# Patient Record
Sex: Female | Born: 1970 | Race: White | Hispanic: No | Marital: Married | Smoking: Never smoker
Health system: Southern US, Community
[De-identification: ages and names within clinical notes are randomized; demographics above are authoritative.]

## PROBLEM LIST (undated history)

## (undated) DIAGNOSIS — I10 Essential (primary) hypertension: Secondary | ICD-10-CM

## (undated) DIAGNOSIS — I471 Supraventricular tachycardia, unspecified: Secondary | ICD-10-CM

## (undated) DIAGNOSIS — G901 Familial dysautonomia [Riley-Day]: Secondary | ICD-10-CM

## (undated) DIAGNOSIS — F32A Depression, unspecified: Secondary | ICD-10-CM

## (undated) DIAGNOSIS — I442 Atrioventricular block, complete: Secondary | ICD-10-CM

## (undated) DIAGNOSIS — F329 Major depressive disorder, single episode, unspecified: Secondary | ICD-10-CM

## (undated) DIAGNOSIS — G4700 Insomnia, unspecified: Secondary | ICD-10-CM

## (undated) DIAGNOSIS — F429 Obsessive-compulsive disorder, unspecified: Secondary | ICD-10-CM

## (undated) DIAGNOSIS — Z950 Presence of cardiac pacemaker: Secondary | ICD-10-CM

## (undated) DIAGNOSIS — R0600 Dyspnea, unspecified: Secondary | ICD-10-CM

## (undated) HISTORY — DX: Dyspnea, unspecified: R06.00

## (undated) HISTORY — PX: INSERT / REPLACE / REMOVE PACEMAKER: SUR710

## (undated) HISTORY — DX: Supraventricular tachycardia, unspecified: I47.10

## (undated) HISTORY — DX: Obsessive-compulsive disorder, unspecified: F42.9

## (undated) HISTORY — DX: Essential (primary) hypertension: I10

## (undated) HISTORY — DX: Insomnia, unspecified: G47.00

## (undated) HISTORY — PX: BREAST LUMPECTOMY: SHX2

## (undated) HISTORY — DX: Familial dysautonomia (riley-day): G90.1

## (undated) HISTORY — DX: Supraventricular tachycardia: I47.1

## (undated) HISTORY — DX: Major depressive disorder, single episode, unspecified: F32.9

## (undated) HISTORY — DX: Depression, unspecified: F32.A

---
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
## 1996-06-03 HISTORY — PX: BREAST BIOPSY: SHX20

## 1998-03-01 ENCOUNTER — Inpatient Hospital Stay (HOSPITAL_COMMUNITY): Admission: AD | Admit: 1998-03-01 | Discharge: 1998-03-04 | Payer: Self-pay | Admitting: Gynecology

## 1998-04-19 ENCOUNTER — Other Ambulatory Visit: Admission: RE | Admit: 1998-04-19 | Discharge: 1998-04-19 | Payer: Self-pay | Admitting: Obstetrics and Gynecology

## 1999-05-01 ENCOUNTER — Other Ambulatory Visit: Admission: RE | Admit: 1999-05-01 | Discharge: 1999-05-01 | Payer: Self-pay | Admitting: Obstetrics and Gynecology

## 2000-04-14 ENCOUNTER — Other Ambulatory Visit: Admission: RE | Admit: 2000-04-14 | Discharge: 2000-04-14 | Payer: Self-pay | Admitting: *Deleted

## 2001-03-06 ENCOUNTER — Inpatient Hospital Stay (HOSPITAL_COMMUNITY): Admission: AD | Admit: 2001-03-06 | Discharge: 2001-03-06 | Payer: Self-pay | Admitting: Gynecology

## 2001-03-07 ENCOUNTER — Inpatient Hospital Stay (HOSPITAL_COMMUNITY): Admission: AD | Admit: 2001-03-07 | Discharge: 2001-03-07 | Payer: Self-pay | Admitting: *Deleted

## 2001-03-09 ENCOUNTER — Inpatient Hospital Stay (HOSPITAL_COMMUNITY): Admission: AD | Admit: 2001-03-09 | Discharge: 2001-03-11 | Payer: Self-pay | Admitting: Gynecology

## 2001-04-29 ENCOUNTER — Other Ambulatory Visit: Admission: RE | Admit: 2001-04-29 | Discharge: 2001-04-29 | Payer: Self-pay | Admitting: Gynecology

## 2001-06-03 HISTORY — PX: TUBAL LIGATION: SHX77

## 2001-06-18 ENCOUNTER — Ambulatory Visit (HOSPITAL_COMMUNITY): Admission: RE | Admit: 2001-06-18 | Discharge: 2001-06-18 | Payer: Self-pay | Admitting: Gynecology

## 2001-06-29 ENCOUNTER — Inpatient Hospital Stay (HOSPITAL_COMMUNITY): Admission: AD | Admit: 2001-06-29 | Discharge: 2001-06-29 | Payer: Self-pay | Admitting: *Deleted

## 2002-06-03 HISTORY — PX: BREAST BIOPSY: SHX20

## 2002-06-24 ENCOUNTER — Other Ambulatory Visit: Admission: RE | Admit: 2002-06-24 | Discharge: 2002-06-24 | Payer: Self-pay | Admitting: Gynecology

## 2003-06-04 HISTORY — PX: ENDOMETRIAL ABLATION: SHX621

## 2003-06-04 HISTORY — PX: OTHER SURGICAL HISTORY: SHX169

## 2003-07-11 ENCOUNTER — Other Ambulatory Visit: Admission: RE | Admit: 2003-07-11 | Discharge: 2003-07-11 | Payer: Self-pay | Admitting: Gynecology

## 2003-10-04 ENCOUNTER — Other Ambulatory Visit: Payer: Self-pay

## 2004-03-12 ENCOUNTER — Observation Stay (HOSPITAL_COMMUNITY): Admission: RE | Admit: 2004-03-12 | Discharge: 2004-03-13 | Payer: Self-pay | Admitting: Gynecology

## 2004-07-16 ENCOUNTER — Other Ambulatory Visit: Admission: RE | Admit: 2004-07-16 | Discharge: 2004-07-16 | Payer: Self-pay | Admitting: Gynecology

## 2005-06-03 HISTORY — PX: INSERT / REPLACE / REMOVE PACEMAKER: SUR710

## 2005-07-17 ENCOUNTER — Emergency Department: Payer: Self-pay | Admitting: Emergency Medicine

## 2005-07-17 ENCOUNTER — Other Ambulatory Visit: Admission: RE | Admit: 2005-07-17 | Discharge: 2005-07-17 | Payer: Self-pay | Admitting: Gynecology

## 2005-08-13 ENCOUNTER — Ambulatory Visit: Payer: Self-pay | Admitting: Internal Medicine

## 2005-08-14 ENCOUNTER — Inpatient Hospital Stay (HOSPITAL_COMMUNITY): Admission: AD | Admit: 2005-08-14 | Discharge: 2005-08-20 | Payer: Self-pay | Admitting: Internal Medicine

## 2005-08-14 ENCOUNTER — Ambulatory Visit: Payer: Self-pay | Admitting: Internal Medicine

## 2005-08-15 IMAGING — CR DG CHEST 1V PORT
1 series · 1 of 1 positions shown · non-contrast
Comparison: none

CLINICAL DATA: Irregular heartbeat. 
 PORTABLE CHEST ? 1 VIEW ? [DATE] AT [F2] HOURS:

[view not recorded]
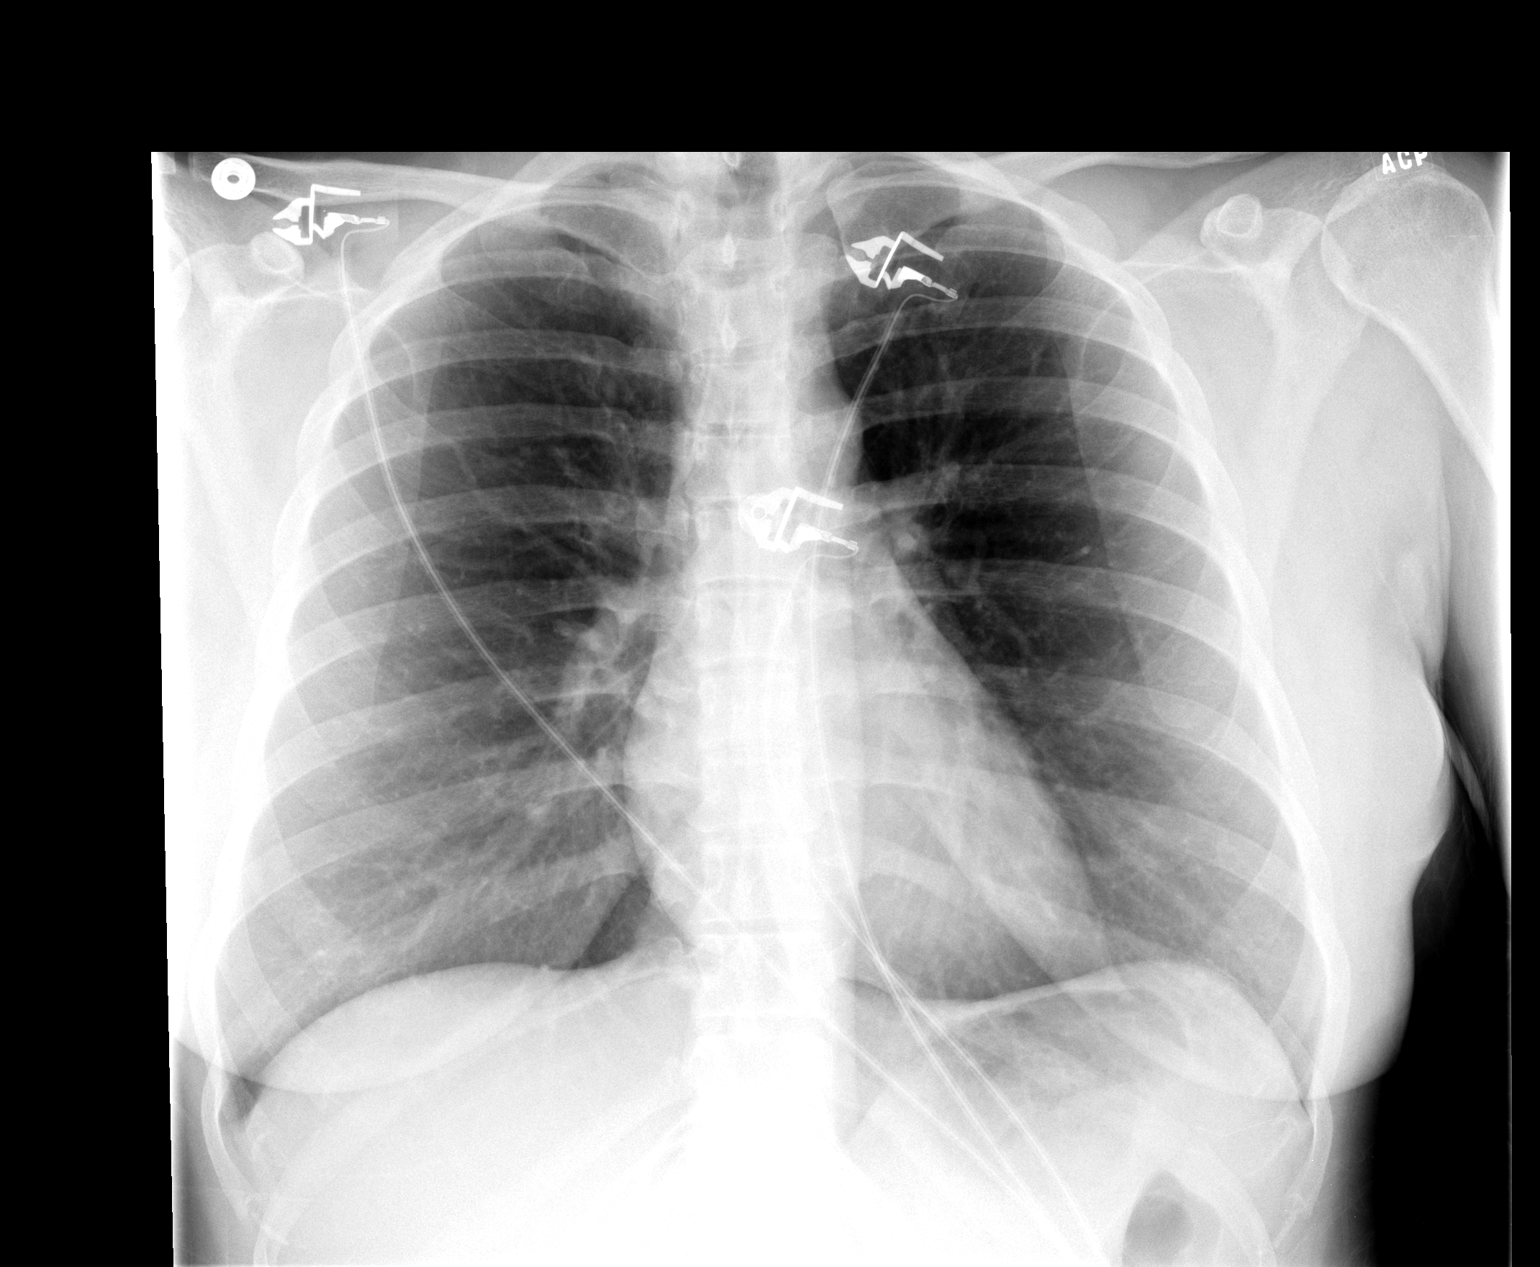

[1 of 1 positions shown; findings below may reference images not displayed]

FINDINGS: The lungs are clear.  The heart is within normal limits in size.  No bony abnormality is seen.
IMPRESSION: No active lung disease.

## 2005-08-19 IMAGING — CR DG CHEST 2V
2 series · 2 of 2 positions shown · non-contrast
Comparison: [DATE]
 There is COPD with hyperinflation.

CLINICAL DATA: Pacemaker insertion.
 [OO] VIEWS:

[view not recorded (1 of 2)]
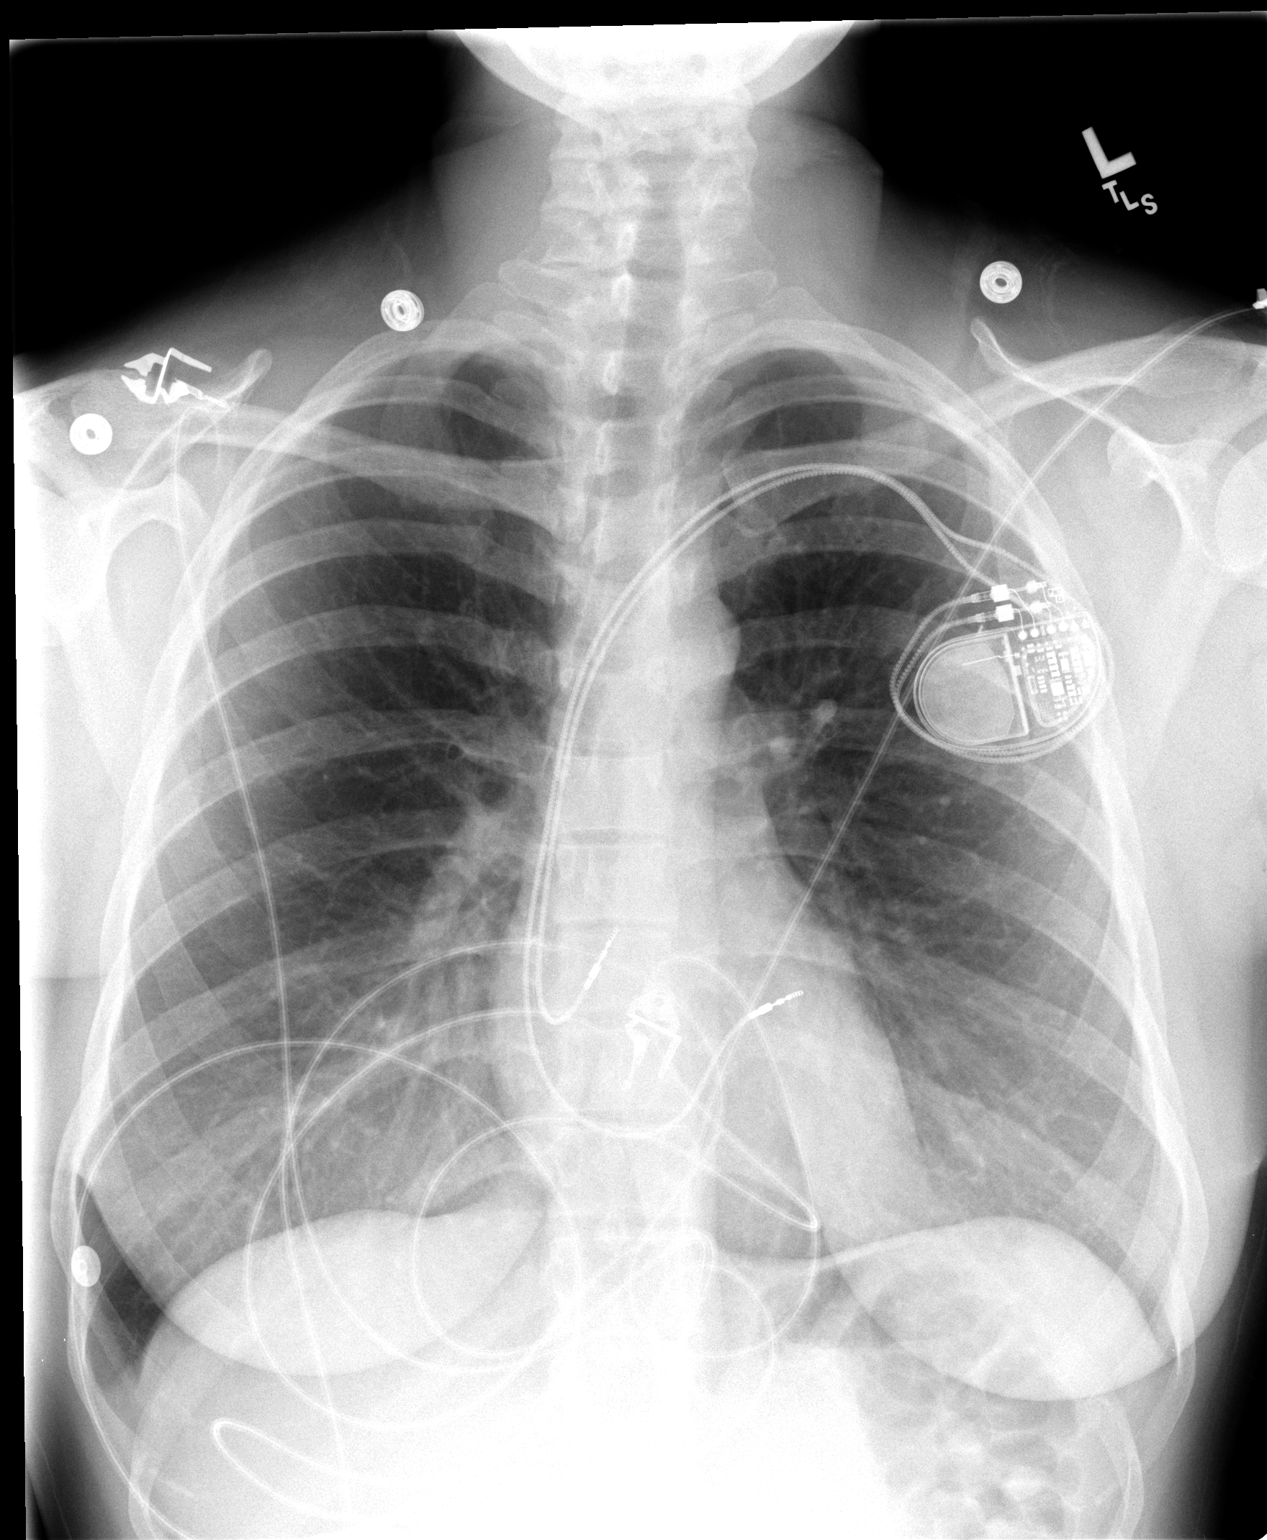

[view not recorded (2 of 2)]
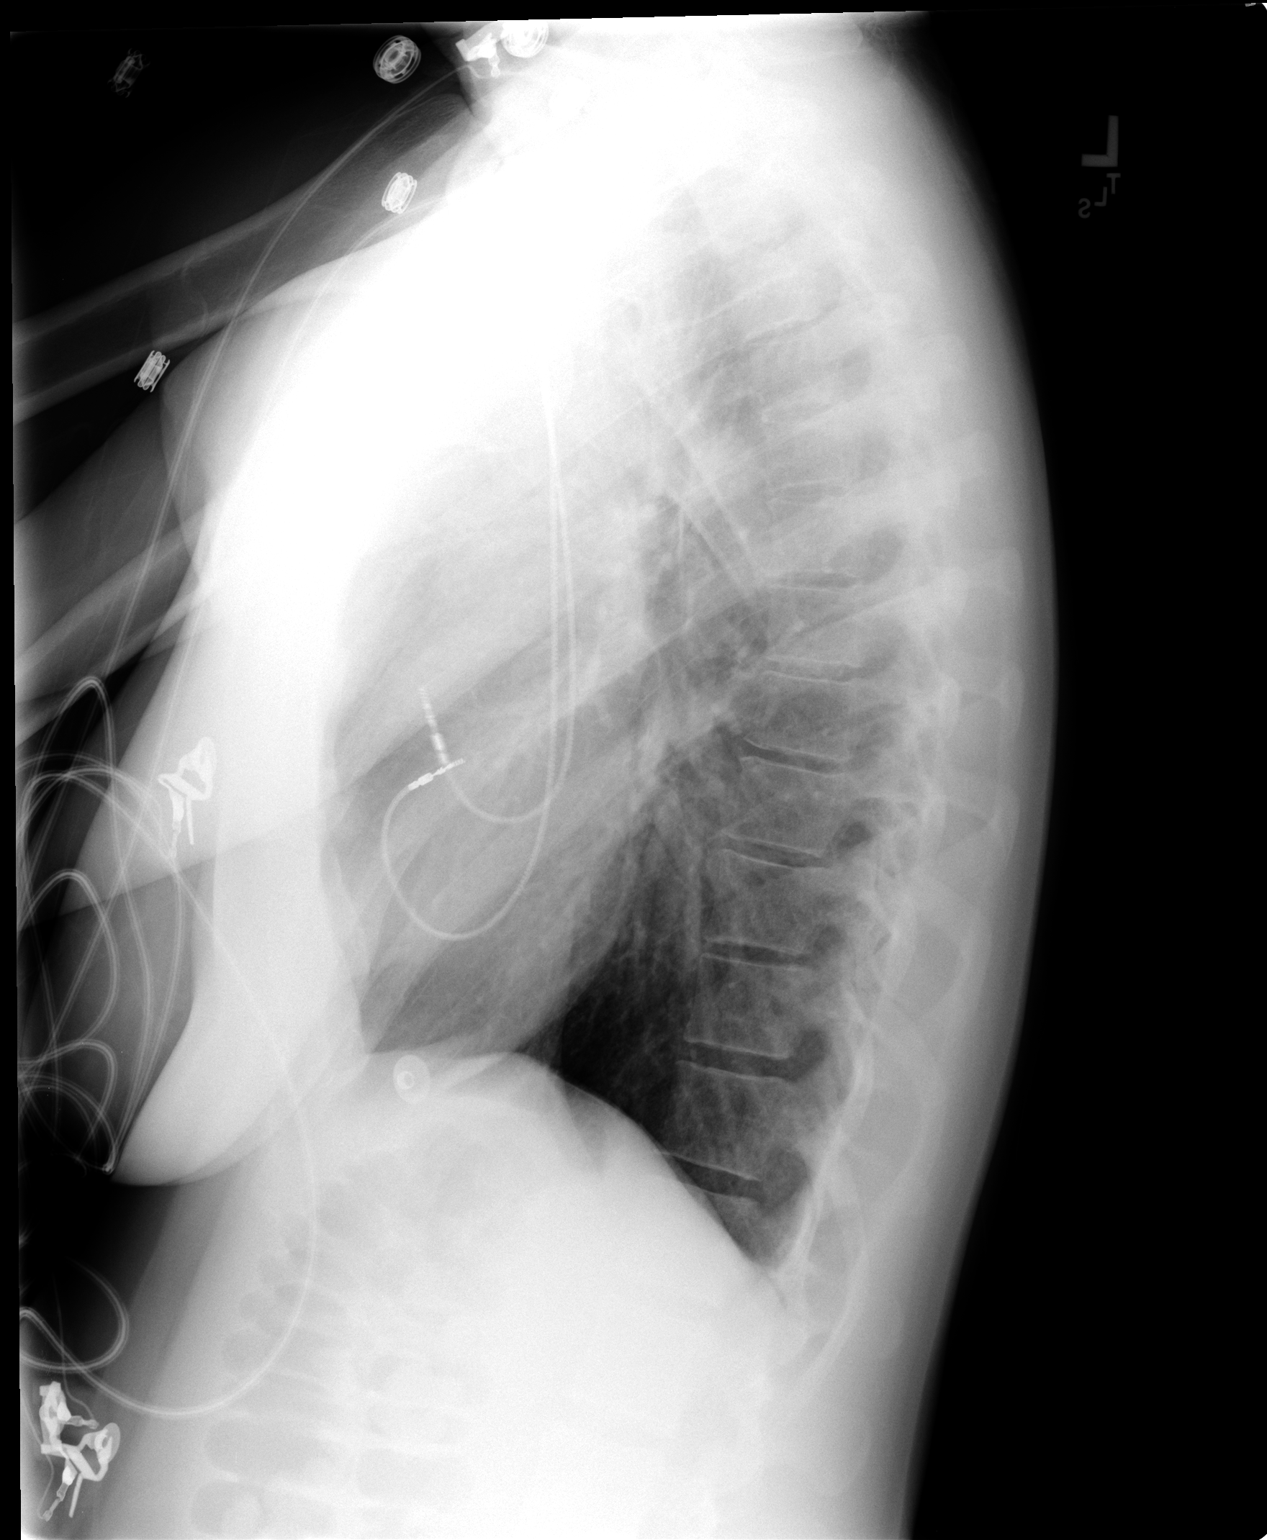

[2 of 2 positions shown; findings below may reference images not displayed]

There is no heart failure, infiltrate or effusion.  Dual lead pacemaker is in place.
IMPRESSION: No acute abnormality.

## 2005-08-29 ENCOUNTER — Ambulatory Visit: Payer: Self-pay | Admitting: Internal Medicine

## 2005-09-20 ENCOUNTER — Ambulatory Visit: Payer: Self-pay

## 2005-10-24 ENCOUNTER — Ambulatory Visit: Payer: Self-pay | Admitting: Internal Medicine

## 2005-12-10 ENCOUNTER — Ambulatory Visit: Payer: Self-pay | Admitting: Internal Medicine

## 2005-12-12 ENCOUNTER — Ambulatory Visit: Payer: Self-pay | Admitting: Internal Medicine

## 2005-12-23 ENCOUNTER — Ambulatory Visit: Payer: Self-pay | Admitting: Internal Medicine

## 2005-12-23 ENCOUNTER — Ambulatory Visit: Payer: Self-pay | Admitting: Cardiology

## 2005-12-23 IMAGING — CT CT ANGIO CHEST
2 of 5 series · 18 of 36 positions shown · IV contrast (APPLIED)
Comparison: Chest x-ray [DATE].

CLINICAL DATA: Shortness of breath.  Substernal chest pain.  
 CT ANGIOGRAPHY OF CHEST:
TECHNIQUE: Multidetector CT imaging of the chest was performed during bolus injection of intravenous contrast.  Multiplanar CT angiographic image reconstructions were generated to evaluate the vascular anatomy.
 Contrast:  80 cc Omnipaque 300

[Series 5: pe 1.0 b20f st · axial · 0.69mm/px · z∈[-273,-38]mm · 15 of 269 slices shown]
[im 17/269  lung]
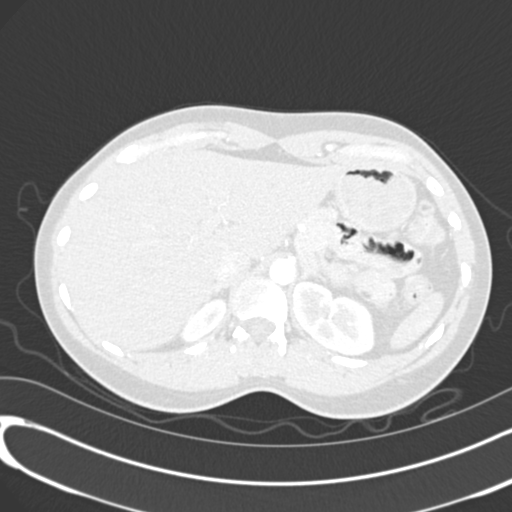
[im 34/269  mediastinal]
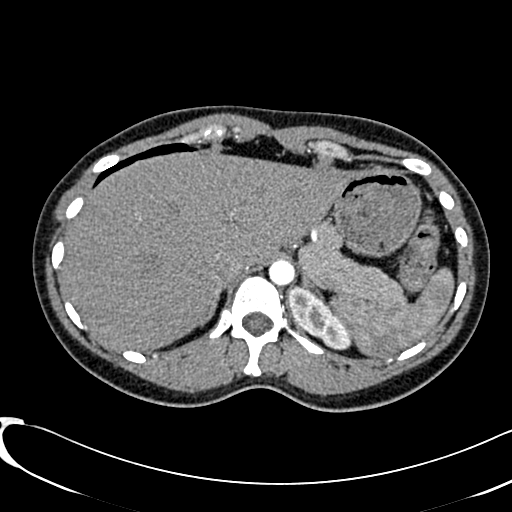
[im 51/269  lung]
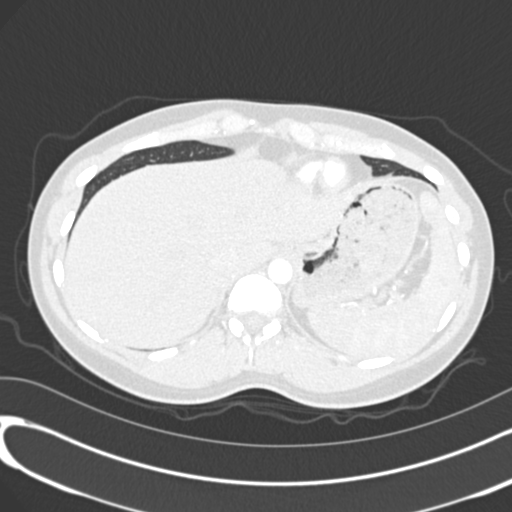
[im 68/269  mediastinal]
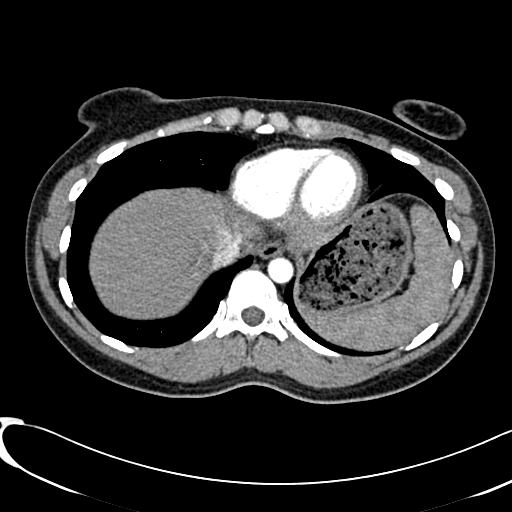
[im 84/269  lung]
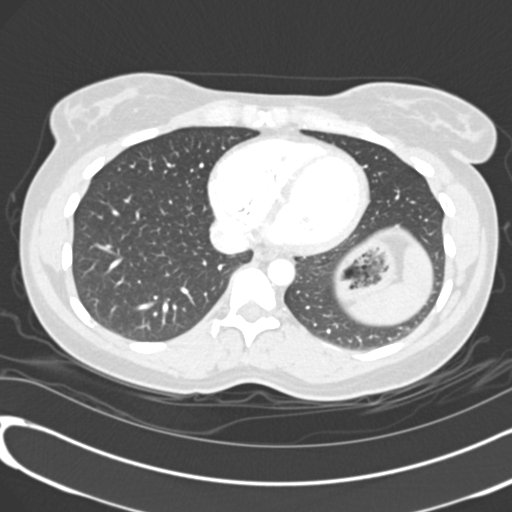
[im 101/269  mediastinal]
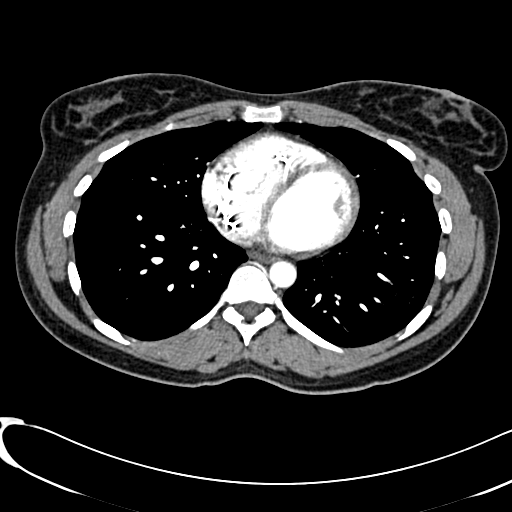
[im 118/269  lung]
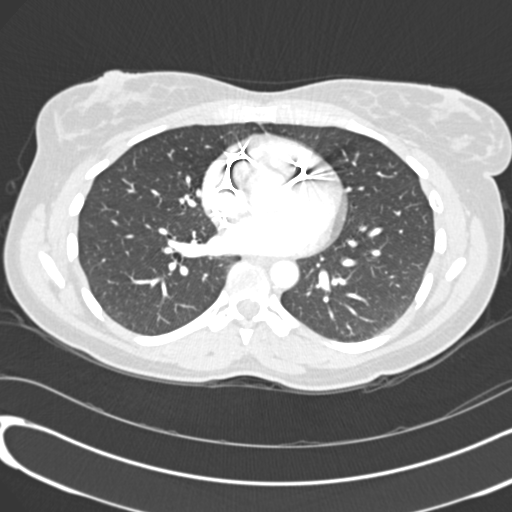
[im 135/269  mediastinal]
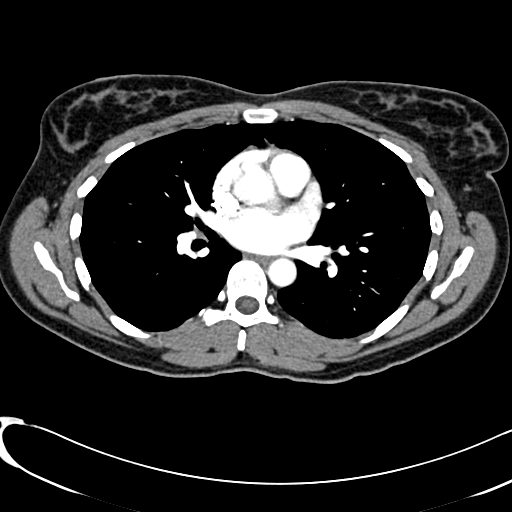
[im 151/269  lung]
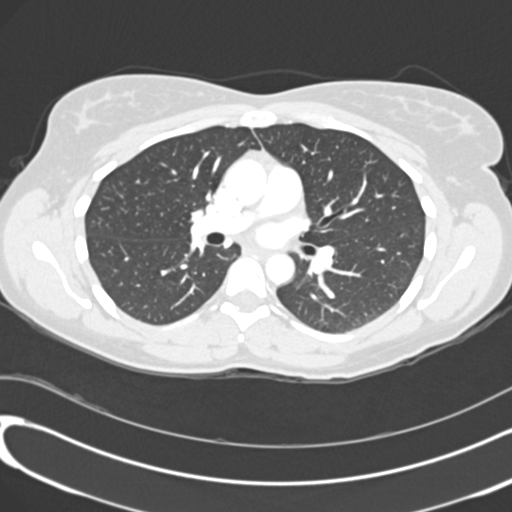
[im 168/269  mediastinal]
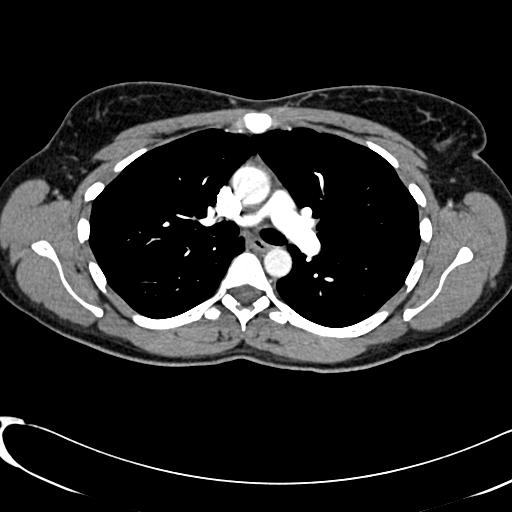
[im 185/269  lung]
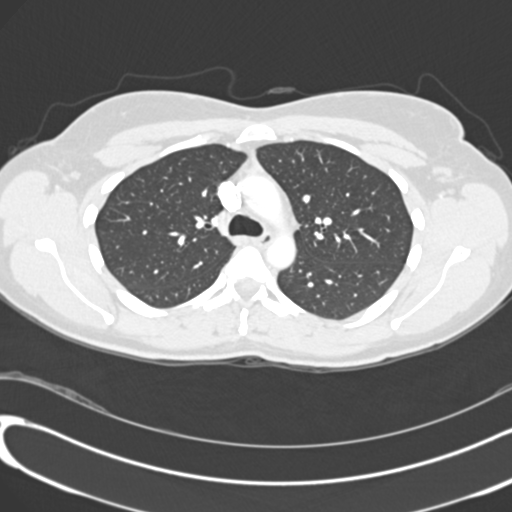
[im 202/269  mediastinal]
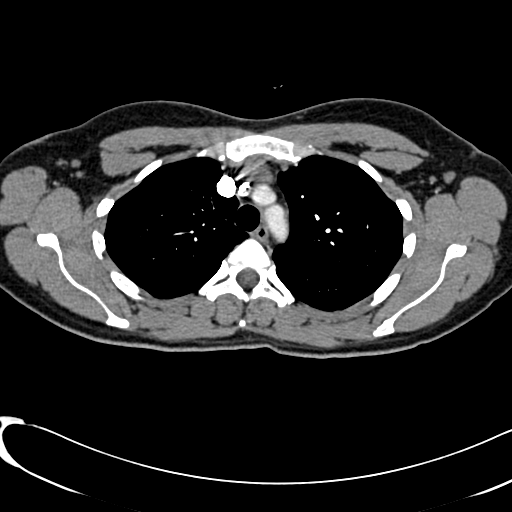
[im 218/269  lung]
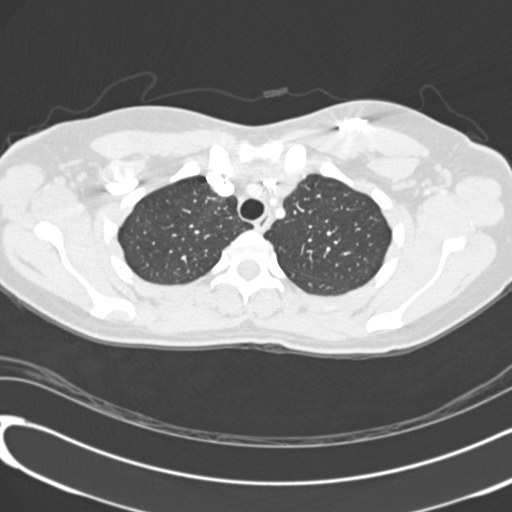
[im 235/269  mediastinal]
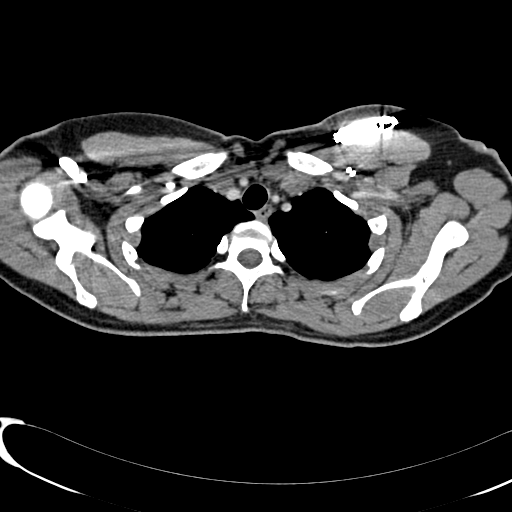
[im 252/269  lung]
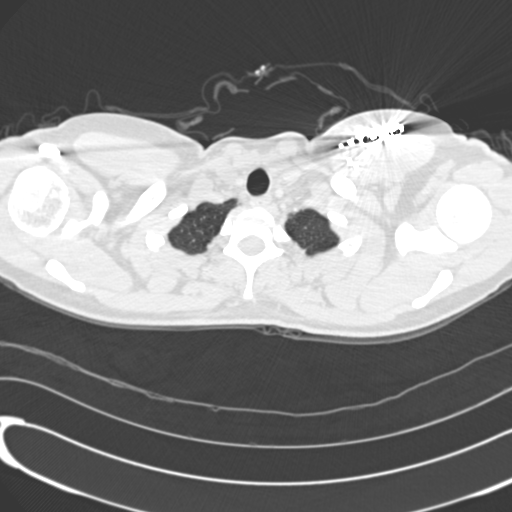

[Series 602: <mpr thick range> · coronal · 0.69mm/px · 3 of 39 slices shown]
[im 8/39  mediastinal]
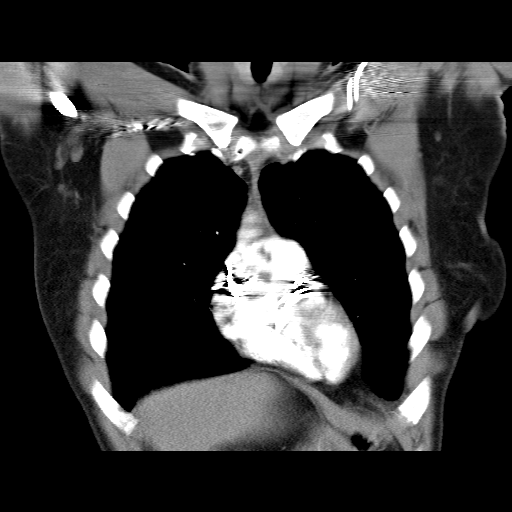
[im 16/39  mediastinal]
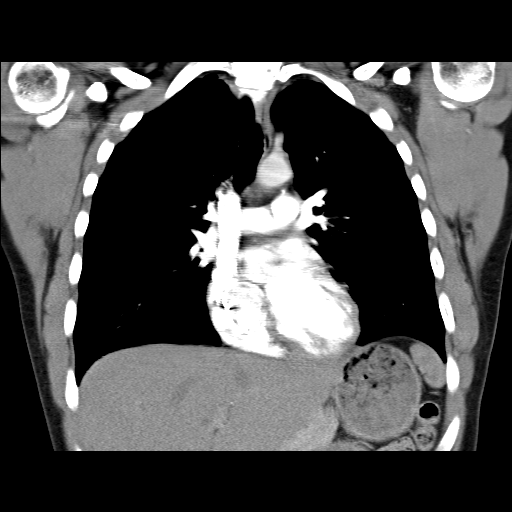
[im 23/39  mediastinal]
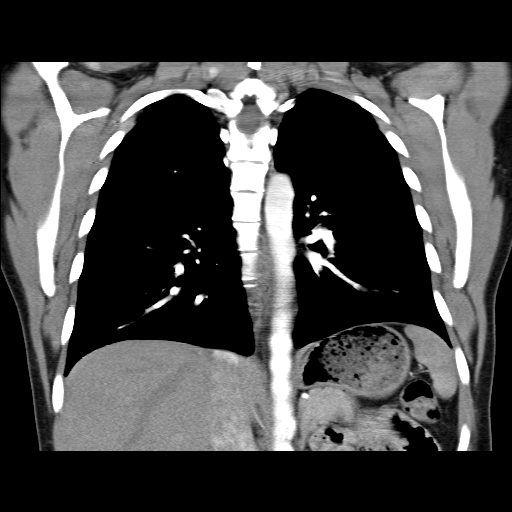

[18 of 36 positions shown; findings below may reference images not displayed]

FINDINGS: No CT evidence for pulmonary embolus.  No pathologically enlarged mediastinal, hilar, or axillary lymph nodes.  Heart size within normal limits.  No pericardial fluid.  
 No pleural fluid.  Minimal biapical scarring.  There are a few scattered tiny pulmonary nodules which are nonspecific.  Airway is unremarkable. 
 Incidental imaging of the upper abdomen shows subcentimeter low density lesions in the liver which are too small to characterize.
IMPRESSION: 1.  No pulmonary embolism.   No findings to account for patient?s given symptoms. 
 2.  Subcentimeter low attenuation lesions in the liver are too small to characterize.

## 2006-01-09 ENCOUNTER — Encounter: Admission: RE | Admit: 2006-01-09 | Discharge: 2006-01-09 | Payer: Self-pay | Admitting: General Surgery

## 2006-01-09 IMAGING — MG MM DIAGNOSTIC UNILATERAL R
2 series · 2 of 2 positions shown · non-contrast
Comparison: none

DG DIAGNOSTIC UNILATERAL R
CC and MLO view(s) were taken of the right breast.

RIGHT BREAST ULTRASOUND
DIGITAL UNILATERAL RIGHT DIAGNOSTIC MAMMOGRAM WITH CAD  AND RIGHT BREAST ULTRASOUND:
CLINICAL DATA: Right breast pain.

[R CC]
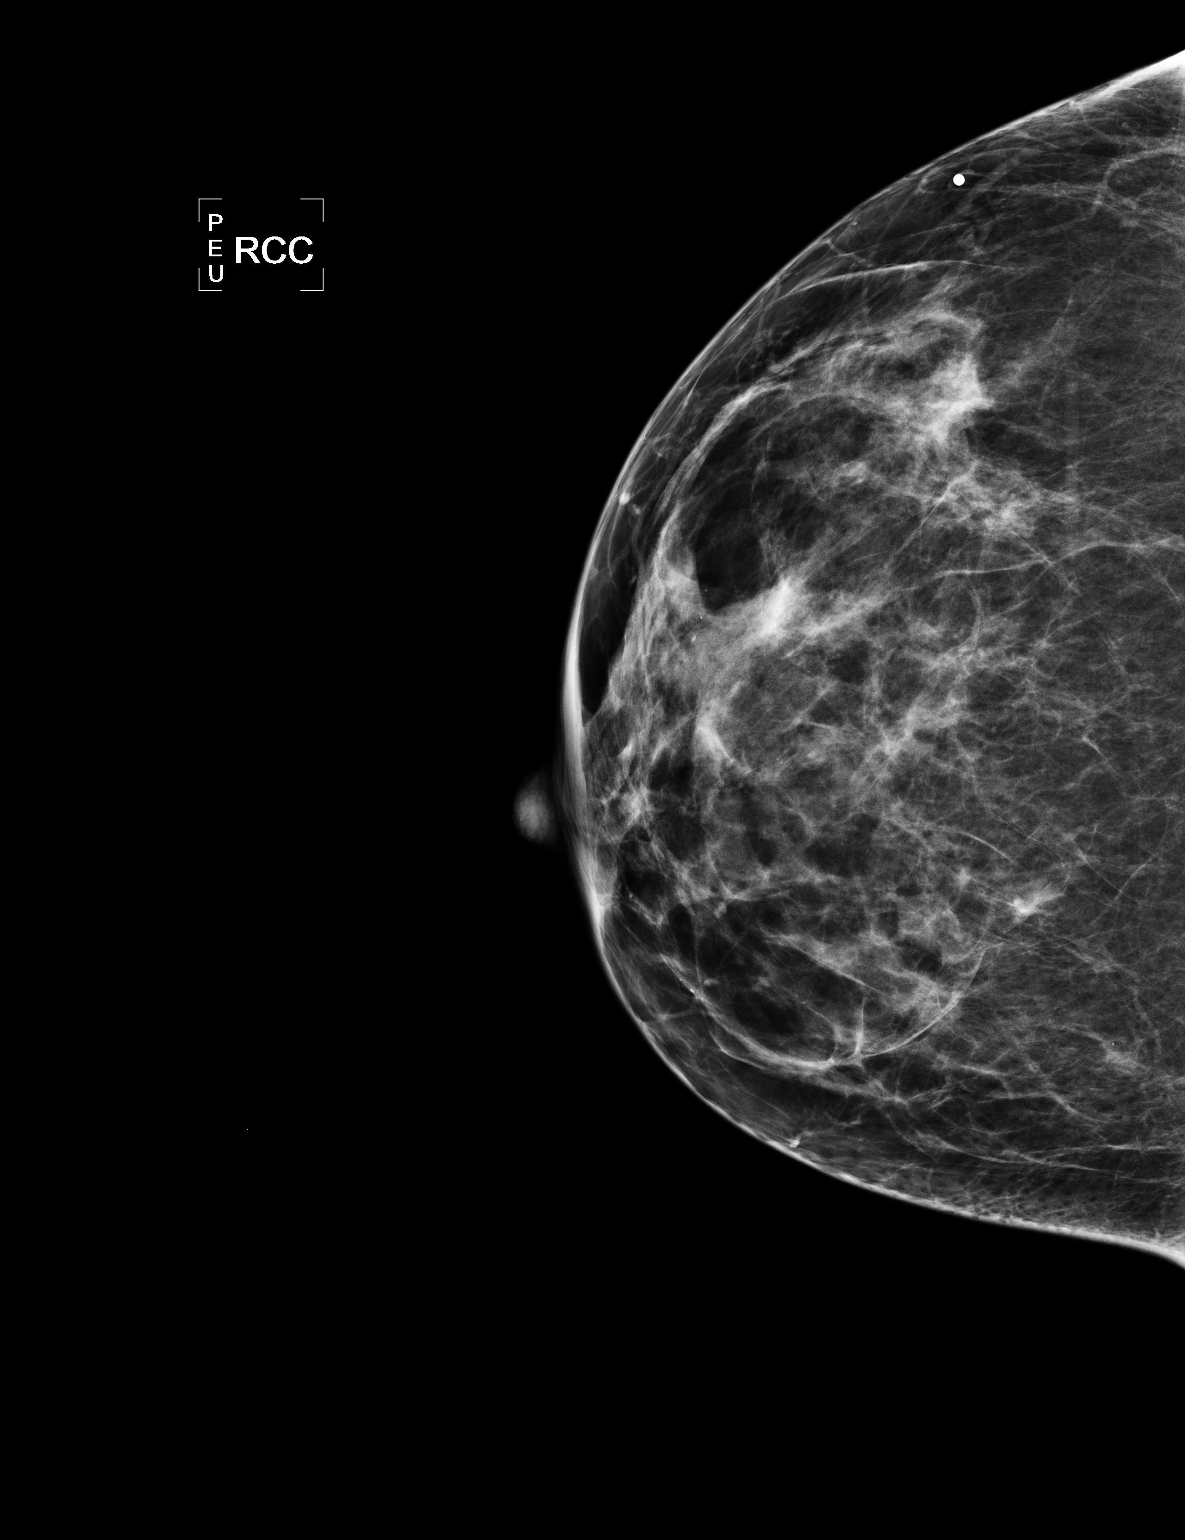

[R MLO]
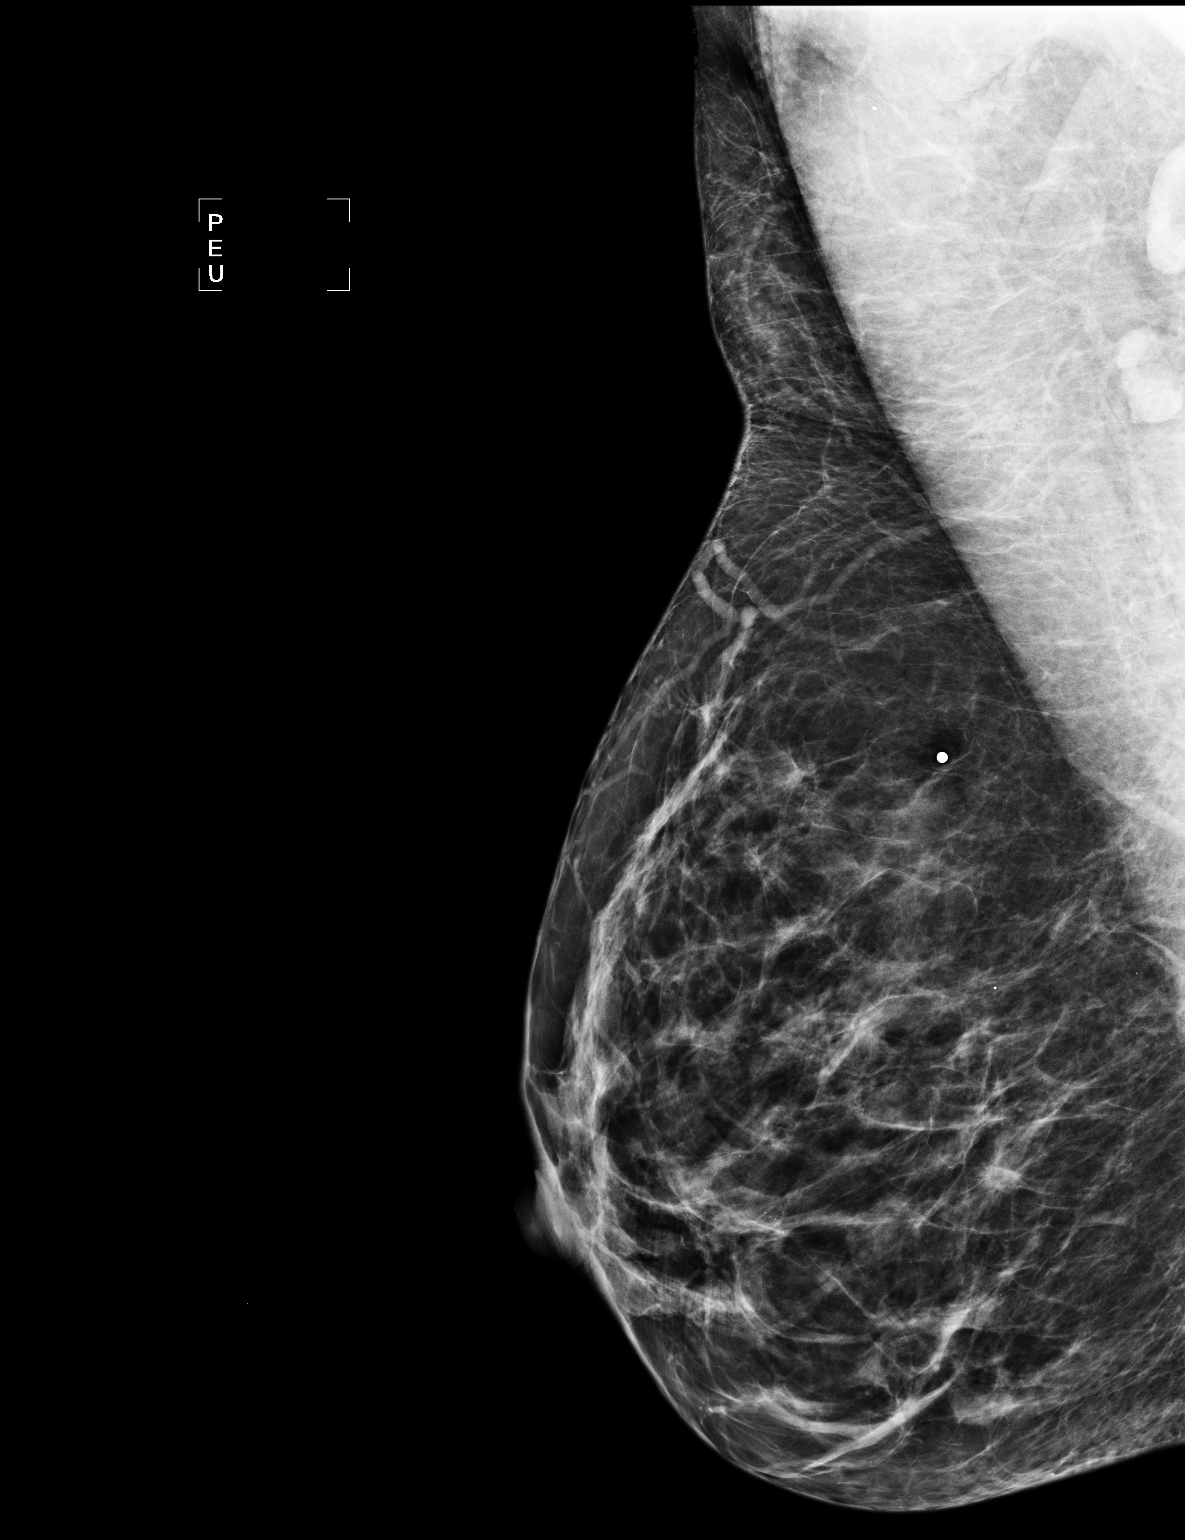

[2 of 2 positions shown; findings below may reference images not displayed]

Comparison study from [HOSPITAL] dated [DATE].  The fibroglandular parenchymal pattern 
is stable throughtout the right breast.  No dominant masses, suspicious calcifications, or areas of
architectural distortion are identified.

Physical exam of the right breast is normal today.  Ultrasound of the upper outer quadsrant in the 
patient's region of focal tenderness reveals normal, but dense fibroglandular tissue only.
IMPRESSION: There is no specific radiographic evidence of malignancy on the right.  Screening mammogram in 
[DATE] recommended.

ASSESSMENT: Negative - BI-RADS 1

Screening mammogram of both breasts in 6 months.
ANALYZED BY COMPUTER AIDED DETECTION. ,

## 2006-04-17 ENCOUNTER — Ambulatory Visit: Payer: Self-pay | Admitting: Internal Medicine

## 2006-06-17 ENCOUNTER — Ambulatory Visit: Payer: Self-pay | Admitting: Internal Medicine

## 2006-07-21 ENCOUNTER — Encounter: Admission: RE | Admit: 2006-07-21 | Discharge: 2006-07-21 | Payer: Self-pay | Admitting: Gynecology

## 2006-09-01 ENCOUNTER — Ambulatory Visit: Payer: Self-pay | Admitting: Internal Medicine

## 2006-09-12 ENCOUNTER — Other Ambulatory Visit: Admission: RE | Admit: 2006-09-12 | Discharge: 2006-09-12 | Payer: Self-pay | Admitting: Gynecology

## 2006-11-20 IMAGING — CR DG CHEST 2V
2 series · 2 of 2 positions shown · non-contrast
Comparison: [DATE].

CLINICAL DATA: Right breast mass.  Preoperative respiratory exam. 
 CHEST - 2 VIEW:

[view not recorded (1 of 2)]
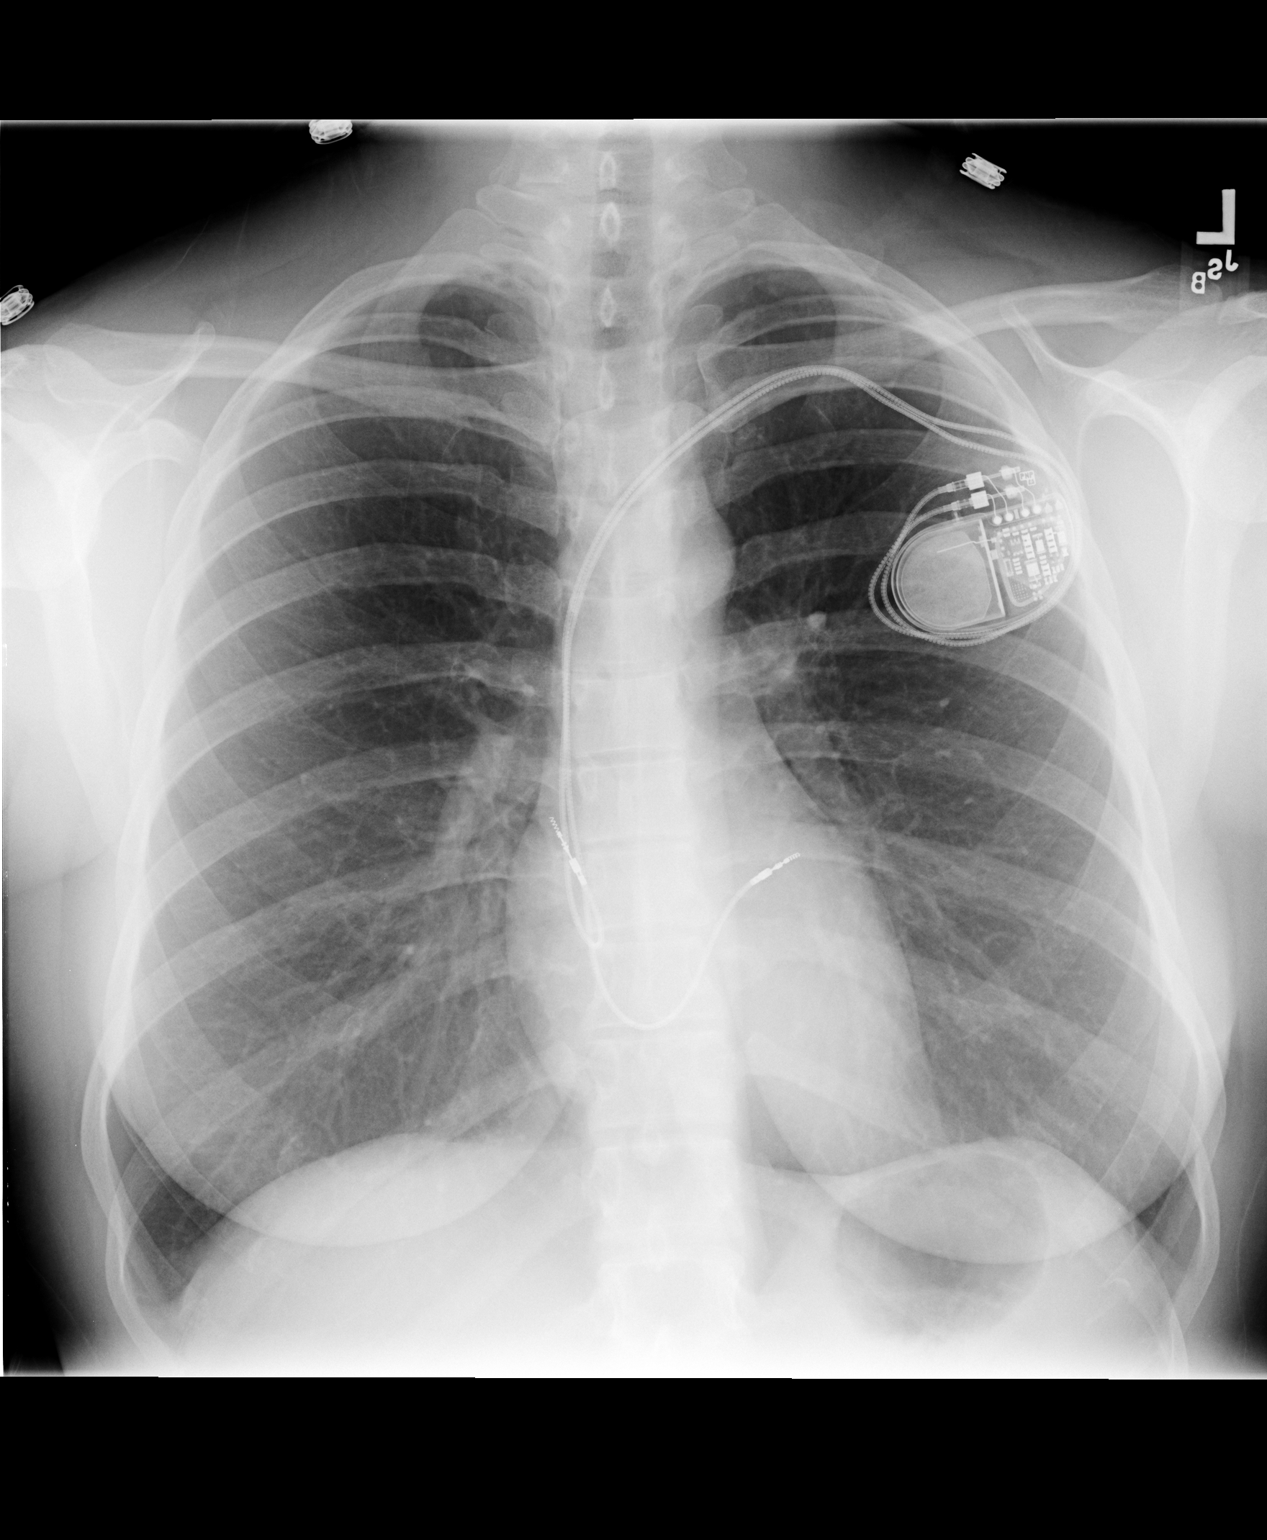

[view not recorded (2 of 2)]
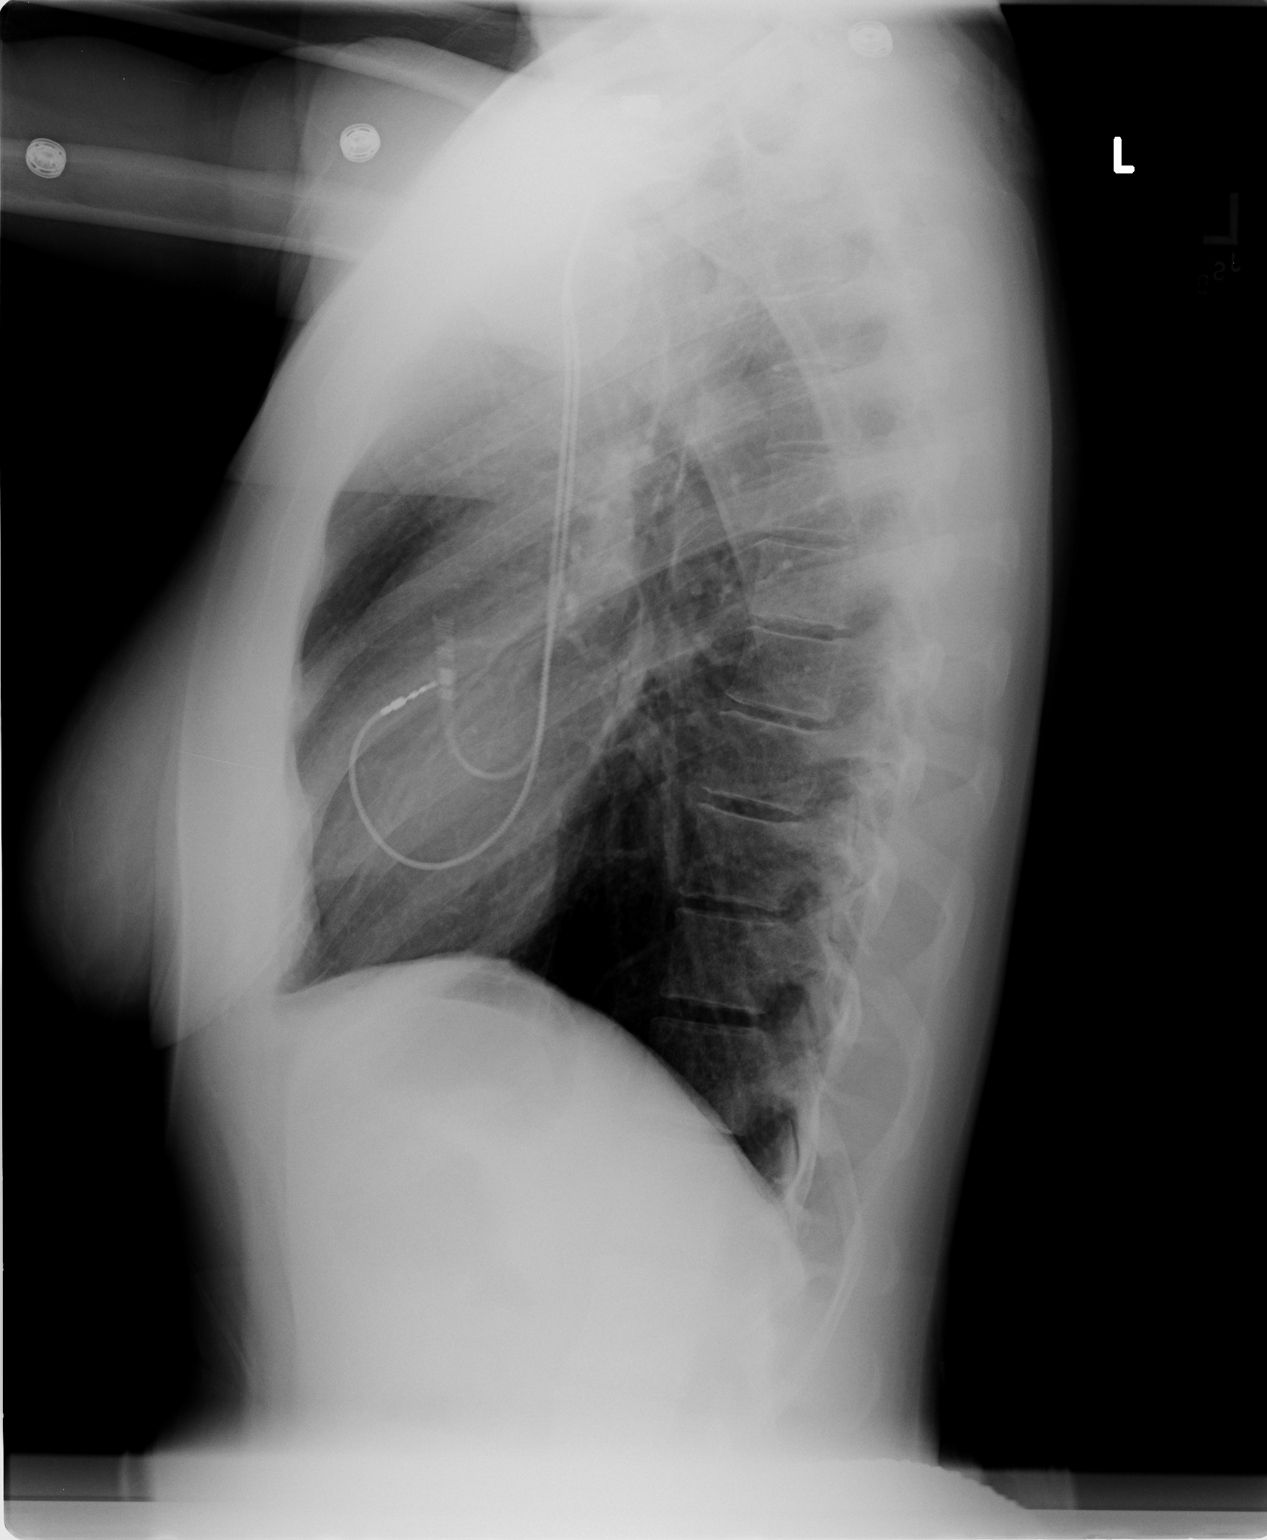

[2 of 2 positions shown; findings below may reference images not displayed]

FINDINGS: The heart size is normal.   Both lungs are clear.   There is no evidence of pleural effusion.  There is no evidence of a mass or adenopathy.  A dual-lead transvenous pacemaker is seen with leads in the right atrium and right ventricular outflow tract which are stable in position.
IMPRESSION: Stable chest.  No active disease.

## 2006-11-24 ENCOUNTER — Encounter (INDEPENDENT_AMBULATORY_CARE_PROVIDER_SITE_OTHER): Payer: Self-pay | Admitting: General Surgery

## 2006-11-24 ENCOUNTER — Ambulatory Visit (HOSPITAL_COMMUNITY): Admission: RE | Admit: 2006-11-24 | Discharge: 2006-11-24 | Payer: Self-pay | Admitting: General Surgery

## 2006-11-26 ENCOUNTER — Ambulatory Visit: Payer: Self-pay | Admitting: Internal Medicine

## 2007-02-13 ENCOUNTER — Ambulatory Visit: Payer: Self-pay | Admitting: Internal Medicine

## 2007-05-13 ENCOUNTER — Ambulatory Visit: Payer: Self-pay | Admitting: Internal Medicine

## 2007-05-14 ENCOUNTER — Ambulatory Visit: Payer: Self-pay | Admitting: Internal Medicine

## 2007-08-12 ENCOUNTER — Ambulatory Visit: Payer: Self-pay | Admitting: Internal Medicine

## 2007-11-11 ENCOUNTER — Ambulatory Visit: Payer: Self-pay | Admitting: Internal Medicine

## 2008-01-21 ENCOUNTER — Other Ambulatory Visit: Admission: RE | Admit: 2008-01-21 | Discharge: 2008-01-21 | Payer: Self-pay | Admitting: Gynecology

## 2008-02-15 ENCOUNTER — Ambulatory Visit: Payer: Self-pay | Admitting: Internal Medicine

## 2008-05-18 ENCOUNTER — Ambulatory Visit: Payer: Self-pay | Admitting: Internal Medicine

## 2008-06-21 ENCOUNTER — Ambulatory Visit: Payer: Self-pay | Admitting: Gastroenterology

## 2008-07-05 ENCOUNTER — Other Ambulatory Visit: Admission: RE | Admit: 2008-07-05 | Discharge: 2008-07-05 | Payer: Self-pay | Admitting: Gynecology

## 2008-07-05 ENCOUNTER — Ambulatory Visit: Payer: Self-pay | Admitting: Gynecology

## 2008-07-05 ENCOUNTER — Encounter: Payer: Self-pay | Admitting: Gynecology

## 2008-07-06 ENCOUNTER — Ambulatory Visit: Payer: Self-pay | Admitting: Gynecology

## 2008-07-15 ENCOUNTER — Ambulatory Visit: Payer: Self-pay | Admitting: Internal Medicine

## 2008-07-15 LAB — CONVERTED CEMR LAB
CO2: 25 meq/L (ref 19–32)
Hemoglobin: 14.2 g/dL (ref 12.0–15.0)
MCHC: 33.5 g/dL (ref 30.0–36.0)
MCV: 90 fL (ref 78.0–100.0)
Platelets: 288 10*3/uL (ref 150–400)
RBC: 4.71 M/uL (ref 3.87–5.11)
RDW: 11.8 % (ref 11.5–15.5)
WBC: 6.6 10*3/uL (ref 4.0–10.5)

## 2008-07-20 ENCOUNTER — Ambulatory Visit: Payer: Self-pay

## 2008-07-25 ENCOUNTER — Ambulatory Visit: Payer: Self-pay | Admitting: Cardiology

## 2008-07-25 ENCOUNTER — Encounter: Payer: Self-pay | Admitting: Internal Medicine

## 2008-07-25 LAB — CONVERTED CEMR LAB
CO2: 26 meq/L (ref 19–32)
Calcium: 9.3 mg/dL (ref 8.4–10.5)
Chloride: 103 meq/L (ref 96–112)
HCT: 42.3 % (ref 36.0–46.0)
MCV: 90.6 fL (ref 78.0–100.0)
Potassium: 4 meq/L (ref 3.5–5.3)
Prothrombin Time: 13.7 s (ref 11.6–15.2)
RBC: 4.67 M/uL (ref 3.87–5.11)
Sodium: 140 meq/L (ref 135–145)
WBC: 7.8 10*3/uL (ref 4.0–10.5)

## 2008-07-29 ENCOUNTER — Ambulatory Visit: Payer: Self-pay | Admitting: Internal Medicine

## 2008-07-29 ENCOUNTER — Inpatient Hospital Stay (HOSPITAL_BASED_OUTPATIENT_CLINIC_OR_DEPARTMENT_OTHER): Admission: RE | Admit: 2008-07-29 | Discharge: 2008-07-29 | Payer: Self-pay | Admitting: Cardiology

## 2008-08-10 ENCOUNTER — Ambulatory Visit: Payer: Self-pay | Admitting: Internal Medicine

## 2008-08-10 ENCOUNTER — Encounter: Payer: Self-pay | Admitting: Internal Medicine

## 2008-08-10 DIAGNOSIS — M79609 Pain in unspecified limb: Secondary | ICD-10-CM

## 2008-08-18 ENCOUNTER — Encounter: Payer: Self-pay | Admitting: Internal Medicine

## 2008-08-25 ENCOUNTER — Ambulatory Visit: Payer: Self-pay | Admitting: Internal Medicine

## 2008-08-25 ENCOUNTER — Encounter: Payer: Self-pay | Admitting: Internal Medicine

## 2008-08-25 DIAGNOSIS — I442 Atrioventricular block, complete: Secondary | ICD-10-CM | POA: Insufficient documentation

## 2008-08-25 DIAGNOSIS — Z950 Presence of cardiac pacemaker: Secondary | ICD-10-CM | POA: Insufficient documentation

## 2008-11-23 ENCOUNTER — Ambulatory Visit: Payer: Self-pay | Admitting: Internal Medicine

## 2008-12-13 ENCOUNTER — Encounter: Payer: Self-pay | Admitting: Internal Medicine

## 2009-03-01 ENCOUNTER — Ambulatory Visit: Payer: Self-pay | Admitting: Internal Medicine

## 2009-03-16 ENCOUNTER — Telehealth: Payer: Self-pay | Admitting: Internal Medicine

## 2009-03-23 ENCOUNTER — Ambulatory Visit: Payer: Self-pay | Admitting: Internal Medicine

## 2009-03-23 ENCOUNTER — Encounter: Payer: Self-pay | Admitting: Internal Medicine

## 2009-03-23 DIAGNOSIS — R000 Tachycardia, unspecified: Secondary | ICD-10-CM

## 2009-03-23 DIAGNOSIS — Q078 Other specified congenital malformations of nervous system: Secondary | ICD-10-CM | POA: Insufficient documentation

## 2009-03-23 DIAGNOSIS — R030 Elevated blood-pressure reading, without diagnosis of hypertension: Secondary | ICD-10-CM | POA: Insufficient documentation

## 2009-03-29 ENCOUNTER — Telehealth (INDEPENDENT_AMBULATORY_CARE_PROVIDER_SITE_OTHER): Payer: Self-pay | Admitting: *Deleted

## 2009-03-29 LAB — CONVERTED CEMR LAB: Free T4: 0.8 ng/dL (ref 0.6–1.6)

## 2009-05-25 ENCOUNTER — Telehealth: Payer: Self-pay | Admitting: Internal Medicine

## 2009-05-31 ENCOUNTER — Encounter: Payer: Self-pay | Admitting: Internal Medicine

## 2009-05-31 ENCOUNTER — Ambulatory Visit: Payer: Self-pay | Admitting: Internal Medicine

## 2009-06-22 ENCOUNTER — Encounter: Payer: Self-pay | Admitting: Internal Medicine

## 2009-08-11 ENCOUNTER — Ambulatory Visit: Payer: Self-pay | Admitting: Gynecology

## 2009-08-11 ENCOUNTER — Other Ambulatory Visit: Admission: RE | Admit: 2009-08-11 | Discharge: 2009-08-11 | Payer: Self-pay | Admitting: Gynecology

## 2009-08-30 ENCOUNTER — Ambulatory Visit: Payer: Self-pay | Admitting: Internal Medicine

## 2009-09-04 ENCOUNTER — Encounter: Payer: Self-pay | Admitting: Internal Medicine

## 2009-10-11 ENCOUNTER — Telehealth: Payer: Self-pay | Admitting: Internal Medicine

## 2009-10-24 ENCOUNTER — Ambulatory Visit: Payer: Self-pay | Admitting: Cardiovascular Disease

## 2009-10-24 ENCOUNTER — Encounter: Payer: Self-pay | Admitting: Internal Medicine

## 2009-11-14 ENCOUNTER — Telehealth: Payer: Self-pay | Admitting: Internal Medicine

## 2009-11-27 ENCOUNTER — Ambulatory Visit: Payer: Self-pay

## 2009-11-27 ENCOUNTER — Ambulatory Visit: Payer: Self-pay | Admitting: Internal Medicine

## 2010-03-07 ENCOUNTER — Encounter: Payer: Self-pay | Admitting: Internal Medicine

## 2010-03-08 ENCOUNTER — Encounter: Payer: Self-pay | Admitting: Internal Medicine

## 2010-04-09 ENCOUNTER — Ambulatory Visit: Payer: Self-pay | Admitting: Internal Medicine

## 2010-04-09 ENCOUNTER — Ambulatory Visit: Payer: Self-pay | Admitting: Family Medicine

## 2010-04-09 IMAGING — US ABDOMEN ULTRASOUND LIMITED
1 series · 17 of 25 positions shown · non-contrast
Comparison: none

REASON FOR EXAM: abd pain
COMMENTS:

[Series 1: abdomen ultrasound limited · 17 of 79 slices shown]
[im 1/79]
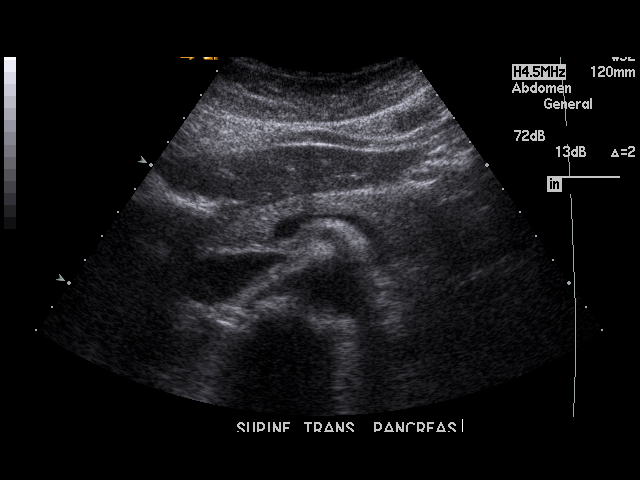
[im 7/79]
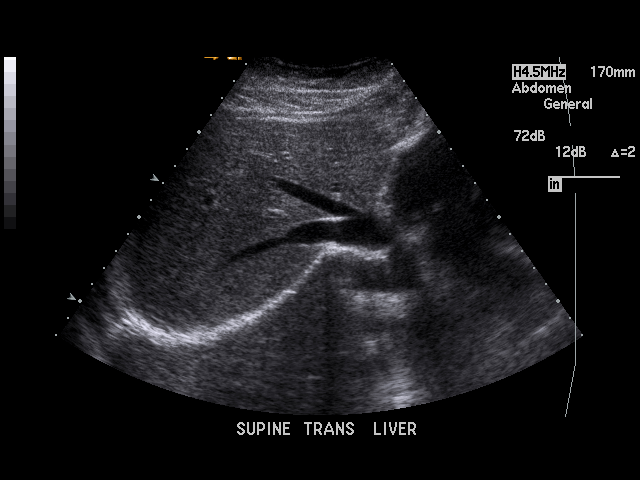
[im 10/79]
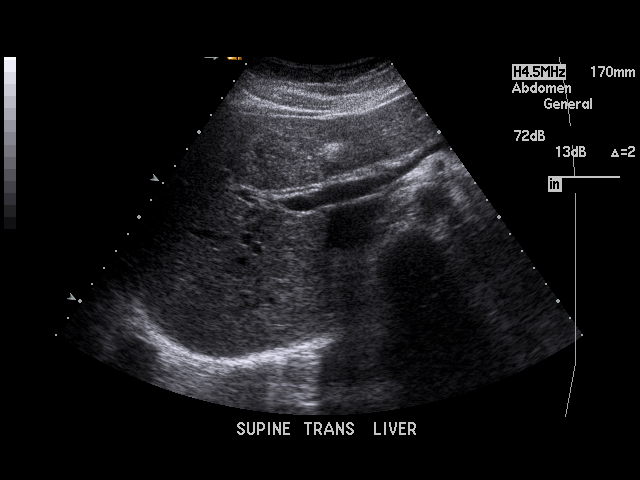
[im 17/79]
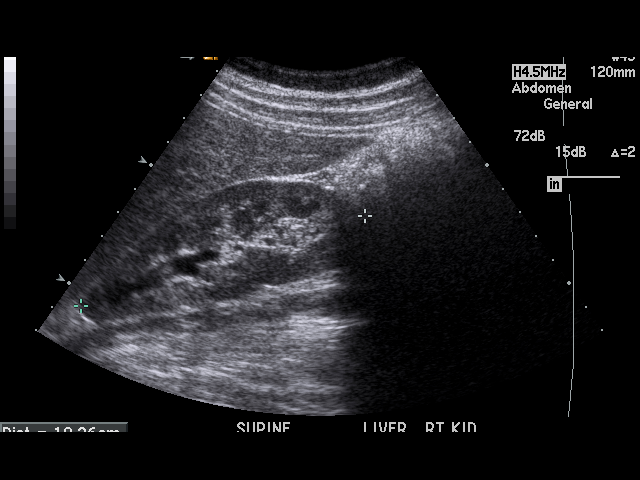
[im 20/79]
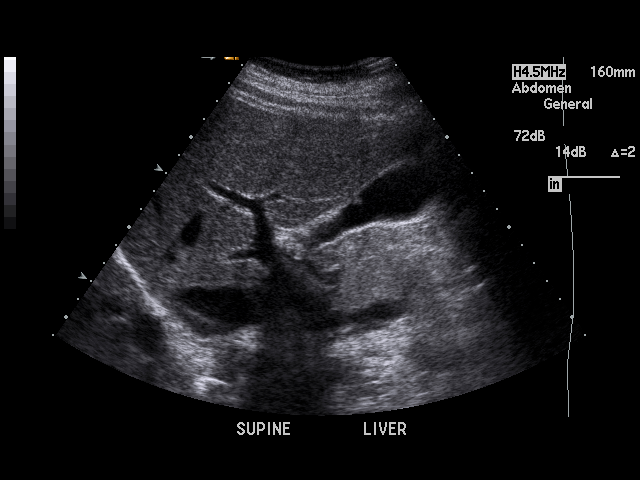
[im 27/79]
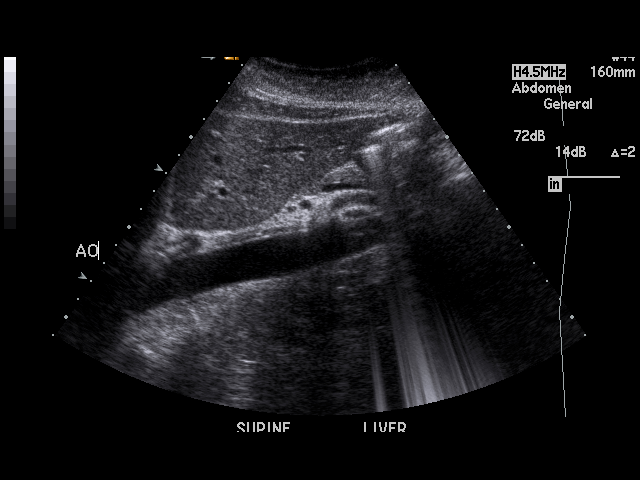
[im 30/79]
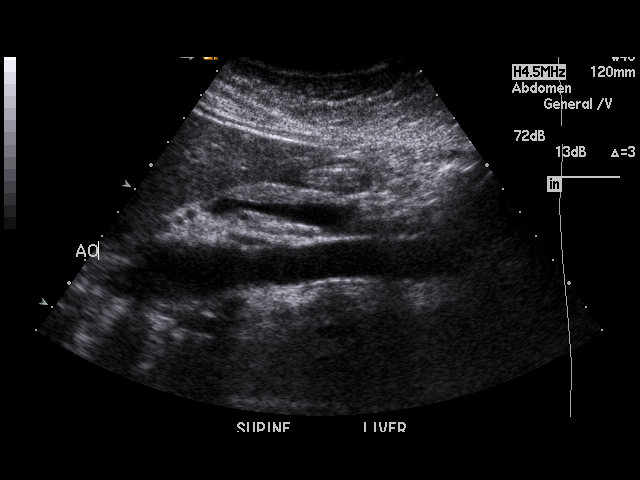
[im 36/79]
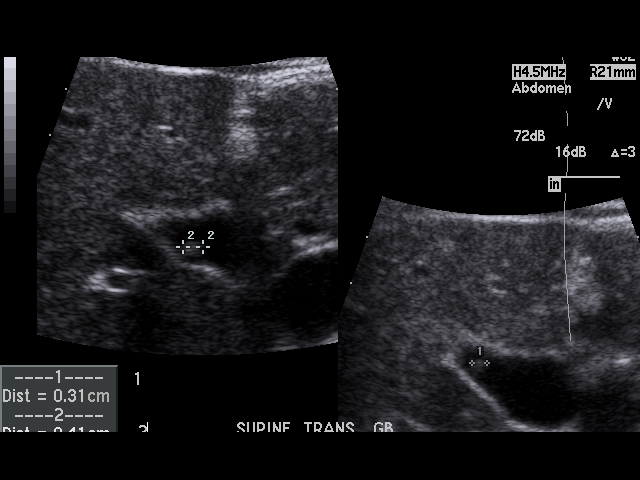
[im 40/79]
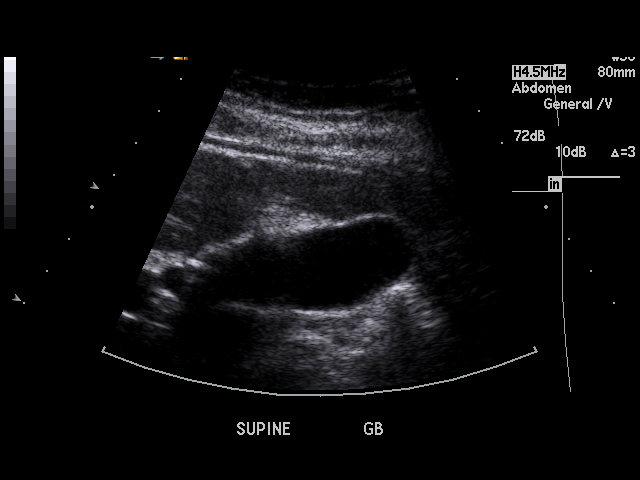
[im 43/79]
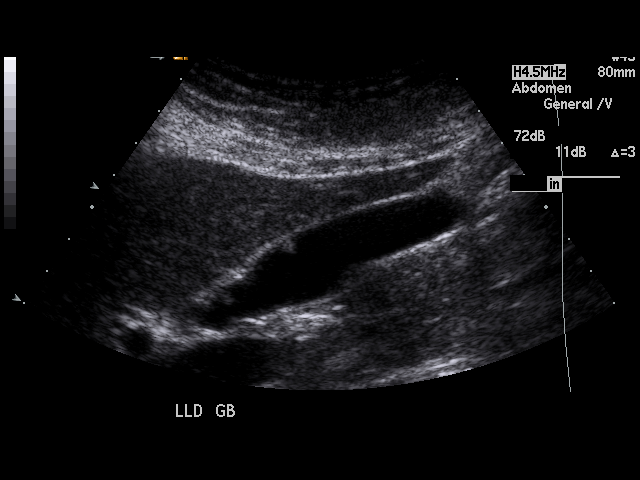
[im 49/79]
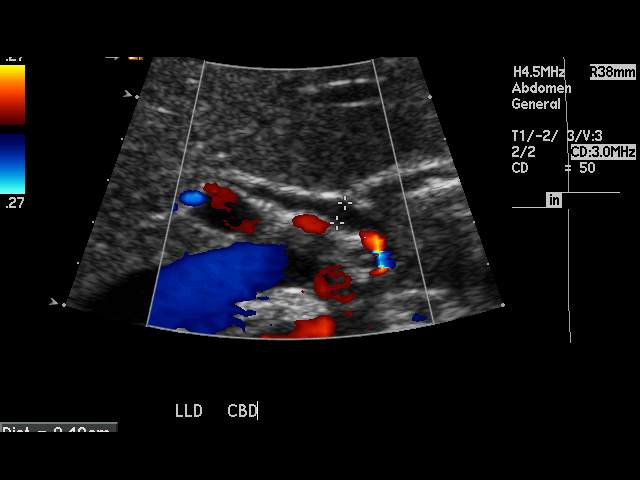
[im 53/79]
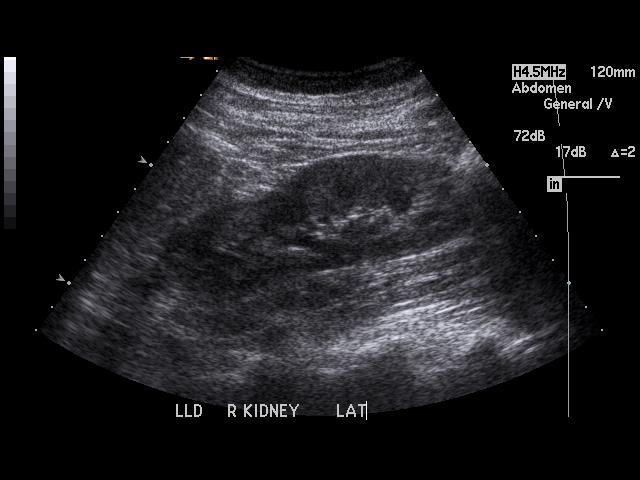
[im 59/79]
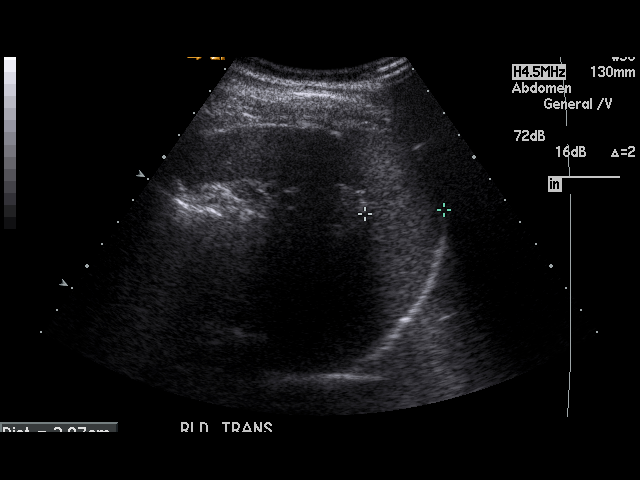
[im 62/79]
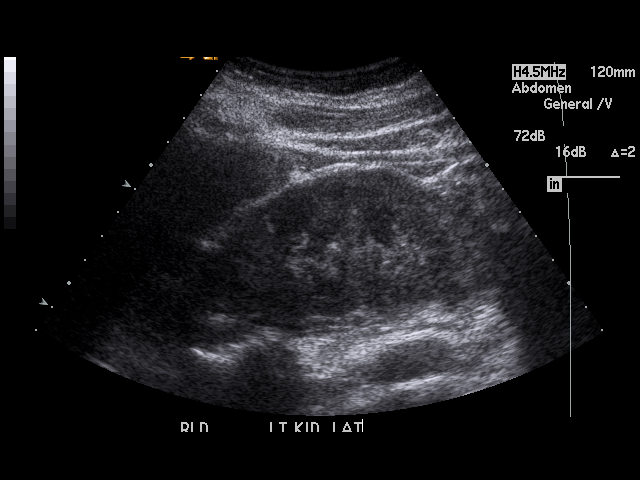
[im 69/79]
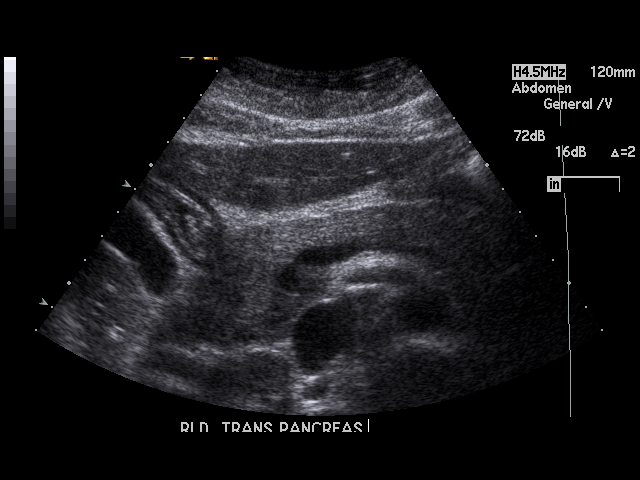
[im 72/79]
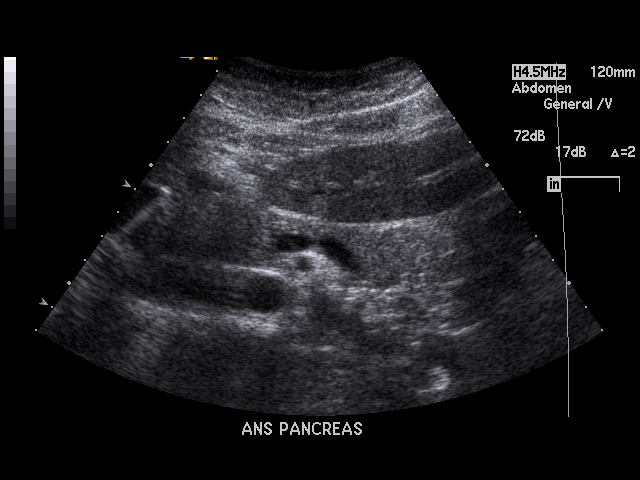
[im 79/79]
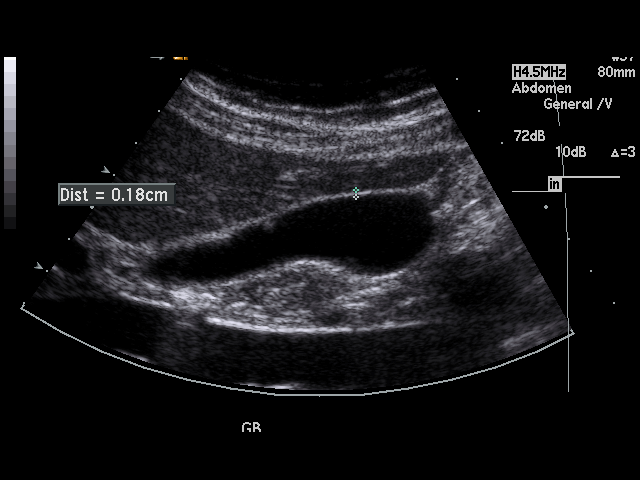

[17 of 25 positions shown; findings below may reference images not displayed]

PROCEDURE:     KLPIGBB - KLPIGBB ABDOMEN UPPER GENERAL  - [DATE]  [DATE]

RESULT:     The liver, spleen, pancreas, abdominal aorta and inferior vena
cava show no significant abnormalities. No gallstones are seen. There are
two, nonmobile echodensities in the gallbladder compatible with gallbladder
polyps. The larger measures 4.9 mm in diameter. There is no thickening of
the gallbladder wall. The common bile duct measures 4.9 mm in diameter which
is within normal limits. The kidneys show no hydronephrosis. There is no
ascites.
IMPRESSION: 1.  No gallstones are seen.
2.  There are two, nonshadowing, nonmobile echodensities in the gallbladder
consistent with gallbladder polyps. The larger measures 4.9 mm in diameter.

## 2010-04-12 ENCOUNTER — Telehealth: Payer: Self-pay | Admitting: Internal Medicine

## 2010-04-18 ENCOUNTER — Telehealth: Payer: Self-pay | Admitting: Internal Medicine

## 2010-04-25 ENCOUNTER — Telehealth: Payer: Self-pay | Admitting: Internal Medicine

## 2010-07-03 NOTE — Letter (Signed)
Summary: Device-Delinquent Phone Journalist, newspaper, Main Office  [address]   [place], [place] [address]   Phone: [phone]  Fax: [phone]     March 08, 2010 MRN: [id number]   [facility] [address] [place], [place]  [address]   Dear Ms. [facility],  According to our records, you were scheduled for a device phone transmission on 03-01-10.     We did not receive any results from this check.  If you transmitted on your scheduled day, please call us to help troubleshoot your system.  If you forgot to send your transmission, please send one upon receipt of this letter.  Thank you,   Architectural technologist [facility]

## 2010-07-03 NOTE — Letter (Signed)
Summary: Remote Device Check  [employer], Main Office  [address]   [place], [place] [address]   Phone: [phone]  Fax: [phone]     June 22, 2009 MRN: [id number]   [facility] [address] [place], [place]  [address]   Dear Ms. [place],   Your remote transmission was recieved and reviewed by your physician.  All diagnostics were within normal limits for you.  __X___Your next transmission is scheduled for:    August 30, 2009.  Please transmit at any time this day.  If you have a wireless device your transmission will be sent automatically.     Sincerely,  Proofreader

## 2010-07-03 NOTE — Progress Notes (Signed)
Summary: EXERCISING   Phone Note Call from Patient Call back at Work Phone [phone]   Caller: SELF Call For: Angela [name] Summary of Call: PT HAS STARTED EXERCISING-HAS BEEN EXERCISING FOR 2 WEEKS-FEELING LIGHTHEAED AND SOB WHEN EXERCISIING-TAKING A LIGHT KICKBOXING CLASS Initial call taken by: Harlon Flor,  Oct 11, 2009 12:01 PM  Follow-up for Phone Call        Called spoke with pt.  Pt states BP 107/60 running low, feeling lightheaded and dizzy.  Pt states she has lost some weight recently.  Pt states she does well in the class until she gets to the aerobic part of the class and her HR gets up and she can feel the pacer kick in and try to slow the HR.  Pt wants to know if Dr Angela [name] would like pt to do a download just to make sure when she is exercising what her heart is doing, and to make sure when she is feeling symptomatic nothing serious is going on.  Please advise.  Thanks Follow-up by: Cloyde Reams RN,  Oct 12, 2009 9:05 AM  Additional Follow-up for Phone Call Additional follow up Details #1::        Spoke with patient.  She will send a Carelink transmission tonight.  If we are unable to tell anything from that, then we will place 24 hour holter monitor to be put on the day before she plans on exercising.  Pt aware and agrees with plan. Gypsy Balsam RN BSN  Oct 12, 2009 10:08 AM   pt calling request to speak to [name], unable to transmit, [name]  Oct 12, 2009 4:46 PM  Carelink transmission reviewed, normal for patient.  Will place 24 hour Holter to be put on day before pt is planning on exercising. Gypsy Balsam RN BSN  Oct 17, 2009 7:48 AM  patient aware of above. Gypsy Balsam RN BSN  Oct 17, 2009 11:04 AM

## 2010-07-03 NOTE — Cardiovascular Report (Signed)
Summary: Office Visit Remote   Office Visit Remote   Imported By: Roderic Ovens 09/08/2009 15:07:00  _____________________________________________________________________  External Attachment:    Type:   Image     Comment:   External Document

## 2010-07-03 NOTE — Progress Notes (Signed)
Summary: returned call   b Phone Note Call from Patient   Caller: Patient Reason for Call: Talk to Nurse Summary of Call: pt returning rhonda's call-pls call [phone] Initial call taken by: Glynda Jaeger,  April 25, 2010 11:41 AM  Follow-up for Phone Call        spoke w/pt and she has pursued FMLA with PCP. She will send medical release info and we will forward med records to assist in process any way we can.  Pt did state that she has been offered 2 different jobs when she goes back to work with less responsiblity. she is also considering going to [place] to a POTS clinic that also has a psychologist for emotional support.  Follow-up by: Claris Gladden RN,  April 25, 2010 1:11 PM

## 2010-07-03 NOTE — Letter (Signed)
Summary: Remote Device Check  [employer], Main Office  [address]   [place], [place] [address]   Phone: [phone]  Fax: [phone]     September 04, 2009 MRN: [id number]   [facility] [address] [place], [place]  [address]   Dear Ms. [facility],   Your remote transmission was recieved and reviewed by your physician.  All diagnostics were within normal limits for you.  __X___Your next transmission is scheduled for:   November 29, 2009.  Please transmit at any time this day.  If you have a wireless device your transmission will be sent automatically.     Sincerely,  Proofreader

## 2010-07-03 NOTE — Progress Notes (Signed)
Summary: need note for work to take a leave from work   Phone Note Call from Patient Call back at Work Phone [phone]   Caller: Patient Summary of Call: pt needs a note stating her health  so that she can get leave from work Initial call taken by: Judie Grieve,  April 12, 2010 4:34 PM  Follow-up for Phone Call        pt needs a note for medical leave explaining situation and why it is recommended that she take time off. Will discuss w/MD. Follow-up by: Claris Gladden RN,  April 12, 2010 5:17 PM  Additional Follow-up for Phone Call Additional follow up Details #1::        Dr. Graciela Husbands to call pt.  Additional Follow-up by: Claris Gladden RN,  April 12, 2010 6:41 PM

## 2010-07-03 NOTE — Assessment & Plan Note (Signed)
Summary: 11:00 appt. pacer check      Allergies Added:   Visit Type:  Follow-up Primary Provider:  Dr. Dossie Arbour  CC:  c/o feeling tired all the time with occas. 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 When we saw her last time she had symptoms related to 2:1 behavior at her upper rate limit which we reprogrammed around and also had evidence of POTS   Life has been incredibly stressful with the resumption of school and lack of sleep and chronic constipation  Current Medications (verified): 1)  Wellbutrin Xl 300 Mg Xr24h-Tab (Bupropion Hcl) .... Take 1 Tablet By Mouth Once A Day 2)  Klonopin 0.5 Mg Tabs (Clonazepam) .... Take One Tablet in The Morning and One Half in The Evening 3)  Ibuprofen 600 Mg Tabs (Ibuprofen) .... Take 1 Tablet By Mouth Once A Day 4)  Metoprolol Succinate 25 Mg Xr24h-Tab (Metoprolol Succinate) .... Take One Tablet Once Daily  Allergies (verified): 1)  ! * Toprol  Past History:  Past Medical History: Last updated: 08/10/2008  1. SVT s/p ablation     a. c/b AV Node ablation requiring pacemaker  2. Chest pain     a. cath 2/10. EF 50-55%. normal coronaries  3. Depression  4. Dyspnea    a. CT negative for PE, 2007  5. Obsessive-compulsive disorder  Vital Signs:  Patient profile:   40 year old female Height:      64 inches Weight:      146 pounds BMI:     25.15 Pulse rate:   84 / minute BP sitting:   102 / 80  (left arm) Cuff size:   regular  Vitals Entered By: Bishop Dublin, CMA (April 09, 2010 11:07 AM)  Physical Exam  General:  The patient was alert and oriented in no acute distress. HEENT Normal.  Neck veins were flat, carotids were brisk.  Lungs were clear.  Heart sounds were regular without murmurs or gallops.  Abdomen was soft with active bowel sounds. There is no clubbing cyanosis or edema. Skin Warm and  dry    PPM Specifications Following MD:  Sherryl Manges, MD     PPM Vendor:  Medtronic     PPM Model Number:  [id number]     PPM Serial Number:  [id number] PPM DOI:  08/19/2005     PPM Implanting MD:  Sherryl Manges, MD  Lead 1    Location: RA     DOI: 08/19/2005     Model #: [id number]     Serial #: [id number]     Status: active Lead 2    Location: RV     DOI: 08/19/2005     Model #: [id number]     Serial #: [id number]     Status: active  Magnet Response Rate:  BOL 85 ERI  65  Indications:  CHB  Explantation Comments:  Carelink  PPM Follow Up Remote Check?  No Battery Voltage:  2.99 V     Pacer Dependent:  No       PPM Device Measurements Atrium  Amplitude: 3.4 mV, Impedance: 384 ohms, Threshold: 1.0 V at 0.4 msec  Episodes MS Episodes:  0     Percent Mode Switch:  0     Coumadin:  No Atrial Pacing:  13.2%     Ventricular Pacing:  99.9%  Parameters Mode:  DDD  Lower Rate Limit:  60     Upper Rate Limit:  150 Paced AV Delay:  180     Sensed AV Delay:  150 Next Remote Date:  07/12/2010     Next Cardiology Appt Due:  04/04/2011 Tech Comments:  No parameter changes.  Device function normal.  Carelink transmissions every 3months.  ROV 1 year with Dr. Graciela Husbands in [place]. Altha Harm, LPN  April 09, 2010 11:22 AM   Impression & Recommendations:  Problem # 1:  AV BLOCK, COMPLETE (ICD-426.0) stabl;e The following medications were removed from the medication list:    Metoprolol Tartrate 25 Mg Tabs (Metoprolol tartrate) ..... Once a week    Bisoprolol Fumarate 5 Mg Tabs (Bisoprolol fumarate) .Marland Kitchen... Take one half tablet once daily    Inderal La 80 Mg Xr24h-cap (Propranolol hcl) .Marland Kitchen... Take one tablet once daily Her updated medication list for this problem includes:    Metoprolol Succinate 25 Mg Xr24h-tab (Metoprolol succinate) .Marland Kitchen... Take one tablet once daily  Problem # 2:  CARDIAC PACEMAKER IN SITU (ICD-V45.01) Device parameters and data were reviewed and no changes were made  Problem # 3:   DYSAUTONOMIA (ICD-742.8) We spenIt about 45 min discussing strategies of dealing with the POTS stress and depression  I will contact her once I hear back aobut the as [place]

## 2010-07-03 NOTE — Procedures (Signed)
Summary: Summary Report  Summary Report   Imported By: Erle Crocker 12/08/2009 12:40:08  _____________________________________________________________________  External Attachment:    Type:   Image     Comment:   External Document

## 2010-07-03 NOTE — Cardiovascular Report (Signed)
Summary: Office Visit Remote   Office Visit Remote   Imported By: Roderic Ovens 04/09/2010 16:17:18  _____________________________________________________________________  External Attachment:    Type:   Image     Comment:   External Document

## 2010-07-03 NOTE — Progress Notes (Signed)
Summary: RESULTS   Phone Note Call from Patient Call back at Work Phone [phone]   Caller: SELF Call For: Angela [name] Summary of Call: PT IS CALLING TO GET HOLTER MONITOR RESULTS Initial call taken by: Harlon Flor,  November 14, 2009 8:09 AM  Follow-up for Phone Call        Per Gypsy Balsam pt is scheduled for excersie stress test. pt called unaware of that test being scheduled. Pt had not received results from Holter monitor, but is reassured by holter results and does not wish to proceed with stress test. She states she wore the holter, did an ultra hard workout experienced same symptoms and is reassured monitor showed nothing.  Pt does not think a stress test is necessary unless Dr Angela [name] feels strongly about.  Pt was just unsure after pacer and resuming exercise what to expect. Follow-up by: Cloyde Reams RN,  November 15, 2009 3:51 PM  Additional Follow-up for Phone Call Additional follow up Details #1::        Called patient and left message on machine [employer] RN BSN  November 15, 2009 5:54 PM  Spoke with patient.  Started new rigourous excersie program, still will do GXT to evaluate to see if we can make programming changes to help with upper rate behavior during exercise. Gypsy Balsam RN BSN  November 16, 2009 3:08 PM

## 2010-07-03 NOTE — Cardiovascular Report (Signed)
Summary: Office Visit Remote   Office Visit Remote   Imported By: Roderic Ovens 06/28/2009 12:31:46  _____________________________________________________________________  External Attachment:    Type:   Image     Comment:   External Document

## 2010-07-03 NOTE — Progress Notes (Signed)
Summary: status of letter   Phone Note Call from Patient   Caller: Patient Reason for Call: Talk to Nurse Summary of Call: pt following up on a letter dr Graciela Husbands was to write for her, she knows his out this week, just wanted to know the status [phone] Initial call taken by: Glynda Jaeger,  April 18, 2010 4:25 PM  Follow-up for Phone Call        spoke w/pt and she adv that Dr. Graciela Husbands did not call her last week. She stated that from last appointment with Dr. Graciela Husbands he felt that she needed to make some changes or she wouldn't be around to raise her children.  This is why she was looking for the letter for her work to take off till next year. will discuss again w/Dr. Graciela Husbands on Monday. Follow-up by: Claris Gladden RN,  April 18, 2010 5:08 PM     Appended Document: status of letter [email or web address] Claris Gladden, RN, BSN

## 2010-07-10 ENCOUNTER — Encounter: Payer: Self-pay | Admitting: Internal Medicine

## 2010-07-12 ENCOUNTER — Encounter (INDEPENDENT_AMBULATORY_CARE_PROVIDER_SITE_OTHER): Payer: BC Managed Care – PPO

## 2010-07-12 DIAGNOSIS — I495 Sick sinus syndrome: Secondary | ICD-10-CM

## 2010-07-23 ENCOUNTER — Encounter (INDEPENDENT_AMBULATORY_CARE_PROVIDER_SITE_OTHER): Payer: Self-pay | Admitting: *Deleted

## 2010-07-25 ENCOUNTER — Ambulatory Visit (INDEPENDENT_AMBULATORY_CARE_PROVIDER_SITE_OTHER): Payer: BC Managed Care – PPO | Admitting: Gynecology

## 2010-07-25 DIAGNOSIS — N926 Irregular menstruation, unspecified: Secondary | ICD-10-CM

## 2010-07-25 DIAGNOSIS — R82998 Other abnormal findings in urine: Secondary | ICD-10-CM

## 2010-07-25 DIAGNOSIS — R1031 Right lower quadrant pain: Secondary | ICD-10-CM

## 2010-07-27 ENCOUNTER — Other Ambulatory Visit: Payer: BC Managed Care – PPO

## 2010-07-27 ENCOUNTER — Ambulatory Visit (INDEPENDENT_AMBULATORY_CARE_PROVIDER_SITE_OTHER): Payer: BC Managed Care – PPO | Admitting: Gynecology

## 2010-07-27 DIAGNOSIS — N949 Unspecified condition associated with female genital organs and menstrual cycle: Secondary | ICD-10-CM

## 2010-07-27 DIAGNOSIS — R1031 Right lower quadrant pain: Secondary | ICD-10-CM

## 2010-07-31 NOTE — Cardiovascular Report (Signed)
Summary: Office Visit   Office Visit   Imported By: Roderic Ovens 07/27/2010 09:08:37  _____________________________________________________________________  External Attachment:    Type:   Image     Comment:   External Document

## 2010-07-31 NOTE — Letter (Signed)
Summary: Remote Device Check  [employer], Main Office  [address]   [place], [place] [address]   Phone: [phone]  Fax: [phone]     July 23, 2010 MRN: [id number]   [facility] [address] [place], [place]  [address]   Dear Ms. [facility],   Your remote transmission was recieved and reviewed by your physician.  All diagnostics were within normal limits for you.  __X___Your next transmission is scheduled for:   10-11-2010.  Please transmit at any time this day.  If you have a wireless device your transmission will be sent automatically.   Sincerely,  Vella Kohler

## 2010-08-13 ENCOUNTER — Encounter (INDEPENDENT_AMBULATORY_CARE_PROVIDER_SITE_OTHER): Payer: BC Managed Care – PPO | Admitting: Gynecology

## 2010-08-13 ENCOUNTER — Other Ambulatory Visit (HOSPITAL_COMMUNITY)
Admission: RE | Admit: 2010-08-13 | Discharge: 2010-08-13 | Disposition: A | Discharge status: Home / Self Care | Payer: BC Managed Care – PPO | Source: Ambulatory Visit | Attending: Gynecology | Admitting: Gynecology

## 2010-08-13 ENCOUNTER — Other Ambulatory Visit: Payer: Self-pay | Admitting: Gynecology

## 2010-08-13 DIAGNOSIS — R82998 Other abnormal findings in urine: Secondary | ICD-10-CM

## 2010-08-13 DIAGNOSIS — Z124 Encounter for screening for malignant neoplasm of cervix: Secondary | ICD-10-CM | POA: Insufficient documentation

## 2010-08-13 DIAGNOSIS — Z01419 Encounter for gynecological examination (general) (routine) without abnormal findings: Secondary | ICD-10-CM

## 2010-08-13 DIAGNOSIS — Z13220 Encounter for screening for lipoid disorders: Secondary | ICD-10-CM

## 2010-08-13 DIAGNOSIS — Z833 Family history of diabetes mellitus: Secondary | ICD-10-CM

## 2010-09-24 ENCOUNTER — Telehealth: Payer: Self-pay | Admitting: Internal Medicine

## 2010-09-24 NOTE — Telephone Encounter (Signed)
Spoke to pt, she stated that she was at the gym this AM and noticed she became more winded than normal. And after workout she felt tightness in chest. Pt states this is the first occurrence of these symptoms, she has been doing well for so long, working out with no problems. Pt denies over-exerting herself at gym. When spoke to pt she states she is asymptomatic at this time. Pt did not check BP/HR at time of symptoms, however, recently has been averaging 105-110/60-65 and HR 70s-75. Pt had cath >2 years prior that was normal. Pt does have h/o POTS. Pt is scheduled for telephone Pacemaker interrogation 10/11/10. Will notify Gunnar Fusi, LPN to see if pt can do sooner to see if this shows any reason for pt's episode. Otherwise, pt will monitor symptoms and call with any changes.

## 2010-09-24 NOTE — Telephone Encounter (Signed)
Pt c/o tightness in chest over the past few days and not feeling normal.

## 2010-09-24 NOTE — Telephone Encounter (Signed)
Spoke with patient. Episode today of indigestion type chest pain and fatigue relieved with rest.  Patient was instructed to go to ER if pain returns.  I will discuss this with Dr. Graciela Husbands and call her back with update tomorrow.

## 2010-09-25 NOTE — Telephone Encounter (Signed)
Left message for patient after discussion with Dr. Graciela Husbands.  She is to monitor any further indigestion type pain and call us if there are more episodes.

## 2010-10-08 ENCOUNTER — Other Ambulatory Visit: Payer: Self-pay | Admitting: *Deleted

## 2010-10-08 MED ORDER — METOPROLOL SUCCINATE ER 25 MG PO TB24: 25.0000 mg | ORAL_TABLET | Freq: Every day | ORAL | Status: DC

## 2010-10-11 ENCOUNTER — Ambulatory Visit (INDEPENDENT_AMBULATORY_CARE_PROVIDER_SITE_OTHER): Payer: BC Managed Care – PPO | Admitting: *Deleted

## 2010-10-11 DIAGNOSIS — Z950 Presence of cardiac pacemaker: Secondary | ICD-10-CM

## 2010-10-11 DIAGNOSIS — I442 Atrioventricular block, complete: Secondary | ICD-10-CM

## 2010-10-15 ENCOUNTER — Other Ambulatory Visit: Payer: Self-pay

## 2010-10-15 NOTE — Progress Notes (Signed)
Pacer remote check  

## 2010-10-16 NOTE — Assessment & Plan Note (Signed)
[facility]                         ELECTROPHYSIOLOGY OFFICE NOTE   [name], [name]                    MRN:          [id number]  DATE:02/13/2007                            DOB:          [birth year]    [name] comes in following AV nodal modification, with resultant  complete heart and status post pacemaker implantation.   She continues to struggle.  She now describes a sleep disturbance that  dates back to the night of her procedure.  She continues to be under  significant stress related to financial matters.  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   MEDICATIONS:  Currently include only the Wellbutrin.   EXAMINATION:  VITAL SIGNS:  On examination, her blood pressure today was  some better at 126/83.  There is mild orthostatic change with a pulse  going from 80 to 108 and then returning back towards normal.  LUNGS:  Clear.  HEART:  Sounds were regular.  EXTREMITIES:  Without edema.   Interrogation of her Medtronic and rhythm pulse generator demonstrates a  P-wave of 3.8 with impedance of 355, a threshold of 0.5 and 0.4.  The R-  wave is 9.2 with impedance of 456, a threshold of 1 volt at 0.4, battery  voltage 3.00.  Heart rate excursion was adequate.   IMPRESSION:  1. 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  2. Complete heart block secondary to #1.  3. Status post Medtronic pacemaker for #2.  4. Anxiety and sleep disturbance temporarily related to #3.   I suggest that she consider taking some Benadryl at night.  She is  taking Ambien now, and I suggest that she could talk to her physician,  in the event that the Benadryl does not work, that maybe Xanax which is  available generically may be sufficient for her needs, as opposed to the  proprietary Ambien, which has a huge cost consideration for her.   We will see her again in one year's time.     Duke Salvia, MD,  [facility]  Electronically Signed    SCK/MedQ  DD: 02/13/2007  DT: 02/14/2007  Job #: [id number]

## 2010-10-16 NOTE — Letter (Signed)
November 04, 2006    Angelia Mould. Derrell Lolling, M.D.  [address]  [place], [place] [address]   RE:  [name], [name]  MRN:  [id number]  /  DOB:  21-May-1971   Dear Mikey Bussing:   I hope this letter finds you and your family well.  Angela [name] is a  delightful young lady who has complete heart block secondary to an AV  nodal modification procedure for which she has a pacemaker.  The  pacemaker is susceptible to inhibition from electrocautery.  Apart from  that issue, she should be acceptable risk for surgery.   Please let me know if there is any further way I could be of assistance,  and if she can be done as an inpatient, please let me know.  I would be  glad to come by and follow along with you.    Sincerely,      Duke Salvia, MD, [facility]  Electronically Signed    SCK/MedQ  DD: 11/04/2006  DT: 11/04/2006  Job #: [id number]

## 2010-10-16 NOTE — Assessment & Plan Note (Signed)
[facility] OFFICE NOTE   Angela [name], Angela [name]                    MRN:          [id number]  DATE:07/15/2008                            DOB:          December 16, 1970    INTERVAL HISTORY:  Angela [name] is a delightful 40 year old woman with a  history of SVT status post ablation.  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   She presents today for an unscheduled visit due to chest pain and  fatigue.  She says over the past few days, she feels like she is being  getting a cold.  She has had some sinus congestion and she has just been  extremely fatigued.  She says she usually just sleeps about 6 hours at  night, but now has been going to bed early and having a hard time  getting up.  Over the past couple of days, she has had often on pain in  the left side of her chest like someone is pushing on a very small spot  with a pin.  This comes and goes.  It is not associated with  diaphoresis, dyspnea, or any nausea or vomiting.  She says occasionally  she does feel lightheaded at times.  She has not noticed palpitations at  these times.  It is not worse with exertion.  She says she does feel  better when she lays down.  She has been coughing quite a bit, but no  fevers and chills.   About 4 or 5 years ago, she had a nuclear study which was negative.   CURRENT MEDICATIONS:  1. Wellbutrin XR 300 a day.  2. Amitiza 8 mcg b.i.d.  3. She is not taking her metoprolol currently.   PHYSICAL EXAMINATION:  GENERAL:  She is fatigued appearing in no acute  distress.  Ambulates around the clinic without respiratory difficulty.  She has sinus congestion.  VITAL SIGNS:  Blood pressure is 114/76, heart rate is 90, weight is 150.  HEENT:  Normal.  NECK:  Supple.  There is no JVD.  Carotids are 2+ bilaterally without  any bruits.  There is no lymphadenopathy or thyromegaly.  CARDIAC:  PMI  is  nondisplaced.  She has a regular rate and rhythm.  No murmurs, rubs,  or gallops.  There is no diaphragmatic pacing appreciated.  LUNGS:  Clear.  There is no pain on palpation of her chest wall.  ABDOMEN:  Soft, nontender, nondistended.  No hepatosplenomegaly.  No  bruits.  No masses.  Good bowel sounds.  EXTREMITIES:  Warm with no  cyanosis, clubbing, or edema.  No rash.  NEURO:  Alert and oriented x3.  Cranial nerves II through XII are  intact.  Moves all 4 extremities without difficulty.  Affect is  pleasant.   EKG shows sinus rhythm with V-pacing at a rate of 90.   ASSESSMENT AND PLAN:  1. Chest pain.  This is fairly atypical.  I suspect it may be related      to her cough and her underlying upper respiratory tract infection.  However, given her family history of premature coronary artery      disease, I do think it is reasonable to repeat a treadmill Myoview.  2. Fatigue.  Once again I suspect this is related to probable viral      illness, but we will go ahead and get some blood work including a      CBC, BMET, and a thyroid panel to further evaluate.  If this      persists, she will need further followup with her primary care      physician.   Of note, I did remind her that if chest pain gets very severe that she  should call 911 and go to the nearest emergency room.     Bevelyn Buckles. Bensimhon, MD  Electronically Signed    DRB/MedQ  DD: 07/15/2008  DT: 07/16/2008  Job #: [id number]

## 2010-10-16 NOTE — Op Note (Signed)
Angela [name], Angela [name]             ACCOUNT NO.:  [id number]   MEDICAL RECORD NO.:  [id number]          PATIENT TYPE:  AMB   LOCATION:  SDS                          FACILITY:  [facility]   PHYSICIAN:  Angelia Mould. Derrell Lolling, M.D.DATE OF BIRTH:  1970-12-11   DATE OF PROCEDURE:  11/24/2006  DATE OF DISCHARGE:                               OPERATIVE REPORT   PREOPERATIVE DIAGNOSIS:  Right breast mass.   POSTOPERATIVE DIAGNOSIS:  Right breast mass.   OPERATION PERFORMED:  Excisional biopsy right breast mass.   SURGEON:  Dr. Claud Kelp   OPERATIVE INDICATIONS:  This is a 40 year old white female who has a 3-  month history of a lump in her right breast at the 6 o'clock position.  She had mammograms and a right breast ultrasound [facility] and  no focal abnormalities were noted.  She was examined by me in April of  this year at which time I felt area of rubbery, thickening at the 6  o'clock position.  We thought that this was more than likely a low risk  finding representing fibrocystic disease.  She was comfortable with that  and then returned to see me about 6 weeks later stating she felt the  mass was becoming a little more painful, was a little bit larger.  She  had been thinking about her paternal aunts, two of whom have been  diagnosed with breast cancer and a maternal grandmother who died of  breast cancer and regardless of the negative radiographic findings  wanted to have this area biopsied.  I felt that was reasonable.  She is  brought to operating room electively.   OPERATIVE TECHNIQUE:  The patient was brought to operating room, placed  supine on the operating table.  General LMA anesthetic was induced by  Burna Forts, M.D.  I felt that because of her AV sequential  pacemaker he would place a magnet over the pacemaker and he did so.  The  right breast was then prepped and draped in sterile fashion.  1%  Xylocaine with epinephrine was used as local infiltration  anesthetic.   I had previously used a marking pen to map out the inframammary crease  and the incision.  I made a vertically oriented, radially oriented  elliptical incision and dissected down deep into the breast tissue.  This simply felt like breast glandular tissue and fibrocystic change.  The ellipse of skin and underlying breast tissue was sent for routine  histology.  Hemostasis was excellent and achieved with electrocautery.  It was irrigated with saline.  The deeper breast tissues were closed  with interrupted sutures of 3-0 Vicryl.  Skin was closed with running  subcuticular suture  of 4-0 Monocryl and Steri-Strips.  Clean bandages were placed and the  patient taken recovery room in stable condition.  Estimated blood loss  was about 500 mL.  Complications none, sponge, needle, and instrument  counts were correct.      Angelia Mould. Derrell Lolling, M.D.  Electronically Signed     HMI/MEDQ  D:  11/24/2006  T:  11/24/2006  Job:  [id number]   cc:  Timothy P. Fontaine, M.D.

## 2010-10-16 NOTE — Progress Notes (Signed)
[facility] OFFICE [facility]   Angela [name], Angela [name]                    MRN:          [id number]  DATE:05/14/2007                            DOB:          06-May-1971    Angela [name] returns today for followup.  She is a very pleasant young  woman with a history of SVT at rates of over 200 beats per minute, who  underwent slow pathway modification complicated by the development of  complete heart block.  She is status post pacemaker insertion.  She  returns today for followup.  She had multiple complaints today.  She  continued to have palpitations, and she has not taken her propranolol.  She has begun working as a Financial controller, and she enjoys this  quite a bit, but does note that when she stands up really quickly she  will get a little light-headed and dizzy, though she has never had frank  syncope or other problems with this.  She also notes a problem with  anxiety and difficulty sleeping at nighttime.  The patient otherwise had  no specific complaint today.   MEDICATIONS:  1. Wellbutrin 300 a day.  2. Propranolol LA 40 mg a day p.r.n., which she is not taking.   PHYSICAL EXAMINATION:  GENERAL:  She is a pleasant well-appearing young  woman in no distress.  VITAL SIGNS:  The blood pressure was 140/90, the pulse 100 and regular,  respirations were 18, and the weight was 144 pounds.  NECK:  Revealed no jugular venous distention.  LUNGS:  Clear bilaterally to auscultation.  No wheezes, rales, or  rhonchi were present.  CARDIOVASCULAR:  Revealed a regular rate and rhythm, with normal S1 and  S2.  EXTREMITIES:  Demonstrated no edema.   Interrogation of her pacemaker demonstrates a Medtronic EnRhythm with P  and R-waves of 2 and 8, respectively, the impedance 344 in the atrium  and 456 in the ventricle.  Battery voltage was 3 volts.  She had no mode-  switching episodes noted.  She was underlying  A-paced 1% of the time,  and V-paced 100% of the time.  Her underlying rhythm was sinus rhythm  and sinus tachycardia, with complete heart block and a ventricular  escape rate of 60 beats per minute.   IMPRESSION:  1. Complete heart block following supraventricular tachycardia      ablation.  2. Palpitations.  3. Intermittent hypertension.  4. Episodes of dizziness and lightheadedness, consistent with      orthostasis.  5. History of anxiousness and palpitations.   DISCUSSION:  Today, I have asked Angela [name] to try a very low dose of  atenolol 12.5 mg tried at nighttime.  If she is able to tolerate this,  then we will gradually over the next several weeks  uptitrate her medications.  She has not been tolerant of Inderal.  I  have asked that she start an exercise program and walking regularly.  We  will see her back in the office in several months.     Doylene Canning. Ladona Ridgel, MD  Electronically Signed    GWT/MedQ  DD: 05/14/2007  DT: 05/15/2007  Job #: [id number]

## 2010-10-16 NOTE — Letter (Signed)
March 23, 2009     RE:  SHELDA, TRUBY  MRN:  [id number]  /  DOB:  08-29-1970   To Whom It May Concern:   Ms. Ord has been under my care since March 2007.  She had a prior  history of supraventricular tachycardia for which she underwent slow  pathway modification for treatment of recurrent tachycardia with  documented rates of over 200 beats per minute.   Unfortunately that procedure was complicated by a complete heart block  resulting in the need of permanent pacing.  This was a life altering  complication in this woman who was then 35.  In addition, there appeared  to be autonomic disruption resulting in some degree of sinus  tachycardia.  Multiple reprogrammings of her pacemaker were required to  optimize her functional status.   In addition, becoming device dependent at such a young age as a  complication of procedure resulted in associated stress translated into  sleep disturbances which have again taken some time to find solutions  for.   In summary, Ms. Breaker suffered at the age of 7 a complication resulting  in lifelong like altering consequences requiring permanent pacing, as  well as further complications related to likely autonomic dysfunction  potentially related to the ablation procedure.   If any further information is of value, please do not hesitate to  contact me.    Sincerely,     Duke Salvia, MD, [facility]   SCK/MedQ  DD: 03/23/2009  DT: 03/23/2009  Job #: [id number]

## 2010-10-16 NOTE — Cardiovascular Report (Signed)
NAMELUNDYNN, COHOON             ACCOUNT NO.:  [id number]   MEDICAL RECORD NO.:  [id number]          PATIENT TYPE:  OIB   LOCATION:  1963                         FACILITY:  [facility]   PHYSICIAN:  Bevelyn Buckles. Bensimhon, MDDATE OF BIRTH:  08/13/1970   DATE OF PROCEDURE:  07/29/2008  DATE OF DISCHARGE:                            CARDIAC CATHETERIZATION   INDICATIONS:  Angela [name] is a 40 year old woman with a history of SVT  status post ablation.  This was complicated by AV node dysfunction and  she underwent pacemaker placement.  She presented to the office for  scheduled visit for her chest pain, has had typical and atypical  features.  We did a Myoview to further evaluate and both Dr. Shirlee Latch and  I looked at it and we were concerned for anterior ischemia.  She is now  referred for diagnostic catheterization in the outpatient lab.   PROCEDURES PERFORMED:  1. Selective coronary angiography.  2. Left heart cath.  3. Left ventriculogram.   DESCRIPTION OF PROCEDURE:  The risks and indication were explained.  Consent was signed and placed on chart.  A 4-French arterial sheath was  placed in the right femoral artery using a modified Seldinger technique.  Standard catheters including a JL-4, 3DRC, and angled pigtail were used  for the procedure.  All catheter exchanges were made over wire.  There  were no apparent complications.   Central aortic pressure of 114/76 with a mean of 92.  LV pressure was  118/9 with an EDP of 12.  There was no aortic stenosis.   Left main was normal.   LAD gave off one large branching diagonal.  It was angiographically  normal.   Left circumflex was made up primarily of an OM1.  There was also a  moderate-sized ramus branch that was angiographically normal.   RCA was a large dominant vessel giving off a PDA and posterolateral was  angiographically normal.   Left ventriculogram done in the RAO position showed an EF of 50-55% with  no regional wall motion  abnormalities.  No mitral regurgitation.   ASSESSMENT:  1. Normal coronary arteries.  2. Normal left ventricular function.   Plan will be for medical therapy.      Bevelyn Buckles. Bensimhon, MD  Electronically Signed     DRB/MEDQ  D:  07/29/2008  T:  07/29/2008  Job:  [id number]

## 2010-10-16 NOTE — Progress Notes (Signed)
[facility] OFFICE [facility]   [name], [name]                    MRN:          [id number]  DATE:02/15/2008                            DOB:          05-Mar-1971    Angela [name] was seen in followup for complete heart block following  AV nodal modification.   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  She is very-very tired.  She is having  problems with dizziness, this is both orthostatic in nature as well as  occur when she is sitting.  This is much worse in school which started  during the summer.  There are very few symptoms with his.   She has had a problem with borderline blood pressures in the past with  diastolics in the high 80s and so salt and water repletion has not been  a good option.   She had been tried on beta-blockers in the past, did not tolerate  ATENOLOL and TOPROL, and has had to be on progressively lower doses of  propranolol and is currently taking LA 40.   PHYSICAL EXAMINATION:  VITAL SIGNS:  Her blood pressure is 130/90 and  her pulse was 87.  LUNGS:  Clear.  HEART:  Sounds were regular.  EXTREMITIES:  Without edema.   Interrogation of her pacemaker demonstrates that she is 100%  ventricularly paced.  She does have an escape rhythm.  The P-wave was  2.7 with impedance of 360, a threshold of 0.5 at 0.4; and the R-wave was  7.5 with impedance of 472, a threshold of 1 volt at 0.4.  Battery  voltage was 3.00.   IMPRESSION:  1. Complete heart block, status post atrioventricular nodal      modification.  2. Status post __________for #1.  3. Fatigue.  4. Dizziness related to fatigue.  5. Borderline blood pressure.  6. Intolerance to previous BETA-BLOCKERS.  I have tried to encourage      [name] to get sleep and get some rest.   We  will give her some different beta-blockers to try and metoprolol  tartrate 25, nadolol 20, and acebutolol 200.  She is to call us and let  us know if she tolerates these any better.    Duke Salvia, MD, [facility]  Electronically Signed   SCK/MedQ  DD: 02/15/2008  DT: 02/16/2008  Job #: [id number]

## 2010-10-19 NOTE — Letter (Signed)
April 17, 2006    Dr. Adriana Reams   RE:  Angela [name], Angela [name]  MRN:  [id number]  /  DOB:  16-Feb-1971   Dr. Loraine Leriche,   Angela [name] comes in today.  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  Her blood pressure is also  much improved with recordings of 90 to 115 at home.  This is on propranolol  80 a day.  On examination, her blood pressure today was 108/75 with just a  little bit of orthostasis.  Lungs were clear, heart sounds were regular.   Interrogation of her pacemaker demonstrates a recent increase in activity.  She remains in complete heart block.   We will plan to give her Septra one p.o. b.i.d. for 14 days.  I have  suggested that she take some Benadryl for her allergies as well.  We will  see her again in 6 months' time.   We will also discontinue her propranolol, initially wean her to every other  day, and make sure her blood pressures are okay.   Loraine Leriche, if she needs something for her infections, please feel free to give  her anything that does not have a decongestant in it.   If I can be of any further assistance, do not hesitate to call me.  My  office back door number is [phone] and my cell is [phone].  Thanks.    Sincerely,      Duke Salvia, MD, [facility]  Electronically Signed    SCK/MedQ  DD: 04/17/2006  DT: 04/17/2006  Job #: [id number]

## 2010-10-19 NOTE — Letter (Signed)
Oct 28, 2006    Dr. Angelia Mould. Ingram  [address]  [place], [place]  [address]   RE:  SKARLETT, SEDLACEK  MRN:  [id number]  /  DOB:  27-Oct-1970   Dear Mikey Bussing,   Ms. Sylvia has a pacemaker for iatrogenic complete heart block related to  an ablation procedure.   She is pacemaker dependent. Electrocautery may result in inhibition of  her device in the absence of a magnet being applied pre-procedurally.  Apart from that, she is at acceptable risk to undergo surgery. If she is  going to be done at [facility] please let me know when she is going to be done  so that I can be available.   If I can be of any further assistance please do not hesitate to contact  me.    Sincerely,      Duke Salvia, MD, [facility]  Electronically Signed    SCK/MedQ  DD: 10/28/2006  DT: 10/28/2006  Job #: [id number]

## 2010-10-19 NOTE — H&P (Signed)
NAMECHERRYL, BABIN             ACCOUNT NO.:  [id number]   MEDICAL RECORD NO.:  [id number]          PATIENT TYPE:  AMB   LOCATION:  DAY                          FACILITY:  [facility]   PHYSICIAN:  Timothy P. Fontaine, M.D.DATE OF BIRTH:  Jun 16, 1970   DATE OF ADMISSION:  03/12/2004  DATE OF DISCHARGE:                                HISTORY & PHYSICAL   CHIEF COMPLAINT:  Stress incontinence, menorrhagia.   HISTORY OF PRESENT ILLNESS:  A 40 year old G2, P2, status post laparoscopic  tubal sterilization with worsening stress incontinence and menorrhagia for  combined HTA endometrial ablation and urologic bladder suspension by Dr.  Isabel Caprice. The patient notes that her menses have gotten progressively gotten  worse, in fact it is now incapacitating to her. Her outpatient evaluation  included a normal thyroid function, a negative sonohistogram, and a negative  endometrial biopsy. The patient was offered various alternatives for control  of her menorrhagia and she elects for HTA endometrial ablation.   PAST MEDICAL HISTORY:  Supraventricular tachycardia.   PAST SURGICAL HISTORY:  1.  Laparoscopic tubal sterilization.  2.  Benign lumpectomy of her breast.   CURRENT MEDICATIONS:  1.  Verapamil p.r.n. for her tachycardia.  2.  Cymbalta anxiolytic.   ALLERGIES:  None.   REVIEW OF SYSTEMS:  Noncontributory.   SOCIAL HISTORY:  Noncontributory.   ADMISSION HISTORY EXAMINATION:  VITAL SIGNS: Afebrile. Vital signs are  stable.  HEENT: Normal.  LUNGS: Clear.  CARDIAC: Regular rate without murmurs, rubs, or gallops.  ABDOMEN: Benign.  PELVIC EXAM: External BUS and vagina normal. Cervix normal. Uterus normal  size, shape, midline, mobile, and nontender. Adnexa without masses or  tenderness.   ASSESSMENT:  A 40 year old G2, P2 female, status post tubal sterilization,  increasing menorrhagia, stress incontinence for combined endometrial  ablation and bladder suspension. I reviewed with her  the issues of  endometrial ablation. She understands that she should never get pregnant  following the procedure and even though she has had a tubal ligation she  should not consider reversal or other assisted technology. She also  understands the issues of endometrial carcinoma and that ablations may  interfere with the diagnosis in the future. She understands and accepts  this. She understands the procedure is machine dependent and if there is a  malfunction HTA machine that we may have to reschedule her procedure. The  acute intraoperative and postoperative courses were reviewed. I reviewed  what is involved with the procedure to include the use of hot water, risks  of infection, hemorrhage, uterine perforation, damage to internal organs  including bowel, bladder, ureters, vessels, and nerves necessitating major  exploratory reparative surgeries and future reparative surgeries including  ostomy formation. She understands that if perforation occurs during the  procedure that we would have to terminate the procedure at that time and  reschedule at a future date. She also understands due to the hot fluid she  is at risk of burning injuries both internal and external and accepts these.  She also understands there are no guarantees as far as menorrhagia relief  and that her bleeding may continue heavy  or recur. She understands and  accepts this. Lastly, she understands Dr. Isabel Caprice will be in charge of her  bladder surgery and her postoperative bladder care.      TPF/MEDQ  D:  03/10/2004  T:  03/10/2004  Job:  [id number]

## 2010-10-19 NOTE — Assessment & Plan Note (Signed)
[facility]                                   ON-CALL NOTE   DANN, VENTRESS                    MRN:          [id number]  DATE:12/22/2005                            DOB:          January 26, 1971    Primary Cardiologist:  Dr. Donnie Aho.  ET Physician:  Dr. Graciela Husbands.   SUMMARY OF HISTORY:  Ms. Gallogly sent her AB nodal oblation for re-entry  tachycardia, has been following up with Dr. Graciela Husbands in regards to her pacer.   She calls today stating that she has not felt right in the last 24 hours and  last night, she was unable to sleep.  She has also experienced an anterior  chest heaviness, which she has described as someone punching her in the  chest.  She denies any palpitations.  She has taken her medication of  Propranolol this morning, and states since that time, she has felt better.  She is calling this afternoon, because she was concerned about the chest  discomfort.  She states that approximately one hour ago, her blood pressure  was 130/60, and her heart rate was in the 80's.  She states that when it was  at its worst last night, she did not take her blood pressure or pulse.  She  also states that she has had a stress Myoview performed at [facility], within the last two years.  It was negative for any abnormalities.  She denies any other cardiac risk factors.  She also states she has an  appointment with Dr. Graciela Husbands tomorrow, at 12 o'clock, as that he has been  recently working with her for some pacer problems.  She states that  recently, she has had a 24 hour Holter monitor.  She states that it was  slightly abnormal, but she does know the particulars of that.  What she  describes is that her pacemaker brings down her heart rate from the  ventricles.  However, the top part of her heart continues to beat fast.   I offered Ms. Warr to present to [facility] Emergency Room for further  evaluation; however, she stated that she did not wish to pursue  that avenue.  I instructed her that since her blood pressure and pulse were stable, that  it would be okay for her take an additional Propranolol and to continue to  rest, but also, this afternoon and this evening, to continue to follow her  blood pressure.  If her blood pressure did drop, or she continued to have  symptoms, I recommended that she do call us back, and we would be happy to  see her in the emergency room.  Otherwise, she needs to keep her appointment  with Dr. Graciela Husbands tomorrow at 12 o'clock.  She is agreeable with his plan and  stated that she will call us back if she has any further difficulties.  I  will leave a message on the office voice mail, with the fact that she had  called today.  Joellyn Rued, PA-C                                Farris Has. Dorethea Clan, MD   EW/MedQ  DD:  12/22/2005  DT:  12/22/2005  Job #:  [id number]

## 2010-10-19 NOTE — Letter (Signed)
December 10, 2005     Viann Fish, MD  [phone] 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   When she saw Lewayne Bunting a couple months ago she was noted to be  hypertensive.  This was new since her procedure.  She was started on  clonidine.  She has continued to have problems with palpitations.  These are  not infrequently nocturnal.  There is also significant exercise intolerance  and she has not tolerated the clonidine from a symptom point of view.   Life has been very stressful since the procedure and her fatigue and her  palpitations have made it more so.  There has been some secondary issues  related to her family because of her inability to undertake these tasks.   Should note previously she was on Toprol with a psychotic reaction.   On examination today her blood pressure was again elevated at 144/90 and she  has been checking it at [facility] and at home and she has had a variety of blood  pressures in the 150/100 range.  The lungs were clear.  Heart sounds were  regular.  Extremities were without edema.  Interestingly, on orthostatic  stress her heart rate initially went from 90 to 103 to 119 going from lying  to sitting to standing.  On another occasion when we repeated with blood  pressure measurements initially blood pressure went down, then peaked at 150  only to fall to 110.   IMPRESSION:  1.  Status post __________ modification complicated by complete heart block.  2.  Status post pacer for the above.  3.  Evidence of autonomic dysfunction with now hypertension and postural      tachycardia, question mechanism, question related to the ablation      procedure.   I think for right now, Karleen Hampshire, we need to try and sort  out what is  happening.  I have taken the liberty of sending a TSH as a potential primary  cause for adrenergic overload.  We will have to think about other causes of  this as well.  I am going to get a 24-hour Holter to look for the __________  excursion of her heart rate.   Further, I have given her prescriptions for low-dose beta blockers to see if  she can tolerate any of them and see if they do not help abrogate some of  her symptoms.   I will keep you abreast of how things are going.   Thanks very much for asking [place] to participate in her care.   Sincerely,      Duke Salvia, MD, [facility]   SCK/MedQ  DD:  12/10/2005  DT:  12/10/2005  Job #:  [id number]

## 2010-10-19 NOTE — Op Note (Signed)
[facility]  Patient:    Angela [name], Angela [name] Visit Number: [id number] MRN: [id number]          Service Type: DSU Location: [facility] Attending Physician:  Tonye Royalty Dictated by:   Gaetano Hawthorne. Lily Peer, M.D. Proc. Date: 06/18/01 Admit Date:  06/18/2001                             Operative Report  PREOPERATIVE DIAGNOSIS:       Request for elective permanent sterilization.  POSTOPERATIVE DIAGNOSIS:      Request for elective permanent sterilization.  OPERATION:                    Laparoscopic tubal sterilization procedure bilaterally, Hulka clip technique.  SURGEON:                      Juan H. Lily Peer, M.D.  ASSISTANT:  ANESTHESIA:                   General endotracheal anesthesia.  ESTIMATED BLOOD LOSS:  INDICATIONS:                  A 40 year old gravida 2, para 2, with request for elective permanent sterilization.  The patient currently breastfeeding and using barrier contraception.  Serum pregnancy test preoperatively two days ago was negative.  FINDINGS:                     Normal maternal pelvic anatomy.  DESCRIPTION OF PROCEDURE:     After the patient was adequately counseled, she was taken to the operating room where she underwent a successful general endotracheal anesthesia.  She was placed in the low lithotomy position and the abdomen, vagina, and perineum were prepped and draped in the usual sterile fashion.  Examination under anesthesia confirmed anteverted uterus, normal size, shape, and consistency with no adnexal masses.  A Hulka tenaculum was placed for manipulation of the uterus during laparoscopic procedure.  After the drapes were in place, a small stab incision was made in the infraumbilical region followed by insertion of the Veress needle.  Opening intra-abdominal pressure was 8 mmHg and approximately 3 liters of carbon dioxide were insufflated into the peritoneal cavity.  The Veress needle was removed.  A  10 mm operative laparoscope was inserted.  A second puncture site under laparoscopic guidance was made approximately 2 cm above the symphysis pubis in the midline whereby a 5 mm trocar was inserted.  Inspection of the entire pelvic cavity did not demonstrate any evidence of pelvic adhesions or endometriosis, normal tubes and ovaries, and normal serosal surface of the uterus, normal liver surface, and appendix was not visualized.  Attention was placed then to the right adnexal region whereby the distal fallopian tube was grasped with a self-retaining laparoscopic grasper under tension.  A Hulka clip was placed on the proximal 1/3 portion of the fallopian tube completely occluding that segment of the tube.  A similar procedure was carried out on the contralateral side.  Pictures before and after the procedure were obtained.  The instruments were removed after the carbon dioxide was removed from the peritoneal cavity.  The subumbilical fascial incision was closed with a figure-of-eight suture of 3-0 Vicryl suture and the skin incision was closed with Dermabond glue as well as the 5 mm suprapubic skin incision site.  For postoperative analgesia, 0.25% Marcaine was infiltrated in both  incision sites for a total of 10 cc.  She was given 30 mg of Toradol and brought to the recovery room.  She was extubated and transferred to the recovery room with stable vital signs.  Estimated blood loss was minimal.  Fluid resuscitation consisted of 2300 cc of Ringers lactate.  A Hulka tenaculum was removed before transporting the patient to the recovery room.  Sponge, needle, and instrument counts were correct. Dictated by:   Gaetano Hawthorne Lily Peer, M.D. Attending Physician:  Tonye Royalty DD:  06/18/01 TD:  06/19/01 Job: [id number]

## 2010-10-19 NOTE — Op Note (Signed)
NAMESUZANNA, Angela [name]             ACCOUNT NO.:  [id number]   MEDICAL RECORD NO.:  [id number]          PATIENT TYPE:  OIB   LOCATION:  2807                         FACILITY:  [facility]   PHYSICIAN:  Doylene Canning. Ladona Ridgel, M.D.  DATE OF BIRTH:  1970/07/27   DATE OF PROCEDURE:  08/14/2005  DATE OF DISCHARGE:                                 OPERATIVE REPORT   PROCEDURE PERFORMED:  Electrophysiologic study and catheter ablation of AV  node re-entrant tachycardia.   INTRODUCTION:  The patient is a very pleasant 40 year old woman with a 2-  year history of recurrent tachy palpitations and documented supraventricular  tachycardia at rates of over 210 beats per minute.  She has been on medical  therapy.  About a month ago she had recurrence of her SVT despite  medications and is now referred for electrophysiologic study and catheter  ablation.   PROCEDURE:  After informed consent was obtained, the patient was taken to  the diagnostic electrophysiology laboratory in a fasting state.  After the  usual preparation and draping, intravenous fentanyl and Midazolam were given  for sedation.  A 6 French hexapolar catheter was inserted percutaneously in  the right jugular vein and advanced to the right coronary sinus.  A 5 French  quadripolar catheter was inserted percutaneously in the right femoral vein  and advanced to the RV apex.  A 5 French quadripolar catheter was inserted  percutaneously in the right femoral vein and advanced to the His bundle  region.  After measurement of the basic intervals, rapid ventricular pacing  was carried out from the RV apex at pace cycle length of 600 msec and  stepwise decreased down to 300 msec where VA Wenckebach was observed.  During rapid ventricular pacing at cycle length of 300 msec, there was  inducible SVT.  During rapid ventricular pacing the atrial activation was  midline and decremental.  Next programmed ventricular stimulation was  carried out from the RV apex  at basic drive cycle length of 409 msec.  The  S1-S2 interval was stepwise decreased down to 230 msec where ventricular  refractoriness was observed.  During programmed ventricular stimulation the  atrial activation was midline and decremental.  Next, programmed atrial  stimulation was carried out from the coronary sinus at a basic drive cycle  length oif 811 msec.  The S1-S2 interval was stepwise decreased to 440 msec  down to 260 msec where the AV node ERP was observed.  During programmed  atrial stimulation there were multiple AH jumps and echo beats as well as  inducible SVT.  Next, rapid atrial pacing was carried out from the coronary  sinus and high right atrium pace cycle lengths of 590 msec and stepwise  decreased down to 290 msec where AV Wenckebach was observed.  During rapid  atrial pacing, the PR interval was greater than the RR interval and there  was easily inducible SVT.  It should be noted that during SVT, the cycle  length varied between 290 and 350 msec.  PVCs were placed at time of His  bundle refractoriness and they did not pre-excite the  atrium and ventricular  pacing demonstrated a VAV conduction sequence.  The atrial activation during  tachycardia was midline and the VA interval was essentially 0.  With all of  the above, the diagnosis of AV nodal re-entrant tachycardia was made and the  ablation catheter was maneuvered into the usual Koch's triangle.  This was  of normal size and orientation.  The first radio frequency energy  application was delivered for approximately 20 seconds and demonstrated  accelerated junction rhythm.  There was a V within an A and RF was  immediately terminated.  A second RF energy application was delivered.  Seven seconds into the RF energy application, a V without an A during  junctional rhythm was again observed and RF was discontinued.  This was  followed by PR prolongation followed by complete heart block.  The patient  maintained  complete heart block with a junctional escape rhythm of 60 to 70  beats per minute for nearly 30 minutes.  Just prior to the patient being  prepped for permanent pacemaker insertion, her AV conduction returned and  was stable.  The patient was observed for another 30 minutes and had stable  AV conduction.  The catheters were then removed.  Hemostasis was assured and  the patient was returned to her room in satisfactory condition.   COMPLICATIONS:  The procedure was complicated by transient complete heart  block which resolved spontaneously.   a.  Baseline ECG.  The baseline ECG demonstrated sinus rhythm with normal  axis and intervals. There was no pre-excitation.  b.  Baseline intervals.  Sinus node cycle length 699 msec.  AH interval was  77 msec. HV interval 36 msec.  QRS duration was 90 msec.  c.  Rapid ventricular pacing.  Rapid ventricular pacing was carried out from  the RV apex at 600 msec, stepwise decreased down to 290 msec where VA  Wenckebach was observed.  During rapid ventricular pacing, the atrial  activation was midline and decremental.  At a pacing interval of 300 msec  there was inducible SVT.  d.  Programmed ventricular stimulation.  Programmed ventricular stimulation  was carried out from the right ventricular apex at basic drive cycle length  of 403 msec.  The S1-S2 interval was stepwise decreased from 400 msec down  to 230 msec where ventricular refractoriness was observed.  During  programmed ventricular stimulation, the atrial activation was midline and  decremental.  e.  Rapid atrial pacing.  Rapid atrial pacing was carried out from the  coronary sinus and the high right atrium at basic drive cycle lengths of 474  msec.  The pace cycle length was stepwise decreased down to 290 msec where  AV Wenckebach was observed.  During rapid atrial pacing the PR interval was  greater than the RR interval and there was inducible SVT. f.  Programmed atrial stimulation.   Programmed atrial stimulation was  carried out from the coronary sinus and the high right atrium at basic drive  cycle lengths of 259 msec.  The S1-S2 interval was stepwise decreased from  440 msec down to 260 msec where the AV node ERP was observed.  During  programmed atrial stimulation there were multiple AH jumps and echo beats  and inducible SVT.  g.  Arrhythmias observed.  (1) AV node re-entrant tachycardia.  Initial  rapid ventricular pacing, programmed atrial stimulation, and rapid atrial  pacing.  Duration was sustained.  Cycle length varied between 290 and 350  msec.  Method of  termination was ventricular and CS pacing.  h.  Mapping.  Mapping of Koch's triangle demonstrated the usual size and  orientation for radio frequency energy application.  Two RF energy  applications were delivered resulting in accelerated junctional rhythm  followed by transient complete heart block which resolved spontaneously.   CONCLUSION:  This study demonstrates successful electrophysiologic study and  radio frequency catheter ablation of AV node re-entrant tachycardia with two  RF energy applications delivered to the slow pathway region in Koch's  triangle.  Despite being a long ways from the AV node, during RF energy  application, the procedure was complicated by transient complete heart block  with junctional escape at 70 beats per minute.  This resolved spontaneously.           ______________________________  Doylene Canning. Ladona Ridgel, M.D.     GWT/MEDQ  D:  08/14/2005  T:  08/15/2005  Job:  [id number]   cc:   Georga Hacking, M.D.  Fax: [phone]  Email: [email or web address]   Timothy P. Fontaine, M.D.  Fax: [phone]

## 2010-10-19 NOTE — Discharge Summary (Signed)
[facility]  Patient:    Angela [name], Angela [name] Visit Number: [id number] MRN: [id number]          Service Type: OBS Location: 910A 9103 01 Attending Physician:  Douglass Rivers Dictated by:   Antony Contras, [facility] Proc. Date: 03/09/01 Admit Date:  03/09/2001 Discharge Date: 03/11/2001                             Discharge Summary  DISCHARGE DIAGNOSES:          1. Intrauterine pregnancy at 37-5/7 weeks.                               2. History of depression.                               3. Spontaneous onset of labor.  PROCEDURES:                   Normal spontaneous vaginal delivery of                               viable infants over intact perineum with                               repair of second degree laceration.  HISTORY OF PRESENT ILLNESS:   The patient is a 40 year old gravida 2, para 1-0-0-1, with an LMP of June 20, 2000, [facility] March 27, 2001. Prenatal risk factors include a history of depression for which the patient was on Paxil 30 mg q.d.  PRENATAL LABORATORY DATA:     Blood type A positive, antibody screen negative, RPR, HBsAg, HIV nonreactive. MSAFP normal.  HOSPITAL COURSE/TREATMENT:    The patient was admitted on March 09, 2001 with spontaneous onset of labor. She progressed to complete dilatation and delivered an Apgar 9/9 female infant weighing 7 pounds 12 ounces over an intact perineum with repair of second degree laceration.  POSTPARTUM COURSE:            She remained afebrile, had no difficulty voiding, and was able to be discharged on her second postpartum day in satisfactory condition.  CBC:  Hematocrit 31.7, hemoglobin 11.2, WBCs 12.3, platelets 207,000.  DISPOSITION:                  The patient is to follow up in six weeks.  DISCHARGE MEDICATIONS:        The patient is to continue prenatal vitamins and iron with Motrin and Tylox for pain. The patient is also to continue with Paxil 30 mg daily. Dictated by:    Antony Contras, [facility] Attending Physician:  Douglass Rivers DD:  03/27/01 TD:  03/30/01 Job: [id number]

## 2010-10-19 NOTE — Assessment & Plan Note (Signed)
[facility]                           ELECTROPHYSIOLOGY OFFICE NOTE   ANNMARGARET, DECAPRIO                    MRN:          [id number]  DATE:12/23/2005                            DOB:          [birth year]    Ms. Sciortino comes in today because of a Holter monitor the last week that  demonstrated unexplained termination of P synchronous pacing.  On further  evaluation, it appears that she violated her TARP, and then had then  pseudofusion for a number of beats prior to resumption of pacing.  This was  reprogrammed by increasing her upper rate limit to 150 and decreasing her  PVARP to 250.   She has also complained of chest pain that developed on Saturday.  It is  pleuritic.  It is aggravated by lying, improved by sitting.  It is better  today than yesterday.  On auscultation, she had no rub.  Her blood pressure  was in fact better with a 126/62.  She is tolerating the [place] LA 80 quite  well.  We will plan to get a chest CT scan today to exclude significant  pulmonary embolism and/or pericardial effusion.                                   Duke Salvia, MD, [facility]   SCK/MedQ  DD:  12/23/2005  DT:  12/23/2005  Job #:  [id number]

## 2010-10-19 NOTE — Assessment & Plan Note (Signed)
[name]                         ELECTROPHYSIOLOGY OFFICE NOTE   Angela [name]                    MRN:          [id number]  DATE:06/17/2006                            DOB:          23-Mar-1971    Angela [name] returns today for follow up. She is a very pleasant young lady  with a history of symptomatic SVT status post SVT ablation complicated  by the development of high-grade heart block for which she underwent  permanent pacemaker insertion. The patient was in her usual state of  health until earlier today when she was in an alternation with a child.  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 The patient has soreness here and is here for additional  evaluation.   Her medications include Wellbutrin and propranolol.   Otherwise, on exam, she is a pleasant, well-appearing, young lady in no  distress. Blood pressure was 150/90, and the pulse 81 and regular. The  respirations were 18. The weight was 146 pounds.  NECK:  Revealed no jugular venous distention.  LUNGS:  Were clear bilaterally to auscultation.  CARDIOVASCULAR:  Revealed a regular rate and rhythm with normal S1 and  S2.  EXTREMITIES:  Demonstrated no edema.   Evaluation of her pacemaker insertion site demonstrated it was nicely  healed with no hematoma and no laceration.   Interrogation of her pacemaker demonstrates a Medtronic EnRhythm with P  and R waves of 2 and 9. Impedence is 376 in the atrium, 488 in the  ventricle with a threshold of 0.5 at 0.3 in the atrium and 1 at 0.4 in  the ventricle. Battery voltage was greater than 3.   IMPRESSION:  1. Supraventricular tachycardia status post ablation.  2. High-grade heart block secondary to #1.  3. Recent contusion to the area of the pacemaker pocket without any      obvious finding on physical examination.   DISCUSSION:  Overall, Angela [name] is stable. I do not think she has done  any significant damage to her pacemaker. There appears to be no hematoma  in the area over the left pectoral  region of the chest and there are no lacerations of the skin. We will  plan to see the patient back in the office as previously scheduled. She  is instructed to call us if she sees additional problems.     Doylene Canning. Ladona Ridgel, MD  Electronically Signed    GWT/MedQ  DD: 06/17/2006  DT: 06/18/2006  Job #: [id number]   cc:   Georga Hacking, M.D.

## 2010-10-19 NOTE — Discharge Summary (Signed)
NAMEJESSALYNN, Angela [name]             ACCOUNT NO.:  [id number]   MEDICAL RECORD NO.:  [id number]          PATIENT TYPE:  INP   LOCATION:  3728                         FACILITY:  [facility]   PHYSICIAN:  Maple Mirza, P.A. DATE OF BIRTH:  [birth year]   DATE OF ADMISSION:  08/14/2005  DATE OF DISCHARGE:  08/20/2005                                 DISCHARGE SUMMARY   ALLERGIES:  TAMBOCOR.   PRINCIPAL DIAGNOSES:  1.  Discharging day #1 status post implantation of Medtronic EnRhythm dual      chamber pacemaker for complete heart block post radiofrequency catheter      ablation.  2.  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  3.  History of recurrent tachy palpitations with symptoms including      dizziness, fatigue and dyspnea.  4.  Attempted steroid therapy to reduce swelling impinging on      atrioventricular node did not resolve complete heart block.  5.  Even though patient was in complete heart block, she had junctional      escape rhythm.   SECONDARY DIAGNOSES:  1.  Depression.  2.  Status post uterine ablation for fibroids.  3.  Status post right breast lumpectomy.  4.  Status post tubal ligation.   PROCEDURES:  1.  August 14, 2005, electrophysiology study radiofrequency catheter ablation      of AV nodal reentry tachycardia with successful slow P-wave      modification, Gregg W. Ladona Ridgel, M.D.  Patient develops complete heart      block post procedure, which lasted 30 minutes and resolved.  2.  August 19, 2005, implantation of Medtronic dual chamber pacemaker, Duke Salvia, M.D.   BRIEF HISTORY:  Ms. Louderback is a 40 year old female.  She has a history of  recurrent tachy palpitations over the last two years.  In 2005, she had a  documented supraventricular tachycardia with rates of 212 beats per minute.  At that time, she was evaluated, she had normal left ventricular function  by  echocardiogram, no significant valvular abnormalities.  The patient was  initially treated with Rythmol.  She eventually saw W. Viann Fish, M.D.,  and was maintained successfully on Verapamil.   Four weeks ago, she had a recurrent episode of her SVT to rates of 170 beats  per minute on Verapamil.  She was referred to electrophysiology for  additional evaluation.  Patient notes that when her heart begins to race, it  will stop suddenly.  It has been terminated with adenosine twice.  During  the events, she has dizziness, fatigue and shortness of breath. She feels  fatigue, weakness and constipation with ongoing Verapamil.  The treatment  options were discussed by Dr. Ladona Ridgel.  The risks, benefits, goals and  expectations have been discussed with the patient and she would like to  proceed.   HOSPITAL COURSE:  Ms.  Ladouceur presented August 14, 2005, electively for  electrophysiology study.  She had radiofrequency catheter ablation of an AV  nodal reentry tachycardia.  She  developed complete heart block after the  procedure. This lasted 30 minutes and seemed to be resolving. Overnight,  however, the patient lapsed back into complete heart block. She had  junctional escape with heart rates in the 50s and 60s.  She was started on  IV Solu-Medrol to reduce swelling in the hopes that AV nodal conduction  would be restored.  Steroid therapy, however, was unable to restore AV nodal  integrity and the patient presented for pacemaker insertion August 19, 2005.  A dual chamber pacemaker was placed.  She is A sensing and V pacing at the  time of discharge.  Patient is severely depressed and tearful.  At the time  of discharge, she says she feels some fluttery feelings which Dr. Ladona Ridgel has  assured her ascribed to premature complexes which are not subject to therapy  and have been existent prior to AV nodal ablation therapy.  Patient has  maintained sinus rhythm in the post pacer implant period.   The device has  been interrogated and is functioning appropriately.  Chest x-ray has been  examined and shows that the leads are in appropriate position and there is  no pneumothorax.  Patient is having some soreness at the pacemaker site.  She has some aggravation of gastric upset with narcotic medications in the  16 hours post pacemaker insertion.  This is now resolving at the time of  discharge.  Patient is asked not to drive for the next two days, not to lift  anything heavier than 10 pounds for the next two weeks. She is asked not to  shower for the next week, keep her incision dry until Monday, August 26, 2005.   She goes home on the following medications:  1.  Wellbutrin XR 300 mg daily.  2.  Xanax 0.25 mg one tablet q.8h. p.r.n.  3.  She may stop her Verapamil.  4.  She is also to start enteric coated aspirin 81 mg daily for the next six      weeks.  5.  Note, if she plans dental work, even just teeth cleaning, through August      2007, she is to call [phone] for antibiotic coverage.  6.  She also may take Motrin for musculoskeletal pain in the pacemaker site.   She will follow up at [facility], [address] pacer  clinic on Monday, September 02, 2005, at 9:30.  She will see Dr. Graciela Husbands on  Tuesday, December 10, 2005, at 9:40.   Preoperative laboratory studies taken on August 13, 2005, show complete blood  count with white cells of 5, hemoglobin 13.9, hematocrit 39.8, platelets  279.  The PPT was 30.6, protime 12.4, INR 1.  Serum electrolytes with sodium  142, potassium 3.9, chloride 108, carbonate 28, glucose 87, BUN 5,  creatinine 1.      Maple Mirza, P.A.     GM/MEDQ  D:  08/20/2005  T:  08/21/2005  Job:  [id number]   cc:   Duke Salvia, M.D.  [address]  [address]  [place]  [place] [address]   W. Viann Fish, M.D.  Fax: [phone]  Email: [email or web address]   Timothy P. Fontaine, M.D. Fax: [phone]

## 2010-10-19 NOTE — Op Note (Signed)
Angela [name], Angela [name]             ACCOUNT NO.:  [id number]   MEDICAL RECORD NO.:  [id number]          PATIENT TYPE:  INP   LOCATION:  3728                         FACILITY:  [facility]   PHYSICIAN:  Duke Salvia, M.D.  DATE OF BIRTH:  26-May-1971   DATE OF PROCEDURE:  08/19/2005  DATE OF DISCHARGE:                                 OPERATIVE REPORT   PREOPERATIVE DIAGNOSIS:  Complete heart block following AV nodal slow  pathway modification.   POSTOPERATIVE DIAGNOSIS:  Complete heart block following AV nodal slow  pathway modification.   PROCEDURE:  Dual chamber pacemaker implantation.   DESCRIPTION OF PROCEDURE:  Following the obtaining of informed consent, the  patient was brought to the electrophysiology laboratory and placed on the  fluoroscopic table in supine position.  After routine prep and drape of the  left upper chest, lidocaine was infiltrated in the prepectoral groove.  An  incision was made and carried down to the prepectoral fascia using  electrocautery and sharp dissection.  A pocket was formed similarly.  Hemostasis was obtained.   Thereafter, attention was turned to gaining access to the extrathoracic left  subclavian vein which was accomplished without difficulty and without the  aspiration of air or puncture of the artery.  Two guidewires were placed and  retained.  Sequentially, 7 French sheaths were placed through which were  passed Medtronic 5076 52 cm active fixation ventricular lead, serial number  [id number] and a Medtronic 5076 45 cm lead, serial number [id number].  The  ventricular lead was marked with a tie.  Under fluoroscopic guidance the  right ventricular lead was manipulated with great difficulty to the right  ventricular where in LAO view could be seen in perpendicular fashion.  In  this location, bipolar R wave was 7.2 mV with pacing impedance of 782n ohms,  a threshold of 0.7 V at 0.5 msec.  Current at threshold was 1.2 mA.  The  atrial lead was  manipulated to the right atrial appendage where the bipolar  P wave was 3.2 mV with a pacing impedance of 1247 ohms, threshold 0.6 V at  0.5 msec.  Current at threshold was 0.7 mA.  With these successful  parameters recorded, the leads were secured to the prepectoral fascia and  then attached to the Medtronic Enrhythm [id number] pulse generator, serial  number [id number].  Through the device, P synchronous pacing was identified.   The pocket was copiously irrigated with antibiotic containing saline  solution.  Hemostasis was assured, the leads in the pulse generator were  placed in the pocket and secured to the prepectoral fascia.  The wound was  then closed in  three layers in normal fashion.  The wound was the washed and dried and a  benzoin and Steri-Strip dressing was applied.  Sponge, needle and instrument  counts were correct at the end of the procedure according to the staff.  The  patient tolerated the procedure without apparent complication.           ______________________________  Duke Salvia, M.D.     SCK/MEDQ  D:  08/19/2005  T:  08/20/2005  Job:  [id number]   cc:   electrophys lab   [place] pacemaker cl   W. Viann Fish, M.D.  Fax: [phone]  Email: [email or web address]

## 2010-10-19 NOTE — H&P (Signed)
[facility]  Patient:    Angela [name], Angela [name] Visit Number: [id number] MRN: [id number]          Service Type: Attending:  Gaetano Hawthorne. Lily Peer, M.D. Dictated by:   Gaetano Hawthorne Lily Peer, M.D.                           History and Physical  CHIEF COMPLAINT:              Request for elective form of sterilization.  HISTORY OF PRESENT ILLNESS:   Patient is a 40 year old, gravida 2, para 2, who was seen in the office on June 09, 2001, for a preoperative consultation due to the fact that she had requested for an elective permanent sterilization. She had previously been provided with literature information and a laparoscopic tubal sterilization procedure and is scheduled to undergo such procedure on Thursday, June 18, 2001, at 7:30 a.m. at [facility].  PAST MEDICAL HISTORY:         Patient has had previous laparoscopy in 1990 for ovarian cyst.  She denies any allergies.  She does suffer from mild depression.  She is currently tapering off on her Paxil which she was taking 40 mg q. daily and is down to 30 mg in the past couple of days, and a refill had previously been given.  She is also on prenatal vitamins and she is breast-feeding.  Patient is status post normal spontaneous vaginal delivery on March 09, 2001.  Other previous pregnancy was in 1999 which was also delivered vaginally, as well.  PHYSICAL EXAMINATION:  GENERAL:                      A well-developed, well-nourished female.  HEENT:                        Unremarkable.  NECK:                         Supple.  Trachea midline.  No carotid bruits. No thyromegaly.  LUNGS:                        Clear to auscultation without rhonchi or wheezes.  HEART:                        Regular rate and rhythm.  No murmurs or gallops.  BREASTS:                      Exam was not done.  ABDOMEN:                      Soft, nontender, without rebound or guarding.  PELVIC:                        Bartholins, urethral, Skenes within normal limits.  Vagina and cervix - no gross lesions or discharge.  Uterus anteverted, normal size, shape, and consistency.  Admexa without mass or tenderness.  RECTAL:                       Exam deferred.  ASSESSMENT:                   A 40 year old, gravida 2, para 2, with request for elective  permanent sterilization.  Risks, benefits, pros, and cons of the operation were discussed with the patient to include a failure rate of 1 to 600 and 1 to 800.  Patient understands this is a permanent sterilization and she will not be able to have any more children.  In the event of any technical difficulty in gaining access into the abdominal peritoneal cavity, an open laparotomy may need to be performed in order to gain access to the fallopian tube.  Also, in the event of any trauma to the internal organs such as the bladder, intestines, blood vessels, or other intra-abdominal structures which may require emergency open laparotomy were discussed with the patient.  All questions were answered and we will follow accordingly.  PLAN:                         Patient is scheduled for a laparoscopic tubal sterilization procedure on Thursday, January 16, at 7:30 a.m. at [facility]. Dictated by:   Gaetano Hawthorne. Lily Peer, M.D. Attending:  Gaetano Hawthorne. Lily Peer, M.D. DD:  06/16/01 TD:  06/16/01 Job: [id number]

## 2010-10-19 NOTE — Op Note (Signed)
Angela, [name]             ACCOUNT NO.:  [id number]   MEDICAL RECORD NO.:  [id number]          PATIENT TYPE:  OBV   LOCATION:  0448                         FACILITY:  [facility]   PHYSICIAN:  Valetta Fuller, M.D.  DATE OF BIRTH:  23-Jun-1970   DATE OF PROCEDURE:  03/12/2004  DATE OF DISCHARGE:                                 OPERATIVE REPORT   PREOPERATIVE DIAGNOSIS:  Stress urinary incontinence.   POSTOPERATIVE DIAGNOSIS:  Stress urinary incontinence.   PROCEDURE PERFORMED:  Transobturator suburethral sling.   SURGEON:  Dr. Isabel Caprice   ANESTHESIA:  General.   INDICATIONS:  Angela [name] is a 40 year old female who has had some stress  urinary incontinence status post her second pregnancy.  This does interfere  with her quality of life.  She was documented to have some urethral  hypomobility with some objective stress incontinence.  The patient has had  menorrhagia and was scheduled by Dr. Audie Box to have an ablative procedure  of her uterus.  It was felt that she may benefit from concurrent anti-  incontinent surgery.  The patient was evaluated in our office and again  deemed to be a reasonable candidate for suburethral sling.  She appeared to  understand the advantages and disadvantages to this approach as well as the  success rates, potential complications.  The patient elected to proceed with  an anti-incontinence procedure under the same anesthetic.   TECHNIQUE AND FINDINGS:  The patient was brought to the operating room where  she had successful induction of general anesthesia.  Dr. Audie Box initiated  her procedure.  He performed his procedure which will be dictated  separately.  At the time we entered the operating room, the patient was  already in a moderate lithotomy position and prepped and draped in the usual  manner.  A Foley catheter was indwelling.  We made sure her bladder was  completely emptied.  A weighted vaginal speculum was utilized.  The anterior  vaginal  mucosa was infiltrated with some Marcaine, and a small incision was  made overlying the mid urethra.  With a combination of sharp and blunt  dissective technique, we dissected the vaginal flaps to access a space large  enough for passage of the needle passers.  We marked two puncture sites at  approximately the level of the clitoris about 4.5-5 cm lateral on both  sides.  Small stab incisions were made.  With direct digital finger control,  the needle passers were then placed.  Great care was taken to make sure that  we did not puncture through the vaginal mucosa.  We felt that positioning  was excellent.  The sling was then attached to the needle passers on both  sides and brought out at the level of the mid urethra.  A right-angle clamp  was placed underneath the sling to confirm good tension.  Redundant sling  was then transected.  We elected to put a very small piece of Surgicel  within the dissected cavity to assist with hemostasis as well as to provide  some additional support.  A very small amount of redundant vaginal  mucosa  was trimmed, and the vaginal incision was closed with a running 2-0 Vicryl  suture.  Some Bacitracin-soaked vaginal packing was then placed in  the vagina.  The Foley catheter was left to gravity drainage.  The small  stab incisions for the sling were closed with Dermabond.  The patient  appeared to tolerate the procedure well.  There were no obvious  complications or problems.  She was brought to the recovery room in stable  condition.      DSG/MEDQ  D:  03/12/2004  T:  03/12/2004  Job:  [id number]   cc:   Nadyne Coombes. Audie Box, M.D.  [address], [address]  [place]  [place] [address]  Fax: [phone]

## 2010-10-19 NOTE — Op Note (Signed)
NAMEMALEIGH, [name]             ACCOUNT NO.:  [id number]   MEDICAL RECORD NO.:  [id number]          PATIENT TYPE:  OBV   LOCATION:  0448                         FACILITY:  [facility]   PHYSICIAN:  Timothy P. Fontaine, M.D.DATE OF BIRTH:  [birth year]   DATE OF PROCEDURE:  03/12/2004  DATE OF DISCHARGE:                                 OPERATIVE REPORT   PREOPERATIVE DIAGNOSIS:  Menorrhagia.   POSTOPERATIVE DIAGNOSIS:  Menorrhagia.   PROCEDURE:  HTA endometrial ablation.   SURGEON:  Timothy P. Fontaine, M.D.   ANESTHETIC:  General endotracheal.   COMPLICATIONS:  None.   ESTIMATED BLOOD LOSS:  25 mL.   SPECIMEN:  None.   FINDINGS:  Hysteroscopic adequate normal, noting fundus, anterior and  posterior uterine surfaces, lower uterine segment, endocervical canal, right  and left tubal ostia all visualized.   DESCRIPTION OF PROCEDURE:  The patient was taken to the operating room,  underwent general endotracheal anesthesia, was placed in low dorsal  lithotomy position, received a perineal, lower abdominal, vaginal  preparation with Betadine solution, bladder emptied with in-and-out Foley  catheterization, and the patient draped in the usual fashion.  The cervix  was visualized with a speculum, anterior lip grasped with a single-tooth  tenaculum, and the patient was gently dilated to a #21 dilator.  Initial  attempts at placement of the hysteroscope were unsuccessful, and there was a  tearing of the cervical mucosa from the tenaculum site.  Subsequently this  was switched to the posterior lip and with gentle traction, the HTA  hysteroscope was placed without difficulty.  Hysteroscopy was performed with  normal findings.  Subsequently, the HTA procedure was performed according to  the manufacturer's sequence without difficulty.  After the cooling phase,  the hysteroscope was removed.  There was no evidence of perforation or  significant fluid loss during the procedure.  Due to the  cervical mucosa  tear at 12 o'clock, a 2-0 chromic suture was placed in a running fashion to  close this small defect and achieve ultimate hemostasis.  The instruments  were all then removed, and the patient was now prepared for Dr. Barron Alvine  and his urologic procedure which immediately followed my procedure.     TPF/MEDQ  D:  03/12/2004  T:  03/12/2004  Job:  [id number]

## 2010-10-19 NOTE — Assessment & Plan Note (Signed)
[facility]                         ELECTROPHYSIOLOGY OFFICE NOTE   KAPRI, NERO                    MRN:          [id number]  DATE:09/01/2006                            DOB:          June 11, 1970    Ms. Mccune comes in today following AV ablation inadvertently at the time  of slow pathway modification.   She complains of:  1. Interference of her device with her activities.  She has a left-      sided device, and she is left-handed.  2. Ongoing anger related to the outcome of her procedure.  3. Ongoing depression that dates back to her second child who is now      5, currently worse.  4. Difficulty sleeping, question related to #3.   MEDICATIONS:  Currently include:  1. Inderal 40 p.r.n.  2. Wellbutrin at 300 mg per day.   EXAMINATION:  VITAL SIGNS:  Her blood pressure was 131/94.  Her pulse  was 87.  LUNGS:  Clear.  HEART:  Sounds were regular.  Her pacemaker pocket was well healed.   Interrogation of her device demonstrated that she has 2:1 conduction, a  P-wave of 3 with impedance of 376, a threshold of 0.5 and 0.2.  The R-  wave was 9.9 with impedance of 5, total threshold of 1 volt at 0.4.   IMPRESSION:  1. AV ablation inadvertently during AV nodal slow pathway      modification.  2. Anxiety/depression.  3. Difficulty with sleep.   Ms. Kump has continued to struggle to deal with the adverse outcomes  from her procedure.  I have encouraged her to consider further  counseling and/or consideration for alternative entry, anti-depressant  therapy, as well as sleep management.   She will let us know who is available to her network and we will be glad  to facilitate this in whatever way we can.   Will plan to see her again in one year's time.     Duke Salvia, MD, [facility]  Electronically Signed   SCK/MedQ  DD: 09/01/2006  DT: 09/01/2006  Job #: [id number]

## 2010-11-08 ENCOUNTER — Encounter: Payer: Self-pay | Admitting: *Deleted

## 2010-11-19 ENCOUNTER — Encounter: Payer: Self-pay | Admitting: Internal Medicine

## 2011-01-17 ENCOUNTER — Ambulatory Visit (INDEPENDENT_AMBULATORY_CARE_PROVIDER_SITE_OTHER): Payer: BC Managed Care – PPO | Admitting: *Deleted

## 2011-01-17 ENCOUNTER — Other Ambulatory Visit: Payer: Self-pay | Admitting: Internal Medicine

## 2011-01-17 DIAGNOSIS — Z950 Presence of cardiac pacemaker: Secondary | ICD-10-CM

## 2011-01-17 DIAGNOSIS — I442 Atrioventricular block, complete: Secondary | ICD-10-CM

## 2011-01-18 LAB — REMOTE PACEMAKER DEVICE
BAMS-0001: 180 {beats}/min
BATTERY VOLTAGE: 2.97 V
BRDY-0003RA: 150 {beats}/min
RV LEAD IMPEDENCE PM: 488 Ohm
VENTRICULAR PACING PM: 99.97

## 2011-01-25 NOTE — Progress Notes (Signed)
Pacer remote check  

## 2011-01-30 ENCOUNTER — Encounter: Payer: Self-pay | Admitting: *Deleted

## 2011-02-01 ENCOUNTER — Other Ambulatory Visit: Payer: Self-pay | Admitting: Gynecology

## 2011-02-01 DIAGNOSIS — F329 Major depressive disorder, single episode, unspecified: Secondary | ICD-10-CM

## 2011-02-12 ENCOUNTER — Other Ambulatory Visit: Payer: Self-pay | Admitting: Gynecology

## 2011-02-12 DIAGNOSIS — Z1231 Encounter for screening mammogram for malignant neoplasm of breast: Secondary | ICD-10-CM

## 2011-02-22 ENCOUNTER — Ambulatory Visit: Payer: BC Managed Care – PPO

## 2011-02-25 ENCOUNTER — Ambulatory Visit
Admission: RE | Admit: 2011-02-25 | Discharge: 2011-02-25 | Disposition: A | Discharge status: Home / Self Care | Payer: BC Managed Care – PPO | Source: Ambulatory Visit | Attending: Gynecology | Admitting: Gynecology

## 2011-02-25 DIAGNOSIS — Z1231 Encounter for screening mammogram for malignant neoplasm of breast: Secondary | ICD-10-CM

## 2011-02-25 IMAGING — MG MM DIGITAL SCREENING {BCG}
4 series · 4 of 4 positions shown · non-contrast
Comparison: none

DG SCREEN MAMMOGRAM BILATERAL
Bilateral CC and MLO view(s) were taken.

DIGITAL SCREENING MAMMOGRAM WITH CAD:
There are scattered fibroglandular densities.  No masses or malignant type calcifications are 
identified.  Compared with prior studies.
Images were processed with CAD.

[R CC]
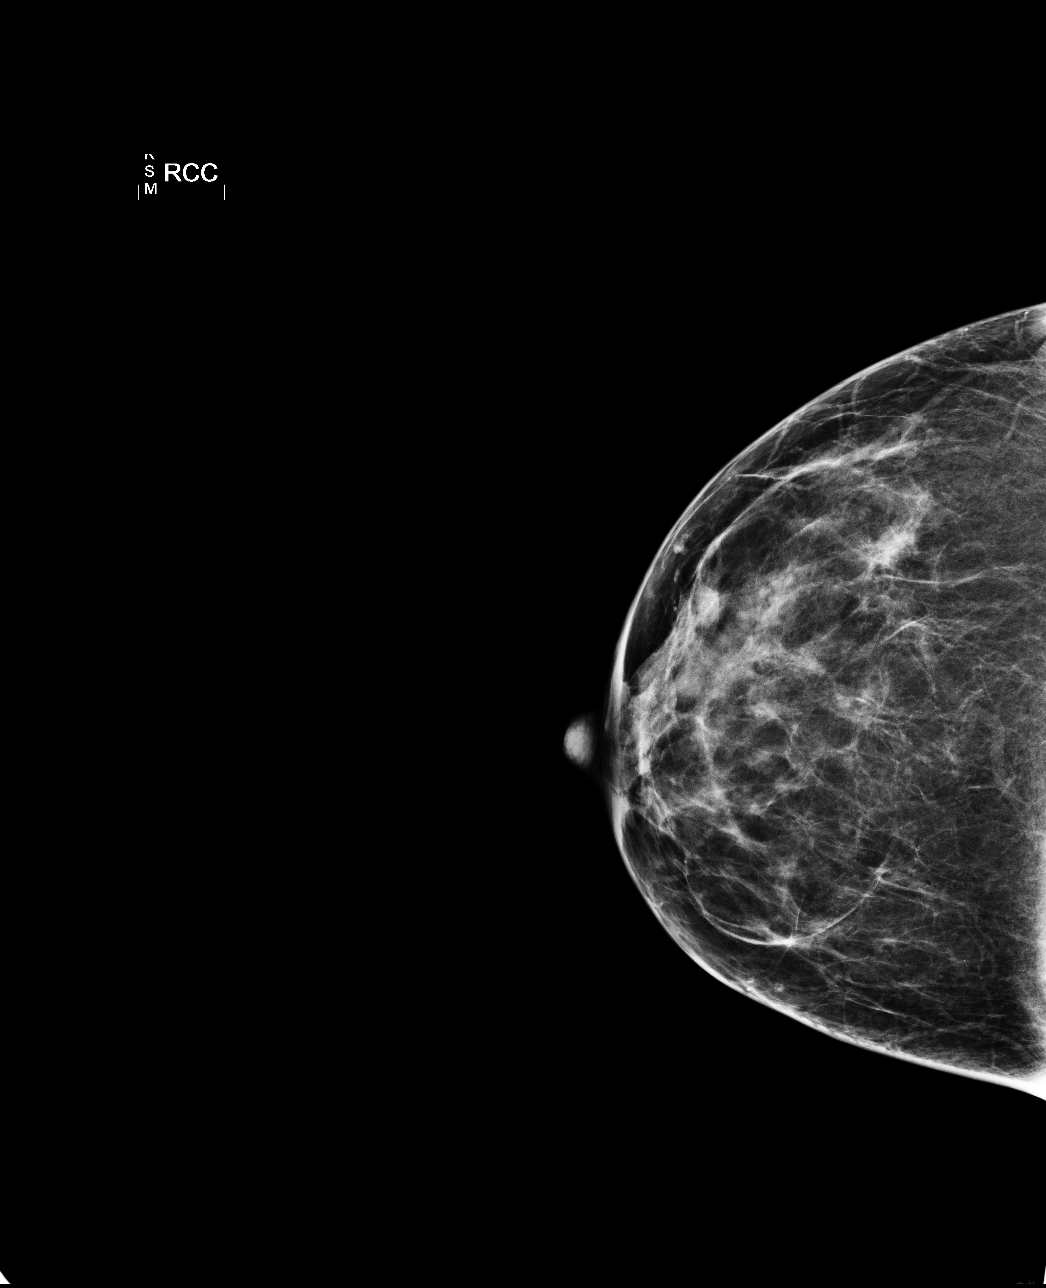

[L CC]
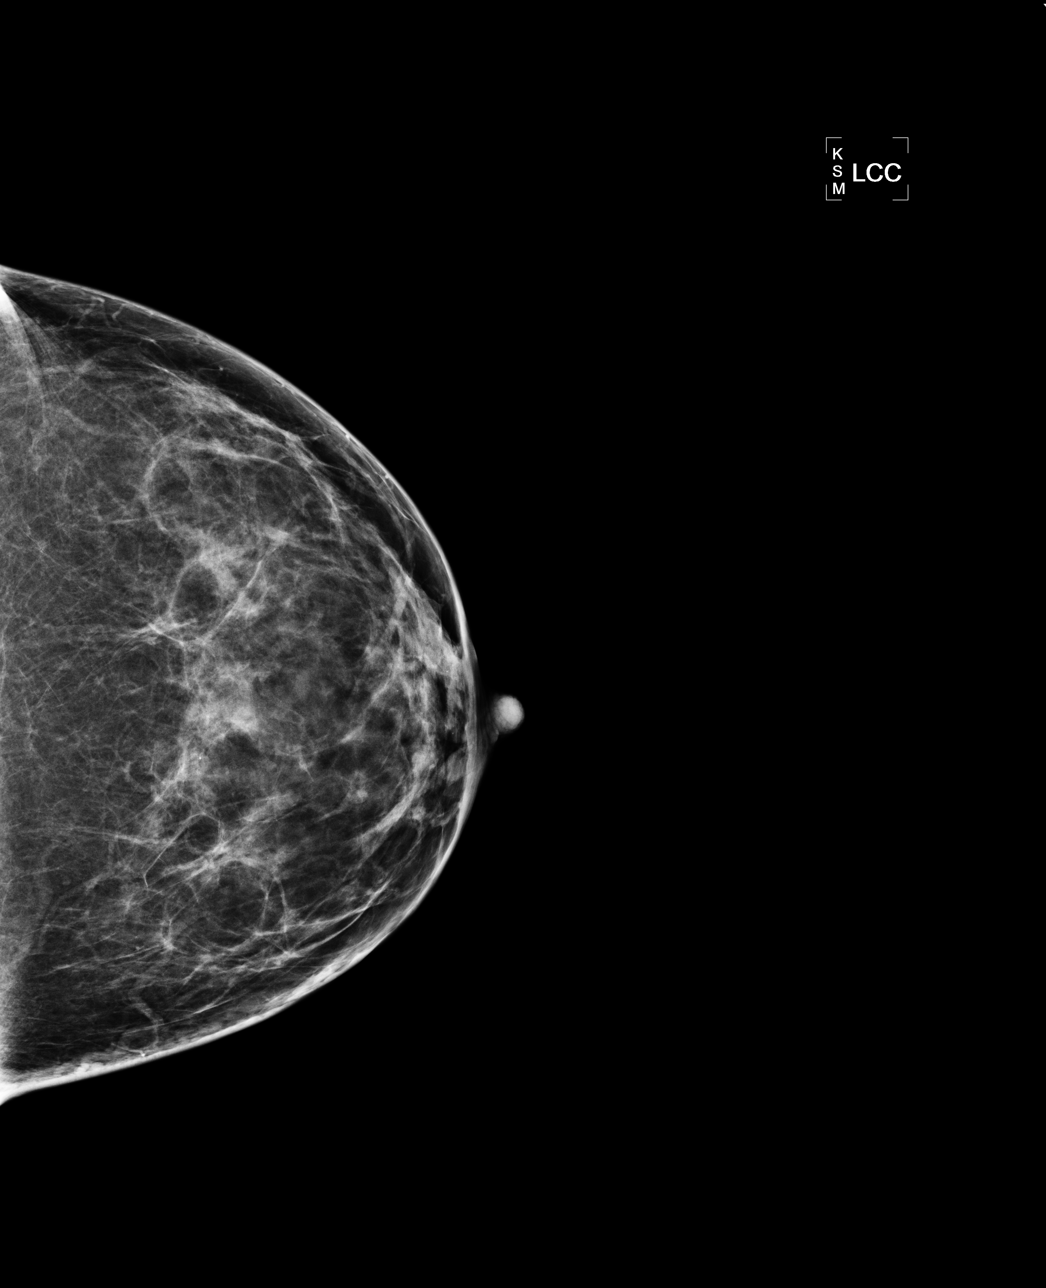

[L MLO]
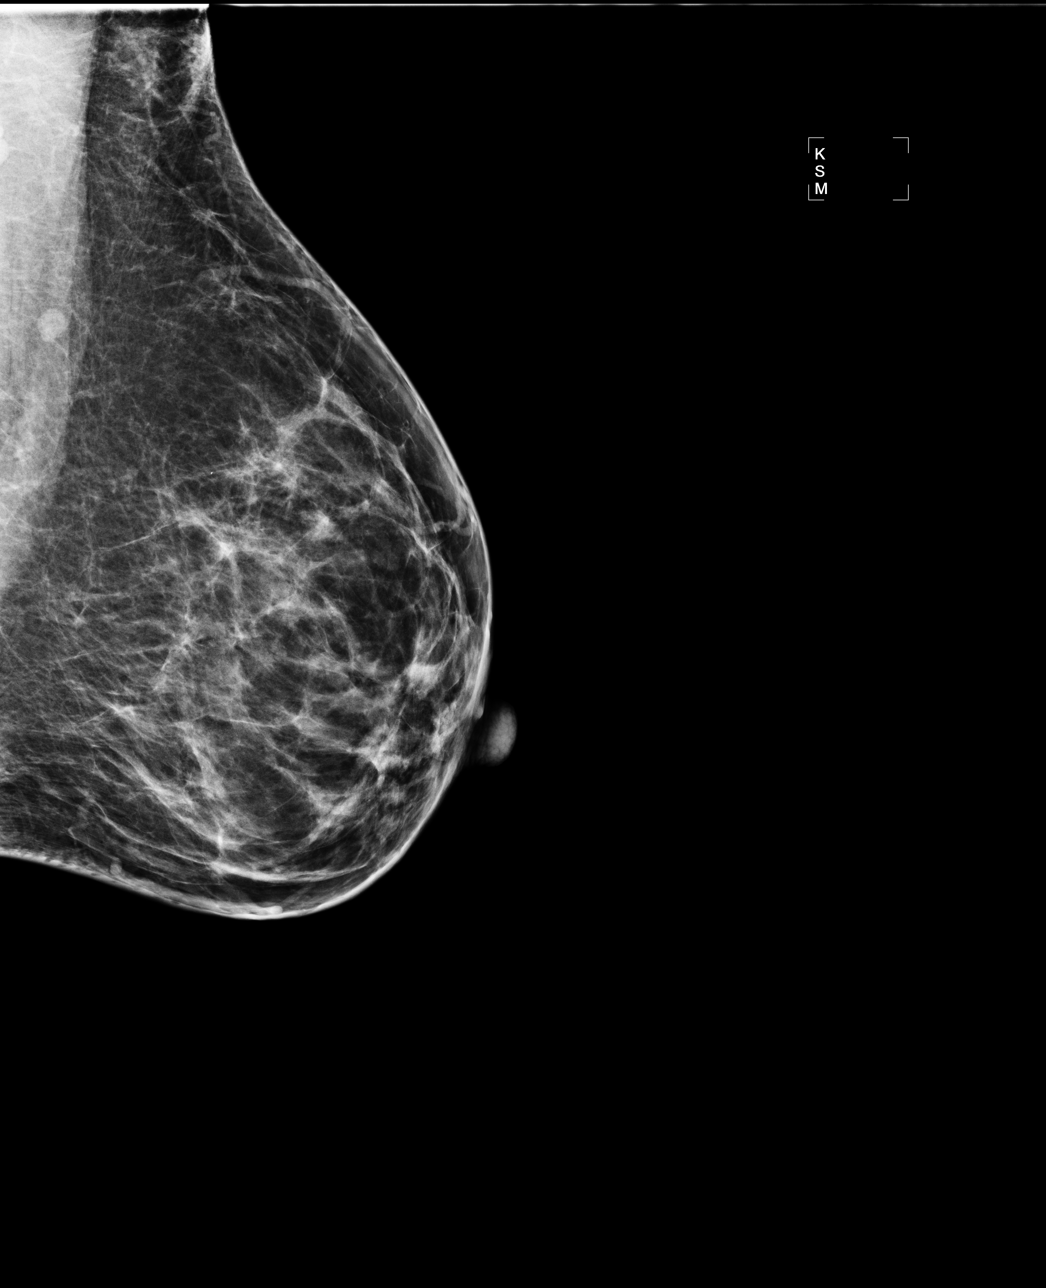

[R MLO]
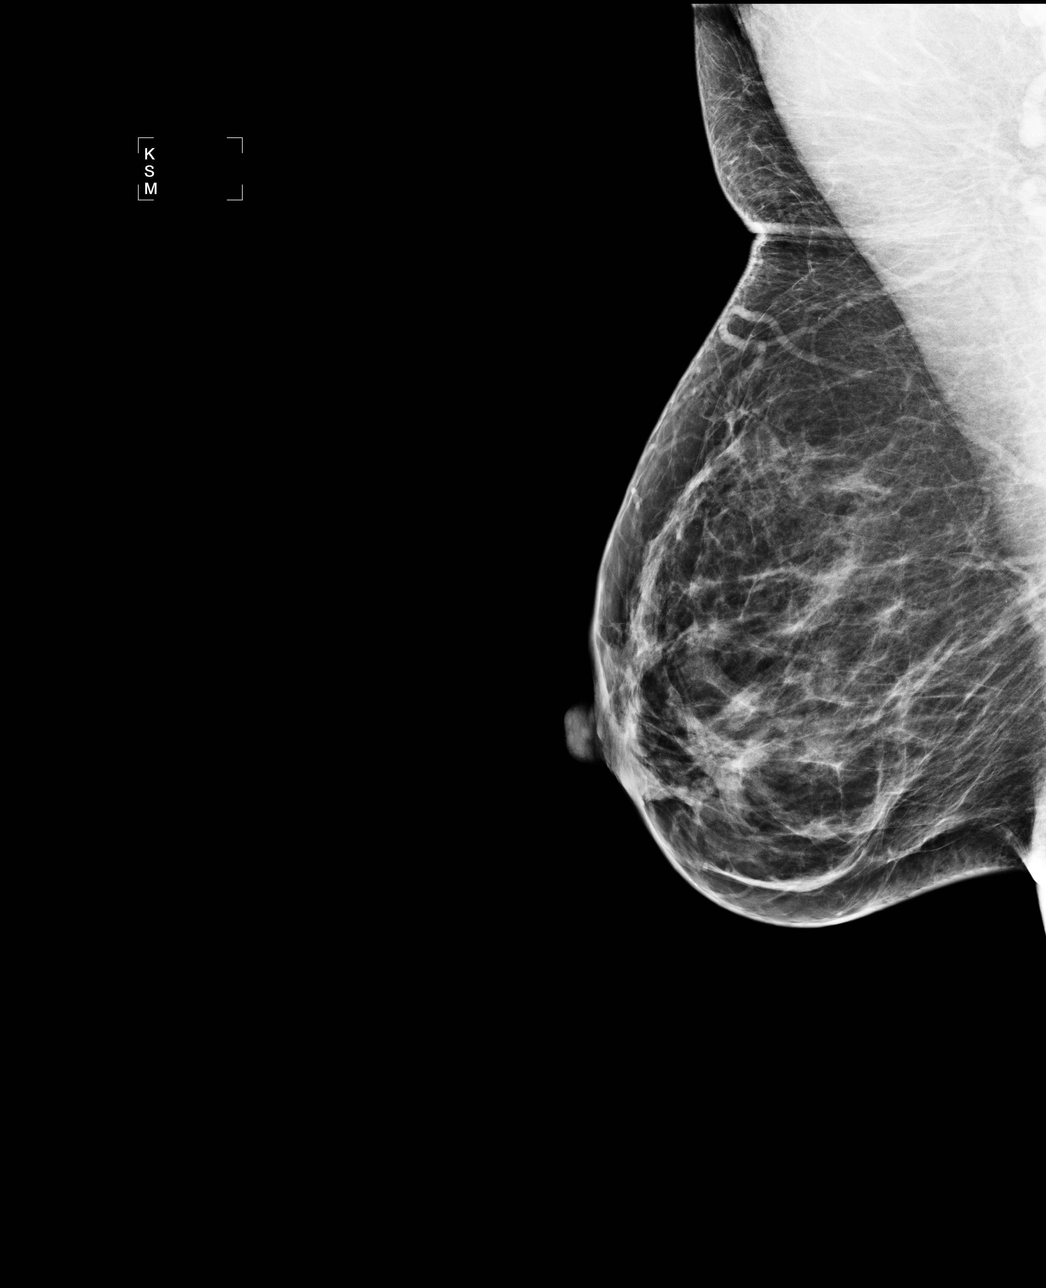

[4 of 4 positions shown; findings below may reference images not displayed]

IMPRESSION: No specific mammographic evidence of malignancy.  Next screening mammogram is recommended in one 
year.

A result letter of this screening mammogram will be mailed directly to the patient.

ASSESSMENT: Negative - BI-RADS 1

Screening mammogram in 1 year.
,

## 2011-03-18 ENCOUNTER — Ambulatory Visit (INDEPENDENT_AMBULATORY_CARE_PROVIDER_SITE_OTHER): Payer: BC Managed Care – PPO | Admitting: Physician Assistant

## 2011-03-18 ENCOUNTER — Encounter: Payer: Self-pay | Admitting: Physician Assistant

## 2011-03-18 ENCOUNTER — Ambulatory Visit (INDEPENDENT_AMBULATORY_CARE_PROVIDER_SITE_OTHER): Payer: BC Managed Care – PPO | Admitting: *Deleted

## 2011-03-18 DIAGNOSIS — Z950 Presence of cardiac pacemaker: Secondary | ICD-10-CM

## 2011-03-18 DIAGNOSIS — Q078 Other specified congenital malformations of nervous system: Secondary | ICD-10-CM

## 2011-03-18 DIAGNOSIS — Z01818 Encounter for other preprocedural examination: Secondary | ICD-10-CM

## 2011-03-18 DIAGNOSIS — I442 Atrioventricular block, complete: Secondary | ICD-10-CM

## 2011-03-18 NOTE — Patient Instructions (Signed)
Your physician recommends that you schedule a follow-up appointment in: 11/20 with Dr Graciela Husbands as scheduled

## 2011-03-18 NOTE — Assessment & Plan Note (Signed)
Yearly pacer check was performed today and she is stable.

## 2011-03-18 NOTE — Progress Notes (Signed)
HPI: This is a 40 year old female patient with history of SVT status post ablation. This was competent i.e. AV node dysfunction and pacemaker placement. She's also had a history of chest pain with typical and atypical. She underwent cardiac catheterization in February 2010 which revealed normal coronary arteries and LV function.  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 Overall she's doing well with Potts. She denies any frank syncope and keeps herself well hydrated.  She is here today for preop surgical clearance before undergoing left ulnar nerve surgery at the current medical clinic next week. She denies any cardiac complaints. She is scheduled for pacemaker check November 20.  No Known Allergies  Current Outpatient Prescriptions on File Prior to Visit  Medication Sig Dispense Refill  . buPROPion (WELLBUTRIN XL) 300 MG 24 hr tablet TAKE 1 TABLET EVERY DAY  30 tablet  7  . clonazePAM (KLONOPIN) 0.5 MG tablet Take 0.5 mg by mouth 2 (two) times daily as needed. Take one tablet in the morning and one half in the evening       . ibuprofen (ADVIL,MOTRIN) 600 MG tablet Take 600 mg by mouth daily.       . metoprolol succinate (TOPROL XL) 25 MG 24 hr tablet Take 1 tablet (25 mg total) by mouth daily.  30 tablet  11    Past Medical History  Diagnosis Date  . SVT (supraventricular tachycardia)     s/p ablation a. c/b AV nod ablation requiring pacemaker  . Chest pain     a cath 2/10. EF 50-55% normal coronaries  . Depression   . Dyspnea     CT negative for PE, 2007  . OCD (obsessive compulsive disorder)     Past Surgical History  Procedure Date  . Breast lumpectomy   . Laparoscopic tubal sterilization     No family history on file.  History   Social History  . Marital Status: Married    Spouse Name: N/A    Number of Children: N/A  . Years of  Education: N/A   Occupational History  . Not on file.   Social History Main Topics  . Smoking status: Never Smoker   . Smokeless tobacco: Not on file  . Alcohol Use: Not on file  . Drug Use: Not on file  . Sexually Active: Not on file   Other Topics Concern  . Not on file   Social History Narrative  . No narrative on file    ROS: See HPI Eyes: Negative Ears:Negative for hearing loss, tinnitus Cardiovascular: Negative for chest pain,irregular heartbeat, dyspnea, dyspnea on exertion,  orthopnea, paroxysmal nocturnal dyspnia and syncope,edema, claudication, cyanosis,.  Respiratory:   Negative for cough, hemoptysis, shortness of breath, sleep disturbances due to breathing, sputum production and wheezing.   Endocrine: Negative for cold intolerance and heat intolerance.  Hematologic/Lymphatic: Negative for adenopathy and bleeding problem. Does not bruise/bleed easily.  Musculoskeletal: left arm muscle weakness due to ulnar nerve compression   Gastrointestinal: positive for constipation,Negative for nausea, vomiting, reflux, abdominal pain, diarrhea.   Neurological: Potts.  Allergic/Immunologic: Negative for environmental allergies.   PHYSICAL EXAM: Well-nournished, in no acute distress. Neck: No JVD, HJR, Bruit, or thyroid enlargement Lungs: No tachypnea, clear without wheezing, rales, or rhonchi Cardiovascular: RRR, PMI not displaced, heart sounds normal, no murmurs, gallops, bruit, thrill, or heave. Abdomen: BS  normal. Soft without organomegaly, masses, lesions or tenderness. Extremities: without cyanosis, clubbing or edema. Good distal pulses bilateral SKin: Warm, no lesions or rashes  Musculoskeletal: No deformities Neuro: no focal signs  BP 110/64  Pulse 70  Ht 5\' 4"  (1.626 m)  Wt 149 lb 6.4 oz (67.767 kg)  BMI 25.64 kg/m2  LMP 02/17/2011  YNW:[id number] pacing,normal sinus rhythm

## 2011-03-18 NOTE — Assessment & Plan Note (Signed)
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

## 2011-03-18 NOTE — Assessment & Plan Note (Signed)
Patient is here for preop surgical clearance before undergoing ulnar nerve compression surgery by Dr. Erin Sons next week. She has no cardiac complaints. She underwent ablation for SVT that was complicated by AV nodal dysfunction and underwent pacemaker placement. Pacer check was done today and she is stable. I discussed the patient with Dr.Brackbill  who agrees she can proceed with surgery.

## 2011-03-19 ENCOUNTER — Encounter: Payer: Self-pay | Admitting: Internal Medicine

## 2011-03-19 LAB — PACEMAKER DEVICE OBSERVATION
BATTERY VOLTAGE: 2.96 V
VENTRICULAR PACING PM: 100

## 2011-03-19 NOTE — Progress Notes (Signed)
Pacer interrogation only   

## 2011-03-20 LAB — BASIC METABOLIC PANEL
BUN: 12
Calcium: 9
Chloride: 104
Creatinine, Ser: 0.78
GFR calc non Af Amer: >60
Glucose, Bld: 74
Potassium: 3.8
Sodium: 138

## 2011-03-20 LAB — CBC
HCT: 42.4
Hemoglobin: 14.3
RDW: 11.9

## 2011-03-26 ENCOUNTER — Ambulatory Visit: Payer: Self-pay | Admitting: Unknown Physician Specialty

## 2011-04-01 ENCOUNTER — Ambulatory Visit: Payer: Self-pay | Admitting: Unknown Physician Specialty

## 2011-04-23 ENCOUNTER — Encounter: Payer: Self-pay | Admitting: Internal Medicine

## 2011-04-23 ENCOUNTER — Ambulatory Visit (INDEPENDENT_AMBULATORY_CARE_PROVIDER_SITE_OTHER): Payer: BC Managed Care – PPO | Admitting: Internal Medicine

## 2011-04-23 ENCOUNTER — Other Ambulatory Visit: Payer: Self-pay | Admitting: Internal Medicine

## 2011-04-23 VITALS — BP 118/80 | HR 90 | Ht 64.0 in | Wt 152.0 lb

## 2011-04-23 DIAGNOSIS — Q078 Other specified congenital malformations of nervous system: Secondary | ICD-10-CM

## 2011-04-23 DIAGNOSIS — Z950 Presence of cardiac pacemaker: Secondary | ICD-10-CM

## 2011-04-23 DIAGNOSIS — R030 Elevated blood-pressure reading, without diagnosis of hypertension: Secondary | ICD-10-CM

## 2011-04-23 DIAGNOSIS — I442 Atrioventricular block, complete: Secondary | ICD-10-CM

## 2011-04-23 LAB — PACEMAKER DEVICE OBSERVATION
AL IMPEDENCE PM: 384 Ohm
ATRIAL PACING PM: 3.89
BAMS-0001: 180 {beats}/min
RV LEAD IMPEDENCE PM: 480 Ohm
RV LEAD THRESHOLD: 1 V
VENTRICULAR PACING PM: 99.99

## 2011-04-23 NOTE — Assessment & Plan Note (Signed)
Seems to be queiscient with less stress

## 2011-04-23 NOTE — Assessment & Plan Note (Signed)
The patient's device was interrogated.  The information was reviewed. No changes were made in the programming.    

## 2011-04-23 NOTE — Assessment & Plan Note (Signed)
Stable post pacing 

## 2011-04-23 NOTE — Progress Notes (Signed)
  HPI  Angela [name] is a 40 y.o. female Seen in followup for [facility] as cx of RFCA AVNRT  She has been very stressed at school but has been muchbetter since she has not been working she has anticipating going been back to work in Jan  Past Medical History  Diagnosis Date  . SVT (supraventricular tachycardia)     s/p ablation a. c/b AV nod ablation requiring pacemaker  . Chest pain     a cath 2/10. EF 50-55% normal coronaries  . Depression   . Dyspnea     CT negative for PE, 2007  . OCD (obsessive compulsive disorder)     Past Surgical History  Procedure Date  . Breast lumpectomy   . Laparoscopic tubal sterilization     Current Outpatient Prescriptions  Medication Sig Dispense Refill  . AMITIZA 24 MCG capsule Take 24 mcg by mouth 2 (two) times daily with a meal.       . buPROPion (WELLBUTRIN XL) 300 MG 24 hr tablet TAKE 1 TABLET EVERY DAY  30 tablet  7  . HYDROcodone-acetaminophen (VICODIN) 5-500 MG per tablet Take 1 tablet by mouth every 6 (six) hours as needed.       Marland Kitchen ibuprofen (ADVIL,MOTRIN) 600 MG tablet Take 600 mg by mouth daily.       . metoprolol succinate (TOPROL XL) 25 MG 24 hr tablet Take 1 tablet (25 mg total) by mouth daily.  30 tablet  11  . sertraline (ZOLOFT) 25 MG tablet Take 25 mg by mouth daily. Take 1/2 tablet daily.       . clonazePAM (KLONOPIN) 0.5 MG tablet Take 0.5 mg by mouth 2 (two) times daily as needed. Take one tablet in the morning and one half in the evening         No Known Allergies  Review of Systems negative except from HPI and PMH  Physical Exam Well developed and well nourished in no acute distress HENT normal E scleral and icterus clear Neck Supple JVP flat; carotids brisk and full Clear to ausculation Regular rate and rhythm, no murmurs gallops or rub Soft with active bowel sounds No clubbing cyanosis and edema Alert and oriented, grossly normal motor and sensory function Skin Warm and Dry  ECG  Assessment and  Plan

## 2011-04-23 NOTE — Assessment & Plan Note (Signed)
Improved actually so will wean off toprol

## 2011-05-16 ENCOUNTER — Telehealth: Payer: Self-pay | Admitting: Internal Medicine

## 2011-05-16 NOTE — Telephone Encounter (Signed)
Patient called, stated she was shopping today felt dizzy,lightheaded.Stated blood pressure 96/68,pulse 76beats/min.Stated she had not ate lunch.Advised to to eat lunch.Patient was called back after she had ate lunch.States she feels much better.Advised to call back if needed.

## 2011-05-16 NOTE — Telephone Encounter (Signed)
New Msg: pt calling stating that she is not feeling well, feeling lightheaded and dizzy. Pt c/o having difficulty focusing. Pt BP is 92/67 and HR has jagged line through it on BP reading machine. Pt would like to be advised as to what to do. Please return pt call to discuss further. Pt doesn't know if she needs to send in a remote device check or what. Please call pt to discuss.

## 2011-07-25 ENCOUNTER — Encounter: Payer: Self-pay | Admitting: Internal Medicine

## 2011-07-25 ENCOUNTER — Ambulatory Visit (INDEPENDENT_AMBULATORY_CARE_PROVIDER_SITE_OTHER): Payer: BC Managed Care – PPO | Admitting: *Deleted

## 2011-07-25 DIAGNOSIS — Z950 Presence of cardiac pacemaker: Secondary | ICD-10-CM

## 2011-07-25 DIAGNOSIS — I442 Atrioventricular block, complete: Secondary | ICD-10-CM

## 2011-07-29 LAB — REMOTE PACEMAKER DEVICE
ATRIAL PACING PM: 1.27
BATTERY VOLTAGE: 2.96 V
BRDY-0003RA: 150 {beats}/min
RV LEAD IMPEDENCE PM: 488 Ohm
VENTRICULAR PACING PM: 99.98

## 2011-07-31 ENCOUNTER — Encounter: Payer: Self-pay | Admitting: *Deleted

## 2011-08-01 NOTE — Progress Notes (Signed)
Remote pacer check  

## 2011-08-16 ENCOUNTER — Encounter: Payer: Self-pay | Admitting: Internal Medicine

## 2011-08-20 ENCOUNTER — Encounter: Payer: BC Managed Care – PPO | Admitting: Gynecology

## 2011-09-09 ENCOUNTER — Encounter: Payer: BC Managed Care – PPO | Admitting: Gynecology

## 2011-10-24 ENCOUNTER — Encounter: Payer: Self-pay | Admitting: Internal Medicine

## 2011-10-24 ENCOUNTER — Ambulatory Visit (INDEPENDENT_AMBULATORY_CARE_PROVIDER_SITE_OTHER): Payer: BC Managed Care – PPO | Admitting: *Deleted

## 2011-10-24 DIAGNOSIS — I442 Atrioventricular block, complete: Secondary | ICD-10-CM

## 2011-10-24 DIAGNOSIS — Z950 Presence of cardiac pacemaker: Secondary | ICD-10-CM

## 2011-10-25 LAB — REMOTE PACEMAKER DEVICE
AL IMPEDENCE PM: 344 Ohm
ATRIAL PACING PM: 0.99
BAMS-0001: 180 {beats}/min
BRDY-0002RA: 60 {beats}/min
BRDY-0004RA: 150 {beats}/min
VENTRICULAR PACING PM: 99.94

## 2011-11-04 ENCOUNTER — Encounter: Payer: Self-pay | Admitting: *Deleted

## 2011-11-27 ENCOUNTER — Other Ambulatory Visit: Payer: Self-pay | Admitting: Gynecology

## 2011-11-28 ENCOUNTER — Encounter: Payer: Self-pay | Admitting: *Deleted

## 2011-12-03 ENCOUNTER — Other Ambulatory Visit: Payer: Self-pay | Admitting: Gynecology

## 2011-12-04 ENCOUNTER — Ambulatory Visit (HOSPITAL_COMMUNITY): Payer: BC Managed Care – PPO | Attending: Cardiology | Admitting: Radiology

## 2011-12-04 ENCOUNTER — Encounter: Payer: Self-pay | Admitting: Internal Medicine

## 2011-12-04 ENCOUNTER — Ambulatory Visit (INDEPENDENT_AMBULATORY_CARE_PROVIDER_SITE_OTHER): Payer: BC Managed Care – PPO | Admitting: Internal Medicine

## 2011-12-04 VITALS — BP 109/83 | HR 133 | Ht 64.5 in | Wt 156.0 lb

## 2011-12-04 DIAGNOSIS — Z950 Presence of cardiac pacemaker: Secondary | ICD-10-CM

## 2011-12-04 DIAGNOSIS — I059 Rheumatic mitral valve disease, unspecified: Secondary | ICD-10-CM

## 2011-12-04 DIAGNOSIS — I442 Atrioventricular block, complete: Secondary | ICD-10-CM

## 2011-12-04 DIAGNOSIS — G909 Disorder of the autonomic nervous system, unspecified: Secondary | ICD-10-CM | POA: Insufficient documentation

## 2011-12-04 DIAGNOSIS — R000 Tachycardia, unspecified: Secondary | ICD-10-CM | POA: Insufficient documentation

## 2011-12-04 DIAGNOSIS — Q078 Other specified congenital malformations of nervous system: Secondary | ICD-10-CM

## 2011-12-04 LAB — PACEMAKER DEVICE OBSERVATION
AL AMPLITUDE: 1.8 mv
AL IMPEDENCE PM: 360 Ohm
AL THRESHOLD: 1 V
BATTERY VOLTAGE: 2.95 V
RV LEAD AMPLITUDE: 3.1 mv
RV LEAD IMPEDENCE PM: 488 Ohm

## 2011-12-04 MED ORDER — METOPROLOL TARTRATE 25 MG PO TABS: ORAL_TABLET | ORAL | Status: DC

## 2011-12-04 MED ORDER — BISOPROLOL FUMARATE 5 MG PO TABS: ORAL_TABLET | ORAL | Status: DC

## 2011-12-04 NOTE — Assessment & Plan Note (Signed)
The patient's device was interrogated.  The information was reviewed. No changes were made in the programming.    

## 2011-12-04 NOTE — Progress Notes (Signed)
HPI  Angela [name] is a 41 y.o. 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 She tried to return to work in the fall with a recurrence of her symptoms which were characterized by lightheadedness, tachypalpitations, and presyncope. She would occasionally fall. There is also significant stress that accompanied the aforementioned return to work and symptoms.  She's had significant GI symptoms related to her dysautonomia. She is oscillating between constipation and diarrhea. She continues to have orthostatic intolerance  And significant tachycardia palpitations. She had been on short-term disability. There apparently wasn't clerical issue with her paperwork that prompted the termination of her disability. She is in the process of reapplying.  Past Medical History  Diagnosis Date  . SVT (supraventricular tachycardia)     s/p ablation a. c/b AV nod ablation requiring pacemaker  . Chest pain     a cath 2/10. EF 50-55% normal coronaries  . Depression   . Dyspnea     CT negative for PE, 2007  . OCD (obsessive compulsive disorder)     Past Surgical History  Procedure Date  . Laparoscopic tubal sterilization   . Pacemaker insertion   . Pelvic laparoscopy 1990  . Tubal ligation 2003  . Breast lumpectomy     RIGHT -BREAST   . Endometrial ablation     Current Outpatient Prescriptions  Medication Sig Dispense Refill  . AMITIZA 24 MCG capsule Take 24 mcg by mouth 2 (two) times daily with a meal.       . buPROPion (WELLBUTRIN XL) 300 MG 24 hr tablet TAKE 1 TABLET EVERY DAY  30 tablet  7  . clonazePAM (KLONOPIN) 0.5 MG tablet Take 1 mg by mouth 2 (two) times daily as needed. Take one tablet in the morning and one half in the evening      . cyclobenzaprine (FLEXERIL) 10 MG tablet Take 10 mg by mouth 3 (three)  times daily as needed.      . DULoxetine (CYMBALTA) 60 MG capsule Take 60 mg by mouth daily.      Marland Kitchen ibuprofen (ADVIL,MOTRIN) 600 MG tablet Take 600 mg by mouth daily.       . Linaclotide (LINZESS) 290 MCG CAPS Take 1 capsule by mouth daily.      . meloxicam (MOBIC) 7.5 MG tablet Take 7.5 mg by mouth daily.      . metoprolol succinate (TOPROL-XL) 25 MG 24 hr tablet Take 25 mg by mouth daily.      . Multiple Vitamins-Minerals (MULTIVITAMINS THER. W/MINERALS) TABS Take 1 tablet by mouth daily.      Marland Kitchen DISCONTD: metoprolol succinate (TOPROL XL) 25 MG 24 hr tablet Take 1 tablet (25 mg total) by mouth daily.  30 tablet  11    Allergies  Allergen Reactions  . Toprol Xl (Metoprolol Succinate)     Review of Systems negative except from HPI and PMH  Physical Exam BP 109/83  Pulse 133  Ht 5' 4.5" (1.638 m)  Wt 156 lb (70.761 kg)  BMI 26.36 kg/m2 Well developed and well nourished in no acute distress HENT normal E scleral and icterus clear Neck Supple JVP flat; carotids brisk and full Clear to ausculation . Regular rate and rhythm, no murmurs gallops or rub Soft with active bowel sounds No clubbing cyanosis none Edema Alert  and oriented, grossly normal motor and sensory function Skin Warm and Dry  Electrocardiogram demonstrated sinus rhythm at a rate of 103. There is P. synchronous pacing  Assessment and  Plan

## 2011-12-04 NOTE — Assessment & Plan Note (Signed)
She was apparently told that there is a problem on her mitral valve with her initial echo undertaken 5-10 years ago. We do not have a copy of the echo and there is no echo in the old chart dating back to a chart that [facility] hospital. We will repeat an echo

## 2011-12-04 NOTE — Progress Notes (Signed)
Echocardiogram performed.  

## 2011-12-04 NOTE — Patient Instructions (Addendum)
Your physician has recommended you make the following change in your medication: ** You have been given 2 different beta blockers to try in place of your metoprolol succinate. You may try these in any order. DO NOT take more than one at a time. 1) metoprolol tartrate 25 mg one tablet by mouth twice daily. 2) bisoprolol 5 mg 1/2 tablet by mouth once daily. 3) Stop metoprolol succinate.  Remote monitoring is used to monitor your Pacemaker of ICD from home. This monitoring reduces the number of office visits required to check your device to one time per year. It allows [place] to keep an eye on the functioning of your device to ensure it is working properly. You are scheduled for a device check from home on 03/09/12. You may send your transmission at any time that day. If you have a wireless device, the transmission will be sent automatically. After your physician reviews your transmission, you will receive a postcard with your next transmission date.  Your physician wants you to follow-up in: 1 year with Dr. Graciela Husbands. You will receive a reminder letter in the mail two months in advance. If you don't receive a letter, please call our office to schedule the follow-up appointment.  Your physician has requested that you have an echocardiogram. Echocardiography is a painless test that uses sound waves to create images of your heart. It provides your doctor with information about the size and shape of your heart and how well your heart's chambers and valves are working. This procedure takes approximately one hour. There are no restrictions for this procedure.

## 2011-12-04 NOTE — Assessment & Plan Note (Signed)
The patient continues to suffer from stabling dysautonomia i.e. POTS characterized by orthostatic tachycardia. She has been intolerant of multiple beta blockers in the past including Inderal, atenolol, and  Nadolol. Her volume status seems to be quite good over and is described as somewhat yellow. She continues to take her salt supplementation to try to obviate her symptoms.  Psychosocial stress continues to be significantly aggravating factor for dysautonomic symptoms as is common. We will continue to work on trying to identify a beta blocker which she can tolerate without creating significant fatigue. She continues on her psychotropic drugs for her OCD.  She is also having significant GI symptoms and we will need to try to identify a course of therapy that is more tolerable as the alternating between constipation and diarrhea particularly nocturnal diarrhea is particularly disruptive

## 2011-12-09 ENCOUNTER — Telehealth: Payer: Self-pay | Admitting: Internal Medicine

## 2011-12-09 NOTE — Telephone Encounter (Signed)
I spoke with the patient. 

## 2011-12-09 NOTE — Telephone Encounter (Signed)
Patient returning nurse call, she can be reached at [phone].

## 2012-01-06 ENCOUNTER — Other Ambulatory Visit: Payer: Self-pay | Admitting: Internal Medicine

## 2012-01-07 NOTE — Telephone Encounter (Signed)
Fax Received. Refill Completed. Angela [name] (R.M.A)   

## 2012-01-27 ENCOUNTER — Other Ambulatory Visit: Payer: Self-pay | Admitting: Gynecology

## 2012-01-27 DIAGNOSIS — Z1231 Encounter for screening mammogram for malignant neoplasm of breast: Secondary | ICD-10-CM

## 2012-01-28 ENCOUNTER — Encounter: Payer: Self-pay | Admitting: Gynecology

## 2012-01-28 ENCOUNTER — Ambulatory Visit (INDEPENDENT_AMBULATORY_CARE_PROVIDER_SITE_OTHER): Payer: BC Managed Care – PPO | Admitting: Gynecology

## 2012-01-28 VITALS — BP 110/68 | Ht 64.25 in | Wt 158.0 lb

## 2012-01-28 DIAGNOSIS — Z01419 Encounter for gynecological examination (general) (routine) without abnormal findings: Secondary | ICD-10-CM

## 2012-01-28 DIAGNOSIS — G901 Familial dysautonomia [Riley-Day]: Secondary | ICD-10-CM | POA: Insufficient documentation

## 2012-01-28 DIAGNOSIS — N951 Menopausal and female climacteric states: Secondary | ICD-10-CM

## 2012-01-28 NOTE — Patient Instructions (Signed)
Follow up for mammogram as scheduled. Office will contact you with hormone results.  Follow up in one year for annual exam.

## 2012-01-28 NOTE — Progress Notes (Signed)
Angela [name] 1971/04/20 [id number]        41 y.o.  G2P2 for annual exam.  Several issues noted below.  Past medical history,surgical history, medications, allergies, family history and social history were all reviewed and documented in the EPIC chart. ROS:  Was performed and pertinent positives and negatives are included in the history.  Exam: Sherrilyn Rist assistant Filed Vitals:   01/28/12 1157  BP: 110/68  Height: 5' 4.25" (1.632 m)  Weight: 158 lb (71.668 kg)   General appearance  Normal Skin grossly normal Head/Neck normal with no cervical or supraclavicular adenopathy thyroid normal Lungs  clear Cardiac RR, without RMG Abdominal  soft, nontender, without masses, organomegaly or hernia Breasts  examined lying and sitting without masses, retractions, discharge or axillary adenopathy. Pelvic  Ext/BUS/vagina  normal   Cervix  normal   Uterus  anteverted, normal size, shape and contour, midline and mobile nontender   Adnexa  Without masses or tenderness    Anus and perineum  normal   Rectovaginal  normal sphincter tone without palpated masses or tenderness.    Assessment/Plan:  Angela [name] for annual exam, status post BTL.   1. Menopausal symptoms. Hot flashes some sleep disturbances. Currently being treated by primary with Ambien.  History endometrial ablation. Doing well with light menses every 6 weeks.  Check baseline TSH FSH prolactin. Assuming normal then continue to monitor. Discussed difficult to separate from her dysautonomia which can give her similar symptoms. 2. Mammography. Patient has scheduled in September. Continue with annual mammography. SBE monthly reviewed. 3. Pap smear. Pap smear 2012 normal. No Pap smear done today. No history of abnormal Pap smears before with numerous normal reports in chart. Discussed current screening guidelines we'll plan every 3-5 year screening. 4. Health maintenance.  CBC glucose lipid profile normal last year. No lab work done  today. Does have numerous labs done through her other physicians.    Dara Lords MD, 12:45 PM 01/28/2012

## 2012-01-29 LAB — URINALYSIS W MICROSCOPIC + REFLEX CULTURE
Bilirubin Urine: NEGATIVE
Crystals: NONE SEEN
Glucose, UA: NEGATIVE mg/dL
Specific Gravity, Urine: 1.01 (ref 1.005–1.030)
Squamous Epithelial / LPF: NONE SEEN
Urobilinogen, UA: 0.2 mg/dL (ref 0.0–1.0)

## 2012-02-26 ENCOUNTER — Ambulatory Visit
Admission: RE | Admit: 2012-02-26 | Discharge: 2012-02-26 | Disposition: A | Discharge status: Home / Self Care | Payer: BC Managed Care – PPO | Source: Ambulatory Visit | Attending: Gynecology | Admitting: Gynecology

## 2012-02-26 DIAGNOSIS — Z1231 Encounter for screening mammogram for malignant neoplasm of breast: Secondary | ICD-10-CM

## 2012-02-26 IMAGING — MG MM DIGITAL SCREENING BILAT W/ CAD
8 series · 9 of 24 positions shown · non-contrast
Comparison: Previous exams.

CLINICAL DATA: Screening.

DIGITAL BILATERAL SCREENING MAMMOGRAM WITH CAD
 DIGITAL BREAST TOMOSYNTHESIS
Digital breast tomosynthesis images are acquired in two
projections.  These images are reviewed in combination with the
digital mammogram, confirming the findings below.

[L MLO]
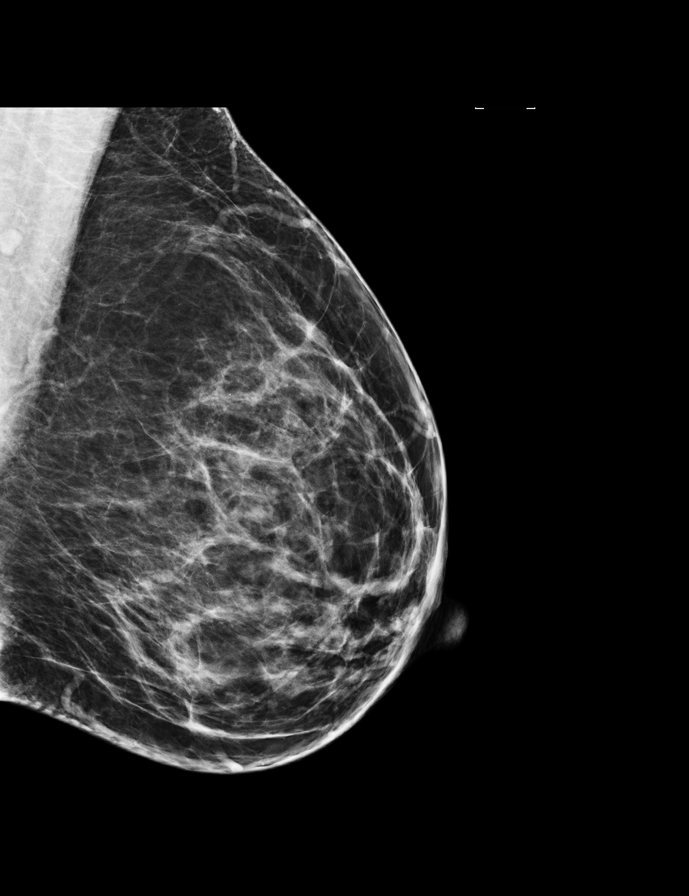

[L CC]
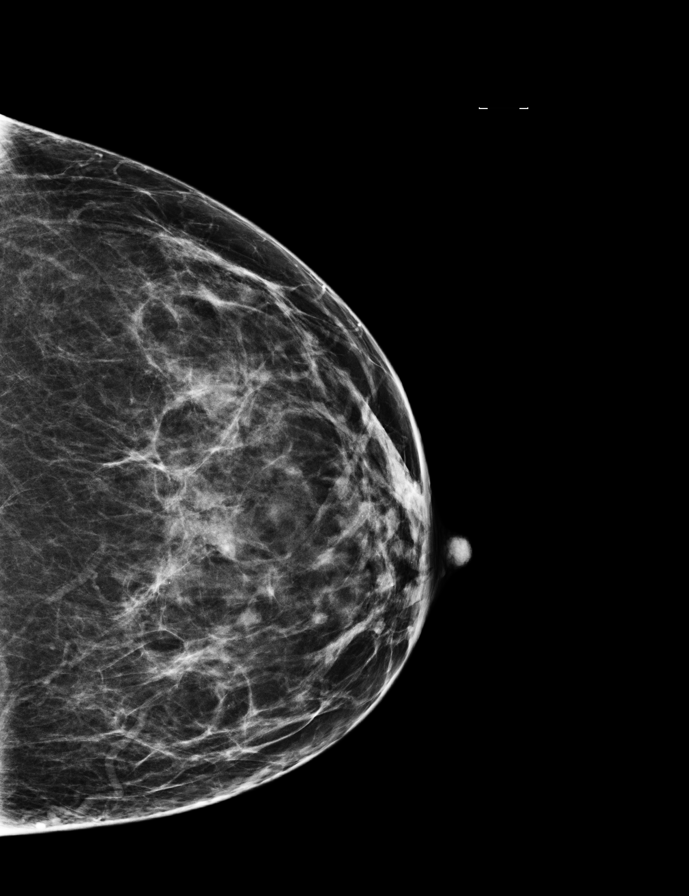

[R MLO]
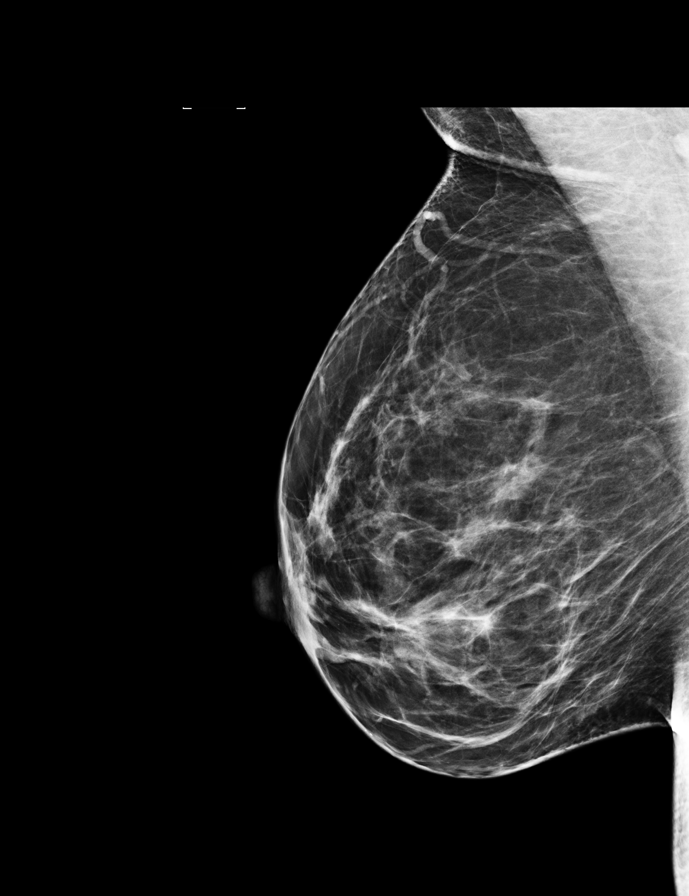

[R CC]
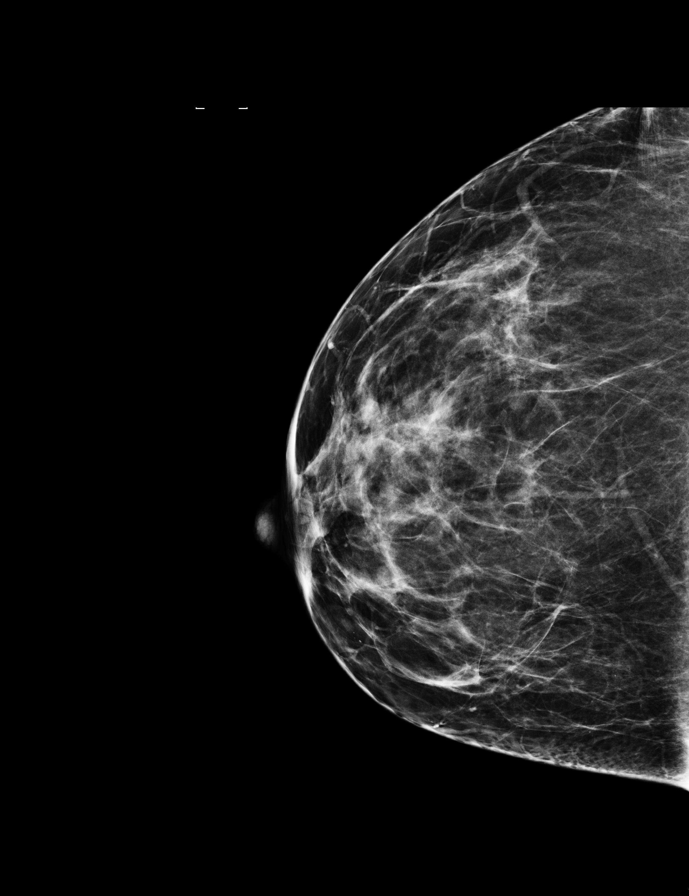

[RCC COMBO BREAST TOMOSYNTHESIS IMAGE tomo · 2 of 63 frames shown]
[frame 21/63]
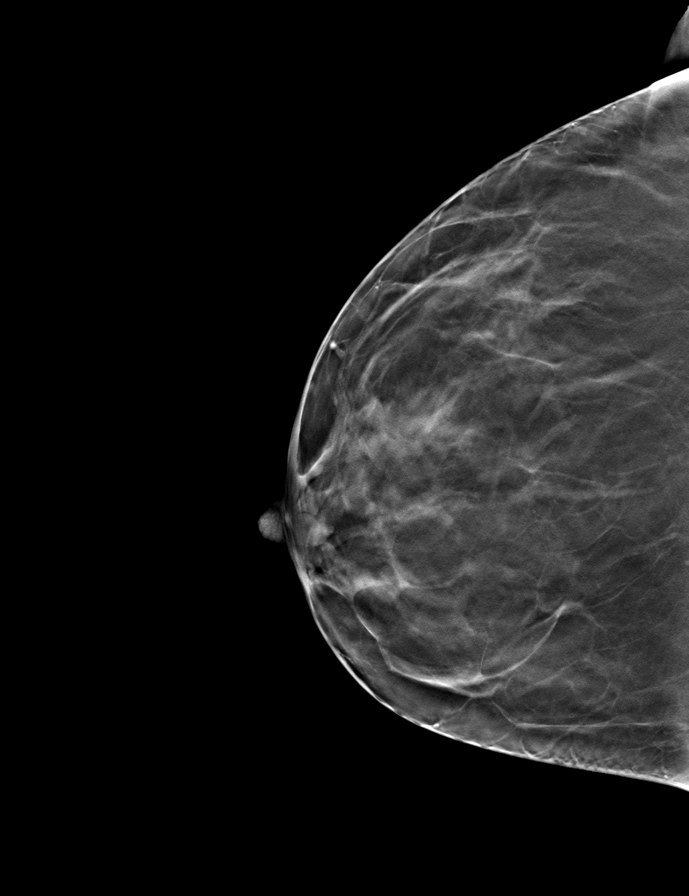
[frame 32/63]
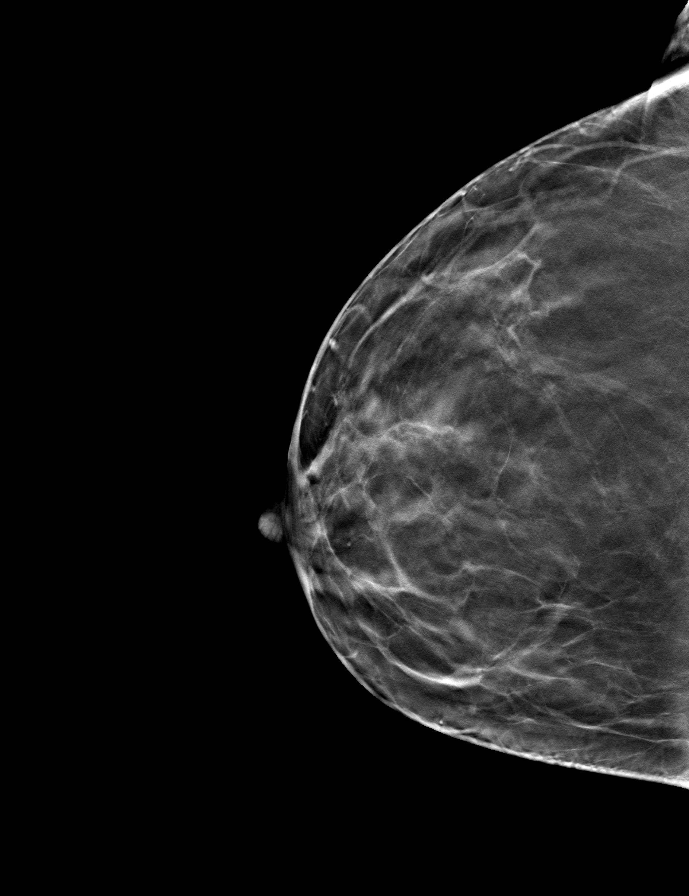

[RMLO COMBO BREAST TOMOSYNTHESIS IMAGE tomo · tomo slice 36/71.0]
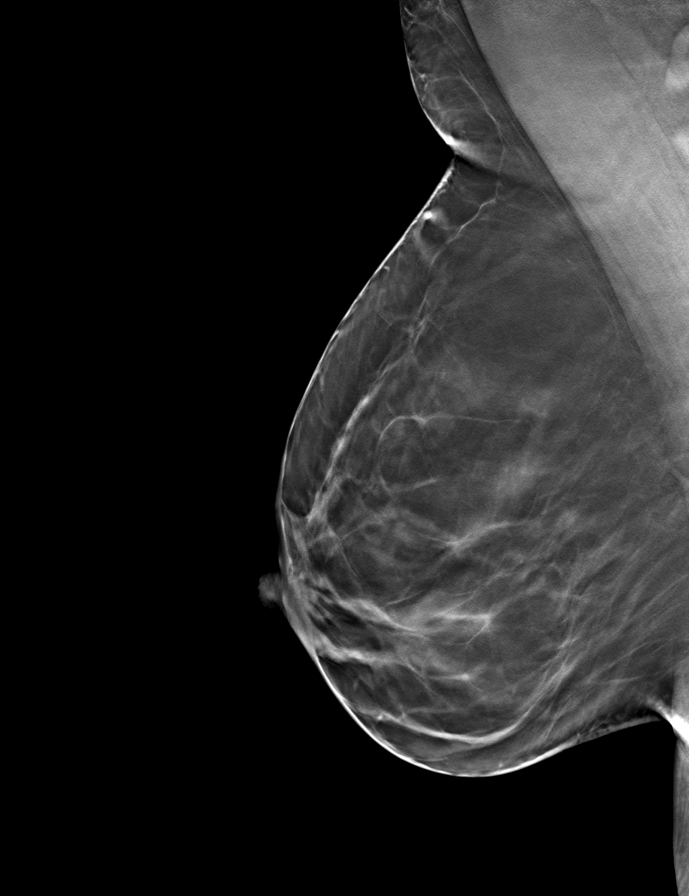

[LCC COMBO BREAST TOMOSYNTHESIS IMAGE tomo · tomo slice 31/61.0]
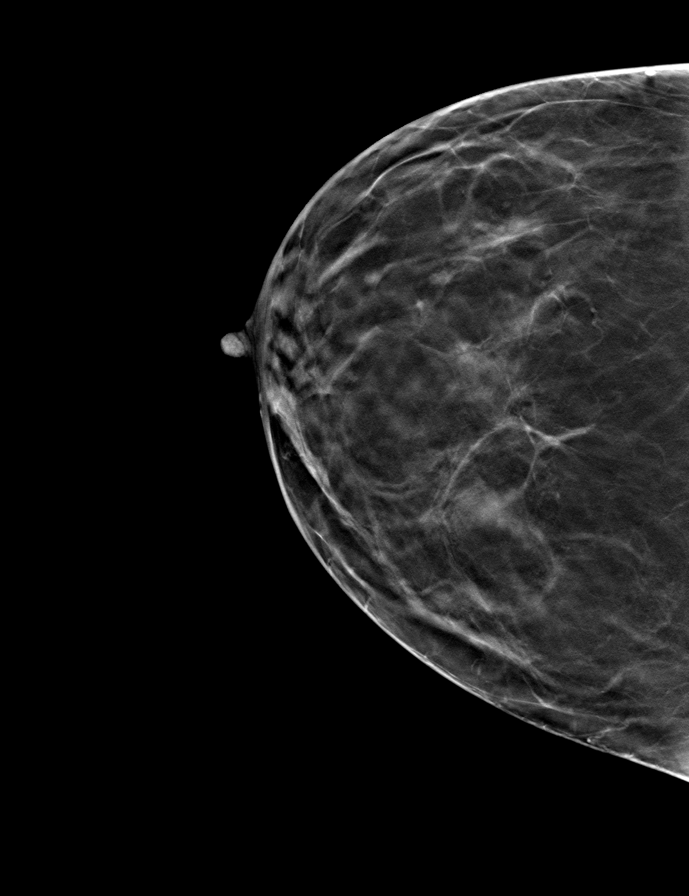

[LMLO COMBO BREAST TOMOSYNTHESIS IMAGE tomo · tomo slice 36/71.0]
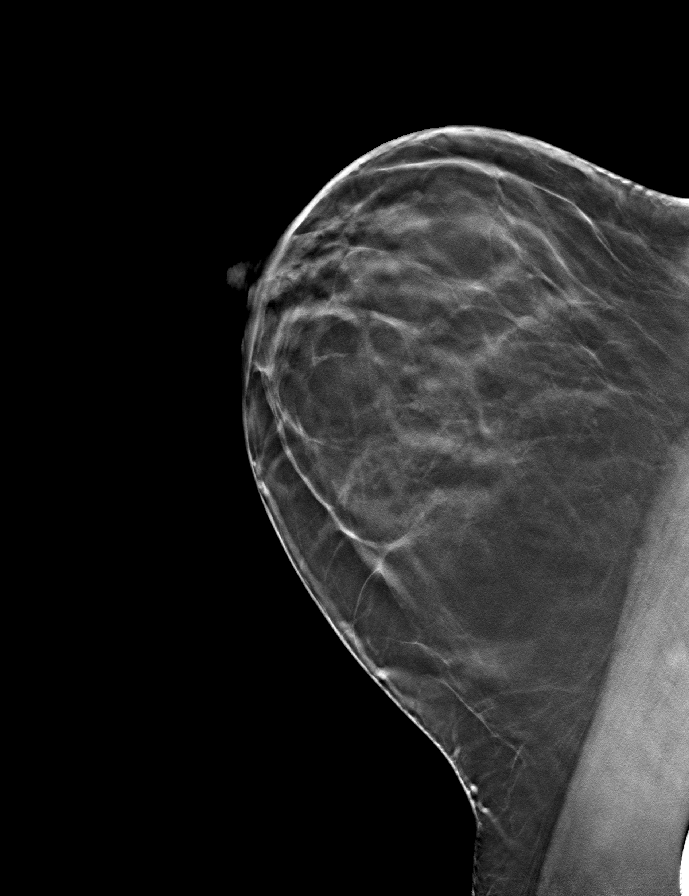

[9 of 24 positions shown; findings below may reference images not displayed]

FINDINGS: There are scattered fibroglandular densities. No
suspicious masses, architectural distortion, or calcifications are
present.

Images were processed with CAD.
IMPRESSION: No mammographic evidence of malignancy.

A result letter of this screening mammogram will be mailed directly
to the patient.

RECOMMENDATION:
Screening mammogram in one year. (Code:[H9])

BI-RADS CATEGORY 1:  Negative.

## 2012-03-06 LAB — REMOTE PACEMAKER DEVICE
AL IMPEDENCE PM: 360 Ohm
ATRIAL PACING PM: 3.69
BAMS-0001: 180 {beats}/min
BRDY-0002RA: 60 {beats}/min
BRDY-0003RA: 150 {beats}/min
BRDY-0004RA: 150 {beats}/min

## 2012-03-09 ENCOUNTER — Ambulatory Visit (INDEPENDENT_AMBULATORY_CARE_PROVIDER_SITE_OTHER): Payer: BC Managed Care – PPO | Admitting: *Deleted

## 2012-03-09 DIAGNOSIS — Z950 Presence of cardiac pacemaker: Secondary | ICD-10-CM

## 2012-03-09 DIAGNOSIS — I442 Atrioventricular block, complete: Secondary | ICD-10-CM

## 2012-03-17 ENCOUNTER — Encounter: Payer: Self-pay | Admitting: *Deleted

## 2012-03-30 ENCOUNTER — Encounter: Payer: Self-pay | Admitting: Internal Medicine

## 2012-04-03 ENCOUNTER — Encounter: Payer: Self-pay | Admitting: Internal Medicine

## 2012-04-03 ENCOUNTER — Ambulatory Visit: Payer: Self-pay | Admitting: Internal Medicine

## 2012-04-03 LAB — CBC WITH DIFFERENTIAL/PLATELET
Basophil #: 0.1 10*3/uL (ref 0.0–0.1)
Basophil %: 1 %
Eosinophil #: 0.1 10*3/uL (ref 0.0–0.7)
HCT: 43.2 % (ref 35.0–47.0)
Lymphocyte #: 2.3 10*3/uL (ref 1.0–3.6)
MCH: 32 pg (ref 26.0–34.0)
MCV: 93 fL (ref 80–100)
Monocyte #: 0.7 x10 3/mm (ref 0.2–0.9)
Platelet: 273 10*3/uL (ref 150–440)
RBC: 4.65 10*6/uL (ref 3.80–5.20)
RDW: 12.4 % (ref 11.5–14.5)
WBC: 7.8 10*3/uL (ref 3.6–11.0)

## 2012-04-03 LAB — COMPREHENSIVE METABOLIC PANEL
Alkaline Phosphatase: 89 U/L (ref 50–136)
Calcium, Total: 9.4 mg/dL (ref 8.5–10.1)
Chloride: 104 mmol/L (ref 98–107)
Co2: 26 mmol/L (ref 21–32)
EGFR (African American): >60
EGFR (Non-African Amer.): >60
SGPT (ALT): 29 U/L (ref 12–78)

## 2012-04-03 LAB — MAGNESIUM: Magnesium: 2.1 mg/dL

## 2012-04-03 IMAGING — CR DG CHEST 2V
1 series · 2 of 2 positions shown · non-contrast
Comparison: none

REASON FOR EXAM: pacemaker; history of recent posterior chest heaviness
COMMENTS:   LMP: Now

PROCEDURE:     MDR - MDR CHEST PA(OR AP) AND LATERAL  - [DATE] [DATE]
RESULT:     Comparison: None.

[Series 1: pa · 0.17mm/px · 2 of 2 slices shown]
[im 1/2]
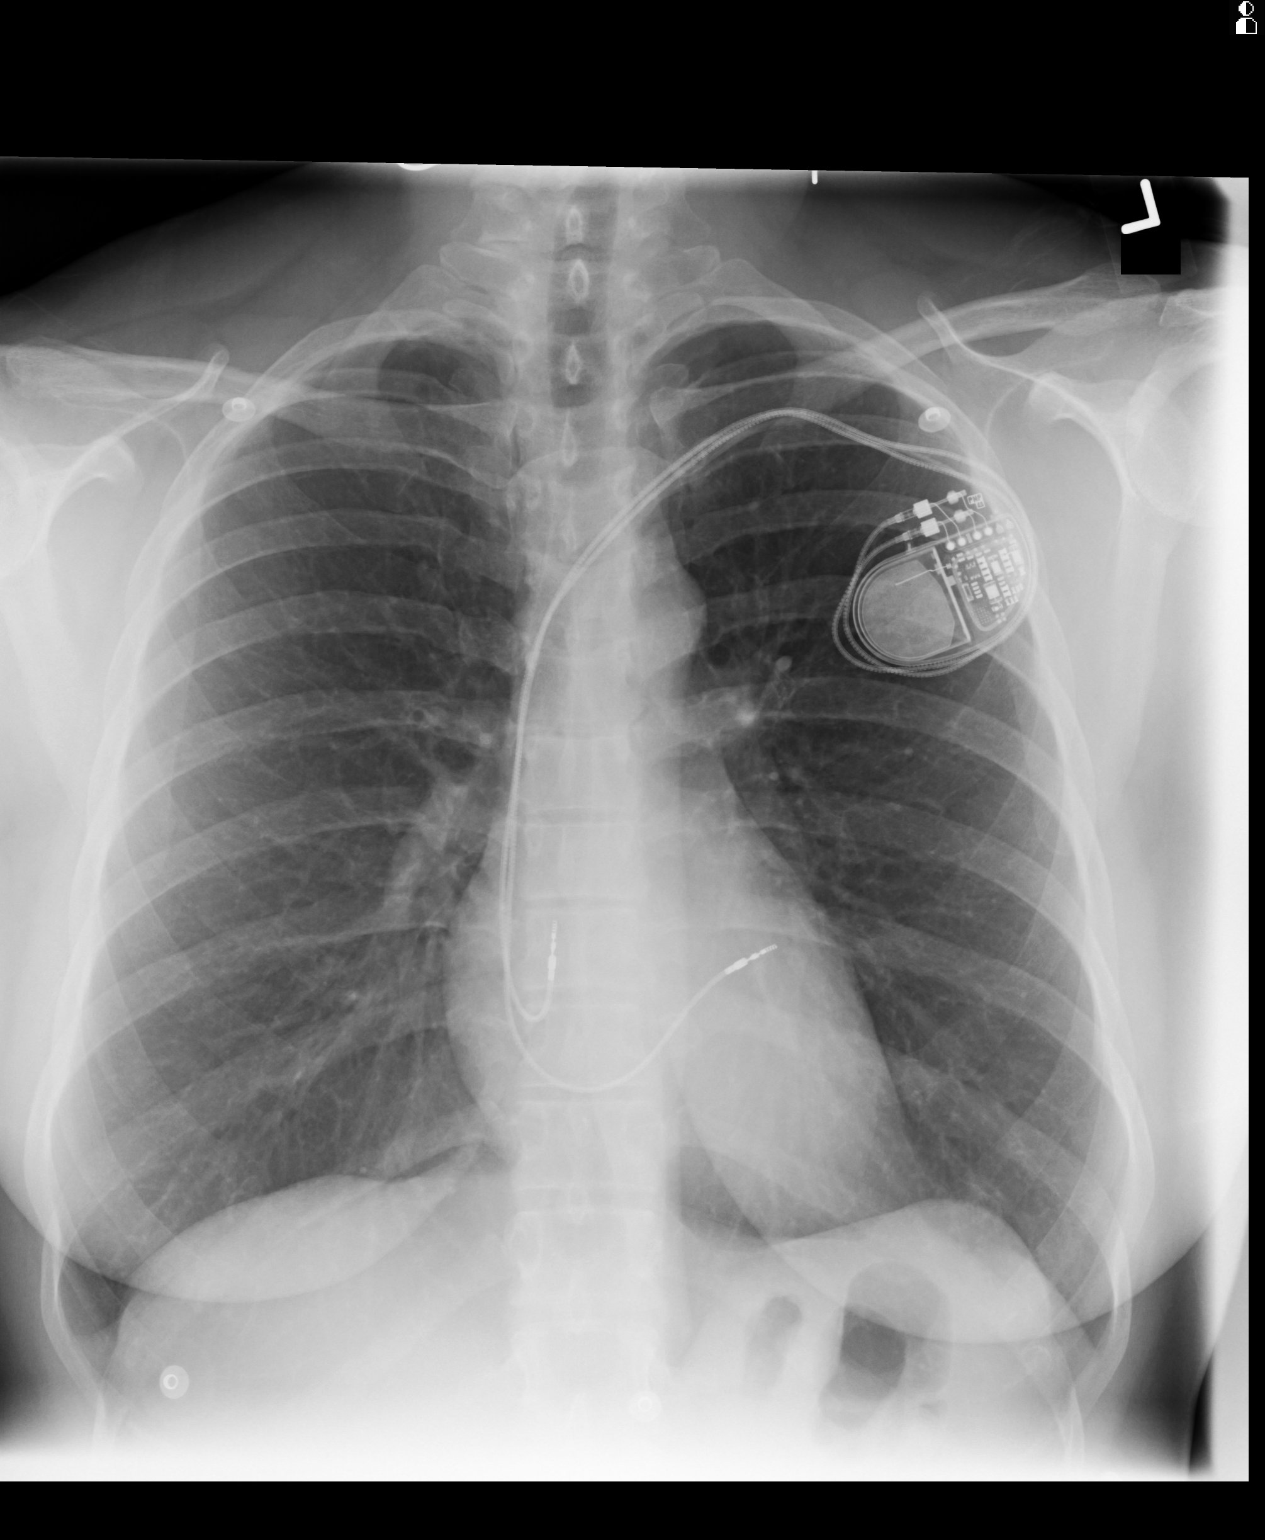
[im 2/2]
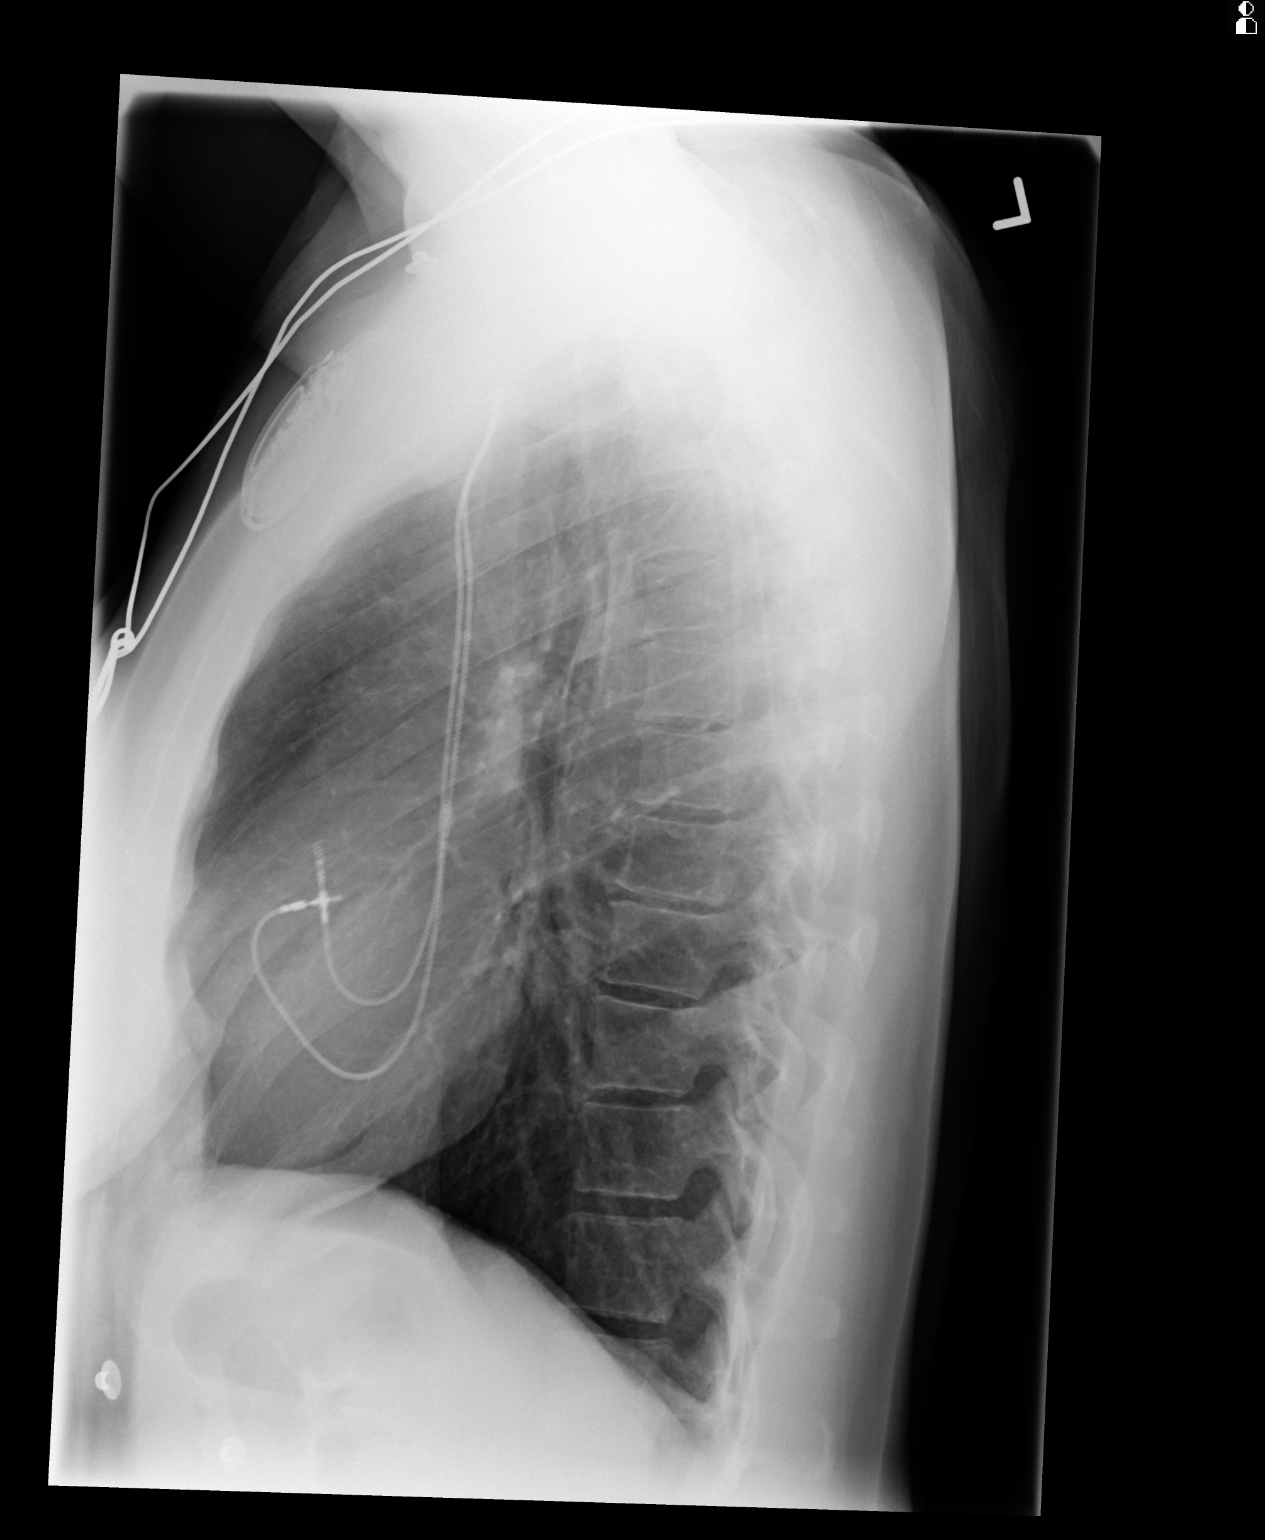

[2 of 2 positions shown; findings below may reference images not displayed]

FINDINGS: The heart and mediastinum are within normal limits. Wires are seen from dual
lead pacemaker. No focal pulmonary opacities.
IMPRESSION: No acute cardiopulmonary disease.

[REDACTED]

## 2012-06-05 ENCOUNTER — Telehealth: Payer: Self-pay | Admitting: Internal Medicine

## 2012-06-05 ENCOUNTER — Other Ambulatory Visit: Payer: Self-pay

## 2012-06-05 ENCOUNTER — Encounter (INDEPENDENT_AMBULATORY_CARE_PROVIDER_SITE_OTHER): Payer: BC Managed Care – PPO

## 2012-06-05 DIAGNOSIS — M79609 Pain in unspecified limb: Secondary | ICD-10-CM

## 2012-06-05 DIAGNOSIS — R229 Localized swelling, mass and lump, unspecified: Secondary | ICD-10-CM

## 2012-06-05 DIAGNOSIS — R52 Pain, unspecified: Secondary | ICD-10-CM

## 2012-06-05 NOTE — Telephone Encounter (Signed)
Transmission not received. Pt spoke with someone at [facility] because having trouble sending transmission. Will try and send transmission again.

## 2012-06-05 NOTE — Telephone Encounter (Signed)
Pt calling re doing transmission today instead of Monday, having some high heart rate and sob, winded, skipping beats, can she get results?

## 2012-06-08 ENCOUNTER — Encounter: Payer: BC Managed Care – PPO | Admitting: *Deleted

## 2012-06-08 NOTE — Telephone Encounter (Signed)
Pt seen in office in [place]/kwm

## 2012-06-17 ENCOUNTER — Encounter: Payer: Self-pay | Admitting: Internal Medicine

## 2012-06-17 ENCOUNTER — Ambulatory Visit (INDEPENDENT_AMBULATORY_CARE_PROVIDER_SITE_OTHER): Payer: BC Managed Care – PPO | Admitting: Internal Medicine

## 2012-06-17 VITALS — BP 143/94 | HR 87 | Ht 64.0 in | Wt 164.0 lb

## 2012-06-17 DIAGNOSIS — I442 Atrioventricular block, complete: Secondary | ICD-10-CM

## 2012-06-17 DIAGNOSIS — R000 Tachycardia, unspecified: Secondary | ICD-10-CM

## 2012-06-17 DIAGNOSIS — R030 Elevated blood-pressure reading, without diagnosis of hypertension: Secondary | ICD-10-CM

## 2012-06-17 DIAGNOSIS — Q078 Other specified congenital malformations of nervous system: Secondary | ICD-10-CM

## 2012-06-17 LAB — PACEMAKER DEVICE OBSERVATION
AL IMPEDENCE PM: 368 Ohm
ATRIAL PACING PM: 1.3
BATTERY VOLTAGE: 2.93 V
VENTRICULAR PACING PM: 99.8

## 2012-06-17 LAB — CBC WITH DIFFERENTIAL/PLATELET
Eosinophils Relative: 0.8 % (ref 0.0–5.0)
HCT: 39.8 % (ref 36.0–46.0)
Hemoglobin: 13.4 g/dL (ref 12.0–15.0)
Lymphocytes Relative: 37 % (ref 12.0–46.0)
Lymphs Abs: 1.8 10*3/uL (ref 0.7–4.0)
Monocytes Relative: 7.6 % (ref 3.0–12.0)
Neutro Abs: 2.6 10*3/uL (ref 1.4–7.7)
RBC: 4.33 Mil/uL (ref 3.87–5.11)
WBC: 4.9 10*3/uL (ref 4.5–10.5)

## 2012-06-17 LAB — TSH: TSH: 0.79 u[IU]/mL (ref 0.35–5.50)

## 2012-06-17 NOTE — Assessment & Plan Note (Signed)
There has been some options given her tachycardia so that about 15% of her beats now or faster than 100. This is different from prior to last month. It is not unreasonable for this young lady to have heart rates like that. We will check a CBC and a TSH just to look at possible underlying causes. The fact that her heart rates were going fast at rest suggested there may be some relationship tof her new medication doses.

## 2012-06-17 NOTE — Progress Notes (Signed)
Patient Care Team: Vonita Moss, MD as PCP - General (Unknown Physician Specialty)   HPI  Angela [name] is a 42 y.o. 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 She tried to return to work in the fall with a recurrence of her symptoms which were characterized by lightheadedness, tachypalpitations, and presyncope. She would occasionally fall. There is also significant stress that accompanied the aforementioned return to work and symptoms.  She's had significant GI symptoms related to her dysautonomia. She is oscillating between constipation and diarrhea. She continues to have orthostatic intolerance  And significant tachycardia palpitations.   she has recent onset of faster HR  With some stressors. Prior bb were tolerated but became ineffective   She has been treated with a variety of psychotropic agents which have resulted in some bizarre nocturnal behaviors prompting the discontinuation of Ambien  Past Medical History  Diagnosis Date  . SVT (supraventricular tachycardia)     s/p ablation a. c/b AV nod ablation requiring pacemaker  . Chest pain     a cath 2/10. EF 50-55% normal coronaries  . Depression   . Dyspnea     CT negative for PE, 2007  . OCD (obsessive compulsive disorder)   . Hypertension   . Insomnia   . Dysautonomia     Past Surgical History  Procedure Date  . Pacemaker insertion   . Pelvic laparoscopy 1990  . Tubal ligation 2003  . Breast lumpectomy     RIGHT -BREAST   . Endometrial ablation 2005  . Spark 2005    Current Outpatient Prescriptions  Medication Sig Dispense Refill  . clonazePAM (KLONOPIN) 0.5 MG tablet Take 1 mg by mouth 2 (two) times daily as needed. Take one tablet in the morning and one half in the evening      . cyclobenzaprine (FLEXERIL) 10 MG tablet Take 10 mg by  mouth 3 (three) times daily as needed.      . DULoxetine (CYMBALTA) 60 MG capsule Take 30 mg in the AM and 60 mg in the PM      . Linaclotide (LINZESS) 290 MCG CAPS Take 1 capsule by mouth daily.      . meloxicam (MOBIC) 7.5 MG tablet Take 7.5 mg by mouth daily.      . metoprolol tartrate (LOPRESSOR) 25 MG tablet TAKE ONE TABLET BY MOUTH TWICE DAILY.  60 tablet  5  . Multiple Vitamins-Minerals (MULTIVITAMINS THER. W/MINERALS) TABS Take 1 tablet by mouth daily.      Marland Kitchen zolpidem (AMBIEN) 5 MG tablet Take 5 mg by mouth at bedtime as needed.        Allergies  Allergen Reactions  . Toprol Xl (Metoprolol Succinate)     Review of Systems negative except from HPI and PMH  Physical Exam BP 143/94  Pulse 87  Ht 5\' 4"  (1.626 m)  Wt 164 lb (74.39 kg)  BMI 28.15 kg/m2  SpO2 97% Well developed and well nourished in no acute distress HENT normal E scleral and icterus clear Neck Supple JVP flat; carotids brisk and full Clear to ausculation Device pocket well healed; without hematoma or erythema Regular rate and rhythm, no murmurs gallops or rub Soft with active bowel sounds No clubbing cyanosis none Edema Alert and oriented, grossly normal motor and sensory function Skin Warm and Dry  Assessment and  Plan

## 2012-06-17 NOTE — Assessment & Plan Note (Signed)
BP variable high and low  Will not adjust too much but have her take extra lopressor in am prn

## 2012-06-17 NOTE — Assessment & Plan Note (Signed)
stable °

## 2012-06-17 NOTE — Patient Instructions (Addendum)
Your physician recommends that you have lab work today: cbc/tsh  Remote monitoring is used to monitor your Pacemaker of ICD from home. This monitoring reduces the number of office visits required to check your device to one time per year. It allows [place] to keep an eye on the functioning of your device to ensure it is working properly. You are scheduled for a device check from home on 09/14/12. You may send your transmission at any time that day. If you have a wireless device, the transmission will be sent automatically. After your physician reviews your transmission, you will receive a postcard with your next transmission date.  Your physician wants you to follow-up in: 1 year with Dr. Graciela Husbands. You will receive a reminder letter in the mail two months in advance. If you don't receive a letter, please call our office to schedule the follow-up appointment.

## 2012-06-17 NOTE — Assessment & Plan Note (Signed)
As above Continue betablockers

## 2012-06-22 ENCOUNTER — Encounter: Payer: BC Managed Care – PPO | Admitting: Internal Medicine

## 2012-06-22 ENCOUNTER — Encounter: Payer: Self-pay | Admitting: Internal Medicine

## 2012-07-18 ENCOUNTER — Other Ambulatory Visit: Payer: Self-pay

## 2012-09-14 ENCOUNTER — Other Ambulatory Visit: Payer: Self-pay

## 2012-09-14 ENCOUNTER — Ambulatory Visit (INDEPENDENT_AMBULATORY_CARE_PROVIDER_SITE_OTHER): Payer: BC Managed Care – PPO | Admitting: *Deleted

## 2012-09-14 DIAGNOSIS — I442 Atrioventricular block, complete: Secondary | ICD-10-CM

## 2012-09-14 DIAGNOSIS — Z950 Presence of cardiac pacemaker: Secondary | ICD-10-CM

## 2012-09-15 ENCOUNTER — Other Ambulatory Visit: Payer: Self-pay | Admitting: Internal Medicine

## 2012-09-18 ENCOUNTER — Encounter: Payer: Self-pay | Admitting: Gynecology

## 2012-09-18 ENCOUNTER — Telehealth: Payer: Self-pay | Admitting: Internal Medicine

## 2012-09-18 NOTE — Telephone Encounter (Signed)
New Prob   Pt wants to know if her remote pacer check was received from 4/14.

## 2012-09-18 NOTE — Telephone Encounter (Signed)
Transmission received, patient aware. 

## 2012-09-23 ENCOUNTER — Encounter: Payer: Self-pay | Admitting: *Deleted

## 2012-09-28 LAB — REMOTE PACEMAKER DEVICE
AL AMPLITUDE: 2.9 mv
BRDY-0002RA: 60 {beats}/min
BRDY-0003RA: 150 {beats}/min
BRDY-0004RA: 150 {beats}/min
RV LEAD IMPEDENCE PM: 496 Ohm

## 2012-10-22 ENCOUNTER — Encounter: Payer: Self-pay | Admitting: *Deleted

## 2012-11-04 ENCOUNTER — Encounter: Payer: Self-pay | Admitting: Internal Medicine

## 2012-12-18 ENCOUNTER — Encounter: Payer: Self-pay | Admitting: Internal Medicine

## 2012-12-18 ENCOUNTER — Ambulatory Visit (INDEPENDENT_AMBULATORY_CARE_PROVIDER_SITE_OTHER): Payer: BC Managed Care – PPO | Admitting: *Deleted

## 2012-12-18 DIAGNOSIS — Z950 Presence of cardiac pacemaker: Secondary | ICD-10-CM

## 2012-12-18 DIAGNOSIS — I442 Atrioventricular block, complete: Secondary | ICD-10-CM

## 2012-12-21 ENCOUNTER — Ambulatory Visit: Payer: BC Managed Care – PPO

## 2012-12-22 LAB — REMOTE PACEMAKER DEVICE
ATRIAL PACING PM: 2.65
BAMS-0001: 180 {beats}/min
BRDY-0002RA: 60 {beats}/min
BRDY-0003RA: 150 {beats}/min
RV LEAD IMPEDENCE PM: 504 Ohm

## 2012-12-24 ENCOUNTER — Encounter: Payer: Self-pay | Admitting: *Deleted

## 2013-01-14 ENCOUNTER — Ambulatory Visit: Payer: Self-pay | Admitting: Family Medicine

## 2013-01-14 IMAGING — US ULTRASOUND LEFT BREAST
1 series · 13 of 25 positions shown · non-contrast
Comparison: none

REASON FOR EXAM: LT BR LUMP UOQ BASELINE
COMMENTS:

PROCEDURE:     US  - US BREAST LEFT  - [DATE]  [DATE]
RESULT:     COMPARISON:  [DATE] [REDACTED] [HOSPITAL], [DATE]
[REDACTED] [HOSPITAL]
TECHNIQUE: Digital diagnostic mammograms were obtained. FDA approved
computer-aided detection (CAD) for mammography was utilized for this study.
Real-time sonography of the left breast was performed from the [DATE] to the
[DATE] position.
BREAST COMPOSITION: The breast composition is SCATTERED FIBROGLANDULAR
TISSUE (glandular tissue is  25-50%)

[Series 1: ultrasound left breast · 0.10mm/px · 13 of 42 slices shown]
[im 1/42]
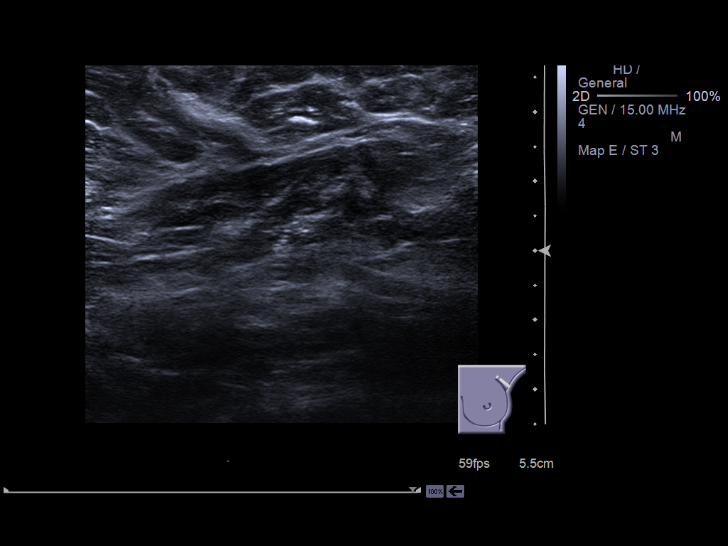
[im 4/42]
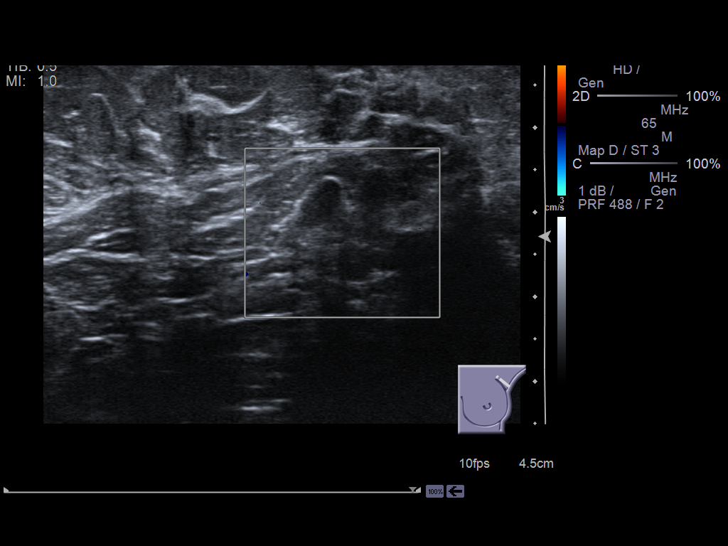
[im 7/42]
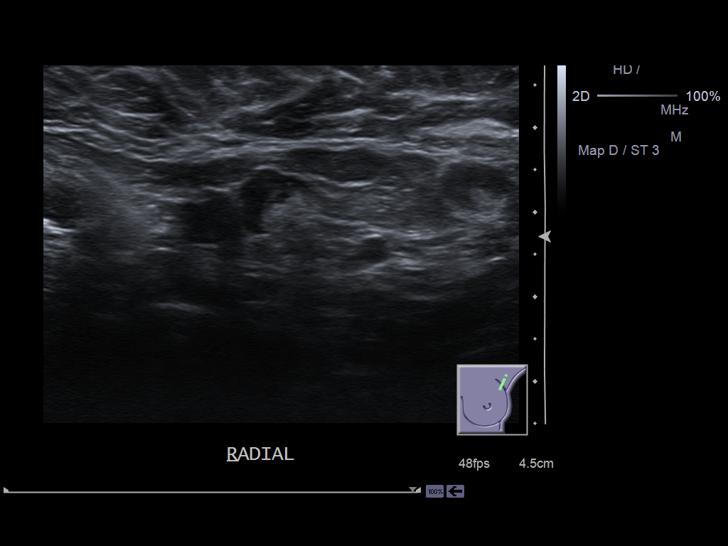
[im 11/42]
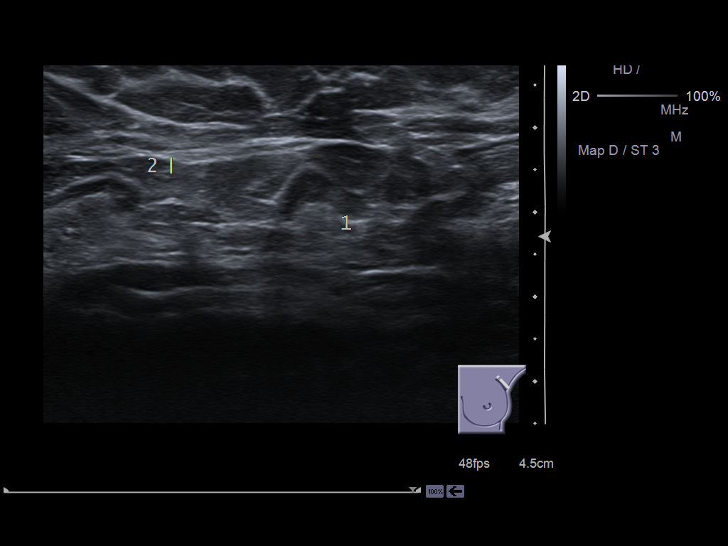
[im 14/42]
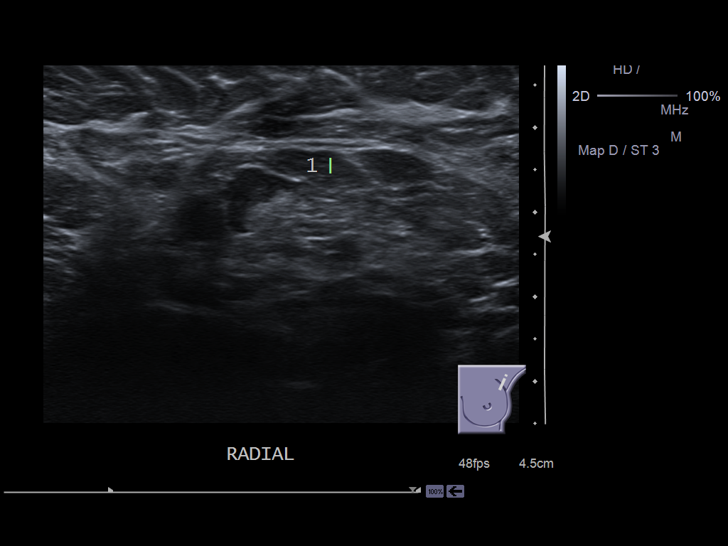
[im 18/42]
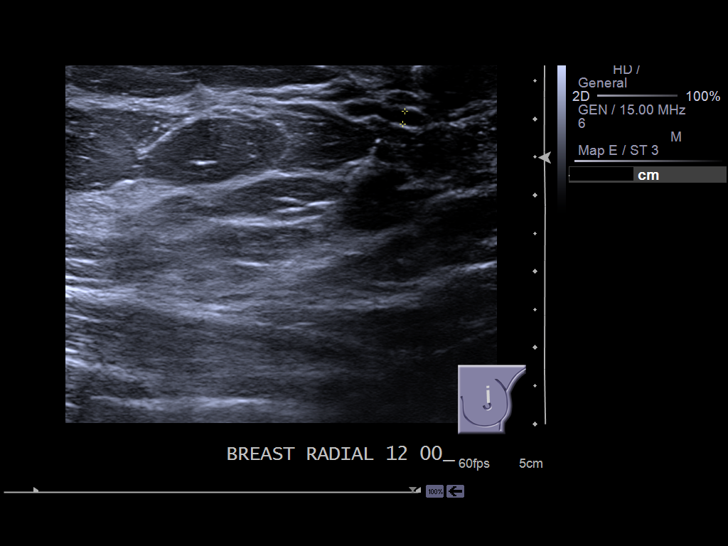
[im 21/42]
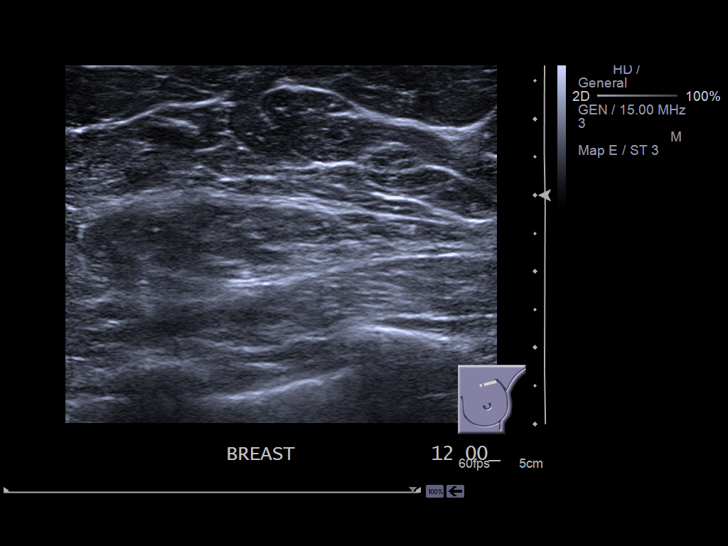
[im 24/42]
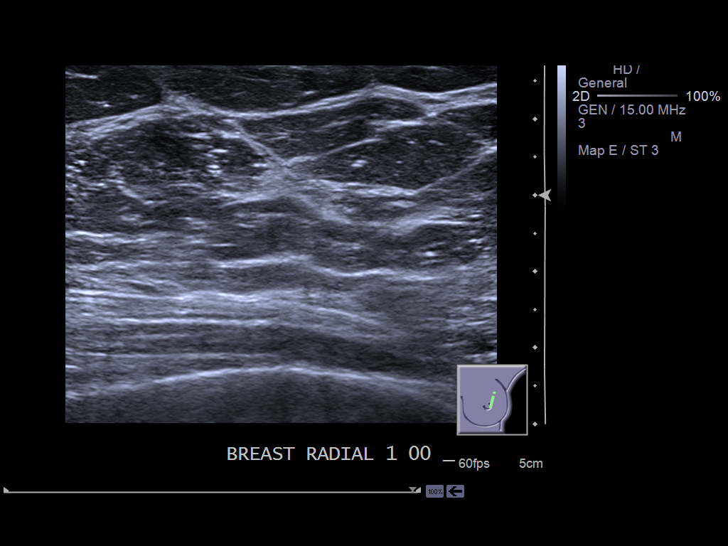
[im 28/42]
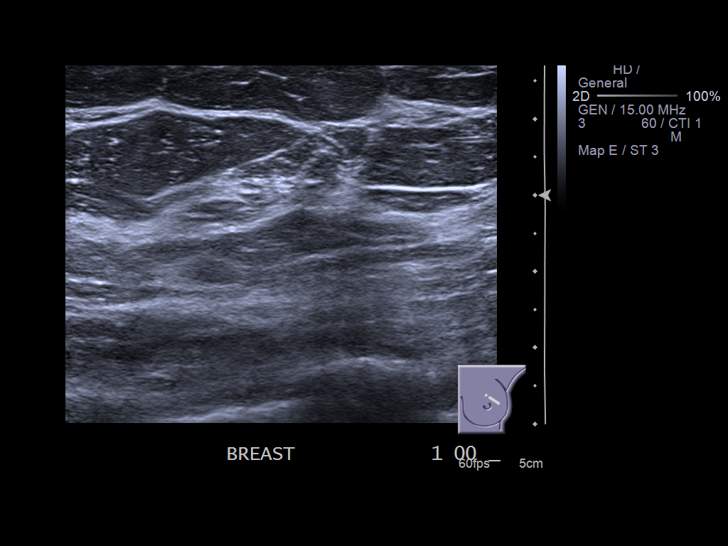
[im 31/42]
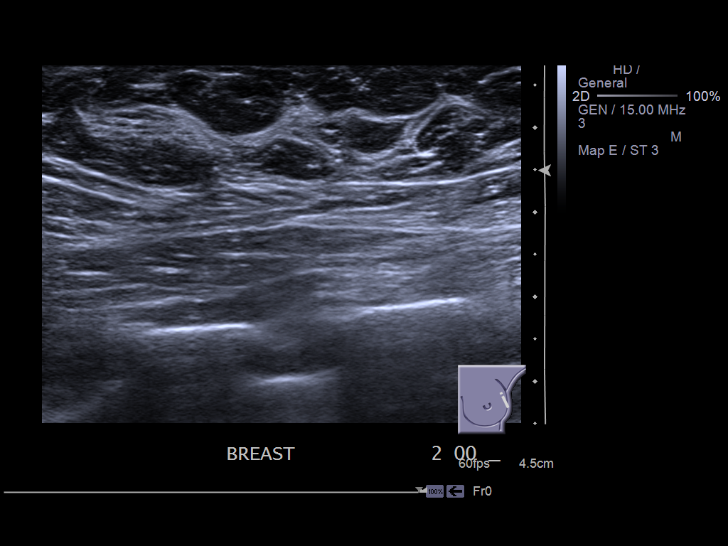
[im 35/42]
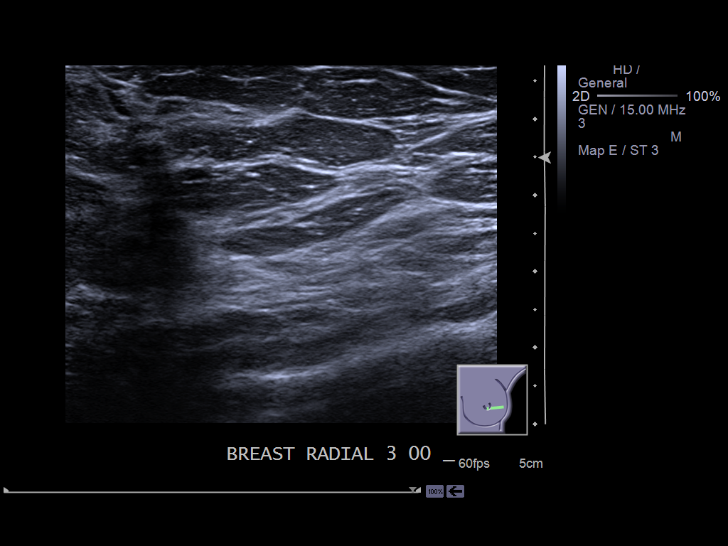
[im 38/42]
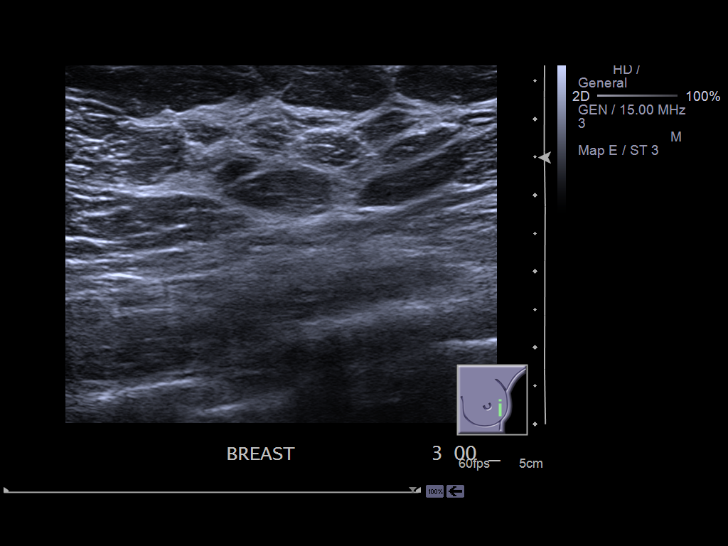
[im 42/42]
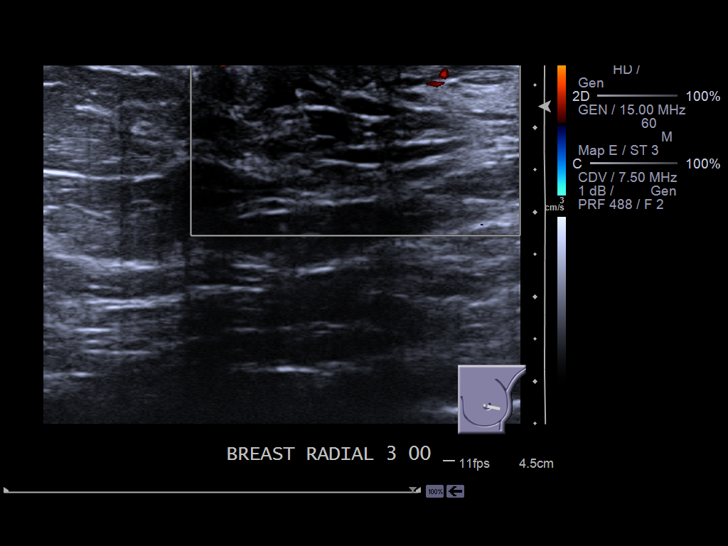

[13 of 25 positions shown; findings below may reference images not displayed]

FINDING: There is no dominant mass, architectural distortion or clusters of
suspicious microcalcifications.

Real-time sonography of the left breast was performed from the [DATE] to the
[DATE] position. There is a hypoechoic nodule with hyperechoic hilum most
consistent with benign appearing lymph nodes. There is no other solid or
cystic mass.
IMPRESSION: 1.     Stable bilateral mammogram. At the site of palpable abnormality there
is a benign appearing lymph node in the left axilla.
2.     Annual mammographic follow up recommended.

BI-RADS:  Category 1- Negative

A negative mammogram report does not preclude biopsy or other evaluation of
a clinically palpable or otherwise suspicious mass or lesion. Breast cancer
may not be detected by mammography in up to 10% of cases.

[REDACTED]

## 2013-01-14 IMAGING — MG MM CAD DIAGNOSTIC MAMMO
1 series · 8 of 8 positions shown · non-contrast
Comparison: none

REASON FOR EXAM: LT BR LUMP UOQ BASELINE
COMMENTS:

PROCEDURE:     MAM - MAM DGTL DIAGNOSTIC MAMMO W/CAD  - [DATE]  [DATE]
RESULT:     COMPARISON:  [DATE] [REDACTED] [HOSPITAL], [DATE]
[REDACTED] [HOSPITAL]
TECHNIQUE: Digital diagnostic mammograms were obtained. FDA approved
computer-aided detection (CAD) for mammography was utilized for this study.
Real-time sonography of the left breast was performed from the [DATE] to the
[DATE] position.
BREAST COMPOSITION: The breast composition is SCATTERED FIBROGLANDULAR
TISSUE (glandular tissue is  25-50%)

[R CC · right · 8 of 8 slices shown]
[im 1/8]
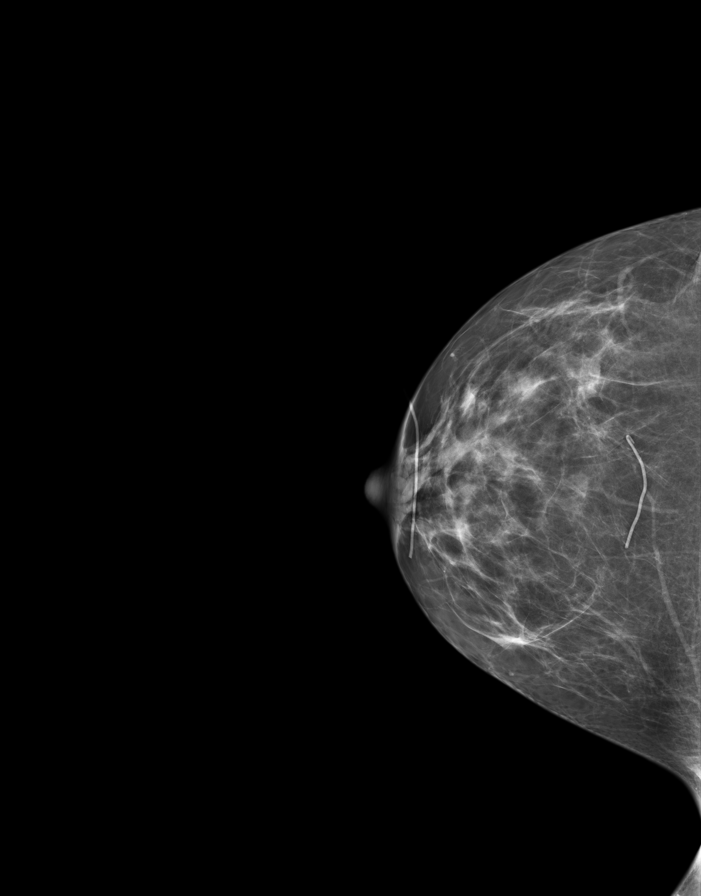
[im 2/8]
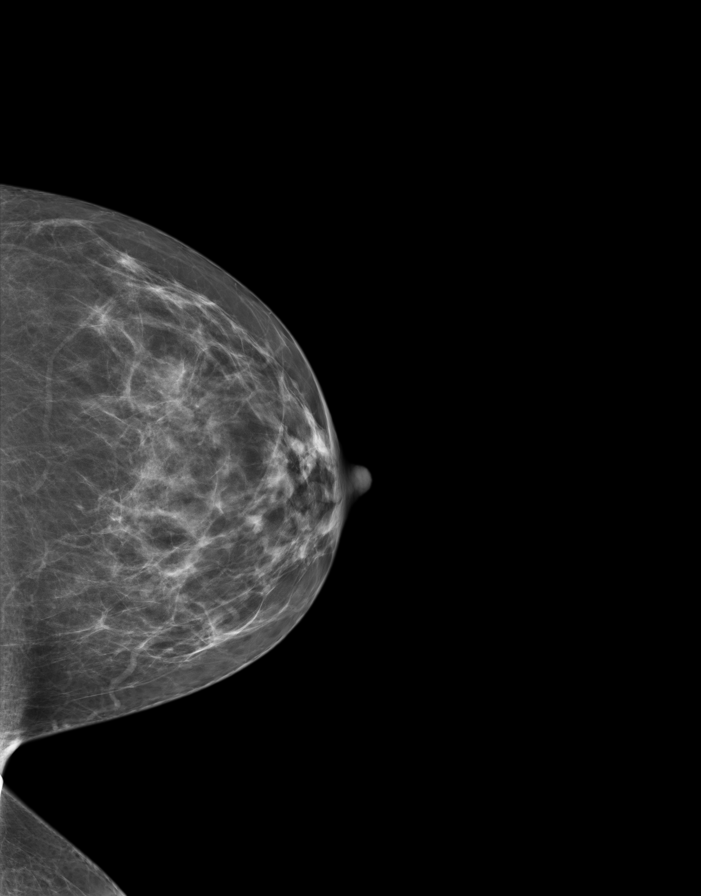
[im 3/8]
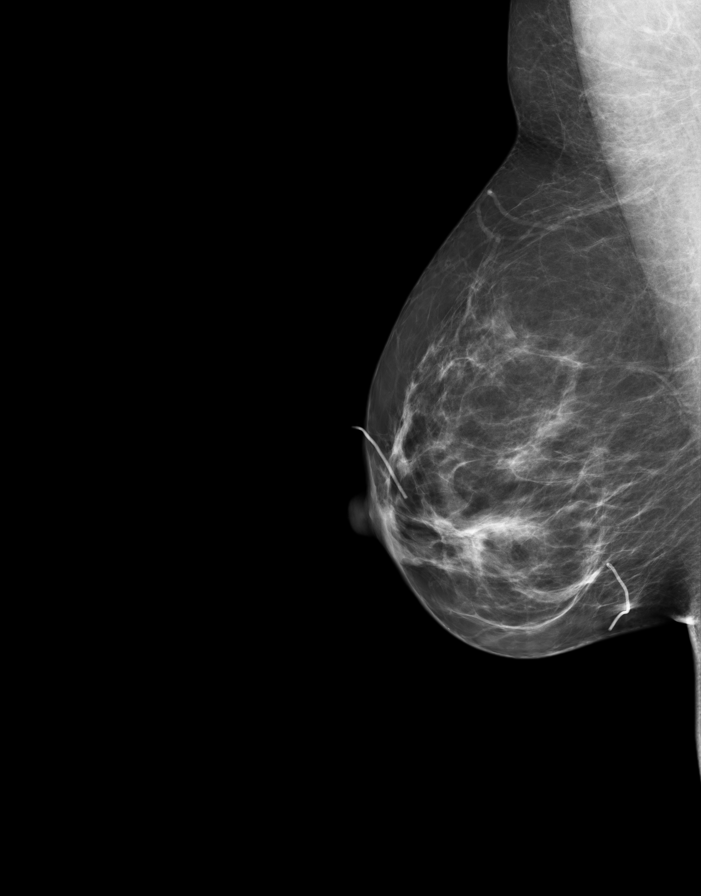
[im 4/8]
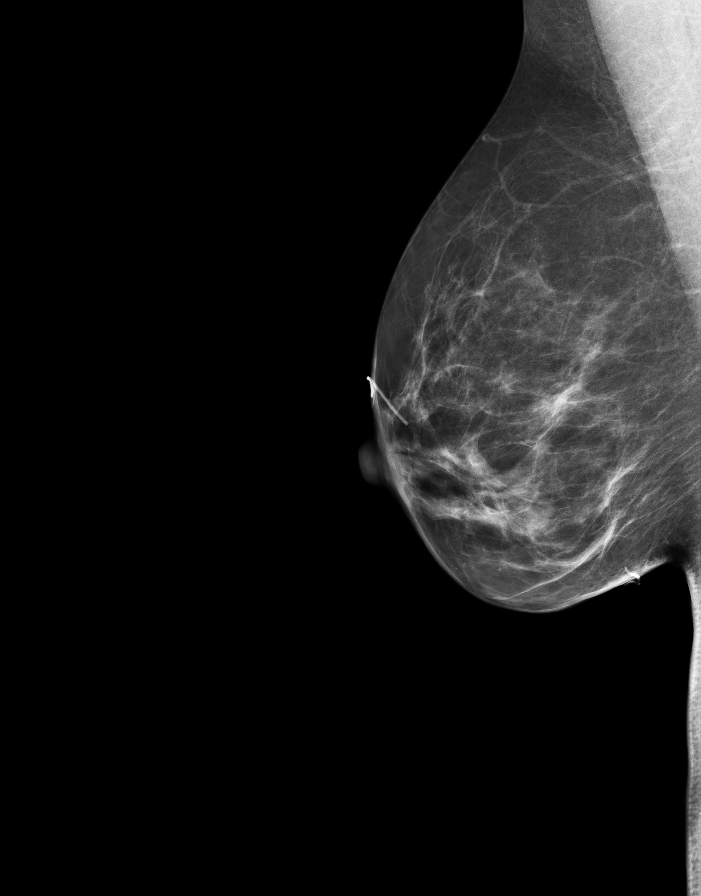
[im 5/8]
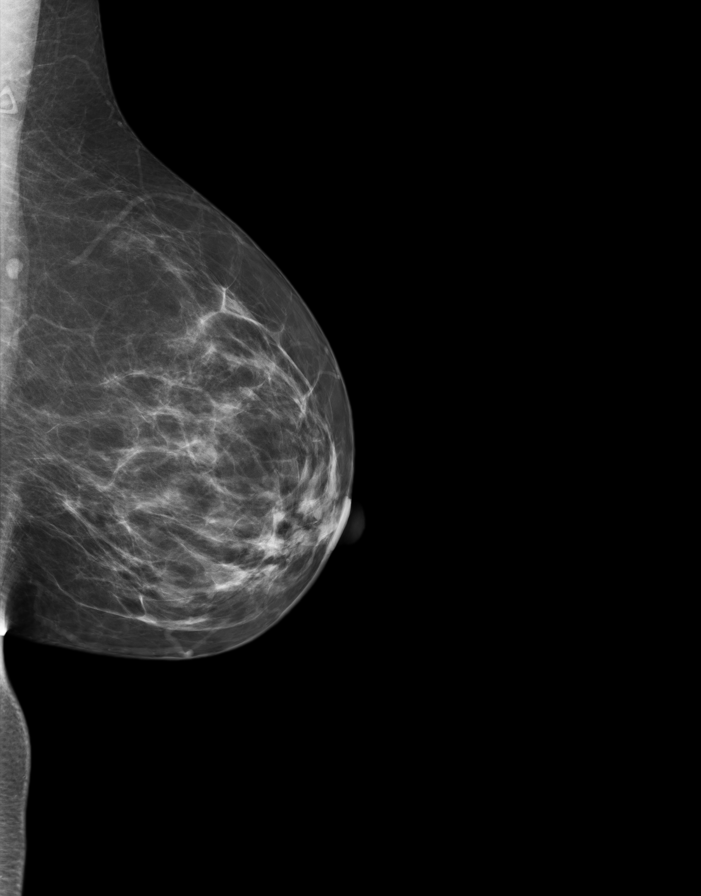
[im 6/8]
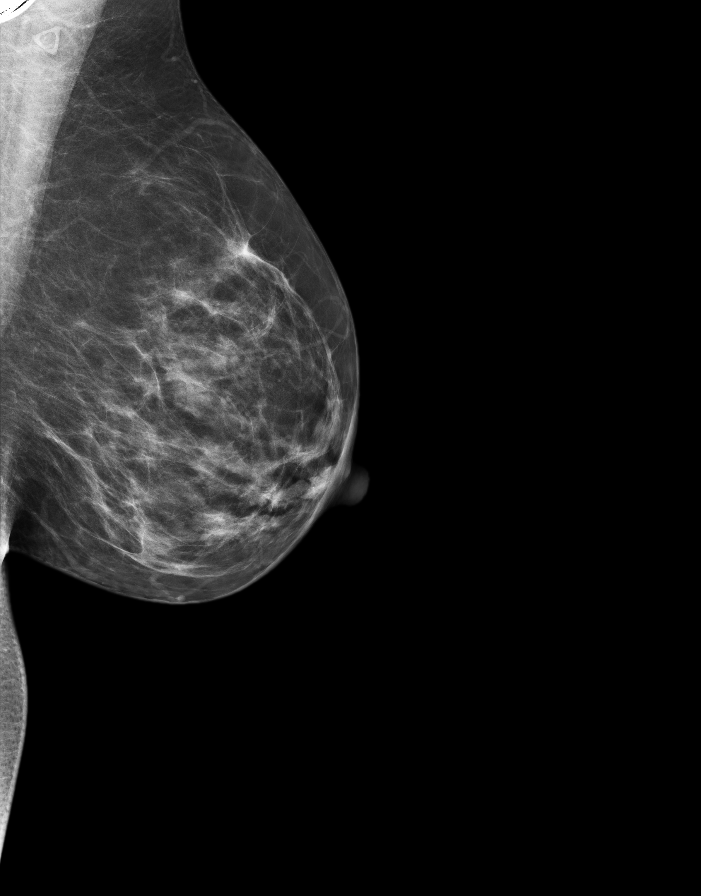
[im 7/8]
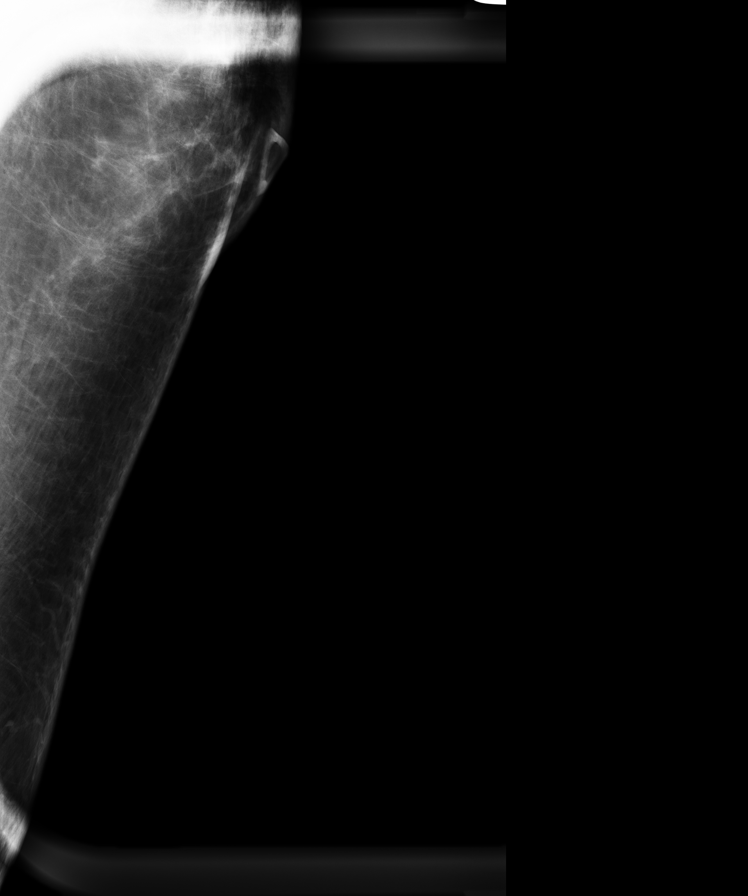
[im 8/8]
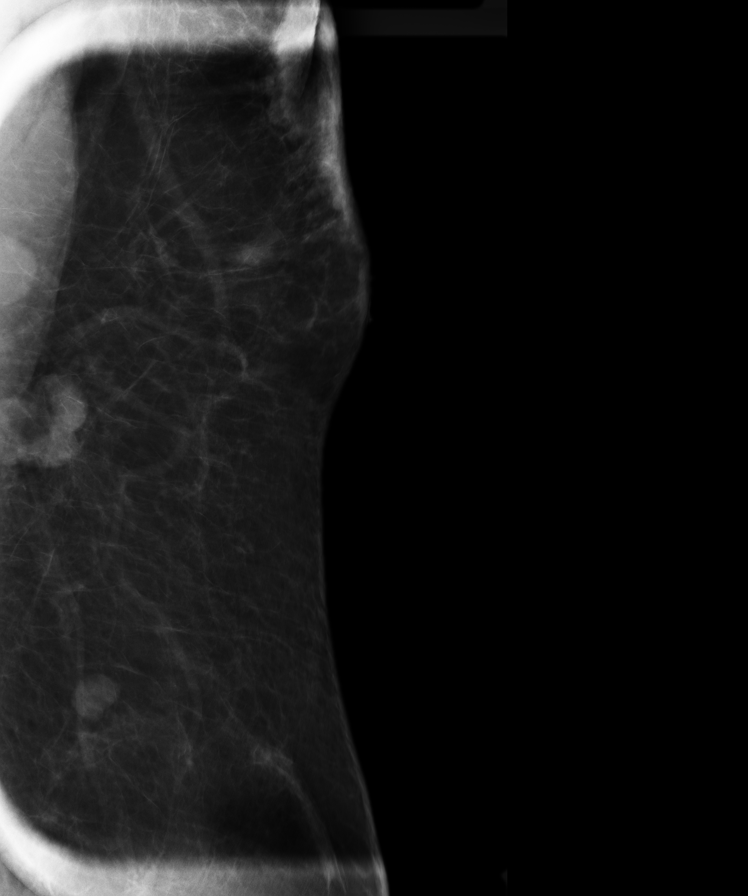

[8 of 8 positions shown; findings below may reference images not displayed]

FINDING: There is no dominant mass, architectural distortion or clusters of
suspicious microcalcifications.

Real-time sonography of the left breast was performed from the [DATE] to the
[DATE] position. There is a hypoechoic nodule with hyperechoic hilum most
consistent with benign appearing lymph nodes. There is no other solid or
cystic mass.
IMPRESSION: 1.     Stable bilateral mammogram. At the site of palpable abnormality there
is a benign appearing lymph node in the left axilla.
2.     Annual mammographic follow up recommended.

BI-RADS:  Category 1- Negative

A negative mammogram report does not preclude biopsy or other evaluation of
a clinically palpable or otherwise suspicious mass or lesion. Breast cancer
may not be detected by mammography in up to 10% of cases.

[REDACTED]

## 2013-03-19 ENCOUNTER — Telehealth: Payer: Self-pay | Admitting: Internal Medicine

## 2013-03-19 NOTE — Telephone Encounter (Signed)
New message    Want device clinic to know she downloaded her pacer ck today.  She does not have a landline phone and was at her grandmother's house---so she used her phone.

## 2013-03-22 ENCOUNTER — Ambulatory Visit (INDEPENDENT_AMBULATORY_CARE_PROVIDER_SITE_OTHER): Payer: BC Managed Care – PPO | Admitting: *Deleted

## 2013-03-22 DIAGNOSIS — I442 Atrioventricular block, complete: Secondary | ICD-10-CM

## 2013-03-22 DIAGNOSIS — Z950 Presence of cardiac pacemaker: Secondary | ICD-10-CM

## 2013-03-22 NOTE — Telephone Encounter (Signed)
Spoke w/pt in regards to transmission. Transmission received.

## 2013-03-29 LAB — REMOTE PACEMAKER DEVICE
AL AMPLITUDE: 2.5 mv
BATTERY VOLTAGE: 2.9 V
BRDY-0003RA: 150 {beats}/min
BRDY-0004RA: 150 {beats}/min

## 2013-04-08 ENCOUNTER — Other Ambulatory Visit: Payer: Self-pay

## 2013-04-09 ENCOUNTER — Encounter: Payer: Self-pay | Admitting: *Deleted

## 2013-04-20 ENCOUNTER — Encounter: Payer: Self-pay | Admitting: Internal Medicine

## 2013-06-24 ENCOUNTER — Encounter: Payer: Self-pay | Admitting: Internal Medicine

## 2013-06-24 ENCOUNTER — Ambulatory Visit (INDEPENDENT_AMBULATORY_CARE_PROVIDER_SITE_OTHER): Payer: BC Managed Care – PPO | Admitting: Internal Medicine

## 2013-06-24 VITALS — BP 123/79 | HR 141 | Ht 64.0 in | Wt 176.0 lb

## 2013-06-24 DIAGNOSIS — I442 Atrioventricular block, complete: Secondary | ICD-10-CM

## 2013-06-24 DIAGNOSIS — G909 Disorder of the autonomic nervous system, unspecified: Secondary | ICD-10-CM

## 2013-06-24 DIAGNOSIS — I471 Supraventricular tachycardia: Secondary | ICD-10-CM | POA: Insufficient documentation

## 2013-06-24 DIAGNOSIS — R000 Tachycardia, unspecified: Secondary | ICD-10-CM

## 2013-06-24 DIAGNOSIS — Z950 Presence of cardiac pacemaker: Secondary | ICD-10-CM

## 2013-06-24 DIAGNOSIS — G901 Familial dysautonomia [Riley-Day]: Secondary | ICD-10-CM

## 2013-06-24 LAB — MDC_IDC_ENUM_SESS_TYPE_INCLINIC
Brady Statistic AP VP Percent: 2.4 %
Brady Statistic AP VS Percent: 0.04 %
Brady Statistic AS VS Percent: 0.05 %
Brady Statistic RA Percent Paced: 2.45 %
Brady Statistic RV Percent Paced: 99.91 %
Lead Channel Impedance Value: 360 Ohm
Lead Channel Sensing Intrinsic Amplitude: 2.1 mV
Lead Channel Setting Pacing Amplitude: 2 V
Lead Channel Setting Pacing Pulse Width: 0.4 ms
Lead Channel Setting Sensing Sensitivity: 0.9 mV
MDC IDC MSMT BATTERY VOLTAGE: 2.89 V
MDC IDC MSMT LEADCHNL RV IMPEDANCE VALUE: 488 Ohm
MDC IDC MSMT LEADCHNL RV SENSING INTR AMPL: 6.8 mV
MDC IDC SESS DTM: 20150122175734
MDC IDC SET LEADCHNL RV PACING AMPLITUDE: 2.5 V
MDC IDC STAT BRADY AS VP PERCENT: 97.5 %
Zone Setting Detection Interval: 360 ms
Zone Setting Detection Interval: 430 ms

## 2013-06-24 MED ORDER — METOPROLOL TARTRATE 50 MG PO TABS: 50.0000 mg | ORAL_TABLET | Freq: Two times a day (BID) | ORAL | Status: DC

## 2013-06-24 NOTE — Progress Notes (Signed)
      Patient Care Team: Golden Pop, MD as PCP - General (Unknown Physician Specialty)   HPI  Angela [name] is a 43 y.o. 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    She's had significant GI symptoms related to her dysautonomia. She is oscillating between constipation and diarrhea. She continues to have orthostatic intolerance  And significant tachycardia palpitations.  This has gotten significantly worse over the last couple of months and is associated with worsening exercise tolerance. She's had a little peripheral edema. She's had some recumbent cough.     Past Medical History  Diagnosis Date  . SVT (supraventricular tachycardia)     s/p ablation a. c/b AV nod ablation requiring pacemaker  . Chest pain     a cath 2/10. EF 50-55% normal coronaries  . Depression   . Dyspnea     CT negative for PE, 2007  . OCD (obsessive compulsive disorder)   . Hypertension   . Insomnia   . Dysautonomia     Past Surgical History  Procedure Laterality Date  . Pacemaker insertion    . Pelvic laparoscopy  1990  . Tubal ligation  2003  . Breast lumpectomy      RIGHT -BREAST   . Endometrial ablation  2005  . Spark  2005    Current Outpatient Prescriptions  Medication Sig Dispense Refill  . ALPRAZolam (XANAX) 1 MG tablet Take 1 mg by mouth at bedtime as needed for anxiety.      Marland Kitchen buPROPion (WELLBUTRIN SR) 100 MG 12 hr tablet 100 mg 2 (two) times daily.      . clonazePAM (KLONOPIN) 0.5 MG tablet Take 1 mg by mouth 2 (two) times daily as needed. Take one tablet in the morning and one half in the evening      . DULoxetine (CYMBALTA) 60 MG capsule Take 30 mg in the AM and 30 mg in the PM      . Linaclotide (LINZESS) 145 MCG CAPS capsule Take 145 mcg by mouth daily.      . meloxicam (MOBIC) 15 MG tablet Take  15 mg by mouth daily.      . metoprolol tartrate (LOPRESSOR) 25 MG tablet TAKE ONE TABLET BY MOUTH TWICE DAILY  60 tablet  8  . Multiple Vitamins-Minerals (MULTIVITAMINS THER. W/MINERALS) TABS Take 1 tablet by mouth daily.       No current facility-administered medications for this visit.    Allergies  Allergen Reactions  . Toprol Xl [Metoprolol Succinate]     Review of Systems negative except from HPI and PMH  Physical Exam BP 139/85  Pulse 73  Ht 5\' 4"  (1.626 m)  Wt 176 lb (79.833 kg)  BMI 30.20 kg/m2 Well developed and well nourished in no acute distress HENT normal E scleral and icterus clear Neck Supple JVP 8-9 cm carotids brisk and full Clear to ausculation  *Regular rate and rhythm, no murmurs gallops or rub Soft with active bowel sounds No clubbing cyanosis Trace Edema Alert and oriented, grossly normal motor and sensory function Skin Warm and Dry  ECG demonstrates what appears to be sinus rhythm with rates ranging from 90--145. Intracardiac electrograms demonstrates a stable atrial e Gram suggestive of a specific focus.  Assessment and  Plan so

## 2013-06-24 NOTE — Patient Instructions (Signed)
Your physician has recommended you make the following change in your medication:  1) Increase Metoprolol to 50 mg twice daily  Your physician has requested that you have an echocardiogram. Echocardiography is a painless test that uses sound waves to create images of your heart. It provides your doctor with information about the size and shape of your heart and how well your heart's chambers and valves are working. This procedure takes approximately one hour. There are no restrictions for this procedure.  Your physician recommends that you schedule a follow-up appointment in: 2 months with Dr. Caryl Comes.

## 2013-06-24 NOTE — Assessment & Plan Note (Signed)
The patient is having a tachycardia which is quite symptomatic. I am not sure this is sinus or atrial. The heart rate variations are striking even while sitting in without movement. There was no overdrive suppression however with pacing. I don't understand whether this is a secondary tachycardia, apparently blood work was normal 2 months or so ago.  We will plan to do an echo to look for evidence of LV dysfunction as there is mild edema jugulovenous distention at this time. We'll check her oxygen saturation to exclude the possibility of hypoxia possibly related to pulmonary embolism causing tachycardia.

## 2013-06-24 NOTE — Assessment & Plan Note (Signed)
We will plan to increase her metoprolol for both her blood pressure as well As above

## 2013-06-24 NOTE — Assessment & Plan Note (Signed)
She has intrinsic conduction albeit with Wenckebach physiology with her heart rates ranging from 90--150

## 2013-06-28 ENCOUNTER — Ambulatory Visit (HOSPITAL_COMMUNITY): Payer: BC Managed Care – PPO | Attending: Cardiology | Admitting: Cardiology

## 2013-06-28 DIAGNOSIS — Z950 Presence of cardiac pacemaker: Secondary | ICD-10-CM | POA: Insufficient documentation

## 2013-06-28 DIAGNOSIS — R0609 Other forms of dyspnea: Secondary | ICD-10-CM | POA: Insufficient documentation

## 2013-06-28 DIAGNOSIS — R0989 Other specified symptoms and signs involving the circulatory and respiratory systems: Principal | ICD-10-CM

## 2013-06-28 DIAGNOSIS — R5381 Other malaise: Secondary | ICD-10-CM | POA: Insufficient documentation

## 2013-06-28 DIAGNOSIS — R609 Edema, unspecified: Secondary | ICD-10-CM

## 2013-06-28 DIAGNOSIS — I471 Supraventricular tachycardia: Secondary | ICD-10-CM

## 2013-06-28 DIAGNOSIS — I442 Atrioventricular block, complete: Secondary | ICD-10-CM

## 2013-06-28 DIAGNOSIS — R5383 Other fatigue: Secondary | ICD-10-CM

## 2013-06-28 DIAGNOSIS — I079 Rheumatic tricuspid valve disease, unspecified: Secondary | ICD-10-CM | POA: Insufficient documentation

## 2013-06-28 DIAGNOSIS — R000 Tachycardia, unspecified: Secondary | ICD-10-CM

## 2013-06-28 DIAGNOSIS — I498 Other specified cardiac arrhythmias: Secondary | ICD-10-CM | POA: Insufficient documentation

## 2013-06-28 DIAGNOSIS — I10 Essential (primary) hypertension: Secondary | ICD-10-CM | POA: Insufficient documentation

## 2013-06-28 DIAGNOSIS — R059 Cough, unspecified: Secondary | ICD-10-CM | POA: Insufficient documentation

## 2013-06-28 DIAGNOSIS — R05 Cough: Secondary | ICD-10-CM | POA: Insufficient documentation

## 2013-06-28 DIAGNOSIS — R0600 Dyspnea, unspecified: Secondary | ICD-10-CM

## 2013-06-28 NOTE — Progress Notes (Signed)
Echo performed. 

## 2013-07-01 ENCOUNTER — Telehealth: Payer: Self-pay | Admitting: Internal Medicine

## 2013-07-01 NOTE — Telephone Encounter (Signed)
I called the patient with her echo results. She reported that she feels "horrible" since the settings on her device were changed last week. She states she doesn't even want to get out of bed. She feels her heart beating all over and when she lays down she has tingling to her lips and neck. She has increased her metoprolol as recommended last week. Her BP's are running from 100-127/ 62-80 range. She reports that her HR readings are consistently 60's when she checks it, but she feels like it is going faster than that. I explained I would review with Dr. Caryl Comes and call her back. Angela Lemmings, RN, BSN  Reviewed the patient's symptoms with Dr. Caryl Comes. He wants to see the patient in the office next week. He looked at his schedule and wants to see her on Tuesday 2/3 at 8:00 am in the [place] office. He has sent Gabriel Cirri a message to add the patient on. I attempted to call the patient back, but she did not answer. I left a message for her to call back, but did not leave details as the voice mail did not identify the patient. Angela Lemmings, RN, BSN

## 2013-07-02 ENCOUNTER — Telehealth: Payer: Self-pay

## 2013-07-02 NOTE — Telephone Encounter (Signed)
Message copied by Blain Pais on Fri Jul 02, 2013  8:18 AM ------      Message from: Deboraha Sprang      Created: Thu Jul 01, 2013  5:37 PM      Regarding: help !!!       Can u arrange for her to come in next tues 2/3 at 8 am      Thanks steve ------

## 2013-07-02 NOTE — Telephone Encounter (Signed)
LMOM to call back to schedule

## 2013-07-06 ENCOUNTER — Encounter: Payer: Self-pay | Admitting: Internal Medicine

## 2013-07-06 ENCOUNTER — Ambulatory Visit (INDEPENDENT_AMBULATORY_CARE_PROVIDER_SITE_OTHER): Payer: BC Managed Care – PPO | Admitting: Internal Medicine

## 2013-07-06 VITALS — BP 102/74 | HR 60 | Ht 64.0 in | Wt 176.5 lb

## 2013-07-06 DIAGNOSIS — I498 Other specified cardiac arrhythmias: Secondary | ICD-10-CM

## 2013-07-06 DIAGNOSIS — I4719 Other supraventricular tachycardia: Secondary | ICD-10-CM

## 2013-07-06 DIAGNOSIS — I471 Supraventricular tachycardia: Secondary | ICD-10-CM

## 2013-07-06 DIAGNOSIS — Z950 Presence of cardiac pacemaker: Secondary | ICD-10-CM

## 2013-07-06 DIAGNOSIS — I442 Atrioventricular block, complete: Secondary | ICD-10-CM

## 2013-07-06 LAB — MDC_IDC_ENUM_SESS_TYPE_INCLINIC
Battery Voltage: 2.89 V
Brady Statistic AP VP Percent: 25.26 %
Brady Statistic RA Percent Paced: 26.6 %
Brady Statistic RV Percent Paced: 77.4 %
Lead Channel Impedance Value: 360 Ohm
Lead Channel Impedance Value: 512 Ohm
Lead Channel Pacing Threshold Amplitude: 0.5 V
Lead Channel Pacing Threshold Pulse Width: 0.4 ms
Lead Channel Sensing Intrinsic Amplitude: 2.2514
Lead Channel Sensing Intrinsic Amplitude: 3.0701
Lead Channel Setting Pacing Amplitude: 2 V
Lead Channel Setting Pacing Pulse Width: 0.4 ms
Lead Channel Setting Sensing Sensitivity: 0.9 mV
MDC IDC MSMT LEADCHNL RA PACING THRESHOLD AMPLITUDE: 0.5 V
MDC IDC MSMT LEADCHNL RA PACING THRESHOLD PULSEWIDTH: 0.4 ms
MDC IDC SESS DTM: 20150203091631
MDC IDC SET LEADCHNL RV PACING AMPLITUDE: 2.5 V
MDC IDC STAT BRADY AP VS PERCENT: 1.34 %
MDC IDC STAT BRADY AS VP PERCENT: 52.14 %
MDC IDC STAT BRADY AS VS PERCENT: 21.26 %
Zone Setting Detection Interval: 330 ms
Zone Setting Detection Interval: 360 ms

## 2013-07-06 NOTE — Assessment & Plan Note (Signed)
We have reprogrammed her device back to DDD. Only a small percentage of her heart rates are faster than 110 beats per minute. We'll see how she does.

## 2013-07-06 NOTE — Patient Instructions (Addendum)
Your physician recommends that you schedule a follow-up appointment in:  Please Keep your 3/23 appt with Dr. Caryl Comes   Remote monitoring is used to monitor your Pacemaker of ICD from home. This monitoring reduces the number of office visits required to check your device to one time per year. It allows [place] to keep an eye on the functioning of your device to ensure it is working properly. You are scheduled for a device check from home on 10/07/13. You may send your transmission at any time that day. If you have a wireless device, the transmission will be sent automatically. After your physician reviews your transmission, you will receive a postcard with your next transmission date.

## 2013-07-06 NOTE — Progress Notes (Signed)
      Patient Care Team: Golden Pop, MD as PCP - General (Unknown Physician Specialty)   HPI  Angela [name] is a 43 y.o. 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    When we saw her last week she had problems with exercise intolerance of edema and we tried to reprogram her device DDD>> DDI. This is associated with a marked worsening exercise tolerance.       Past Medical History  Diagnosis Date  . SVT (supraventricular tachycardia)     s/p ablation a. c/b AV nod ablation requiring pacemaker  . Chest pain     a cath 2/10. EF 50-55% normal coronaries  . Depression   . Dyspnea     CT negative for PE, 2007  . OCD (obsessive compulsive disorder)   . Hypertension   . Insomnia   . Dysautonomia     Past Surgical History  Procedure Laterality Date  . Pacemaker insertion    . Pelvic laparoscopy  1990  . Tubal ligation  2003  . Breast lumpectomy      RIGHT -BREAST   . Endometrial ablation  2005  . Spark  2005    Current Outpatient Prescriptions  Medication Sig Dispense Refill  . ALPRAZolam (XANAX) 1 MG tablet Take 1 mg by mouth at bedtime as needed for anxiety.      Marland Kitchen buPROPion (WELLBUTRIN SR) 100 MG 12 hr tablet 100 mg 2 (two) times daily.      . clonazePAM (KLONOPIN) 0.5 MG tablet Take 1 mg by mouth 2 (two) times daily as needed. Take one tablet in the morning and one half in the evening      . DULoxetine (CYMBALTA) 60 MG capsule Take 30 mg in the AM and 30 mg in the PM      . Linaclotide (LINZESS) 145 MCG CAPS capsule Take 145 mcg by mouth daily.      . meloxicam (MOBIC) 15 MG tablet Take 15 mg by mouth as needed.       . metoprolol tartrate (LOPRESSOR) 50 MG tablet Take 1 tablet (50 mg total) by mouth 2 (two) times daily.  60 tablet  3  . Multiple Vitamins-Minerals (MULTIVITAMINS THER.  W/MINERALS) TABS Take 1 tablet by mouth daily.       No current facility-administered medications for this visit.    Allergies  Allergen Reactions  . Toprol Xl [Metoprolol Succinate]     Review of Systems negative except from HPI and PMH  Physical Exam BP 102/74  Pulse 60  Ht 5\' 4"  (1.626 m)  Wt 176 lb 8 oz (80.06 kg)  BMI 30.28 kg/m2 Well developed and well nourished in no acute distress HENT normal E scleral and icterus clear Neck Supple JVP 8-9 cm carotids brisk and full Clear to ausculation  *Regular rate and rhythm, no murmurs gallops or rub Soft with active bowel sounds No clubbing cyanosis Trace Edema Alert and oriented, grossly normal motor and sensory function Skin Warm and Dry   ECG demonstrates sinus rhythm with a competing junctional/ventricular escape rate and A-V dissociation

## 2013-07-06 NOTE — Assessment & Plan Note (Signed)
The patient's device was interrogated and the information was fully reviewed.  The device was reprogrammed to  As above

## 2013-07-19 ENCOUNTER — Encounter: Payer: Self-pay | Admitting: Internal Medicine

## 2013-08-09 ENCOUNTER — Ambulatory Visit (INDEPENDENT_AMBULATORY_CARE_PROVIDER_SITE_OTHER): Payer: BC Managed Care – PPO | Admitting: *Deleted

## 2013-08-09 DIAGNOSIS — I471 Supraventricular tachycardia: Secondary | ICD-10-CM

## 2013-08-09 DIAGNOSIS — I498 Other specified cardiac arrhythmias: Secondary | ICD-10-CM

## 2013-08-09 NOTE — Progress Notes (Signed)
My [facility] smart enrollment done.

## 2013-08-17 ENCOUNTER — Encounter: Payer: Self-pay | Admitting: *Deleted

## 2013-08-17 DIAGNOSIS — I951 Orthostatic hypotension: Secondary | ICD-10-CM

## 2013-08-17 DIAGNOSIS — R000 Tachycardia, unspecified: Secondary | ICD-10-CM

## 2013-08-17 DIAGNOSIS — G90A Postural orthostatic tachycardia syndrome (POTS): Secondary | ICD-10-CM | POA: Insufficient documentation

## 2013-08-23 ENCOUNTER — Ambulatory Visit (INDEPENDENT_AMBULATORY_CARE_PROVIDER_SITE_OTHER): Payer: BC Managed Care – PPO | Admitting: Internal Medicine

## 2013-08-23 ENCOUNTER — Encounter: Payer: Self-pay | Admitting: Internal Medicine

## 2013-08-23 VITALS — BP 124/86 | HR 80 | Ht 64.0 in | Wt 183.0 lb

## 2013-08-23 DIAGNOSIS — I471 Supraventricular tachycardia: Secondary | ICD-10-CM

## 2013-08-23 DIAGNOSIS — I498 Other specified cardiac arrhythmias: Secondary | ICD-10-CM

## 2013-08-23 DIAGNOSIS — I442 Atrioventricular block, complete: Secondary | ICD-10-CM

## 2013-08-23 LAB — MDC_IDC_ENUM_SESS_TYPE_INCLINIC
Brady Statistic AP VS Percent: 0.06 %
Brady Statistic AS VP Percent: 97.77 %
Brady Statistic AS VS Percent: 0.06 %
Brady Statistic RA Percent Paced: 2.17 %
Brady Statistic RV Percent Paced: 99.88 %
Date Time Interrogation Session: 20150323160011
Lead Channel Impedance Value: 368 Ohm
Lead Channel Impedance Value: 512 Ohm
Lead Channel Pacing Threshold Amplitude: 0.5 V
Lead Channel Pacing Threshold Amplitude: 1 V
Lead Channel Pacing Threshold Pulse Width: 0.4 ms
Lead Channel Pacing Threshold Pulse Width: 0.4 ms
Lead Channel Sensing Intrinsic Amplitude: 6.1402
Lead Channel Setting Pacing Amplitude: 2 V
Lead Channel Setting Pacing Amplitude: 2.5 V
Lead Channel Setting Sensing Sensitivity: 0.9 mV
MDC IDC MSMT BATTERY VOLTAGE: 2.88 V
MDC IDC MSMT LEADCHNL RA SENSING INTR AMPL: 3.1173
MDC IDC SET LEADCHNL RV PACING PULSEWIDTH: 0.4 ms
MDC IDC STAT BRADY AP VP PERCENT: 2.11 %
Zone Setting Detection Interval: 330 ms
Zone Setting Detection Interval: 360 ms

## 2013-08-23 NOTE — Patient Instructions (Signed)
Your physician recommends that you continue on your current medications as directed. Please refer to the Current Medication list given to you today.  Your physician wants you to follow-up in: 1 year with Dr. Caryl Comes.  You will receive a reminder letter in the mail two months in advance. If you don't receive a letter, please call our office to schedule the follow-up appointment.  Please send a transmission when you receive/set up your MySmart transmitter.

## 2013-08-23 NOTE — Progress Notes (Signed)
      Patient Care Team: Golden Pop, MD as PCP - General (Unknown Physician Specialty)   HPI  Angela [name] is a 43 y.o. 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   Because of some degree of atrial tachycardia/sinus tachycardia we have tried reprogrammed her device DDD--DDI. She did not tolerate this at all. We subsequently programmed her back with a lower upper tracking rate  She is doing better   Past Medical History  Diagnosis Date  . SVT (supraventricular tachycardia)     s/p ablation a. c/b AV nod ablation requiring pacemaker  . Chest pain     a cath 2/10. EF 50-55% normal coronaries  . Depression   . Dyspnea     CT negative for PE, 2007  . OCD (obsessive compulsive disorder)   . Hypertension   . Insomnia   . Dysautonomia     Past Surgical History  Procedure Laterality Date  . Pacemaker insertion    . Pelvic laparoscopy  1990  . Tubal ligation  2003  . Breast lumpectomy      RIGHT -BREAST   . Endometrial ablation  2005  . Spark  2005    Current Outpatient Prescriptions  Medication Sig Dispense Refill  . buPROPion (WELLBUTRIN SR) 100 MG 12 hr tablet 100 mg 2 (two) times daily.      . DULoxetine (CYMBALTA) 60 MG capsule Take 30 mg in the AM and 30 mg in the PM      . Linaclotide (LINZESS) 145 MCG CAPS capsule Take 145 mcg by mouth daily.      . meloxicam (MOBIC) 15 MG tablet Take 15 mg by mouth as needed.       . metoprolol tartrate (LOPRESSOR) 50 MG tablet Take 1 tablet (50 mg total) by mouth 2 (two) times daily.  60 tablet  3  . Multiple Vitamins-Minerals (MULTIVITAMINS THER. W/MINERALS) TABS Take 1 tablet by mouth daily.       No current facility-administered medications for this visit.    Allergies  Allergen Reactions  . Toprol Xl [Metoprolol Succinate]     Review of  Systems negative except from HPI and PMH  Physical Exam BP 115/83  Pulse 78  Ht 5\' 4"  (1.626 m)  Wt 183 lb (83.008 kg)  BMI 31.40 kg/m2 Well developed and nourished in no acute distress HENT normal Neck supple with JVP-flat Clear Regular rate and rhythm, no murmurs or gallops Abd-soft with active BS No Clubbing cyanosis edema Skin-warm and dry A & Oriented  Grossly normal sensory and motor function    Assessment and  Plan  Complete heart block  Pacemaker Medtronic  POTS  Obesity  Anxiety/depression    Doing relatively well from cardiovascular point of view  Her anxiety depression is problematic  And she is gaining weight despite efforts  She thinks it is cymbalta  She has not being able to keep in touch with Psychiatry And counseling  We discussed obesity  And needs to work on this   I will get her psychiatrist to followup  Have recommended [facility] as an alternative

## 2013-08-31 ENCOUNTER — Encounter: Payer: Self-pay | Admitting: Internal Medicine

## 2013-10-07 ENCOUNTER — Encounter: Payer: Self-pay | Admitting: Internal Medicine

## 2013-11-15 ENCOUNTER — Ambulatory Visit (INDEPENDENT_AMBULATORY_CARE_PROVIDER_SITE_OTHER): Payer: BC Managed Care – PPO | Admitting: *Deleted

## 2013-11-15 DIAGNOSIS — Z950 Presence of cardiac pacemaker: Secondary | ICD-10-CM

## 2013-11-16 NOTE — Progress Notes (Signed)
Remote pacemaker transmission.   

## 2013-12-13 LAB — HM PAP SMEAR: HM Pap smear: NEGATIVE

## 2013-12-15 ENCOUNTER — Ambulatory Visit (INDEPENDENT_AMBULATORY_CARE_PROVIDER_SITE_OTHER): Payer: BC Managed Care – PPO | Admitting: *Deleted

## 2013-12-15 DIAGNOSIS — Z950 Presence of cardiac pacemaker: Secondary | ICD-10-CM

## 2013-12-15 DIAGNOSIS — I442 Atrioventricular block, complete: Secondary | ICD-10-CM

## 2013-12-15 NOTE — Progress Notes (Signed)
Remote pacemaker transmission.   

## 2013-12-17 ENCOUNTER — Encounter: Payer: Self-pay | Admitting: Cardiology

## 2013-12-17 LAB — MDC_IDC_ENUM_SESS_TYPE_REMOTE
Battery Voltage: 2.86 V
Brady Statistic AS VS Percent: 0.15 %
Date Time Interrogation Session: 20150715132915
Lead Channel Impedance Value: 360 Ohm
Lead Channel Impedance Value: 504 Ohm
Lead Channel Sensing Intrinsic Amplitude: 3.1173
Lead Channel Setting Pacing Amplitude: 2 V
Lead Channel Setting Pacing Amplitude: 2.5 V
Lead Channel Setting Pacing Pulse Width: 0.4 ms
Lead Channel Setting Sensing Sensitivity: 0.9 mV
MDC IDC SET ZONE DETECTION INTERVAL: 360 ms
MDC IDC STAT BRADY AP VP PERCENT: 14.38 %
MDC IDC STAT BRADY AP VS PERCENT: 0.13 %
MDC IDC STAT BRADY AS VP PERCENT: 85.33 %
MDC IDC STAT BRADY RA PERCENT PACED: 14.51 %
MDC IDC STAT BRADY RV PERCENT PACED: 99.71 %
Zone Setting Detection Interval: 330 ms

## 2014-01-05 ENCOUNTER — Encounter: Payer: Self-pay | Admitting: Cardiology

## 2014-01-10 ENCOUNTER — Encounter: Payer: Self-pay | Admitting: Internal Medicine

## 2014-01-17 ENCOUNTER — Ambulatory Visit (INDEPENDENT_AMBULATORY_CARE_PROVIDER_SITE_OTHER): Payer: BC Managed Care – PPO | Admitting: *Deleted

## 2014-01-17 DIAGNOSIS — Z950 Presence of cardiac pacemaker: Secondary | ICD-10-CM

## 2014-01-17 DIAGNOSIS — I442 Atrioventricular block, complete: Secondary | ICD-10-CM

## 2014-01-17 LAB — MDC_IDC_ENUM_SESS_TYPE_REMOTE
Battery Voltage: 2.85 V
Brady Statistic AP VP Percent: 5.21 %
Brady Statistic AP VS Percent: 0.1 %
Brady Statistic AS VP Percent: 94.61 %
Brady Statistic AS VS Percent: 0.08 %
Brady Statistic RA Percent Paced: 5.31 %
Brady Statistic RV Percent Paced: 99.82 %
Date Time Interrogation Session: 20150817122809
Lead Channel Impedance Value: 496 Ohm
Lead Channel Sensing Intrinsic Amplitude: 3.3771
Lead Channel Setting Pacing Amplitude: 2 V
Lead Channel Setting Pacing Amplitude: 2.5 V
Lead Channel Setting Pacing Pulse Width: 0.4 ms
Lead Channel Setting Sensing Sensitivity: 0.9 mV
MDC IDC MSMT LEADCHNL RA IMPEDANCE VALUE: 384 Ohm
Zone Setting Detection Interval: 330 ms
Zone Setting Detection Interval: 360 ms

## 2014-01-17 NOTE — Progress Notes (Signed)
Remote pacemaker transmission.   

## 2014-01-17 NOTE — Addendum Note (Signed)
Addended by: Tiajuana Amass on: 01/17/2014 02:58 PM   Modules accepted: Level of Service

## 2014-02-01 ENCOUNTER — Ambulatory Visit: Payer: Self-pay | Admitting: Family Medicine

## 2014-02-01 ENCOUNTER — Encounter: Payer: Self-pay | Admitting: Cardiology

## 2014-02-01 IMAGING — MG MM DIGITAL SCREENING BILAT W/ CAD
2 series · 6 of 6 positions shown · non-contrast
Comparison: Previous Exam(s)

CLINICAL DATA: Screening.

EXAM:
DIGITAL SCREENING BILATERAL MAMMOGRAM WITH CAD

[R CC · right · 5 of 5 slices shown]
[im 1/5]
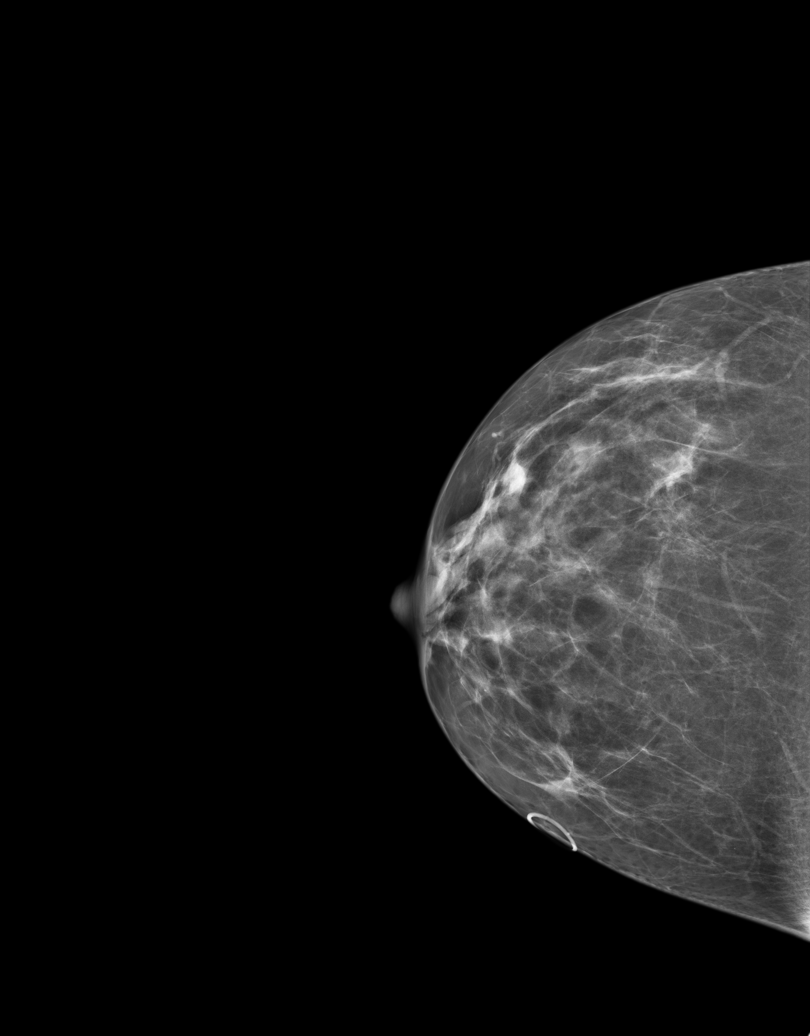
[im 2/5]
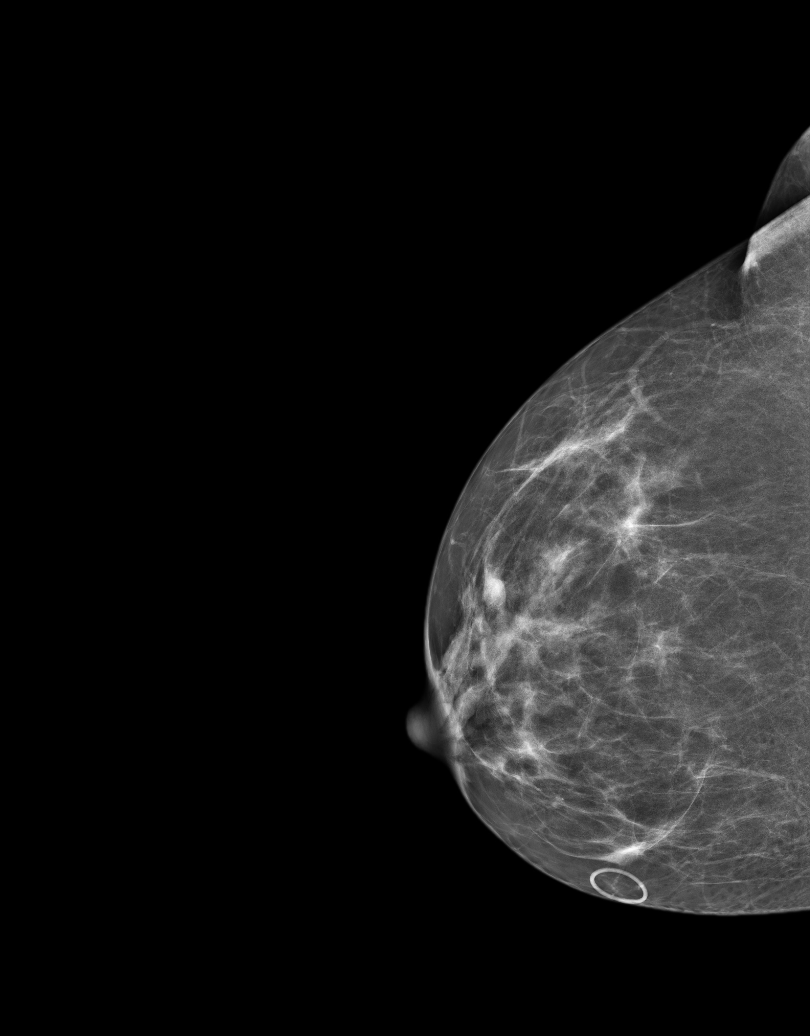
[im 3/5]
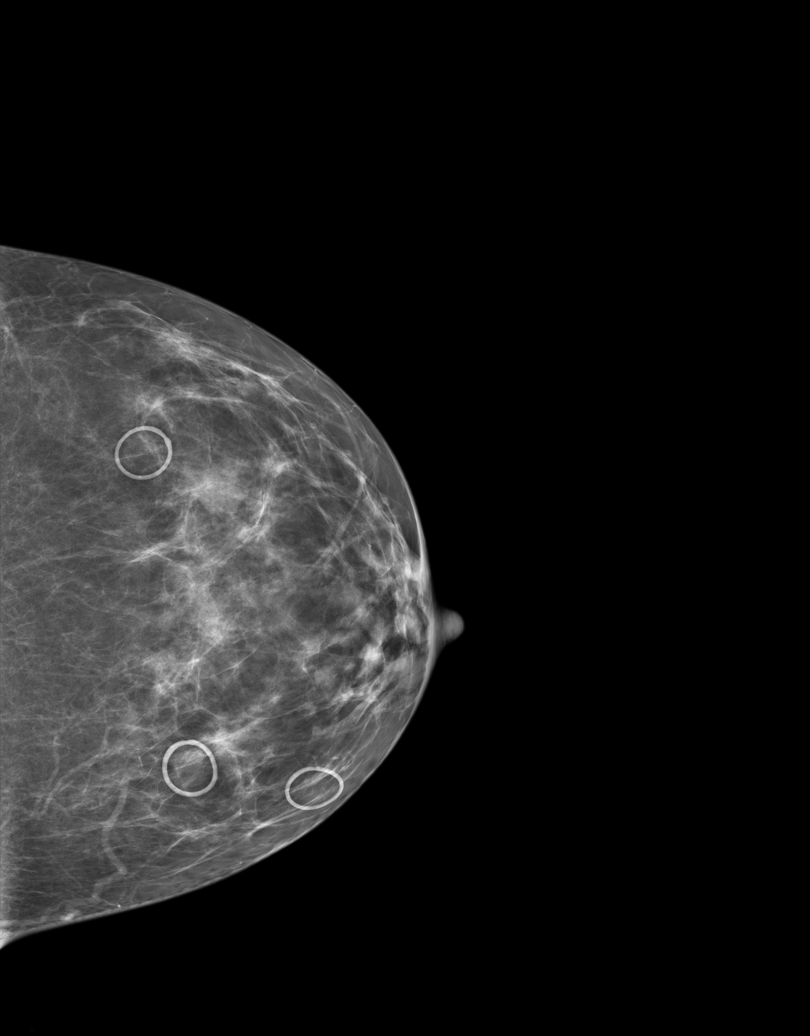
[im 4/5]
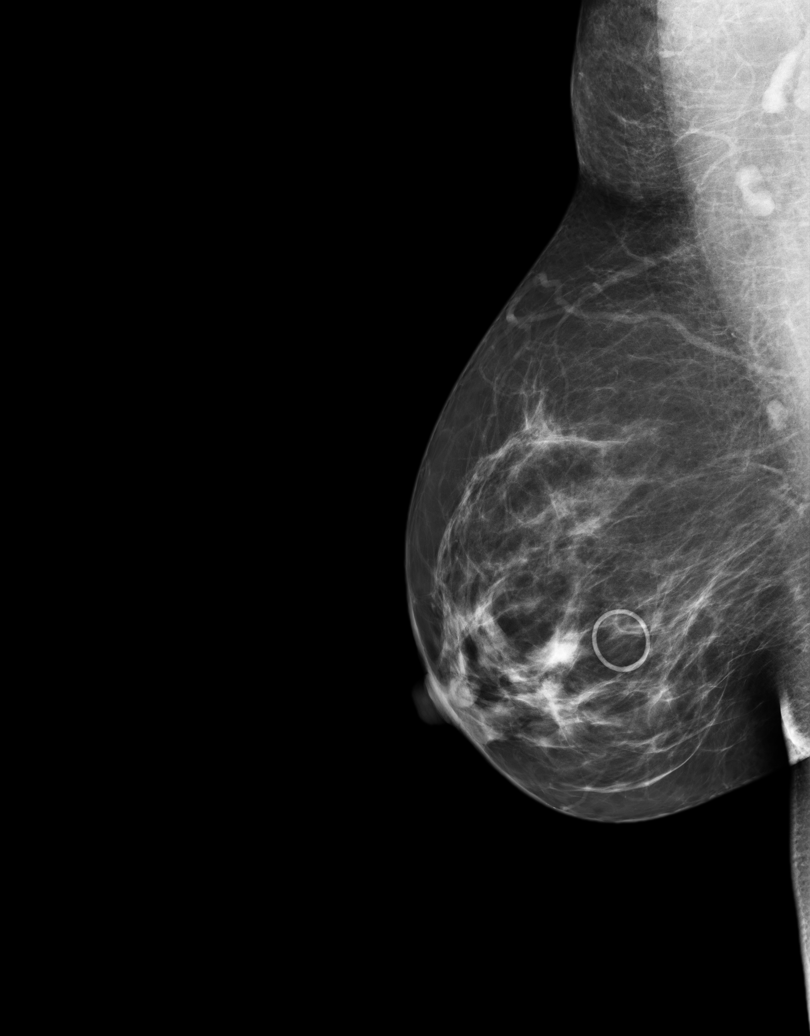
[im 5/5]
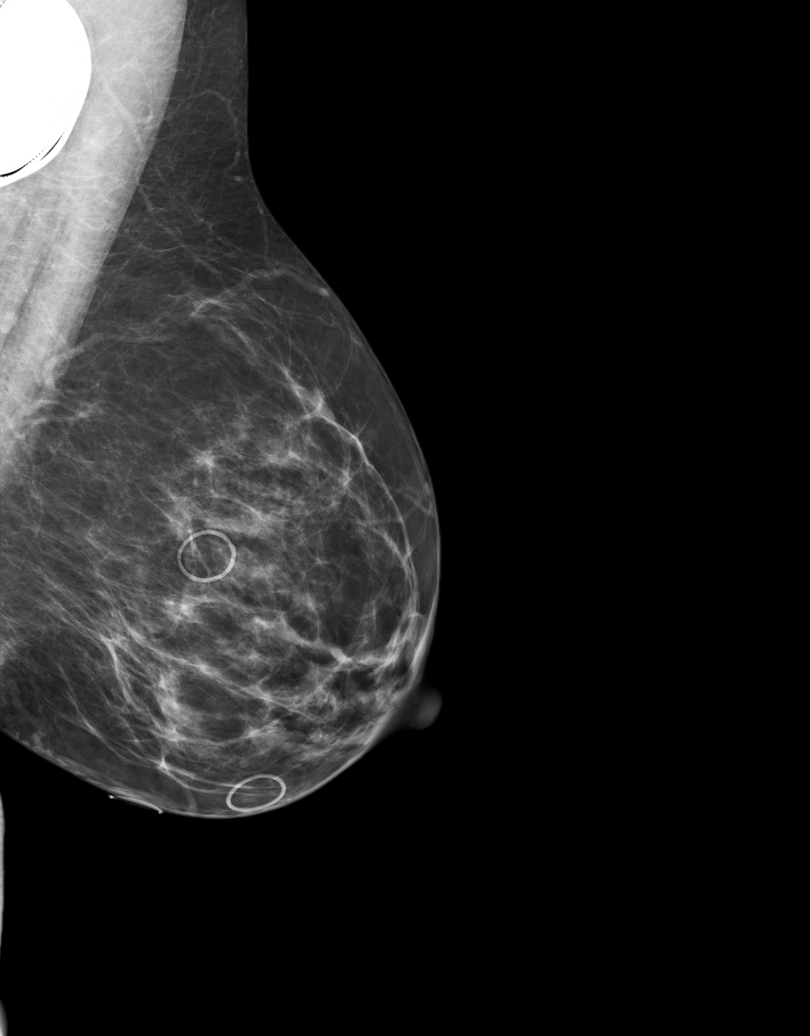

[R MLO]
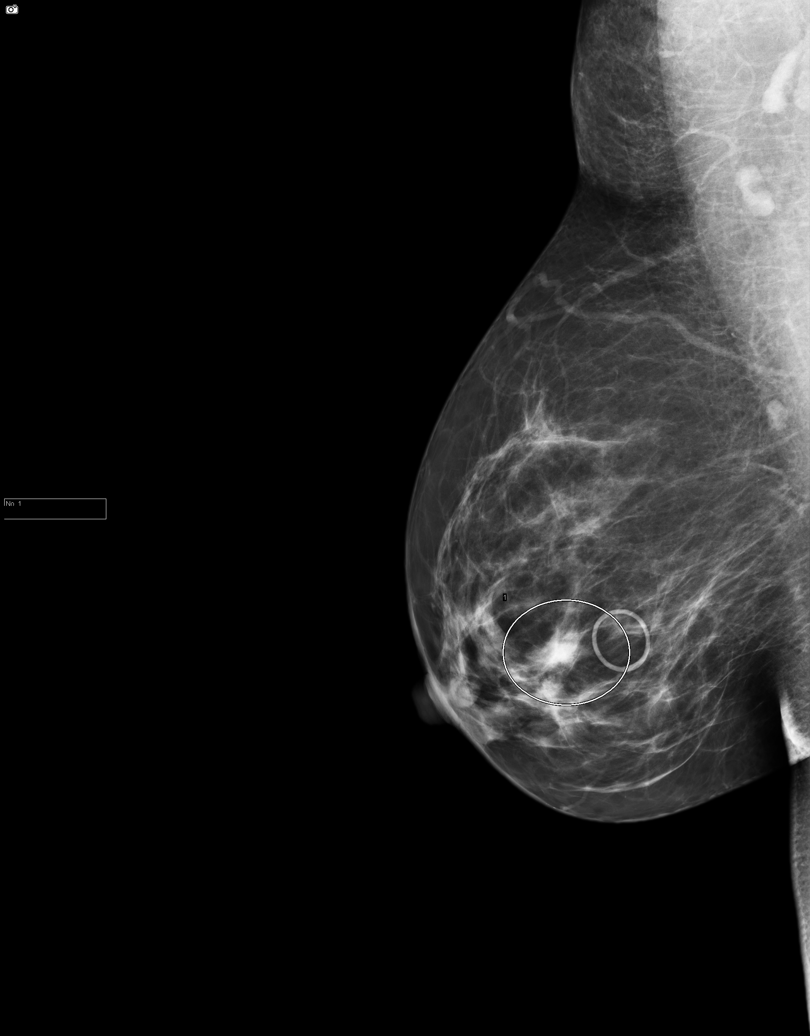

[6 of 6 positions shown; findings below may reference images not displayed]

ACR Breast Density Category c: The breast tissue is heterogeneously
dense, which may obscure small masses.
FINDINGS: In the right breast, a possible asymmetry warrants further
evaluation with spot compression views and possibly ultrasound. In
the left breast, no findings suspicious for malignancy. Images were
processed with CAD.
IMPRESSION: Further evaluation is suggested for possible asymmetry in the right
breast.

RECOMMENDATION:
Diagnostic mammogram and possibly ultrasound of the right breast.
(Code:[7G])

The patient will be contacted regarding the findings, and additional
imaging will be scheduled.

BI-RADS CATEGORY  0: Incomplete. Need additional imaging evaluation
and/or prior mammograms for comparison.

## 2014-02-03 ENCOUNTER — Ambulatory Visit: Payer: Self-pay | Admitting: Family Medicine

## 2014-02-03 IMAGING — MG MM ADDITIONAL VIEWS AT NO CHARGE
2 series · 2 of 2 positions shown · non-contrast
Comparison: [DATE], [DATE], [DATE], [DATE]

CLINICAL DATA: 42-year-old female, callback from screening
mammogram for possible right breast asymmetry

EXAM:
DIGITAL DIAGNOSTIC  RIGHT MAMMOGRAM WITH CAD

[R ML]
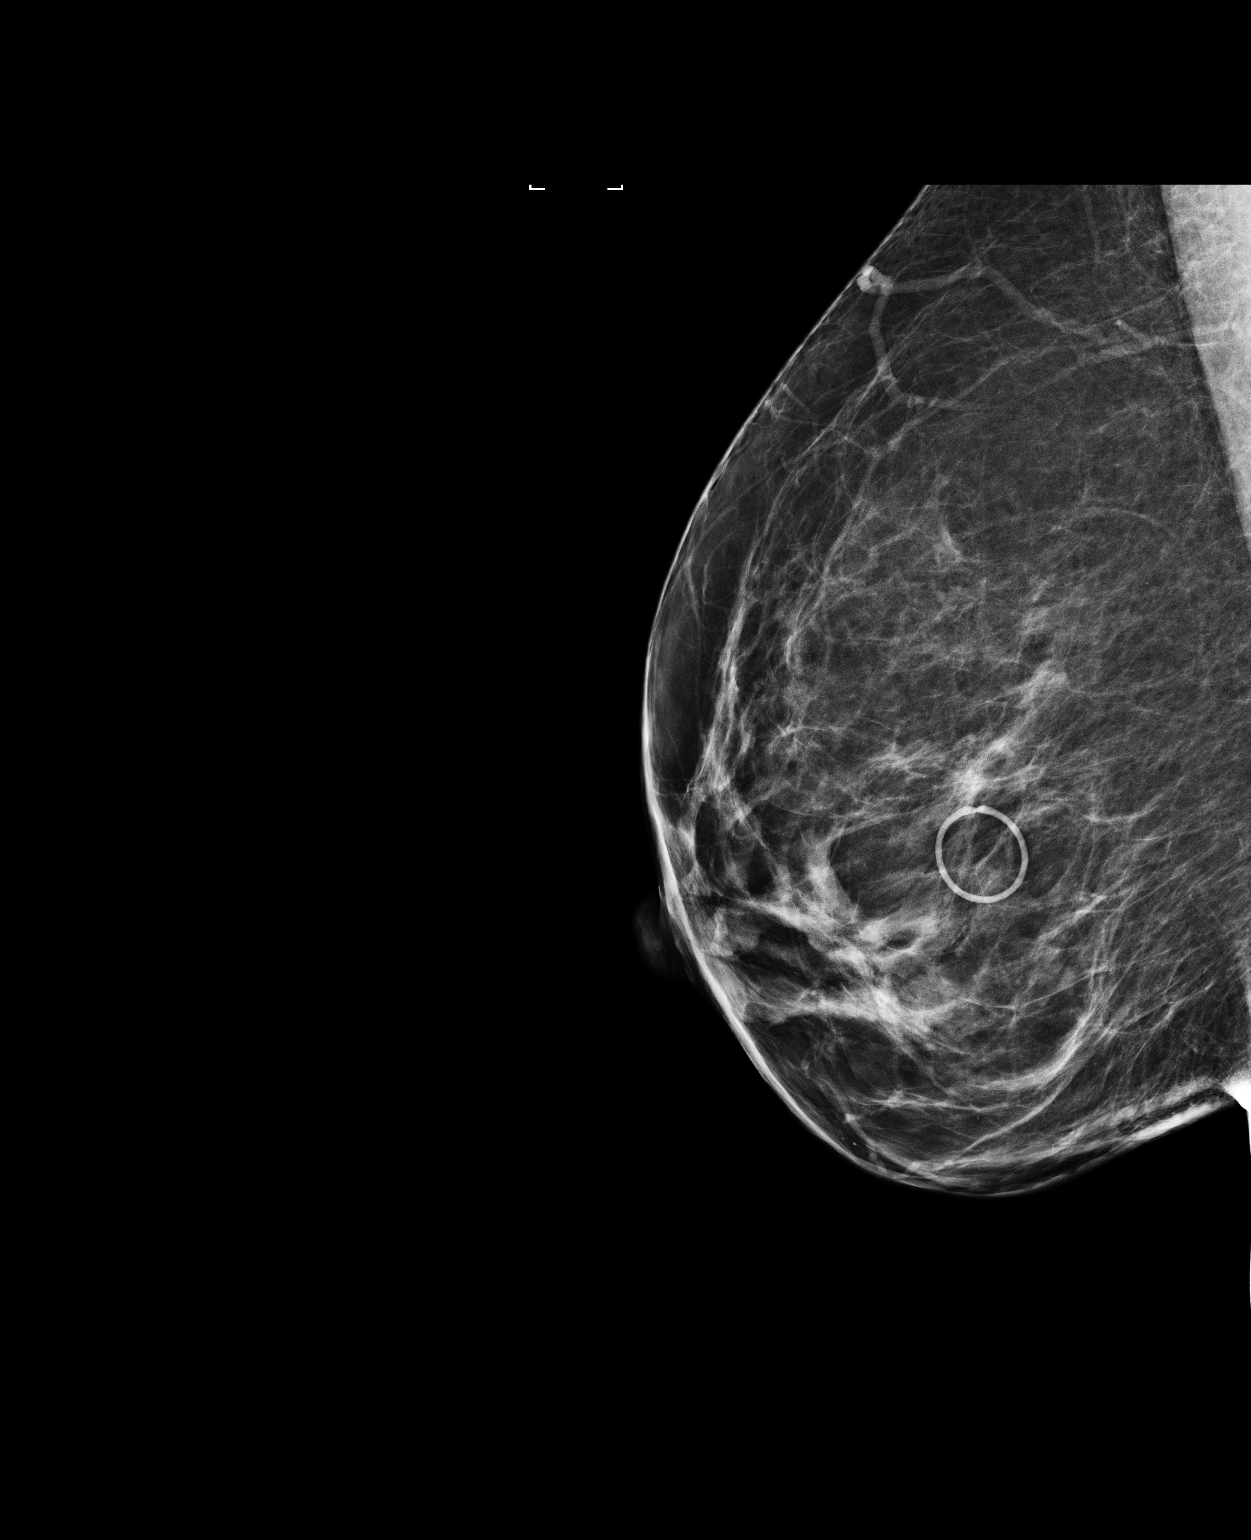

[R MLO]
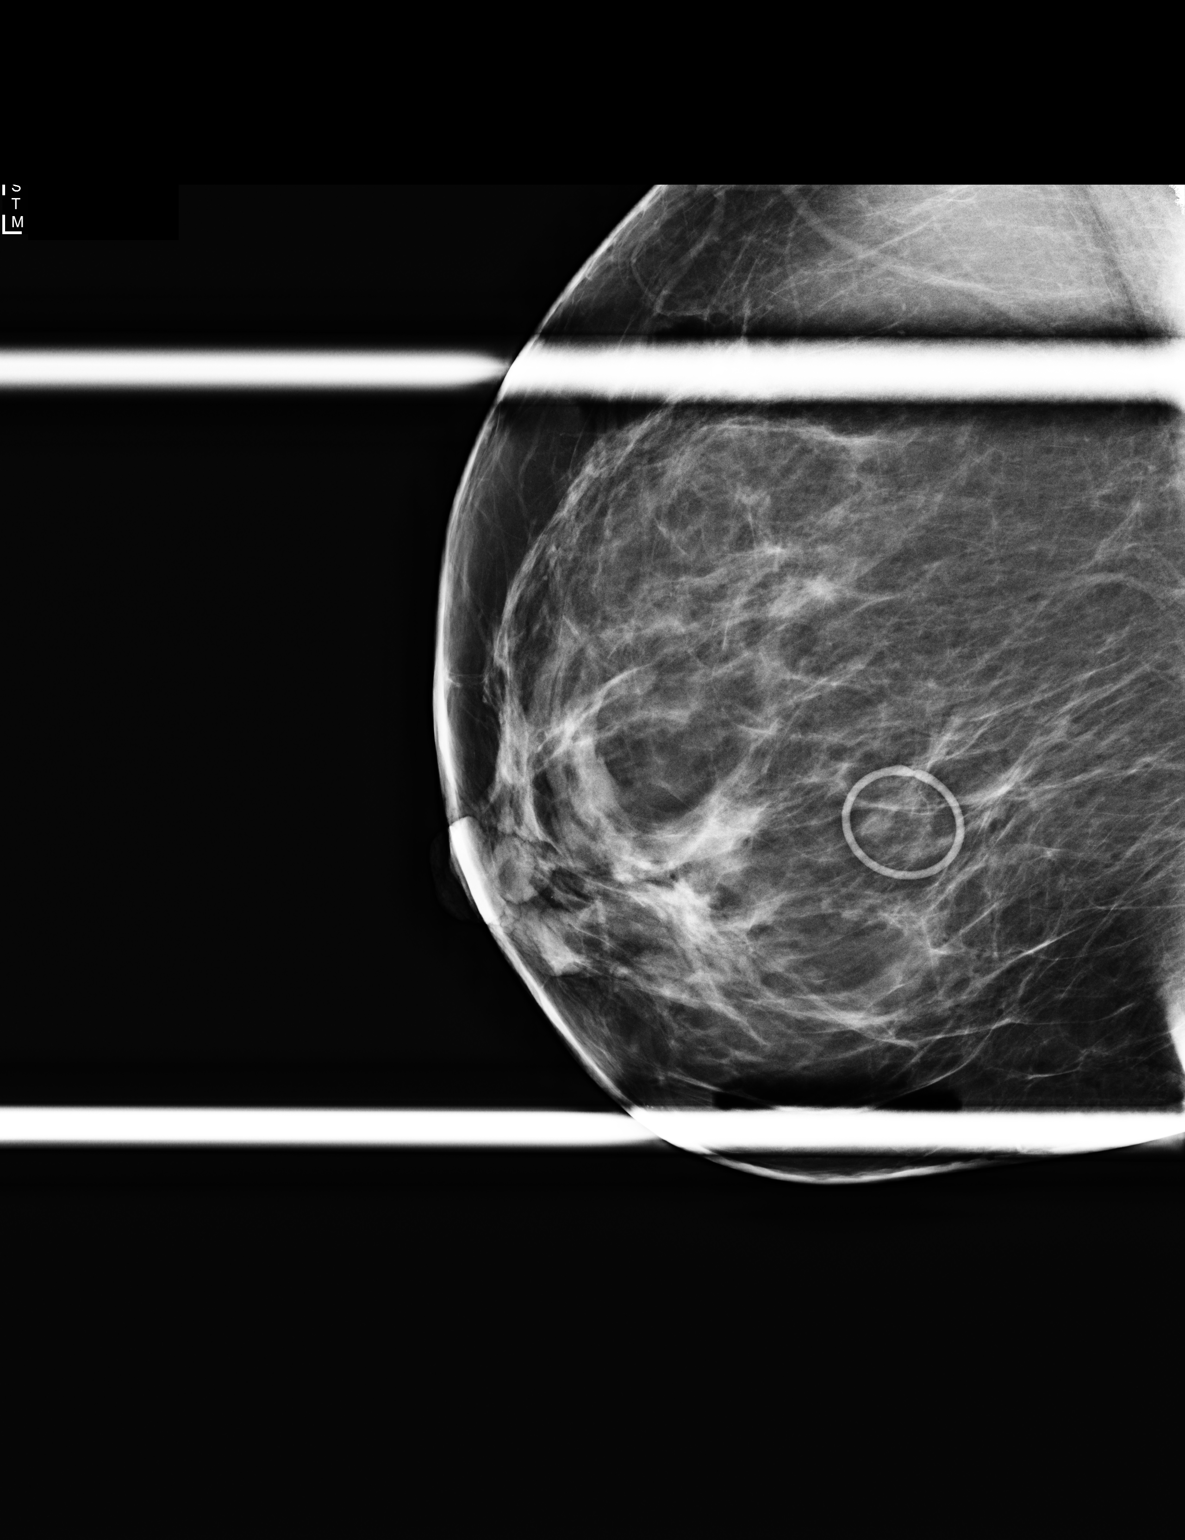

[2 of 2 positions shown; findings below may reference images not displayed]

ACR Breast Density Category c: The breast tissue is heterogeneously
dense, which may obscure small masses.
FINDINGS: On additional images, the possible right breast asymmetry does not
persist. No finding suspicious for malignancy is identified.

Mammographic images were processed with CAD.
IMPRESSION: No evidence of malignancy.

RECOMMENDATION:
Screening mammogram in one year.(Code:[4T])

I have discussed the findings and recommendations with the patient.
Results were also provided in writing at the conclusion of the
visit. If applicable, a reminder letter will be sent to the patient
regarding the next appointment.

BI-RADS CATEGORY  1: Negative

## 2014-02-21 ENCOUNTER — Encounter: Payer: Self-pay | Admitting: Internal Medicine

## 2014-02-21 ENCOUNTER — Ambulatory Visit (INDEPENDENT_AMBULATORY_CARE_PROVIDER_SITE_OTHER): Payer: BC Managed Care – PPO | Admitting: *Deleted

## 2014-02-21 DIAGNOSIS — Z950 Presence of cardiac pacemaker: Secondary | ICD-10-CM

## 2014-02-21 NOTE — Progress Notes (Signed)
Remote pacemaker transmission.   

## 2014-02-22 LAB — MDC_IDC_ENUM_SESS_TYPE_REMOTE
Battery Voltage: 2.85 V
Brady Statistic AP VS Percent: 0.1 %
Brady Statistic AS VS Percent: 0.16 %
Brady Statistic RV Percent Paced: 99.74 %
Date Time Interrogation Session: 20150921162605
Lead Channel Impedance Value: 376 Ohm
Lead Channel Impedance Value: 504 Ohm
Lead Channel Sensing Intrinsic Amplitude: 3.7523
Lead Channel Setting Pacing Amplitude: 2 V
Lead Channel Setting Pacing Amplitude: 2.5 V
Lead Channel Setting Sensing Sensitivity: 0.9 mV
MDC IDC MSMT LEADCHNL RA SENSING INTR AMPL: 3.1173
MDC IDC SET LEADCHNL RV PACING PULSEWIDTH: 0.4 ms
MDC IDC STAT BRADY AP VP PERCENT: 4.91 %
MDC IDC STAT BRADY AS VP PERCENT: 94.83 %
MDC IDC STAT BRADY RA PERCENT PACED: 5.01 %
Zone Setting Detection Interval: 330 ms
Zone Setting Detection Interval: 360 ms

## 2014-03-07 ENCOUNTER — Encounter: Payer: Self-pay | Admitting: Cardiology

## 2014-03-15 ENCOUNTER — Encounter: Payer: Self-pay | Admitting: Internal Medicine

## 2014-03-24 ENCOUNTER — Ambulatory Visit (INDEPENDENT_AMBULATORY_CARE_PROVIDER_SITE_OTHER): Payer: BC Managed Care – PPO | Admitting: *Deleted

## 2014-03-24 ENCOUNTER — Encounter: Payer: Self-pay | Admitting: Internal Medicine

## 2014-03-24 DIAGNOSIS — I442 Atrioventricular block, complete: Secondary | ICD-10-CM

## 2014-03-24 LAB — MDC_IDC_ENUM_SESS_TYPE_REMOTE
Battery Voltage: 2.83 V
Brady Statistic AP VS Percent: 0.09 %
Brady Statistic RA Percent Paced: 4.48 %
Brady Statistic RV Percent Paced: 99.78 %
Date Time Interrogation Session: 20151022115558
Lead Channel Impedance Value: 376 Ohm
Lead Channel Impedance Value: 488 Ohm
Lead Channel Sensing Intrinsic Amplitude: 3.6 mV
Lead Channel Setting Pacing Amplitude: 2.5 V
Lead Channel Setting Pacing Pulse Width: 0.4 ms
MDC IDC SET LEADCHNL RA PACING AMPLITUDE: 2 V
MDC IDC SET LEADCHNL RV SENSING SENSITIVITY: 0.9 mV
MDC IDC SET ZONE DETECTION INTERVAL: 330 ms
MDC IDC SET ZONE DETECTION INTERVAL: 360 ms
MDC IDC STAT BRADY AP VP PERCENT: 4.39 %
MDC IDC STAT BRADY AS VP PERCENT: 95.39 %
MDC IDC STAT BRADY AS VS PERCENT: 0.13 %

## 2014-03-24 NOTE — Progress Notes (Signed)
Remote pacemaker transmission.   

## 2014-04-04 ENCOUNTER — Encounter: Payer: Self-pay | Admitting: Internal Medicine

## 2014-04-06 ENCOUNTER — Encounter: Payer: Self-pay | Admitting: Cardiology

## 2014-04-25 ENCOUNTER — Ambulatory Visit (INDEPENDENT_AMBULATORY_CARE_PROVIDER_SITE_OTHER): Payer: BC Managed Care – PPO | Admitting: *Deleted

## 2014-04-25 DIAGNOSIS — Z950 Presence of cardiac pacemaker: Secondary | ICD-10-CM

## 2014-04-25 NOTE — Progress Notes (Signed)
Remote pacemaker transmission.   

## 2014-04-26 LAB — MDC_IDC_ENUM_SESS_TYPE_REMOTE
Battery Voltage: 2.83 V
Brady Statistic AP VP Percent: 4.77 %
Brady Statistic AS VP Percent: 95.06 %
Brady Statistic AS VS Percent: 0.07 %
Brady Statistic RA Percent Paced: 4.87 %
Brady Statistic RV Percent Paced: 99.83 %
Date Time Interrogation Session: 20151123135604
Lead Channel Impedance Value: 384 Ohm
Lead Channel Impedance Value: 504 Ohm
Lead Channel Setting Pacing Amplitude: 2 V
Lead Channel Setting Pacing Amplitude: 2.5 V
MDC IDC MSMT LEADCHNL RA SENSING INTR AMPL: 2.8575
MDC IDC SET LEADCHNL RV PACING PULSEWIDTH: 0.4 ms
MDC IDC SET LEADCHNL RV SENSING SENSITIVITY: 0.9 mV
MDC IDC SET ZONE DETECTION INTERVAL: 360 ms
MDC IDC STAT BRADY AP VS PERCENT: 0.1 %
Zone Setting Detection Interval: 330 ms

## 2014-05-18 ENCOUNTER — Encounter: Payer: Self-pay | Admitting: Internal Medicine

## 2014-05-19 NOTE — Telephone Encounter (Signed)
Remote received. No alerts or episodes. Pt having anxiety about nearing ERI. Pt felt her heart was skipping beats. Pt very near ERI. Battery showing 2.82V, ERI=2.81V. Updated pt on what to expect symptomatically when device reverts upon reaching ERI. Pt knows device can be reprogrammed back to DDD.

## 2014-05-25 ENCOUNTER — Other Ambulatory Visit: Payer: Self-pay | Admitting: Internal Medicine

## 2014-05-25 ENCOUNTER — Ambulatory Visit (INDEPENDENT_AMBULATORY_CARE_PROVIDER_SITE_OTHER): Payer: BC Managed Care – PPO | Admitting: *Deleted

## 2014-05-25 ENCOUNTER — Encounter: Payer: Self-pay | Admitting: Cardiology

## 2014-05-25 DIAGNOSIS — Z950 Presence of cardiac pacemaker: Secondary | ICD-10-CM

## 2014-05-25 LAB — MDC_IDC_ENUM_SESS_TYPE_REMOTE
Battery Voltage: 2.82 V
Brady Statistic AP VP Percent: 5.9 %
Brady Statistic AP VS Percent: 0.1 %
Brady Statistic AS VS Percent: 0.1 %
Lead Channel Sensing Intrinsic Amplitude: 3 mV
Lead Channel Setting Pacing Amplitude: 2 V
Lead Channel Setting Pacing Pulse Width: 0.4 ms
Lead Channel Setting Sensing Sensitivity: 0.9 mV
MDC IDC MSMT LEADCHNL RA IMPEDANCE VALUE: 376 Ohm
MDC IDC MSMT LEADCHNL RV IMPEDANCE VALUE: 488 Ohm
MDC IDC SET LEADCHNL RV PACING AMPLITUDE: 2.5 V
MDC IDC STAT BRADY AS VP PERCENT: 94 %
Zone Setting Detection Interval: 330 ms
Zone Setting Detection Interval: 360 ms

## 2014-05-25 NOTE — Progress Notes (Signed)
Remote pacemaker transmission.   

## 2014-05-27 ENCOUNTER — Other Ambulatory Visit: Payer: Self-pay | Admitting: Internal Medicine

## 2014-05-31 ENCOUNTER — Encounter: Payer: Self-pay | Admitting: Cardiology

## 2014-06-03 DIAGNOSIS — M1990 Unspecified osteoarthritis, unspecified site: Secondary | ICD-10-CM

## 2014-06-03 HISTORY — DX: Unspecified osteoarthritis, unspecified site: M19.90

## 2014-06-06 ENCOUNTER — Encounter: Payer: Self-pay | Admitting: Internal Medicine

## 2014-06-08 ENCOUNTER — Encounter: Payer: Self-pay | Admitting: Internal Medicine

## 2014-06-15 DIAGNOSIS — I951 Orthostatic hypotension: Secondary | ICD-10-CM | POA: Diagnosis not present

## 2014-06-15 DIAGNOSIS — I10 Essential (primary) hypertension: Secondary | ICD-10-CM | POA: Diagnosis not present

## 2014-06-16 ENCOUNTER — Encounter: Payer: Self-pay | Admitting: Internal Medicine

## 2014-06-23 ENCOUNTER — Ambulatory Visit (INDEPENDENT_AMBULATORY_CARE_PROVIDER_SITE_OTHER): Payer: Medicare Other | Admitting: *Deleted

## 2014-06-23 DIAGNOSIS — I442 Atrioventricular block, complete: Secondary | ICD-10-CM

## 2014-06-23 LAB — MDC_IDC_ENUM_SESS_TYPE_REMOTE
Battery Voltage: 2.82 V
Brady Statistic AP VP Percent: 2.1 %
Brady Statistic AS VS Percent: 0.07 %
Brady Statistic RV Percent Paced: 99.8 %
Date Time Interrogation Session: 20160121125823
Lead Channel Impedance Value: 504 Ohm
Lead Channel Sensing Intrinsic Amplitude: 3.2472
Lead Channel Setting Pacing Amplitude: 2 V
Lead Channel Setting Pacing Pulse Width: 0.4 ms
Lead Channel Setting Sensing Sensitivity: 0.9 mV
MDC IDC MSMT LEADCHNL RA IMPEDANCE VALUE: 376 Ohm
MDC IDC SET LEADCHNL RV PACING AMPLITUDE: 2.5 V
MDC IDC STAT BRADY AP VS PERCENT: 0.13 %
MDC IDC STAT BRADY AS VP PERCENT: 97.7 %
MDC IDC STAT BRADY RA PERCENT PACED: 2.23 %
Zone Setting Detection Interval: 330 ms
Zone Setting Detection Interval: 360 ms

## 2014-06-23 NOTE — Progress Notes (Signed)
Remote pacemaker transmission.   

## 2014-07-04 ENCOUNTER — Telehealth: Payer: Self-pay | Admitting: Cardiology

## 2014-07-04 NOTE — Telephone Encounter (Signed)
Called pt and informed her that she has reached ERI and that our scheduler will be giving her a call to talk w/ MD about procedure. Pt verbalized understanding.

## 2014-07-05 ENCOUNTER — Encounter: Payer: Self-pay | Admitting: Internal Medicine

## 2014-07-05 ENCOUNTER — Ambulatory Visit (INDEPENDENT_AMBULATORY_CARE_PROVIDER_SITE_OTHER): Payer: BC Managed Care – PPO | Admitting: Internal Medicine

## 2014-07-05 ENCOUNTER — Encounter: Payer: Self-pay | Admitting: *Deleted

## 2014-07-05 VITALS — BP 112/80 | HR 79 | Ht 64.0 in | Wt 177.0 lb

## 2014-07-05 DIAGNOSIS — Z01812 Encounter for preprocedural laboratory examination: Secondary | ICD-10-CM | POA: Diagnosis not present

## 2014-07-05 DIAGNOSIS — I442 Atrioventricular block, complete: Secondary | ICD-10-CM | POA: Diagnosis not present

## 2014-07-05 DIAGNOSIS — I10 Essential (primary) hypertension: Secondary | ICD-10-CM

## 2014-07-05 DIAGNOSIS — R079 Chest pain, unspecified: Secondary | ICD-10-CM

## 2014-07-05 LAB — MDC_IDC_ENUM_SESS_TYPE_INCLINIC
Battery Voltage: 2.81 V
Brady Statistic AP VP Percent: 5.91 %
Brady Statistic RA Percent Paced: 6 %
Lead Channel Impedance Value: 384 Ohm
Lead Channel Pacing Threshold Amplitude: 1 V
Lead Channel Pacing Threshold Pulse Width: 0.4 ms
Lead Channel Sensing Intrinsic Amplitude: 3.1173
Lead Channel Setting Pacing Amplitude: 2.5 V
Lead Channel Setting Pacing Pulse Width: 0.4 ms
MDC IDC MSMT LEADCHNL RA PACING THRESHOLD AMPLITUDE: 1 V
MDC IDC MSMT LEADCHNL RV IMPEDANCE VALUE: 544 Ohm
MDC IDC MSMT LEADCHNL RV PACING THRESHOLD PULSEWIDTH: 0.4 ms
MDC IDC MSMT LEADCHNL RV SENSING INTR AMPL: 2.729
MDC IDC SESS DTM: 20160202123445
MDC IDC SET LEADCHNL RA PACING AMPLITUDE: 2 V
MDC IDC SET LEADCHNL RV SENSING SENSITIVITY: 0.9 mV
MDC IDC SET ZONE DETECTION INTERVAL: 330 ms
MDC IDC SET ZONE DETECTION INTERVAL: 360 ms
MDC IDC STAT BRADY AP VS PERCENT: 0.09 %
MDC IDC STAT BRADY AS VP PERCENT: 93.86 %
MDC IDC STAT BRADY AS VS PERCENT: 0.15 %
MDC IDC STAT BRADY RV PERCENT PACED: 99.76 %

## 2014-07-05 NOTE — Patient Instructions (Signed)
You are scheduled to have a generator change. Please see attached letter for instructions.  Your physician recommends that you have pre procedure labs today: BMP CBC  INR

## 2014-07-05 NOTE — Progress Notes (Signed)
      Patient Care Team: Golden Pop, MD as PCP - General (Unknown Physician Specialty)   HPI  Angela [name] is a 44 y.o. 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   Because of some degree of atrial tachycardia/sinus tachycardia we have tried reprogrammed her device DDD--DDI. She did not tolerate this at all. We subsequently programmed her back with a lower upper tracking rate  She recently reached ERI; she has reverted to VVI pacing. She feels terrible.   Past Medical History  Diagnosis Date  . SVT (supraventricular tachycardia)     s/p ablation a. c/b AV nod ablation requiring pacemaker  . Chest pain     a cath 2/10. EF 50-55% normal coronaries  . Depression   . Dyspnea     CT negative for PE, 2007  . OCD (obsessive compulsive disorder)   . Hypertension   . Insomnia   . Dysautonomia     Past Surgical History  Procedure Laterality Date  . Pacemaker insertion    . Pelvic laparoscopy  1990  . Tubal ligation  2003  . Breast lumpectomy      RIGHT -BREAST   . Endometrial ablation  2005  . Spark  2005    Current Outpatient Prescriptions  Medication Sig Dispense Refill  . Linaclotide (LINZESS) 145 MCG CAPS capsule Take 145 mcg by mouth daily.    Marland Kitchen LORazepam (ATIVAN) 0.5 MG tablet Take 0.5 mg by mouth as needed for anxiety.    Marland Kitchen lubiprostone (AMITIZA) 24 MCG capsule Take 24 mcg by mouth 2 (two) times daily with a meal.    . meloxicam (MOBIC) 15 MG tablet Take 15 mg by mouth as needed.     . metoprolol (LOPRESSOR) 50 MG tablet TAKE 1 TABLET BY MOUTH TWICE DAILY. 60 tablet 0  . Multiple Vitamins-Minerals (MULTIVITAMINS THER. W/MINERALS) TABS Take 1 tablet by mouth daily.    . traZODone (DESYREL) 50 MG tablet Take 50 mg by mouth at bedtime.    Marland Kitchen venlafaxine XR (EFFEXOR-XR) 150 MG 24 hr capsule  Take 150 mg by mouth daily with breakfast.     No current facility-administered medications for this visit.    Allergies  Allergen Reactions  . Toprol Xl [Metoprolol Succinate]     Review of Systems negative except from HPI and PMH  Physical Exam BP 112/80 mmHg  Pulse 79  Ht 5\' 4"  (1.626 m)  Wt 177 lb (80.287 kg)  BMI 30.37 kg/m2 Well developed and nourished in no acute distress HENT normal Neck supple with JVP-flat Clear Regular rate and rhythm, no murmurs or gallops Abd-soft with active BS No Clubbing cyanosis edema Skin-warm and dry A & Oriented  Grossly normal sensory and motor function    Assessment and  Plan  Complete heart block  Pacemaker Medtronic  POTS  Obesity  Anxiety/depression    She feels better with reprogramiing We have reviewed the benefits and risks of generator replacement.  These include but are not limited to lead fracture and infection.  The patient understands, agrees and is willing to proceed.

## 2014-07-06 LAB — CBC WITH DIFFERENTIAL/PLATELET
Basophils Absolute: 0.1 10*3/uL (ref 0.0–0.2)
Basos: 1 %
EOS: 0 %
Eosinophils Absolute: 0 10*3/uL (ref 0.0–0.4)
HEMATOCRIT: 44 % (ref 34.0–46.6)
Hemoglobin: 14.9 g/dL (ref 11.1–15.9)
IMMATURE GRANULOCYTES: 0 %
Immature Grans (Abs): 0 10*3/uL (ref 0.0–0.1)
LYMPHS ABS: 2.1 10*3/uL (ref 0.7–3.1)
Lymphs: 31 %
MCH: 30.8 pg (ref 26.6–33.0)
MCHC: 33.9 g/dL (ref 31.5–35.7)
MCV: 91 fL (ref 79–97)
Monocytes Absolute: 0.5 10*3/uL (ref 0.1–0.9)
Monocytes: 7 %
NEUTROS ABS: 4.1 10*3/uL (ref 1.4–7.0)
Neutrophils Relative %: 61 %
Platelets: 339 10*3/uL (ref 150–379)
RBC: 4.84 x10E6/uL (ref 3.77–5.28)
RDW: 12.6 % (ref 12.3–15.4)
WBC: 6.8 10*3/uL (ref 3.4–10.8)

## 2014-07-06 LAB — BASIC METABOLIC PANEL
BUN / CREAT RATIO: 15 (ref 9–23)
BUN: 14 mg/dL (ref 6–24)
CALCIUM: 9.6 mg/dL (ref 8.7–10.2)
CO2: 19 mmol/L (ref 18–29)
Chloride: 101 mmol/L (ref 97–108)
Creatinine, Ser: 0.91 mg/dL (ref 0.57–1.00)
GFR calc non Af Amer: 78 mL/min/{1.73_m2} (ref >59)
GFR, EST AFRICAN AMERICAN: 89 mL/min/{1.73_m2} (ref >59)
Glucose: 73 mg/dL (ref 65–99)
POTASSIUM: 4.7 mmol/L (ref 3.5–5.2)
Sodium: 139 mmol/L (ref 134–144)

## 2014-07-06 LAB — PROTIME-INR
INR: 1 (ref 0.8–1.2)
PROTHROMBIN TIME: 10.5 s (ref 9.1–12.0)

## 2014-07-11 ENCOUNTER — Encounter: Payer: Self-pay | Admitting: Cardiology

## 2014-07-12 DIAGNOSIS — F329 Major depressive disorder, single episode, unspecified: Secondary | ICD-10-CM | POA: Diagnosis not present

## 2014-07-12 DIAGNOSIS — E669 Obesity, unspecified: Secondary | ICD-10-CM | POA: Diagnosis not present

## 2014-07-12 DIAGNOSIS — I442 Atrioventricular block, complete: Secondary | ICD-10-CM | POA: Diagnosis not present

## 2014-07-12 DIAGNOSIS — Z6830 Body mass index (BMI) 30.0-30.9, adult: Secondary | ICD-10-CM | POA: Diagnosis not present

## 2014-07-12 DIAGNOSIS — G4700 Insomnia, unspecified: Secondary | ICD-10-CM | POA: Diagnosis not present

## 2014-07-12 DIAGNOSIS — I10 Essential (primary) hypertension: Secondary | ICD-10-CM | POA: Diagnosis not present

## 2014-07-12 DIAGNOSIS — F42 Obsessive-compulsive disorder: Secondary | ICD-10-CM | POA: Diagnosis not present

## 2014-07-12 DIAGNOSIS — Z888 Allergy status to other drugs, medicaments and biological substances status: Secondary | ICD-10-CM | POA: Diagnosis not present

## 2014-07-12 DIAGNOSIS — Z45010 Encounter for checking and testing of cardiac pacemaker pulse generator [battery]: Secondary | ICD-10-CM | POA: Diagnosis present

## 2014-07-12 DIAGNOSIS — F419 Anxiety disorder, unspecified: Secondary | ICD-10-CM | POA: Diagnosis not present

## 2014-07-12 MED ORDER — SODIUM CHLORIDE 0.9 % IV SOLN
INTRAVENOUS | Status: DC
  Administered 2014-07-13: 1000 mL via INTRAVENOUS

## 2014-07-12 MED ORDER — CEFAZOLIN SODIUM-DEXTROSE 2-3 GM-% IV SOLR: 2.0000 g | INTRAVENOUS | Status: DC

## 2014-07-12 MED ORDER — SODIUM CHLORIDE 0.9 % IR SOLN
80.0000 mg | Status: DC
  Filled 2014-07-12: qty 2

## 2014-07-13 ENCOUNTER — Encounter (HOSPITAL_COMMUNITY)
Admission: RE | Disposition: A | Discharge status: Home / Self Care | Payer: Self-pay | Source: Ambulatory Visit | Attending: Internal Medicine

## 2014-07-13 ENCOUNTER — Ambulatory Visit (HOSPITAL_COMMUNITY)
Admission: RE | Admit: 2014-07-13 | Discharge: 2014-07-13 | Disposition: A | Discharge status: Home / Self Care | Payer: Medicare Other | Source: Ambulatory Visit | Attending: Internal Medicine | Admitting: Internal Medicine

## 2014-07-13 ENCOUNTER — Encounter (HOSPITAL_COMMUNITY): Payer: Self-pay | Admitting: Internal Medicine

## 2014-07-13 DIAGNOSIS — F329 Major depressive disorder, single episode, unspecified: Secondary | ICD-10-CM | POA: Insufficient documentation

## 2014-07-13 DIAGNOSIS — F42 Obsessive-compulsive disorder: Secondary | ICD-10-CM | POA: Insufficient documentation

## 2014-07-13 DIAGNOSIS — Z45010 Encounter for checking and testing of cardiac pacemaker pulse generator [battery]: Secondary | ICD-10-CM | POA: Diagnosis not present

## 2014-07-13 DIAGNOSIS — E669 Obesity, unspecified: Secondary | ICD-10-CM | POA: Insufficient documentation

## 2014-07-13 DIAGNOSIS — F419 Anxiety disorder, unspecified: Secondary | ICD-10-CM | POA: Insufficient documentation

## 2014-07-13 DIAGNOSIS — Z888 Allergy status to other drugs, medicaments and biological substances status: Secondary | ICD-10-CM | POA: Insufficient documentation

## 2014-07-13 DIAGNOSIS — I10 Essential (primary) hypertension: Secondary | ICD-10-CM | POA: Insufficient documentation

## 2014-07-13 DIAGNOSIS — G4700 Insomnia, unspecified: Secondary | ICD-10-CM | POA: Insufficient documentation

## 2014-07-13 DIAGNOSIS — Z01812 Encounter for preprocedural laboratory examination: Secondary | ICD-10-CM

## 2014-07-13 DIAGNOSIS — I442 Atrioventricular block, complete: Secondary | ICD-10-CM | POA: Insufficient documentation

## 2014-07-13 DIAGNOSIS — R079 Chest pain, unspecified: Secondary | ICD-10-CM

## 2014-07-13 DIAGNOSIS — Z950 Presence of cardiac pacemaker: Secondary | ICD-10-CM | POA: Diagnosis present

## 2014-07-13 DIAGNOSIS — Z6830 Body mass index (BMI) 30.0-30.9, adult: Secondary | ICD-10-CM | POA: Insufficient documentation

## 2014-07-13 HISTORY — DX: Atrioventricular block, complete: I44.2

## 2014-07-13 HISTORY — PX: PERMANENT PACEMAKER GENERATOR CHANGE: SHX6022

## 2014-07-13 LAB — SURGICAL PCR SCREEN
MRSA, PCR: NEGATIVE
Staphylococcus aureus: NEGATIVE

## 2014-07-13 SURGERY — PERMANENT PACEMAKER GENERATOR CHANGE

## 2014-07-13 MED ORDER — MIDAZOLAM HCL 5 MG/5ML IJ SOLN
INTRAMUSCULAR | Status: AC
  Filled 2014-07-13: qty 5

## 2014-07-13 MED ORDER — SODIUM CHLORIDE 0.9 % IV SOLN
INTRAVENOUS | Status: DC
Start: 2014-07-13 — End: 2014-07-13

## 2014-07-13 MED ORDER — FENTANYL CITRATE 0.05 MG/ML IJ SOLN
INTRAMUSCULAR | Status: AC
  Filled 2014-07-13: qty 2

## 2014-07-13 MED ORDER — MUPIROCIN 2 % EX OINT
TOPICAL_OINTMENT | CUTANEOUS | Status: AC
  Administered 2014-07-13: 1 via NASAL
  Filled 2014-07-13: qty 22

## 2014-07-13 MED ORDER — CHLORHEXIDINE GLUCONATE 4 % EX LIQD: 60.0000 mL | Freq: Once | CUTANEOUS | Status: DC

## 2014-07-13 MED ORDER — ACETAMINOPHEN 325 MG PO TABS
325.0000 mg | ORAL_TABLET | ORAL | Status: DC | PRN
  Filled 2014-07-13: qty 2

## 2014-07-13 MED ORDER — MUPIROCIN 2 % EX OINT
TOPICAL_OINTMENT | Freq: Two times a day (BID) | CUTANEOUS | Status: DC
  Administered 2014-07-13: 1 via NASAL

## 2014-07-13 MED ORDER — LIDOCAINE HCL (PF) 1 % IJ SOLN
INTRAMUSCULAR | Status: AC
  Filled 2014-07-13: qty 60

## 2014-07-13 MED ORDER — ONDANSETRON HCL 4 MG/2ML IJ SOLN: 4.0000 mg | Freq: Four times a day (QID) | INTRAMUSCULAR | Status: DC | PRN

## 2014-07-13 NOTE — CV Procedure (Signed)
Preoperative diagnosis  Complete heart block  Irreversible  Prev pacemaker at [facility] Postoperative diagnosis same/   Procedure: Generator replacement     Following informed consent the patient was brought to the electrophysiology laboratory in place of the fluoroscopic table in the supine position after routine prep and drape lidocaine was infiltrated in the region of the previous incision and carried down to later the device pocket using sharp dissection and electrocautery. The pocket was opened the device was freed up and was explanted.  Interrogation of the previously implanted ventricular lead Medtronic MRI compatible 5076  demonstrated an R wave of NA  millivolts., and impedance of 559 ohms, and a pacing threshold of 0.6 volts at 0.5 msec.    The previously implanted atrial lead Medtronic MRI compatible 5076 demonstrated a P-wave amplitude of 4 milllivolts and impedance of  469 ohms, and a pacing threshold of <1.0 volts at @ 0.54milliseconds.  The leads were inspected. The leads were then attached to a Medtronic MRI compatible Advisa pulse generator, serial number [id number].    The pocket was irrigated with antibiotic containing saline solution hemostasis was assured and the leads and the device were placed in the pocket. The wound was then closed in 2 layers in normal fashion.  Dermabond was applied.  EBL minimal   The patient tolerated the procedure without apparent complication.  Virl Axe

## 2014-07-13 NOTE — Interval H&P Note (Signed)
History and Physical Interval Note:  07/13/2014 7:50 AM  Angela [name]  has presented today for surgery, with the diagnosis of eol  The various methods of treatment have been discussed with the patient and family. After consideration of risks, benefits and other options for treatment, the patient has consented to  Procedure(s): PERMANENT PACEMAKER GENERATOR CHANGE (N/A) as a surgical intervention .  The patient's history has been reviewed, patient examined, no change in status, stable for surgery.  I have reviewed the patient's chart and labs.  Questions were answered to the patient's satisfaction.     Virl Axe

## 2014-07-13 NOTE — H&P (View-Only) (Signed)
      Patient Care Team: Golden Pop, MD as PCP - General (Unknown Physician Specialty)   HPI  Angela [name] is a 44 y.o. 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   Because of some degree of atrial tachycardia/sinus tachycardia we have tried reprogrammed her device DDD--DDI. She did not tolerate this at all. We subsequently programmed her back with a lower upper tracking rate  She recently reached ERI; she has reverted to VVI pacing. She feels terrible.   Past Medical History  Diagnosis Date  . SVT (supraventricular tachycardia)     s/p ablation a. c/b AV nod ablation requiring pacemaker  . Chest pain     a cath 2/10. EF 50-55% normal coronaries  . Depression   . Dyspnea     CT negative for PE, 2007  . OCD (obsessive compulsive disorder)   . Hypertension   . Insomnia   . Dysautonomia     Past Surgical History  Procedure Laterality Date  . Pacemaker insertion    . Pelvic laparoscopy  1990  . Tubal ligation  2003  . Breast lumpectomy      RIGHT -BREAST   . Endometrial ablation  2005  . Spark  2005    Current Outpatient Prescriptions  Medication Sig Dispense Refill  . Linaclotide (LINZESS) 145 MCG CAPS capsule Take 145 mcg by mouth daily.    Marland Kitchen LORazepam (ATIVAN) 0.5 MG tablet Take 0.5 mg by mouth as needed for anxiety.    Marland Kitchen lubiprostone (AMITIZA) 24 MCG capsule Take 24 mcg by mouth 2 (two) times daily with a meal.    . meloxicam (MOBIC) 15 MG tablet Take 15 mg by mouth as needed.     . metoprolol (LOPRESSOR) 50 MG tablet TAKE 1 TABLET BY MOUTH TWICE DAILY. 60 tablet 0  . Multiple Vitamins-Minerals (MULTIVITAMINS THER. W/MINERALS) TABS Take 1 tablet by mouth daily.    . traZODone (DESYREL) 50 MG tablet Take 50 mg by mouth at bedtime.    Marland Kitchen venlafaxine XR (EFFEXOR-XR) 150 MG 24 hr capsule  Take 150 mg by mouth daily with breakfast.     No current facility-administered medications for this visit.    Allergies  Allergen Reactions  . Toprol Xl [Metoprolol Succinate]     Review of Systems negative except from HPI and PMH  Physical Exam BP 112/80 mmHg  Pulse 79  Ht 5\' 4"  (1.626 m)  Wt 177 lb (80.287 kg)  BMI 30.37 kg/m2 Well developed and nourished in no acute distress HENT normal Neck supple with JVP-flat Clear Regular rate and rhythm, no murmurs or gallops Abd-soft with active BS No Clubbing cyanosis edema Skin-warm and dry A & Oriented  Grossly normal sensory and motor function    Assessment and  Plan  Complete heart block  Pacemaker Medtronic  POTS  Obesity  Anxiety/depression    She feels better with reprogramiing We have reviewed the benefits and risks of generator replacement.  These include but are not limited to lead fracture and infection.  The patient understands, agrees and is willing to proceed.

## 2014-07-13 NOTE — Discharge Instructions (Signed)
Pacemaker Battery Change, Care After °Refer to this sheet in the next few weeks. These instructions provide you with information on caring for yourself after your procedure. Your health care provider may also give you more specific instructions. Your treatment has been planned according to current medical practices, but problems sometimes occur. Call your health care provider if you have any problems or questions after your procedure. °WHAT TO EXPECT AFTER THE PROCEDURE °After your procedure, it is typical to have the following sensations: °· Soreness at the pacemaker site. °HOME CARE INSTRUCTIONS  °· Keep the incision clean and dry. °· Unless advised otherwise, you may shower beginning 48 hours after your procedure. °· For the first week after the replacement, avoid stretching motions that pull at the incision site, and avoid heavy exercise with the arm that is on the same side as the incision. °· Take medicines only as directed by your health care provider. °· Keep all follow-up visits as directed by your health care provider. °SEEK MEDICAL CARE IF:  °· You have pain at the incision site that is not relieved by over-the-counter or prescription medicine. °· There is drainage or pus from the incision site. °· There is swelling larger than a lime at the incision site. °· You develop red streaking that extends above or below the incision site. °· You feel brief, intermittent palpitations, light-headedness, or any symptoms that you feel might be related to your heart. °SEEK IMMEDIATE MEDICAL CARE IF:  °· You experience chest pain that is different than the pain at the pacemaker site. °· You experience shortness of breath. °· You have palpitations or irregular heartbeat. °· You have light-headedness that does not go away quickly. °· You faint. °· You have pain that gets worse and is not relieved by medicine. °Document Released: 03/10/2013 Document Revised: 10/04/2013 Document Reviewed: 03/10/2013 °ExitCare® Patient  Information ©2015 [employer]. This information is not intended to replace advice given to you by your health care provider. Make sure you discuss any questions you have with your health care provider. ° °

## 2014-07-14 ENCOUNTER — Encounter: Payer: Self-pay | Admitting: Internal Medicine

## 2014-07-15 ENCOUNTER — Encounter (HOSPITAL_COMMUNITY): Payer: Self-pay | Admitting: *Deleted

## 2014-07-27 ENCOUNTER — Other Ambulatory Visit: Payer: Self-pay | Admitting: Internal Medicine

## 2014-07-28 ENCOUNTER — Encounter: Payer: Self-pay | Admitting: Internal Medicine

## 2014-07-29 ENCOUNTER — Ambulatory Visit: Payer: BC Managed Care – PPO

## 2014-08-08 ENCOUNTER — Ambulatory Visit (INDEPENDENT_AMBULATORY_CARE_PROVIDER_SITE_OTHER): Payer: Medicare Other | Admitting: *Deleted

## 2014-08-08 DIAGNOSIS — I471 Supraventricular tachycardia: Secondary | ICD-10-CM | POA: Diagnosis not present

## 2014-08-08 DIAGNOSIS — I442 Atrioventricular block, complete: Secondary | ICD-10-CM | POA: Diagnosis not present

## 2014-08-08 LAB — MDC_IDC_ENUM_SESS_TYPE_INCLINIC
Battery Remaining Longevity: 164 mo
Battery Voltage: 3.13 V
Brady Statistic AS VS Percent: 0.03 %
Brady Statistic RV Percent Paced: 99.96 %
Date Time Interrogation Session: 20160307111547
Lead Channel Impedance Value: 304 Ohm
Lead Channel Impedance Value: 361 Ohm
Lead Channel Impedance Value: 475 Ohm
Lead Channel Pacing Threshold Amplitude: 0.625 V
Lead Channel Pacing Threshold Amplitude: 0.625 V
Lead Channel Pacing Threshold Pulse Width: 0.4 ms
Lead Channel Sensing Intrinsic Amplitude: 3.25 mV
Lead Channel Sensing Intrinsic Amplitude: 3.5 mV
Lead Channel Sensing Intrinsic Amplitude: 5.5 mV
Lead Channel Setting Pacing Amplitude: 2 V
Lead Channel Setting Pacing Amplitude: 2.5 V
Lead Channel Setting Pacing Pulse Width: 0.4 ms
Lead Channel Setting Sensing Sensitivity: 2 mV
MDC IDC MSMT LEADCHNL RA PACING THRESHOLD PULSEWIDTH: 0.4 ms
MDC IDC MSMT LEADCHNL RV IMPEDANCE VALUE: 418 Ohm
MDC IDC MSMT LEADCHNL RV SENSING INTR AMPL: 3.25 mV
MDC IDC SET ZONE DETECTION INTERVAL: 400 ms
MDC IDC STAT BRADY AP VP PERCENT: 10.41 %
MDC IDC STAT BRADY AP VS PERCENT: 0.01 %
MDC IDC STAT BRADY AS VP PERCENT: 89.56 %
MDC IDC STAT BRADY RA PERCENT PACED: 10.42 %
Zone Setting Detection Interval: 350 ms

## 2014-08-08 NOTE — Progress Notes (Signed)
Wound check appointment. Dressing removed prior to appt. Wound without redness or edema. Incision edges approximated, wound well healed. Normal device function. Thresholds, sensing, and impedances consistent with implant measurements. Device programmed at appropriate safety margins. Histogram distribution appropriate for patient and level of activity. 7 AT/AF episodes (<0.1%)---max dur. 6 mins, Max Avg A 173, Max Avg V 103---?AT/FFRW. No high ventricular rates noted. Patient educated about wound care, arm mobility, lifting restrictions. ROV in 3 months with SK.

## 2014-08-31 ENCOUNTER — Encounter: Payer: Self-pay | Admitting: Internal Medicine

## 2014-11-06 ENCOUNTER — Other Ambulatory Visit: Payer: Self-pay | Admitting: Family Medicine

## 2014-11-08 NOTE — Telephone Encounter (Signed)
Please get patient an appointment.

## 2014-11-09 NOTE — Telephone Encounter (Signed)
11/09/14 l/m for pt to call and schedule appt

## 2014-11-17 ENCOUNTER — Ambulatory Visit (INDEPENDENT_AMBULATORY_CARE_PROVIDER_SITE_OTHER): Payer: Medicare Other | Admitting: Internal Medicine

## 2014-11-17 ENCOUNTER — Encounter: Payer: Self-pay | Admitting: Internal Medicine

## 2014-11-17 VITALS — BP 131/91 | HR 76 | Ht 64.0 in | Wt 180.6 lb

## 2014-11-17 DIAGNOSIS — G901 Familial dysautonomia [Riley-Day]: Secondary | ICD-10-CM

## 2014-11-17 DIAGNOSIS — Z45018 Encounter for adjustment and management of other part of cardiac pacemaker: Secondary | ICD-10-CM

## 2014-11-17 DIAGNOSIS — G909 Disorder of the autonomic nervous system, unspecified: Secondary | ICD-10-CM | POA: Diagnosis not present

## 2014-11-17 DIAGNOSIS — I442 Atrioventricular block, complete: Secondary | ICD-10-CM

## 2014-11-17 LAB — CUP PACEART INCLINIC DEVICE CHECK
Battery Remaining Longevity: 126 mo
Brady Statistic AP VP Percent: 10.84 %
Brady Statistic AP VS Percent: 0 %
Brady Statistic AS VS Percent: 0.01 %
Date Time Interrogation Session: 20160616100226
Lead Channel Impedance Value: 380 Ohm
Lead Channel Impedance Value: 437 Ohm
Lead Channel Impedance Value: 513 Ohm
Lead Channel Pacing Threshold Amplitude: 0.5 V
Lead Channel Pacing Threshold Amplitude: 0.75 V
Lead Channel Pacing Threshold Pulse Width: 0.4 ms
Lead Channel Sensing Intrinsic Amplitude: 3.75 mV
Lead Channel Sensing Intrinsic Amplitude: 4.25 mV
Lead Channel Setting Pacing Amplitude: 2 V
Lead Channel Setting Pacing Amplitude: 2.5 V
Lead Channel Setting Pacing Pulse Width: 0.4 ms
Lead Channel Setting Sensing Sensitivity: 2 mV
MDC IDC MSMT BATTERY VOLTAGE: 3.05 V
MDC IDC MSMT LEADCHNL RA IMPEDANCE VALUE: 304 Ohm
MDC IDC MSMT LEADCHNL RV PACING THRESHOLD PULSEWIDTH: 0.4 ms
MDC IDC SET ZONE DETECTION INTERVAL: 400 ms
MDC IDC STAT BRADY AS VP PERCENT: 89.14 %
MDC IDC STAT BRADY RA PERCENT PACED: 10.85 %
MDC IDC STAT BRADY RV PERCENT PACED: 99.98 %
Zone Setting Detection Interval: 350 ms

## 2014-11-17 NOTE — Progress Notes (Signed)
Patient Care Team: Guadalupe Maple, MD as PCP - General (Unknown Physician Specialty)   HPI  Angela [name] is a 44 y.o. 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   Because of some degree of atrial tachycardia/sinus tachycardia we have tried reprogrammed her device DDD--DDI. She did not tolerate this at all. We subsequently programmed her back with a lower upper tracking rate  She underwent device revision surgery 2/16  having reverted to VVI and felt terrible.  She is much improved. The heat however remains a problem. Lower extremity edema is issue also.   Past Medical History  Diagnosis Date  . SVT (supraventricular tachycardia)     s/p ablation a. c/b AV nod ablation requiring pacemaker  . Chest pain     a cath 2/10. EF 50-55% normal coronaries  . Depression   . Dyspnea     CT negative for PE, 2007  . OCD (obsessive compulsive disorder)   . Hypertension   . Insomnia   . Dysautonomia   . Complete heart block     Past Surgical History  Procedure Laterality Date  . Pelvic laparoscopy  1990  . Tubal ligation  2003  . Breast lumpectomy      RIGHT -BREAST   . Endometrial ablation  2005  . Spark  2005  . Permanent pacemaker generator change N/A 07/13/2014    MDT MRI compatible dual chamber pacemaker implanted by Dr Caryl Comes    Current Outpatient Prescriptions  Medication Sig Dispense Refill  . Linaclotide (LINZESS) 145 MCG CAPS capsule Take 145 mcg by mouth daily.    Marland Kitchen LORazepam (ATIVAN) 0.5 MG tablet Take 0.5 mg by mouth as needed for anxiety.    Marland Kitchen lubiprostone (AMITIZA) 24 MCG capsule Take 24 mcg by mouth 2 (two) times daily with a meal.    . meloxicam (MOBIC) 15 MG tablet Take 15 mg by mouth as needed.     . metoprolol (LOPRESSOR) 50 MG tablet TAKE 1 TABLET BY MOUTH TWICE DAILY 60  tablet 3  . Multiple Vitamins-Minerals (MULTIVITAMINS THER. W/MINERALS) TABS Take 1 tablet by mouth daily.    . traZODone (DESYREL) 50 MG tablet TAKE 1 TABLET BY MOUTH EVERY NIGHT AT BEDTIME FOR SLEEP 30 tablet 0  . venlafaxine XR (EFFEXOR-XR) 150 MG 24 hr capsule Take 150 mg by mouth daily with breakfast.     No current facility-administered medications for this visit.    Allergies  Allergen Reactions  . Toprol Xl [Metoprolol Succinate]     Review of Systems negative except from HPI and PMH  Physical Exam BP 131/91 mmHg  Pulse 76  Ht 5\' 4"  (1.626 m)  Wt 180 lb 9.6 oz (81.92 kg)  BMI 30.98 kg/m2  SpO2 99% Well developed and nourished in no acute distress HENT normal Neck supple with JVP-flat Clear Regular rate and rhythm, no murmurs or gallops Abd-soft with active BS No Clubbing cyanosis edema Skin-warm and dry A & Oriented  Grossly normal sensory and motor function  ECG was obtained and demonstrated AV pacing  Assessment and  Plan  Complete heart block  Pacemaker Medtronic  she sleeps better than she has in years with sleep mode activated.  POTS  Obesity  Anxiety/depression    Atrial tachycardia  Elevated blood pressure  Ventricular sensed  events question mechanism  Her husband was concerned about the atrial fibrillation identified on her device. This is a definitional issue related to heart rates faster than 171. No atrial fibrillation was detected. It is more consistent with an atrial tachycardia. There were also some's episodes with rapid ventricular sensed events. In the context of her complete heart block I have to presume that the origin of these is in her ventricle  Echocardiogram 1/15 was reviewed and demonstrated normal LV function electrolytes were normal 2/16.  We will continue to monitor her blood pressure  Very exciting changes in her family with her son going to the school math and science and her daughter going to early college

## 2014-11-17 NOTE — Patient Instructions (Signed)
Medication Instructions:  Your physician recommends that you continue on your current medications as directed. Please refer to the Current Medication list given to you today.   Labwork: NONE  Testing/Procedures: NONE  Follow-Up: Remote monitoring is used to monitor your Pacemaker of ICD from home. This monitoring reduces the number of office visits required to check your device to one time per year. It allows [place] to keep an eye on the functioning of your device to ensure it is working properly. You are scheduled for a device check from home on 02/16/2015. You may send your transmission at any time that day. If you have a wireless device, the transmission will be sent automatically. After your physician reviews your transmission, you will receive a postcard with your next transmission date.  Your physician wants you to follow-up in: 9 months with Chanetta Marshall, NP. You will receive a reminder letter in the mail two months in advance. If you don't receive a letter, please call our office to schedule the follow-up appointment.     Any Other Special Instructions Will Be Listed Below (If Applicable).

## 2014-11-23 ENCOUNTER — Ambulatory Visit (INDEPENDENT_AMBULATORY_CARE_PROVIDER_SITE_OTHER): Payer: Medicare Other | Admitting: Family Medicine

## 2014-11-23 ENCOUNTER — Encounter: Payer: Self-pay | Admitting: Family Medicine

## 2014-11-23 VITALS — BP 132/81 | HR 82 | Temp 98.5°F | Ht 64.5 in | Wt 181.0 lb

## 2014-11-23 DIAGNOSIS — R000 Tachycardia, unspecified: Secondary | ICD-10-CM | POA: Diagnosis not present

## 2014-11-23 DIAGNOSIS — I951 Orthostatic hypotension: Secondary | ICD-10-CM

## 2014-11-23 DIAGNOSIS — F329 Major depressive disorder, single episode, unspecified: Secondary | ICD-10-CM

## 2014-11-23 DIAGNOSIS — G90A Postural orthostatic tachycardia syndrome (POTS): Secondary | ICD-10-CM

## 2014-11-23 DIAGNOSIS — F32A Depression, unspecified: Secondary | ICD-10-CM

## 2014-11-23 DIAGNOSIS — F339 Major depressive disorder, recurrent, unspecified: Secondary | ICD-10-CM | POA: Insufficient documentation

## 2014-11-23 MED ORDER — MELOXICAM 15 MG PO TABS: 15.0000 mg | ORAL_TABLET | ORAL | Status: DC | PRN

## 2014-11-23 MED ORDER — LUBIPROSTONE 24 MCG PO CAPS: 24.0000 ug | ORAL_CAPSULE | Freq: Two times a day (BID) | ORAL | Status: DC

## 2014-11-23 MED ORDER — VENLAFAXINE HCL ER 150 MG PO CP24: 150.0000 mg | ORAL_CAPSULE | Freq: Every day | ORAL | Status: DC

## 2014-11-23 NOTE — Progress Notes (Signed)
BP 132/81 mmHg  Pulse 82  Temp(Src) 98.5 F (36.9 C)  Ht 5' 4.5" (1.638 m)  Wt 181 lb (82.101 kg)  BMI 30.60 kg/m2  SpO2 96%  LMP  (LMP Unknown)   Subjective:    Patient ID: Angela [name], female    DOB: 07-May-1971, 44 y.o.   MRN: [id number]  HPI: Angela [name] is a 44 y.o. female  Chief Complaint  Patient presents with  . Depression  POTS having Tachardia again and card is adjusting pacemaker Depression stable Constipation on meds ok Sleep ok Maybe some wt gain side effects from trazadone   Relevant past medical, surgical, family and social history reviewed and updated as indicated. Interim medical history since our last visit reviewed. Allergies and medications reviewed and updated.  Review of Systems  Respiratory: Negative.   Cardiovascular: Negative.     Per HPI unless specifically indicated above     Objective:    BP 132/81 mmHg  Pulse 82  Temp(Src) 98.5 F (36.9 C)  Ht 5' 4.5" (1.638 m)  Wt 181 lb (82.101 kg)  BMI 30.60 kg/m2  SpO2 96%  LMP  (LMP Unknown)  Wt Readings from Last 3 Encounters:  11/23/14 181 lb (82.101 kg)  11/17/14 180 lb 9.6 oz (81.92 kg)  07/13/14 170 lb (77.111 kg)    Physical Exam  Constitutional: She is oriented to person, place, and time. She appears well-developed and well-nourished. No distress.  HENT:  Head: Normocephalic and atraumatic.  Right Ear: Hearing normal.  Left Ear: Hearing normal.  Nose: Nose normal.  Eyes: Conjunctivae and lids are normal. Right eye exhibits no discharge. Left eye exhibits no discharge. No scleral icterus.  Cardiovascular: Normal rate, regular rhythm and normal heart sounds.   Pulmonary/Chest: Effort normal and breath sounds normal. No respiratory distress.  Musculoskeletal: Normal range of motion.  Neurological: She is alert and oriented to person, place, and time.  Skin: Skin is intact. No rash noted.  Psychiatric: She has a normal mood and affect. Her speech is normal and  behavior is normal. Judgment and thought content normal. Cognition and memory are normal.    Results for orders placed or performed in visit on 11/17/14  Implantable device check  Result Value Ref Range   Date Time Interrogation Session [id number]    Pulse Generator Manufacturer MERM    Pulse Gen Model [id number] Advisa DR MRI    Pulse Gen Serial Number [id number]    Implantable Pulse Generator Type Implantable Pulse Generator    Implantable Pulse Generator Implant Date [id number]    Lead Channel Setting Sensing Sensitivity 2 mV   Lead Channel Setting Pacing Amplitude 2 V   Lead Channel Setting Pacing Pulse Width 0.4 ms   Lead Channel Setting Pacing Amplitude 2.5 V   Zone Setting Type Category VF    Zone Setting Type Category VT    Zone Setting Type Category VENTRICULAR_TACHYCARDIA_1    Zone Setting Type Category VENTRICULAR_TACHYCARDIA_2    Zone Setting Detection Interval 400 ms   Zone Setting Type Category ATRIAL_FIBRILLATION    Zone Setting Type Category ATAF    Zone Setting Detection Interval 350 ms   Lead Channel Impedance Value 380 ohm   Lead Channel Impedance Value 304 ohm   Lead Channel Sensing Intrinsic Amplitude 3.75 mV   Lead Channel Pacing Threshold Amplitude 0.50 V   Lead Channel Pacing Threshold Pulse Width 0.4 ms   Lead Channel Impedance Value 513 ohm   Lead Channel Impedance Value 437  ohm   Lead Channel Sensing Intrinsic Amplitude 4.25 mV   Lead Channel Pacing Threshold Amplitude 0.75 V   Lead Channel Pacing Threshold Pulse Width 0.4 ms   Battery Status OK    Battery Remaining Longevity 126 mo   Battery Voltage 3.05 V   Brady Statistic RA Percent Paced 10.85 %   Brady Statistic RV Percent Paced 99.98 %   Brady Statistic AP VP Percent 10.84 %   Brady Statistic AS VP Percent 89.14 %   Brady Statistic AP VS Percent 0 %   Brady Statistic AS VS Percent 0.01 %   Eval Rhythm Sinus w/HB 45       Assessment & Plan:   Problem List Items Addressed This  Visit      Cardiovascular and Mediastinum   POTS (postural orthostatic tachycardia syndrome) - Primary    Cardiology conditions managed by cardiology and stable for now.        Other   Depression    The current medical regimen is effective;  continue present plan and medications.       Relevant Medications   venlafaxine XR (EFFEXOR-XR) 150 MG 24 hr capsule       Follow up plan: Return for Physical Exam this summer.

## 2014-11-23 NOTE — Assessment & Plan Note (Signed)
Cardiology conditions managed by cardiology and stable for now.

## 2014-11-23 NOTE — Assessment & Plan Note (Signed)
The current medical regimen is effective;  continue present plan and medications.  

## 2014-11-30 DIAGNOSIS — M542 Cervicalgia: Secondary | ICD-10-CM | POA: Diagnosis not present

## 2014-11-30 DIAGNOSIS — M47812 Spondylosis without myelopathy or radiculopathy, cervical region: Secondary | ICD-10-CM | POA: Diagnosis not present

## 2014-12-09 ENCOUNTER — Encounter: Payer: Self-pay | Admitting: Internal Medicine

## 2014-12-09 DIAGNOSIS — M47812 Spondylosis without myelopathy or radiculopathy, cervical region: Secondary | ICD-10-CM | POA: Diagnosis not present

## 2014-12-12 DIAGNOSIS — M47812 Spondylosis without myelopathy or radiculopathy, cervical region: Secondary | ICD-10-CM | POA: Diagnosis not present

## 2014-12-16 DIAGNOSIS — M47812 Spondylosis without myelopathy or radiculopathy, cervical region: Secondary | ICD-10-CM | POA: Diagnosis not present

## 2014-12-29 DIAGNOSIS — M47812 Spondylosis without myelopathy or radiculopathy, cervical region: Secondary | ICD-10-CM | POA: Diagnosis not present

## 2015-01-01 ENCOUNTER — Other Ambulatory Visit: Payer: Self-pay | Admitting: Internal Medicine

## 2015-01-02 DIAGNOSIS — M47812 Spondylosis without myelopathy or radiculopathy, cervical region: Secondary | ICD-10-CM | POA: Diagnosis not present

## 2015-01-10 DIAGNOSIS — M47812 Spondylosis without myelopathy or radiculopathy, cervical region: Secondary | ICD-10-CM | POA: Diagnosis not present

## 2015-01-12 DIAGNOSIS — M47812 Spondylosis without myelopathy or radiculopathy, cervical region: Secondary | ICD-10-CM | POA: Diagnosis not present

## 2015-01-17 DIAGNOSIS — M47812 Spondylosis without myelopathy or radiculopathy, cervical region: Secondary | ICD-10-CM | POA: Diagnosis not present

## 2015-01-19 ENCOUNTER — Ambulatory Visit (INDEPENDENT_AMBULATORY_CARE_PROVIDER_SITE_OTHER): Payer: Medicare Other | Admitting: Family Medicine

## 2015-01-19 ENCOUNTER — Telehealth: Payer: Self-pay | Admitting: Family Medicine

## 2015-01-19 ENCOUNTER — Encounter: Payer: Self-pay | Admitting: Family Medicine

## 2015-01-19 VITALS — BP 121/83 | HR 76 | Temp 98.2°F | Ht 64.5 in | Wt 180.0 lb

## 2015-01-19 DIAGNOSIS — I471 Supraventricular tachycardia: Secondary | ICD-10-CM | POA: Diagnosis not present

## 2015-01-19 DIAGNOSIS — I951 Orthostatic hypotension: Secondary | ICD-10-CM

## 2015-01-19 DIAGNOSIS — Z0000 Encounter for general adult medical examination without abnormal findings: Secondary | ICD-10-CM | POA: Diagnosis not present

## 2015-01-19 DIAGNOSIS — M47812 Spondylosis without myelopathy or radiculopathy, cervical region: Secondary | ICD-10-CM | POA: Diagnosis not present

## 2015-01-19 DIAGNOSIS — R000 Tachycardia, unspecified: Secondary | ICD-10-CM

## 2015-01-19 DIAGNOSIS — I442 Atrioventricular block, complete: Secondary | ICD-10-CM | POA: Diagnosis not present

## 2015-01-19 DIAGNOSIS — F32A Depression, unspecified: Secondary | ICD-10-CM

## 2015-01-19 DIAGNOSIS — G90A Postural orthostatic tachycardia syndrome (POTS): Secondary | ICD-10-CM

## 2015-01-19 DIAGNOSIS — Q078 Other specified congenital malformations of nervous system: Secondary | ICD-10-CM | POA: Diagnosis not present

## 2015-01-19 DIAGNOSIS — F329 Major depressive disorder, single episode, unspecified: Secondary | ICD-10-CM | POA: Diagnosis not present

## 2015-01-19 LAB — URINALYSIS, ROUTINE W REFLEX MICROSCOPIC
Bilirubin, UA: NEGATIVE
Glucose, UA: NEGATIVE
KETONES UA: NEGATIVE
LEUKOCYTES UA: NEGATIVE
Nitrite, UA: NEGATIVE
Protein, UA: NEGATIVE
RBC, UA: NEGATIVE
Specific Gravity, UA: 1.02 (ref 1.005–1.030)
Urobilinogen, Ur: 0.2 mg/dL (ref 0.2–1.0)
pH, UA: 8.5 — ABNORMAL HIGH (ref 5.0–7.5)

## 2015-01-19 MED ORDER — METOPROLOL TARTRATE 50 MG PO TABS: 50.0000 mg | ORAL_TABLET | Freq: Two times a day (BID) | ORAL | Status: DC

## 2015-01-19 MED ORDER — LINACLOTIDE 145 MCG PO CAPS: 145.0000 ug | ORAL_CAPSULE | Freq: Every day | ORAL | Status: DC

## 2015-01-19 MED ORDER — VENLAFAXINE HCL ER 150 MG PO CP24: 150.0000 mg | ORAL_CAPSULE | Freq: Every day | ORAL | Status: DC

## 2015-01-19 MED ORDER — LORAZEPAM 0.5 MG PO TABS: 0.5000 mg | ORAL_TABLET | ORAL | Status: DC | PRN

## 2015-01-19 MED ORDER — LUBIPROSTONE 24 MCG PO CAPS: 24.0000 ug | ORAL_CAPSULE | Freq: Two times a day (BID) | ORAL | Status: DC

## 2015-01-19 MED ORDER — MELOXICAM 15 MG PO TABS: 15.0000 mg | ORAL_TABLET | ORAL | Status: DC | PRN

## 2015-01-19 NOTE — Telephone Encounter (Signed)
Yes patient takes both drugs from time to time but not together

## 2015-01-19 NOTE — Progress Notes (Signed)
BP 121/83 mmHg  Pulse 76  Temp(Src) 98.2 F (36.8 C)  Ht 5' 4.5" (1.638 m)  Wt 180 lb (81.647 kg)  BMI 30.43 kg/m2   Subjective:    Patient ID: Angela [name], female    DOB: 1971-04-27, 44 y.o.   MRN: [id number]  HPI: Angela [name] is a 44 y.o. female  Chief Complaint  Patient presents with  . Annual Exam   Patient with left axillary node enlargement may be a little larger. It has been there for over a year no pain or other associated symptoms. 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 Patient also working with orthopedics looking at her knees and spine. POTS is stable for now heat makes it difficult to control body temperature. With adjustments in pacemaker patient no longer using trazodone and sleeping well. Patient's chronic constipation under control with amitiza may use an occasional linzess  Relevant past medical, surgical, family and social history reviewed and updated as indicated. Interim medical history since our last visit reviewed. Allergies and medications reviewed and updated.  Review of Systems  Constitutional: Negative.   HENT: Negative.   Eyes: Negative.   Respiratory: Negative.   Cardiovascular: Negative.   Gastrointestinal: Negative.   Endocrine: Negative.   Genitourinary: Negative.   Musculoskeletal: Negative.   Skin: Negative.   Allergic/Immunologic: Negative.   Neurological: Negative.   Hematological: Negative.   Psychiatric/Behavioral: Negative.     Per HPI unless specifically indicated above     Objective:    BP 121/83 mmHg  Pulse 76  Temp(Src) 98.2 F (36.8 C)  Ht 5' 4.5" (1.638 m)  Wt 180 lb (81.647 kg)  BMI 30.43 kg/m2  Wt Readings from Last 3 Encounters:  01/19/15 180 lb (81.647 kg)  11/23/14 181 lb (82.101 kg)  11/17/14 180 lb 9.6 oz (81.92 kg)    Physical Exam  Constitutional: She is oriented to person, place, and time. She appears  well-developed and well-nourished.  HENT:  Head: Normocephalic and atraumatic.  Right Ear: External ear normal.  Left Ear: External ear normal.  Nose: Nose normal.  Mouth/Throat: Oropharynx is clear and moist.  Eyes: Conjunctivae and EOM are normal. Pupils are equal, round, and reactive to light.  Neck: Normal range of motion. Neck supple. Carotid bruit is not present.  Cardiovascular: Normal rate, regular rhythm and normal heart sounds.   No murmur heard. Pulmonary/Chest: Effort normal and breath sounds normal.  Abdominal: Soft. Bowel sounds are normal. There is no hepatosplenomegaly.  Musculoskeletal: Normal range of motion.  Neurological: She is alert and oriented to person, place, and time.  Skin: No rash noted.  Left axilla with small lipoma nontender  Psychiatric: She has a normal mood and affect. Her behavior is normal. Judgment and thought content normal.        Assessment & Plan:   Problem List Items Addressed This Visit      Cardiovascular and Mediastinum   POTS (postural orthostatic tachycardia syndrome)    The current medical regimen is effective;  continue present plan and medications.       Relevant Medications   Linaclotide (LINZESS) 145 MCG CAPS capsule   lubiprostone (AMITIZA) 24 MCG capsule   metoprolol (LOPRESSOR) 50 MG tablet     Other   Depression - Primary    The current medical regimen is effective;  continue present plan and medications.       Relevant Medications  LORazepam (ATIVAN) 0.5 MG tablet   venlafaxine XR (EFFEXOR-XR) 150 MG 24 hr capsule    Other Visit Diagnoses    PE (physical exam), annual        Relevant Orders    Comprehensive metabolic panel    Lipid panel    CBC with Differential/Platelet    TSH    Urinalysis, Routine w reflex microscopic (not at [facility])        Follow up plan: Return in about 6 months (around 07/22/2015) for f/u.

## 2015-01-19 NOTE — Assessment & Plan Note (Signed)
Patient stable for now obstipation issues stable patient's current regimen

## 2015-01-19 NOTE — Telephone Encounter (Signed)
Pharmacy notified.

## 2015-01-19 NOTE — Assessment & Plan Note (Signed)
The current medical regimen is effective;  continue present plan and medications.  

## 2015-01-19 NOTE — Telephone Encounter (Signed)
[facility] called stated an RX for both amitiza and linzess were sent over to the pharmacy. Pharmacy wants to verify that both should be filled. Please call pharmacy as soon as possible.

## 2015-01-19 NOTE — Progress Notes (Signed)
Not my patient

## 2015-01-20 LAB — COMPREHENSIVE METABOLIC PANEL
ALT: 13 IU/L (ref 0–32)
AST: 18 IU/L (ref 0–40)
Albumin/Globulin Ratio: 2.3 (ref 1.1–2.5)
Albumin: 4.6 g/dL (ref 3.5–5.5)
Alkaline Phosphatase: 87 IU/L (ref 39–117)
BUN/Creatinine Ratio: 12 (ref 9–23)
BUN: 10 mg/dL (ref 6–24)
Bilirubin Total: 0.5 mg/dL (ref 0.0–1.2)
CO2: 24 mmol/L (ref 18–29)
CREATININE: 0.82 mg/dL (ref 0.57–1.00)
Calcium: 10.1 mg/dL (ref 8.7–10.2)
Chloride: 100 mmol/L (ref 97–108)
GFR calc Af Amer: 101 mL/min/{1.73_m2} (ref >59)
GFR calc non Af Amer: 88 mL/min/{1.73_m2} (ref >59)
Globulin, Total: 2 g/dL (ref 1.5–4.5)
Glucose: 84 mg/dL (ref 65–99)
Potassium: 4.6 mmol/L (ref 3.5–5.2)
Sodium: 138 mmol/L (ref 134–144)
Total Protein: 6.6 g/dL (ref 6.0–8.5)

## 2015-01-20 LAB — CBC WITH DIFFERENTIAL/PLATELET
BASOS ABS: 0 10*3/uL (ref 0.0–0.2)
BASOS: 1 %
EOS (ABSOLUTE): 0.1 10*3/uL (ref 0.0–0.4)
Eos: 1 %
HEMOGLOBIN: 14.6 g/dL (ref 11.1–15.9)
Hematocrit: 42.5 % (ref 34.0–46.6)
Immature Grans (Abs): 0 10*3/uL (ref 0.0–0.1)
Immature Granulocytes: 0 %
LYMPHS ABS: 2.5 10*3/uL (ref 0.7–3.1)
Lymphs: 29 %
MCH: 31.7 pg (ref 26.6–33.0)
MCHC: 34.4 g/dL (ref 31.5–35.7)
MCV: 92 fL (ref 79–97)
MONOCYTES: 7 %
Monocytes Absolute: 0.6 10*3/uL (ref 0.1–0.9)
NEUTROS ABS: 5.4 10*3/uL (ref 1.4–7.0)
Neutrophils: 62 %
Platelets: 340 10*3/uL (ref 150–379)
RBC: 4.61 x10E6/uL (ref 3.77–5.28)
RDW: 12.8 % (ref 12.3–15.4)
WBC: 8.6 10*3/uL (ref 3.4–10.8)

## 2015-01-20 LAB — LIPID PANEL
Chol/HDL Ratio: 3.3 ratio units (ref 0.0–4.4)
Cholesterol, Total: 180 mg/dL (ref 100–199)
HDL: 55 mg/dL (ref >39)
LDL CALC: 99 mg/dL (ref 0–99)
TRIGLYCERIDES: 132 mg/dL (ref 0–149)
VLDL Cholesterol Cal: 26 mg/dL (ref 5–40)

## 2015-01-20 LAB — TSH: TSH: 1.02 u[IU]/mL (ref 0.450–4.500)

## 2015-01-23 ENCOUNTER — Encounter: Payer: Self-pay | Admitting: Family Medicine

## 2015-01-24 DIAGNOSIS — M47812 Spondylosis without myelopathy or radiculopathy, cervical region: Secondary | ICD-10-CM | POA: Diagnosis not present

## 2015-01-31 DIAGNOSIS — M47812 Spondylosis without myelopathy or radiculopathy, cervical region: Secondary | ICD-10-CM | POA: Diagnosis not present

## 2015-02-08 DIAGNOSIS — M47812 Spondylosis without myelopathy or radiculopathy, cervical region: Secondary | ICD-10-CM | POA: Diagnosis not present

## 2015-02-16 ENCOUNTER — Ambulatory Visit (INDEPENDENT_AMBULATORY_CARE_PROVIDER_SITE_OTHER): Payer: Medicare Other | Admitting: *Deleted

## 2015-02-16 DIAGNOSIS — I442 Atrioventricular block, complete: Secondary | ICD-10-CM

## 2015-02-16 NOTE — Progress Notes (Signed)
Remote pacemaker transmission.   

## 2015-02-24 ENCOUNTER — Encounter: Payer: Self-pay | Admitting: Internal Medicine

## 2015-02-24 NOTE — Telephone Encounter (Signed)
Spoke to patient regarding recent ppm remote. I informed patient of the episodes that were recorded on that transmission. I informed her that her leads were stable.   I asked her to send in a remote today, since she stated that she had an episode of CP last night that woke her up from her sleep. Patient voiced understanding and plans to send around 1600. Will call patient after transmission is received.

## 2015-02-24 NOTE — Telephone Encounter (Signed)
Remote received. Patient did not have any episodes overnight when the CP occurred. Patient has had some AT and possible atrial oversensing along with (1) NSVT from 02/02/2015 x  17 bts @ 63/158---no recent stress test.   Patient states that the CP woke her from her sleep and lasted a significant amount of time. Patient states that she took an extra metoprolol which relieved her sx's. She states that when the CP occurs she does notice some radiation to shoulder and back. Pain is the same with movements and while at rest. She believes the CP is stress related.   I asked her to go to the ER if she experiences anymore CP. Patient voiced understanding. Will forward to SK and notify pt of any further recommedati

## 2015-03-02 LAB — CUP PACEART REMOTE DEVICE CHECK
Battery Voltage: 3.03 V
Brady Statistic AP VP Percent: 8.45 %
Brady Statistic AP VS Percent: 0.02 %
Brady Statistic AS VP Percent: 91.49 %
Brady Statistic RA Percent Paced: 8.46 %
Brady Statistic RV Percent Paced: 99.93 %
Date Time Interrogation Session: 20160915112056
Lead Channel Impedance Value: 304 Ohm
Lead Channel Impedance Value: 399 Ohm
Lead Channel Impedance Value: 475 Ohm
Lead Channel Pacing Threshold Amplitude: 0.625 V
Lead Channel Pacing Threshold Pulse Width: 0.4 ms
Lead Channel Sensing Intrinsic Amplitude: 3.875 mV
Lead Channel Sensing Intrinsic Amplitude: 3.875 mV
Lead Channel Setting Pacing Amplitude: 2 V
Lead Channel Setting Pacing Amplitude: 2.5 V
Lead Channel Setting Pacing Pulse Width: 0.4 ms
Lead Channel Setting Sensing Sensitivity: 2 mV
MDC IDC MSMT BATTERY REMAINING LONGEVITY: 117 mo
MDC IDC MSMT LEADCHNL RA IMPEDANCE VALUE: 361 Ohm
MDC IDC MSMT LEADCHNL RA SENSING INTR AMPL: 3.875 mV
MDC IDC MSMT LEADCHNL RA SENSING INTR AMPL: 3.875 mV
MDC IDC MSMT LEADCHNL RV PACING THRESHOLD AMPLITUDE: 0.625 V
MDC IDC MSMT LEADCHNL RV PACING THRESHOLD PULSEWIDTH: 0.4 ms
MDC IDC STAT BRADY AS VS PERCENT: 0.05 %
Zone Setting Detection Interval: 350 ms
Zone Setting Detection Interval: 400 ms

## 2015-03-14 ENCOUNTER — Encounter: Payer: Self-pay | Admitting: Cardiology

## 2015-03-21 ENCOUNTER — Other Ambulatory Visit: Payer: Self-pay | Admitting: Family Medicine

## 2015-03-21 DIAGNOSIS — Z1231 Encounter for screening mammogram for malignant neoplasm of breast: Secondary | ICD-10-CM

## 2015-03-24 ENCOUNTER — Ambulatory Visit
Admission: RE | Admit: 2015-03-24 | Discharge: 2015-03-24 | Disposition: A | Discharge status: Home / Self Care | Payer: Medicare Other | Source: Ambulatory Visit | Attending: Family Medicine | Admitting: Family Medicine

## 2015-03-24 DIAGNOSIS — Z1231 Encounter for screening mammogram for malignant neoplasm of breast: Secondary | ICD-10-CM

## 2015-03-24 IMAGING — MG MM DIGITAL SCREENING BILAT W/ CAD
7 series · 7 of 7 positions shown · non-contrast
Comparison: Previous exam(s).

CLINICAL DATA: Screening.

EXAM:
DIGITAL SCREENING BILATERAL MAMMOGRAM WITH CAD

[L MLO]
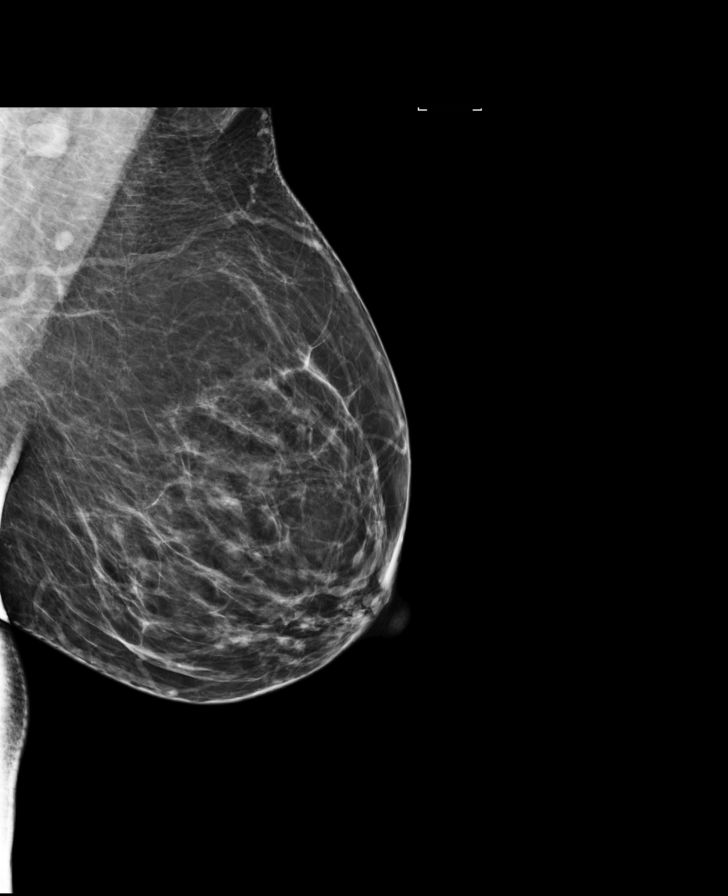

[R MLO (1 of 3)]
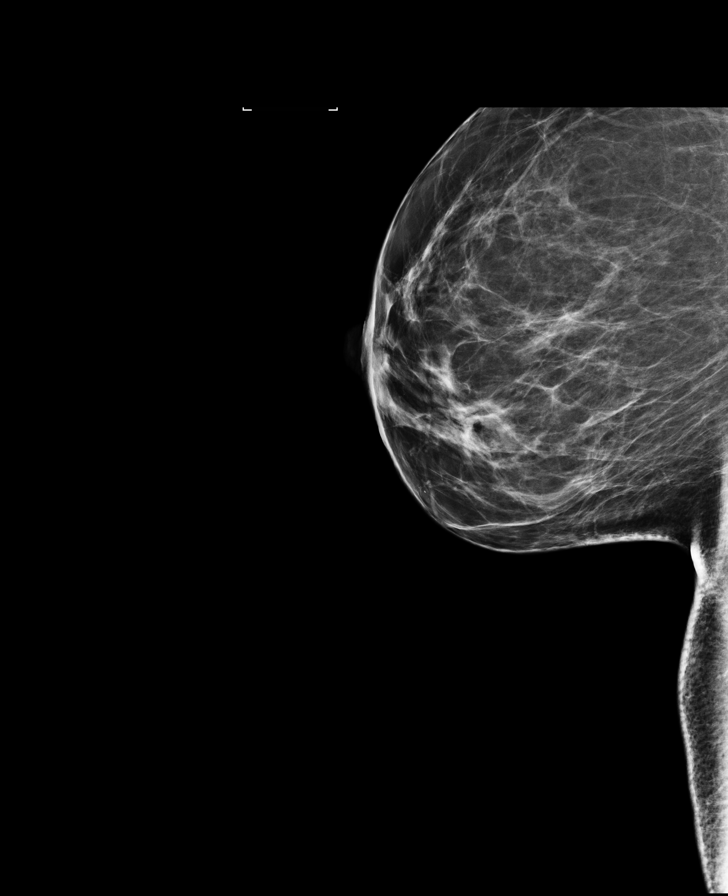

[L CC (1 of 2)]
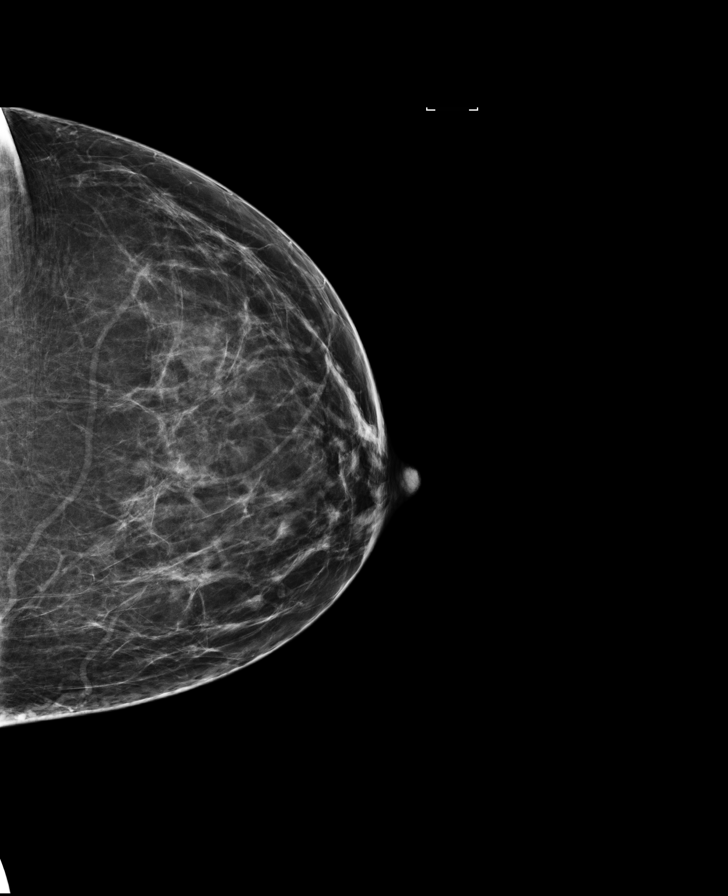

[R MLO (2 of 3)]
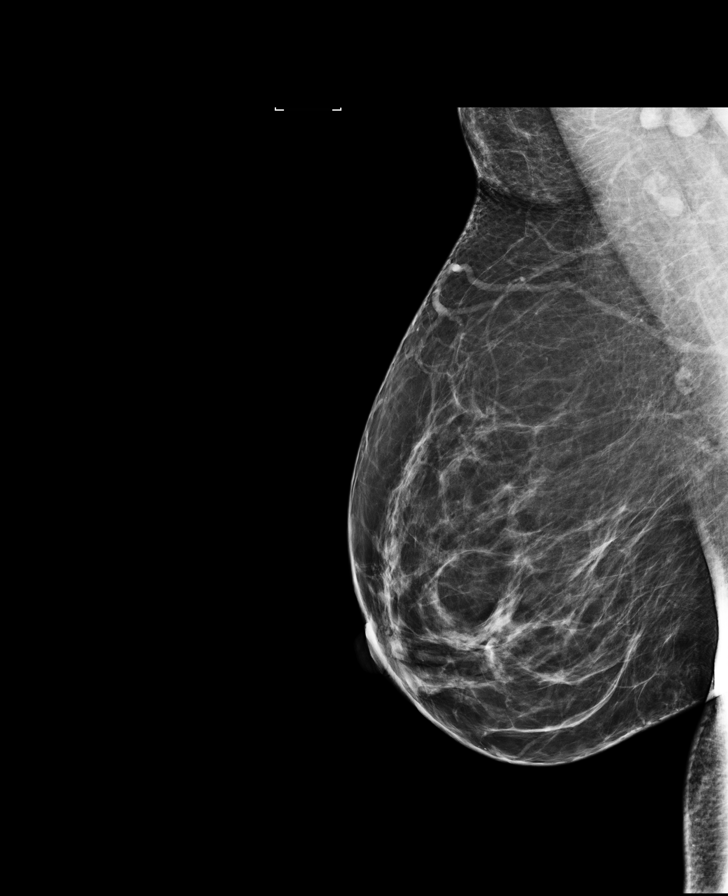

[L CC (2 of 2)]
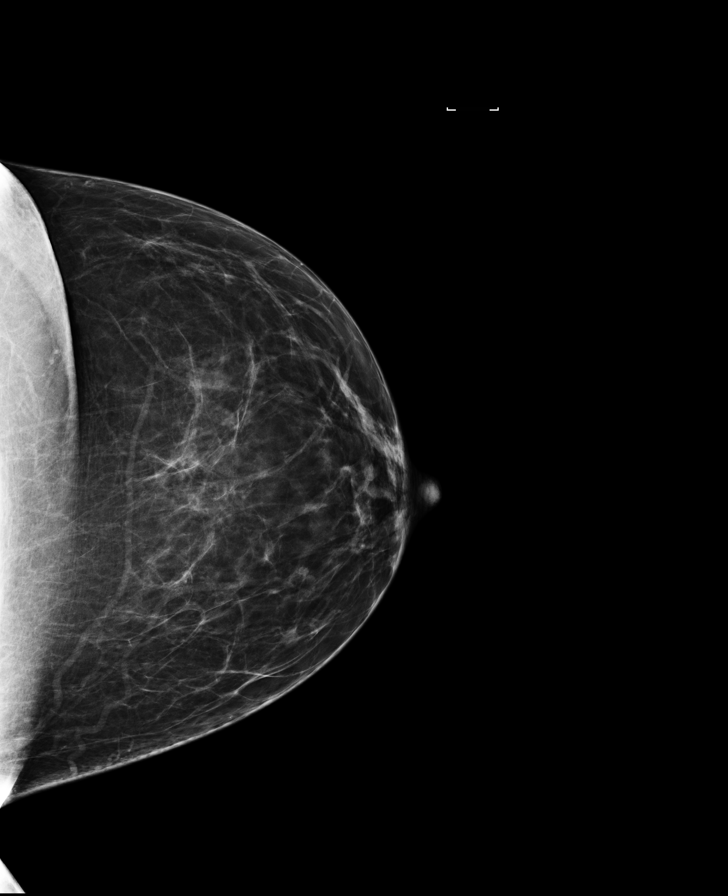

[R CC]
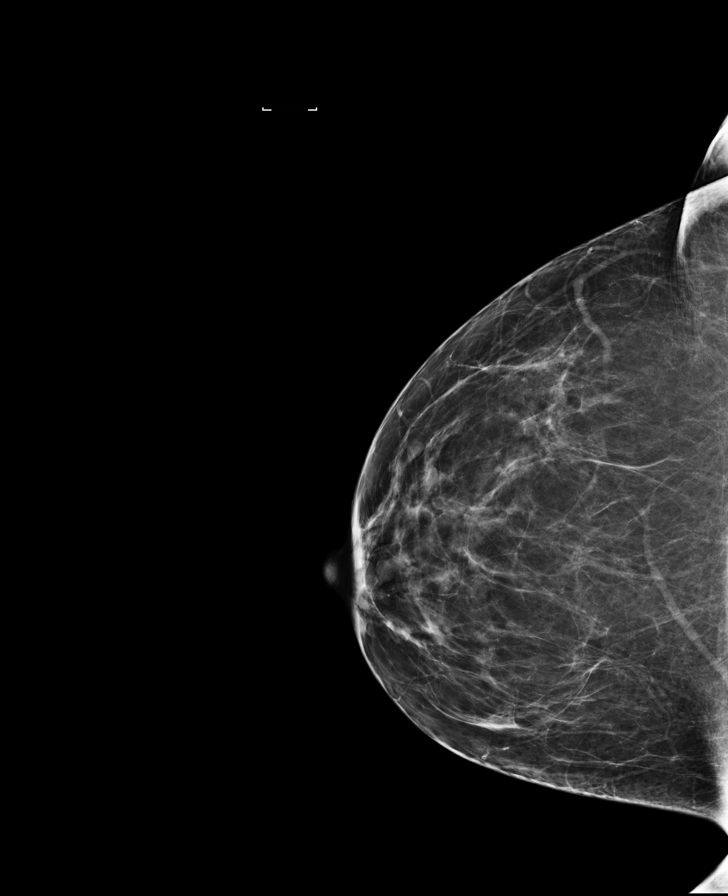

[R MLO (3 of 3)]
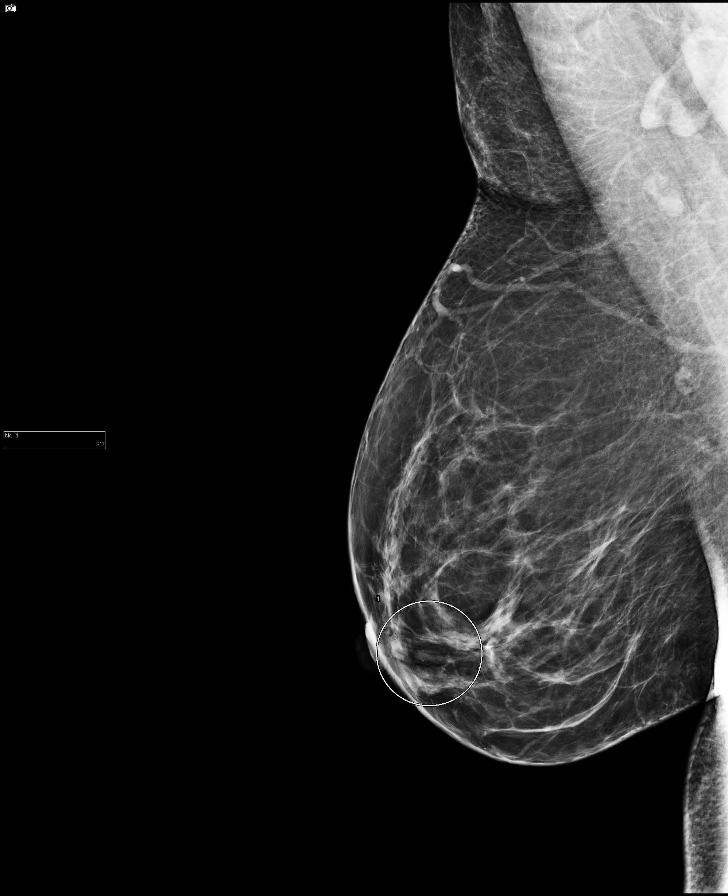

[7 of 7 positions shown; findings below may reference images not displayed]

ACR Breast Density Category b: There are scattered areas of
fibroglandular density.
FINDINGS: There are no findings suspicious for malignancy. Images were
processed with CAD.
IMPRESSION: No mammographic evidence of malignancy. A result letter of this
screening mammogram will be mailed directly to the patient.

RECOMMENDATION:
Screening mammogram in one year. (Code:[US])

BI-RADS CATEGORY  1: Negative.

## 2015-03-28 ENCOUNTER — Encounter: Payer: Self-pay | Admitting: Internal Medicine

## 2015-05-22 ENCOUNTER — Ambulatory Visit (INDEPENDENT_AMBULATORY_CARE_PROVIDER_SITE_OTHER): Payer: Medicare Other | Admitting: *Deleted

## 2015-05-22 ENCOUNTER — Telehealth: Payer: Self-pay | Admitting: Cardiology

## 2015-05-22 DIAGNOSIS — I442 Atrioventricular block, complete: Secondary | ICD-10-CM

## 2015-05-22 NOTE — Progress Notes (Signed)
Remote pacemaker transmission.   

## 2015-05-22 NOTE — Telephone Encounter (Signed)
Spoke with pt and reminded pt of remote transmission that is due today. Pt verbalized understanding.   

## 2015-05-24 LAB — CUP PACEART REMOTE DEVICE CHECK
Battery Remaining Longevity: 112 mo
Brady Statistic AP VS Percent: 0.05 %
Brady Statistic AS VS Percent: 0.16 %
Brady Statistic RV Percent Paced: 99.79 %
Implantable Lead Implant Date: 20070319
Implantable Lead Location: 753860
Implantable Lead Model: 5076
Lead Channel Impedance Value: 304 Ohm
Lead Channel Impedance Value: 475 Ohm
Lead Channel Pacing Threshold Pulse Width: 0.4 ms
Lead Channel Sensing Intrinsic Amplitude: 3.875 mV
Lead Channel Sensing Intrinsic Amplitude: 3.875 mV
Lead Channel Sensing Intrinsic Amplitude: 4 mV
Lead Channel Setting Pacing Amplitude: 2.5 V
Lead Channel Setting Sensing Sensitivity: 2 mV
MDC IDC LEAD IMPLANT DT: 20070319
MDC IDC LEAD LOCATION: 753859
MDC IDC MSMT BATTERY VOLTAGE: 3.02 V
MDC IDC MSMT LEADCHNL RA IMPEDANCE VALUE: 380 Ohm
MDC IDC MSMT LEADCHNL RA PACING THRESHOLD AMPLITUDE: 0.625 V
MDC IDC MSMT LEADCHNL RA PACING THRESHOLD PULSEWIDTH: 0.4 ms
MDC IDC MSMT LEADCHNL RV IMPEDANCE VALUE: 399 Ohm
MDC IDC MSMT LEADCHNL RV PACING THRESHOLD AMPLITUDE: 0.625 V
MDC IDC MSMT LEADCHNL RV SENSING INTR AMPL: 4 mV
MDC IDC SESS DTM: 20161219130234
MDC IDC SET LEADCHNL RA PACING AMPLITUDE: 2 V
MDC IDC SET LEADCHNL RV PACING PULSEWIDTH: 0.4 ms
MDC IDC STAT BRADY AP VP PERCENT: 12.34 %
MDC IDC STAT BRADY AS VP PERCENT: 87.45 %
MDC IDC STAT BRADY RA PERCENT PACED: 12.39 %

## 2015-05-25 ENCOUNTER — Encounter: Payer: Self-pay | Admitting: Cardiology

## 2015-07-27 ENCOUNTER — Ambulatory Visit (INDEPENDENT_AMBULATORY_CARE_PROVIDER_SITE_OTHER): Payer: Medicare Other | Admitting: Family Medicine

## 2015-07-27 ENCOUNTER — Encounter: Payer: Self-pay | Admitting: Family Medicine

## 2015-07-27 VITALS — BP 122/79 | HR 66 | Temp 97.9°F | Ht 64.6 in | Wt 185.0 lb

## 2015-07-27 DIAGNOSIS — I4891 Unspecified atrial fibrillation: Secondary | ICD-10-CM

## 2015-07-27 DIAGNOSIS — I951 Orthostatic hypotension: Secondary | ICD-10-CM | POA: Diagnosis not present

## 2015-07-27 DIAGNOSIS — R000 Tachycardia, unspecified: Secondary | ICD-10-CM | POA: Diagnosis not present

## 2015-07-27 DIAGNOSIS — F32A Depression, unspecified: Secondary | ICD-10-CM

## 2015-07-27 DIAGNOSIS — F329 Major depressive disorder, single episode, unspecified: Secondary | ICD-10-CM | POA: Diagnosis not present

## 2015-07-27 DIAGNOSIS — G90A Postural orthostatic tachycardia syndrome (POTS): Secondary | ICD-10-CM

## 2015-07-27 NOTE — Assessment & Plan Note (Signed)
The current medical regimen is effective;  continue present plan and medications.  

## 2015-07-27 NOTE — Progress Notes (Signed)
   BP 122/79 mmHg  Pulse 66  Temp(Src) 97.9 F (36.6 C)  Ht 5' 4.6" (1.641 m)  Wt 185 lb (83.915 kg)  BMI 31.16 kg/m2  SpO2 99%   Subjective:    Patient ID: Angela [name], female    DOB: 16-Jul-1970, 45 y.o.   MRN: [id number]  HPI: Angela [name] is a 45 y.o. female  Chief Complaint  Patient presents with  . Depression  . paperwork   Patient follow-up depression doing really well energy is better stresses better and sleep is better patient's exercising on a regular basis. Of concern is atrial fibrillation is showing him more frequently on her pacemaker queries has appointment with cardiology next week. No problems with medications takes only intermittently lorazepam Blood pressures been stable with no syncopal spells Relevant past medical, surgical, family and social history reviewed and updated as indicated. Interim medical history since our last visit reviewed. Allergies and medications reviewed and updated.  Review of Systems  Constitutional: Negative.   Respiratory: Negative.   Cardiovascular: Negative.     Per HPI unless specifically indicated above     Objective:    BP 122/79 mmHg  Pulse 66  Temp(Src) 97.9 F (36.6 C)  Ht 5' 4.6" (1.641 m)  Wt 185 lb (83.915 kg)  BMI 31.16 kg/m2  SpO2 99%  Wt Readings from Last 3 Encounters:  07/27/15 185 lb (83.915 kg)  01/19/15 180 lb (81.647 kg)  11/23/14 181 lb (82.101 kg)    Physical Exam  Constitutional: She is oriented to person, place, and time. She appears well-developed and well-nourished. No distress.  HENT:  Head: Normocephalic and atraumatic.  Right Ear: Hearing normal.  Left Ear: Hearing normal.  Nose: Nose normal.  Eyes: Conjunctivae and lids are normal. Right eye exhibits no discharge. Left eye exhibits no discharge. No scleral icterus.  Cardiovascular: Normal rate, regular rhythm and normal heart sounds.   Pulmonary/Chest: Effort normal and breath sounds normal. No respiratory distress.   Musculoskeletal: Normal range of motion.  Neurological: She is alert and oriented to person, place, and time.  Skin: Skin is intact. No rash noted.  Psychiatric: She has a normal mood and affect. Her speech is normal and behavior is normal. Judgment and thought content normal. Cognition and memory are normal.        Assessment & Plan:   Problem List Items Addressed This Visit      Cardiovascular and Mediastinum   POTS (postural orthostatic tachycardia syndrome)    The current medical regimen is effective;  continue present plan and medications.       Atrial fibrillation ([place]) - Primary     Other   Depression    The current medical regimen is effective;  continue present plan and medications.           Follow up plan: Return in about 6 months (around 01/24/2016) for Physical Exam.

## 2015-08-15 NOTE — Progress Notes (Signed)
Electrophysiology Office Note Date: 08/16/2015  ID:  Angela [name], Angela [name] [birth year], MRN [id number]  PCP: Golden Pop, MD Electrophysiologist: Caryl Comes  CC: Pacemaker follow-up  Angela [name] is a 45 y.o. female seen today for Dr Caryl Comes.  She presents today for routine electrophysiology followup.  Since last being seen in our clinic, the patient reports doing very well.  She is exercising 4 days/week and is frustrated with her inability to lose weight.  She has persistent occasional chest pain that is not associated with exertion and has been previously not felt to have been cardiac.  She denies dyspnea, PND, orthopnea, nausea, vomiting, dizziness, syncope, edema.  Device History: MDT dual chamber PPM implanted 2007 for complete heart block   Past Medical History  Diagnosis Date  . SVT (supraventricular tachycardia) (HCC)     s/p ablation a. c/b AV nod ablation requiring pacemaker  . Chest pain     a cath 2/10. EF 50-55% normal coronaries  . Depression   . Dyspnea     CT negative for PE, 2007  . OCD (obsessive compulsive disorder)   . Hypertension   . Insomnia   . Dysautonomia   . Complete heart block [facility])    Past Surgical History  Procedure Laterality Date  . Pelvic laparoscopy  1990  . Tubal ligation  2003  . Breast lumpectomy      RIGHT -BREAST   . Endometrial ablation  2005  . Spark  2005  . Permanent pacemaker generator change N/A 07/13/2014    MDT MRI compatible dual chamber pacemaker implanted by Dr Caryl Comes  . Breast biopsy Right 1998  . Breast biopsy Right 2004    Current Outpatient Prescriptions  Medication Sig Dispense Refill  . Linaclotide (LINZESS) 145 MCG CAPS capsule Take 1 capsule (145 mcg total) by mouth daily. 30 capsule 12  . LORazepam (ATIVAN) 0.5 MG tablet Take 1 tablet (0.5 mg total) by mouth as needed for anxiety. 30 tablet 1  . lubiprostone (AMITIZA) 24 MCG capsule Take 1 capsule (24 mcg total) by mouth 2 (two) times daily with a meal. 30  capsule 12  . meloxicam (MOBIC) 15 MG tablet Take 1 tablet (15 mg total) by mouth as needed. 30 tablet 12  . metoprolol (LOPRESSOR) 50 MG tablet Take 1 tablet (50 mg total) by mouth 2 (two) times daily. 60 tablet 12  . Multiple Vitamins-Minerals (MULTIVITAMINS THER. W/MINERALS) TABS Take 1 tablet by mouth daily.    Marland Kitchen venlafaxine XR (EFFEXOR-XR) 150 MG 24 hr capsule Take 1 capsule (150 mg total) by mouth daily with breakfast. 30 capsule 12   No current facility-administered medications for this visit.    Allergies:   Toprol xl   Social History: Social History   Social History  . Marital Status: Married    Spouse Name: N/A  . Number of Children: N/A  . Years of Education: N/A   Occupational History  . Not on file.   Social History Main Topics  . Smoking status: Never Smoker   . Smokeless tobacco: Never Used  . Alcohol Use: No  . Drug Use: No  . Sexual Activity: Yes    Birth Control/ Protection: Surgical   Other Topics Concern  . Not on file   Social History Narrative    Family History: Family History  Problem Relation Age of Onset  . Hypertension Father   . Cancer Father 21    COLON  . Diabetes Father   . Breast cancer Paternal  Aunt   . Hypertension Maternal Grandfather   . Diabetes Paternal Grandmother   . Breast cancer Paternal Grandmother   . Diabetes Paternal Grandfather   . Breast cancer Maternal Aunt     mastectomy  . Breast cancer Paternal Aunt   . Dementia Maternal Grandmother      Review of Systems: All other systems reviewed and are otherwise negative except as noted above.   Physical Exam: VS:  BP 110/84 mmHg  Pulse 72  Ht 5\' 4"  (1.626 m)  Wt 187 lb 6.4 oz (85.004 kg)  BMI 32.15 kg/m2 , BMI Body mass index is 32.15 kg/(m^2).  GEN- The patient is obese appearing, alert and oriented x 3 today.   HEENT: normocephalic, atraumatic; sclera clear, conjunctiva pink; hearing intact; oropharynx clear; neck supple  Lungs- Clear to ausculation  bilaterally, normal work of breathing.  No wheezes, rales, rhonchi Heart- Regular rate and rhythm, no murmurs, rubs or gallops  GI- soft, non-tender, non-distended, bowel sounds present  Extremities- no clubbing, cyanosis, or edema; DP/PT/radial pulses 2+ bilaterally MS- no significant deformity or atrophy Skin- warm and dry, no rash or lesion; PPM pocket well healed Psych- euthymic mood, full affect Neuro- strength and sensation are intact  PPM Interrogation- reviewed in detail today,  See PACEART report  EKG:  EKG is ordered today. EKG today demonstrated sinus rhythm with ventricular pacing   Recent Labs: 01/19/2015: ALT 13; BUN 10; Creatinine, Ser 0.82; Platelets 340; Potassium 4.6; Sodium 138; TSH 1.020   Wt Readings from Last 3 Encounters:  08/16/15 187 lb 6.4 oz (85.004 kg)  07/27/15 185 lb (83.915 kg)  01/19/15 180 lb (81.647 kg)     Other studies Reviewed: Additional studies/ records that were reviewed today include: Dr Olin Pia office notes   Assessment and Plan:  1.  Complete heart block Normal PPM function See Pace Art report No changes today  2.  POTS Stable No change required today  3.  Atrial tachycardia Burden by device interrogation today 0.1%, all atrial rates <175 Asymptomatic No change at this time   Current medicines are reviewed at length with the patient today.   The patient does not have concerns regarding her medicines.  The following changes were made today:  none  Labs/ tests ordered today include: none    Disposition:   Follow up with [facility] transmissions, Dr Caryl Comes 1 year      Signed, Chanetta Marshall, NP 08/16/2015 8:36 AM  [place] [address] [phone] (office) [phone] (fax)

## 2015-08-16 ENCOUNTER — Encounter: Payer: Self-pay | Admitting: Nurse Practitioner

## 2015-08-16 ENCOUNTER — Encounter: Payer: Self-pay | Admitting: Internal Medicine

## 2015-08-16 ENCOUNTER — Ambulatory Visit (INDEPENDENT_AMBULATORY_CARE_PROVIDER_SITE_OTHER): Payer: Medicare Other | Admitting: Nurse Practitioner

## 2015-08-16 VITALS — BP 110/84 | HR 72 | Ht 64.0 in | Wt 187.4 lb

## 2015-08-16 DIAGNOSIS — R000 Tachycardia, unspecified: Secondary | ICD-10-CM | POA: Diagnosis not present

## 2015-08-16 DIAGNOSIS — I442 Atrioventricular block, complete: Secondary | ICD-10-CM

## 2015-08-16 DIAGNOSIS — I951 Orthostatic hypotension: Secondary | ICD-10-CM

## 2015-08-16 DIAGNOSIS — I471 Supraventricular tachycardia: Secondary | ICD-10-CM

## 2015-08-16 DIAGNOSIS — G90A Postural orthostatic tachycardia syndrome (POTS): Secondary | ICD-10-CM

## 2015-08-16 LAB — CUP PACEART INCLINIC DEVICE CHECK
Battery Voltage: 3.02 V
Brady Statistic AS VP Percent: 89.3 %
Implantable Lead Implant Date: 20070319
Implantable Lead Location: 753859
Implantable Lead Model: 5076
Lead Channel Impedance Value: 380 Ohm
Lead Channel Impedance Value: 475 Ohm
Lead Channel Pacing Threshold Pulse Width: 0.4 ms
Lead Channel Sensing Intrinsic Amplitude: 4.1 mV
Lead Channel Setting Pacing Amplitude: 2 V
Lead Channel Setting Pacing Pulse Width: 0.4 ms
MDC IDC LEAD IMPLANT DT: 20070319
MDC IDC LEAD LOCATION: 753860
MDC IDC MSMT LEADCHNL RA PACING THRESHOLD AMPLITUDE: 0.625 V
MDC IDC MSMT LEADCHNL RA PACING THRESHOLD PULSEWIDTH: 0.4 ms
MDC IDC MSMT LEADCHNL RA SENSING INTR AMPL: 3.8 mV
MDC IDC MSMT LEADCHNL RV PACING THRESHOLD AMPLITUDE: 0.625 V
MDC IDC SESS DTM: 20170315085532
MDC IDC SET LEADCHNL RV PACING AMPLITUDE: 2.5 V
MDC IDC SET LEADCHNL RV SENSING SENSITIVITY: 2 mV
MDC IDC STAT BRADY AP VP PERCENT: 10.6 %
MDC IDC STAT BRADY AP VS PERCENT: 0.1 %
MDC IDC STAT BRADY AS VS PERCENT: 0.1 %

## 2015-08-16 NOTE — Patient Instructions (Signed)
Medication Instructions:   Your physician recommends that you continue on your current medications as directed. Please refer to the Current Medication list given to you today.    If you need a refill on your cardiac medications before your next appointment, please call your pharmacy.  Labwork: NONE ORDER TODAY'    Testing/Procedures:   Follow-Up: Your physician wants you to follow-up in: [place].  You will receive a reminder letter in the mail two months in advance. If you don't receive a letter, please call our office to schedule the follow-up appointment.  Remote monitoring is used to monitor your Pacemaker of ICD from home. This monitoring reduces the number of office visits required to check your device to one time per year. It allows [place] to keep an eye on the functioning of your device to ensure it is working properly. You are scheduled for a device check from home on . 11/17/15..You may send your transmission at any time that day. If you have a wireless device, the transmission will be sent automatically. After your physician reviews your transmission, you will receive a postcard with your next transmission date. '   Any Other Special Instructions Will Be Listed Below (If Applicable).

## 2015-11-17 ENCOUNTER — Ambulatory Visit (INDEPENDENT_AMBULATORY_CARE_PROVIDER_SITE_OTHER): Payer: Medicare Other | Admitting: *Deleted

## 2015-11-17 ENCOUNTER — Telehealth: Payer: Self-pay | Admitting: Cardiology

## 2015-11-17 DIAGNOSIS — I442 Atrioventricular block, complete: Secondary | ICD-10-CM | POA: Diagnosis not present

## 2015-11-17 NOTE — Telephone Encounter (Signed)
Spoke with pt and reminded pt of remote transmission that is due today. Pt verbalized understanding.   

## 2015-11-17 NOTE — Progress Notes (Signed)
Remote pacemaker transmission.   

## 2015-11-21 LAB — CUP PACEART REMOTE DEVICE CHECK
Battery Remaining Longevity: 103 mo
Brady Statistic RA Percent Paced: 17.46 %
Implantable Lead Implant Date: 20070319
Implantable Lead Location: 753860
Implantable Lead Model: 5076
Implantable Lead Model: 5076
Lead Channel Impedance Value: 304 Ohm
Lead Channel Pacing Threshold Amplitude: 0.625 V
Lead Channel Pacing Threshold Pulse Width: 0.4 ms
Lead Channel Pacing Threshold Pulse Width: 0.4 ms
Lead Channel Sensing Intrinsic Amplitude: 4.25 mV
Lead Channel Sensing Intrinsic Amplitude: 4.25 mV
MDC IDC LEAD IMPLANT DT: 20070319
MDC IDC LEAD LOCATION: 753859
MDC IDC MSMT BATTERY VOLTAGE: 3.02 V
MDC IDC MSMT LEADCHNL RA IMPEDANCE VALUE: 399 Ohm
MDC IDC MSMT LEADCHNL RV IMPEDANCE VALUE: 418 Ohm
MDC IDC MSMT LEADCHNL RV IMPEDANCE VALUE: 475 Ohm
MDC IDC MSMT LEADCHNL RV PACING THRESHOLD AMPLITUDE: 0.75 V
MDC IDC MSMT LEADCHNL RV SENSING INTR AMPL: 4 mV
MDC IDC MSMT LEADCHNL RV SENSING INTR AMPL: 4 mV
MDC IDC SESS DTM: 20170616114207
MDC IDC SET LEADCHNL RA PACING AMPLITUDE: 2 V
MDC IDC SET LEADCHNL RV PACING AMPLITUDE: 2.5 V
MDC IDC SET LEADCHNL RV PACING PULSEWIDTH: 0.4 ms
MDC IDC SET LEADCHNL RV SENSING SENSITIVITY: 2 mV
MDC IDC STAT BRADY AP VP PERCENT: 17.32 %
MDC IDC STAT BRADY AP VS PERCENT: 0.14 %
MDC IDC STAT BRADY AS VP PERCENT: 82.13 %
MDC IDC STAT BRADY AS VS PERCENT: 0.41 %
MDC IDC STAT BRADY RV PERCENT PACED: 99.45 %

## 2015-11-22 ENCOUNTER — Encounter: Payer: Self-pay | Admitting: Cardiology

## 2015-12-11 ENCOUNTER — Emergency Department: Payer: Medicare Other

## 2015-12-11 ENCOUNTER — Encounter: Payer: Self-pay | Admitting: Emergency Medicine

## 2015-12-11 ENCOUNTER — Emergency Department
Admission: EM | Admit: 2015-12-11 | Discharge: 2015-12-11 | Disposition: A | Discharge status: Home / Self Care | Payer: Medicare Other | Attending: Emergency Medicine | Admitting: Emergency Medicine

## 2015-12-11 DIAGNOSIS — Z79899 Other long term (current) drug therapy: Secondary | ICD-10-CM | POA: Insufficient documentation

## 2015-12-11 DIAGNOSIS — F329 Major depressive disorder, single episode, unspecified: Secondary | ICD-10-CM | POA: Insufficient documentation

## 2015-12-11 DIAGNOSIS — I10 Essential (primary) hypertension: Secondary | ICD-10-CM | POA: Insufficient documentation

## 2015-12-11 DIAGNOSIS — R1031 Right lower quadrant pain: Secondary | ICD-10-CM | POA: Diagnosis not present

## 2015-12-11 LAB — CBC
HCT: 40.5 % (ref 35.0–47.0)
Hemoglobin: 13.8 g/dL (ref 12.0–16.0)
MCH: 31.1 pg (ref 26.0–34.0)
MCHC: 34.1 g/dL (ref 32.0–36.0)
MCV: 91.1 fL (ref 80.0–100.0)
PLATELETS: 237 10*3/uL (ref 150–440)
RBC: 4.44 MIL/uL (ref 3.80–5.20)
RDW: 12.5 % (ref 11.5–14.5)
WBC: 6.9 10*3/uL (ref 3.6–11.0)

## 2015-12-11 LAB — COMPREHENSIVE METABOLIC PANEL
ALT: 16 U/L (ref 14–54)
AST: 20 U/L (ref 15–41)
Albumin: 4 g/dL (ref 3.5–5.0)
Alkaline Phosphatase: 72 U/L (ref 38–126)
Anion gap: 7 (ref 5–15)
BILIRUBIN TOTAL: 0.4 mg/dL (ref 0.3–1.2)
BUN: 14 mg/dL (ref 6–20)
CO2: 26 mmol/L (ref 22–32)
CREATININE: 0.71 mg/dL (ref 0.44–1.00)
Calcium: 9.3 mg/dL (ref 8.9–10.3)
Chloride: 106 mmol/L (ref 101–111)
Glucose, Bld: 87 mg/dL (ref 65–99)
POTASSIUM: 4.5 mmol/L (ref 3.5–5.1)
Sodium: 139 mmol/L (ref 135–145)
TOTAL PROTEIN: 6.4 g/dL — AB (ref 6.5–8.1)

## 2015-12-11 LAB — URINALYSIS COMPLETE WITH MICROSCOPIC (ARMC ONLY)
BILIRUBIN URINE: NEGATIVE
Bacteria, UA: NONE SEEN
GLUCOSE, UA: NEGATIVE mg/dL
HGB URINE DIPSTICK: NEGATIVE
KETONES UR: NEGATIVE mg/dL
NITRITE: NEGATIVE
Protein, ur: NEGATIVE mg/dL
SPECIFIC GRAVITY, URINE: 1.021 (ref 1.005–1.030)
pH: 6 (ref 5.0–8.0)

## 2015-12-11 LAB — LIPASE, BLOOD: Lipase: 26 U/L (ref 11–51)

## 2015-12-11 IMAGING — CT CT ABD-PELV W/ CM
2 of 5 series · 16 of 46 positions shown, 18 images · IV contrast (iopamidol)
Comparison: None.

CLINICAL DATA: Right lower quadrant pain for 5 days. Chronic
constipation.

EXAM:
CT ABDOMEN AND PELVIS WITH CONTRAST
TECHNIQUE: Multidetector CT imaging of the abdomen and pelvis was performed
using the standard protocol following bolus administration of
intravenous contrast.
CONTRAST:  100mL [YB] IOPAMIDOL ([YB]) INJECTION 61%

[Series 2: routine abd pel with · axial · 0.79mm/px · z∈[-466,-66]mm · 13 of 90 slices shown, 15 images]
[im 5/90  soft-tissue]
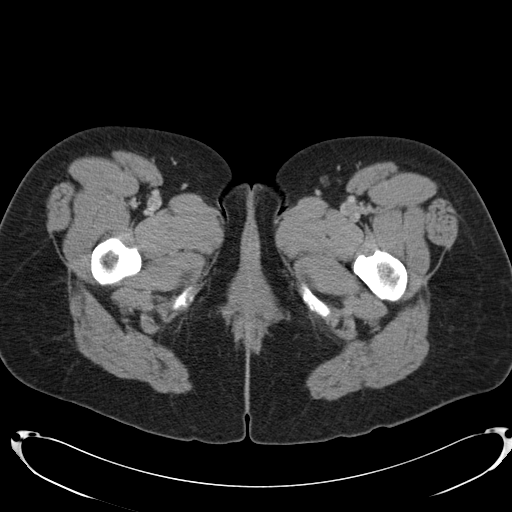
[im 5/90  bone]
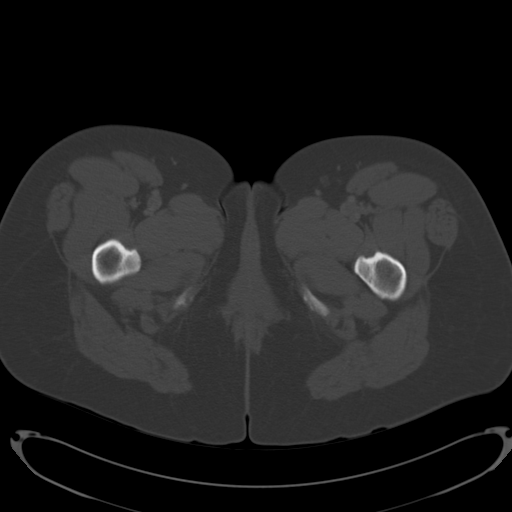
[im 14/90  soft-tissue]
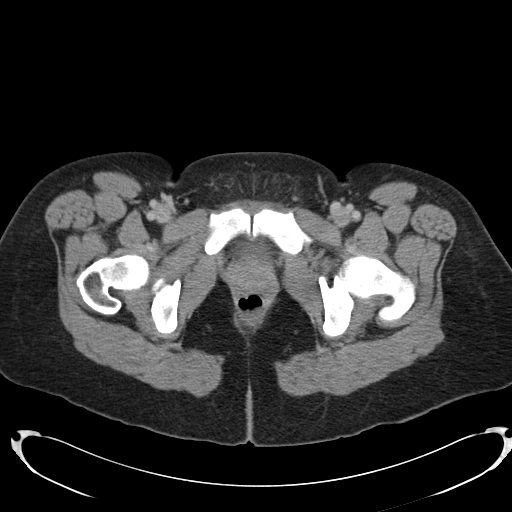
[im 18/90  soft-tissue]
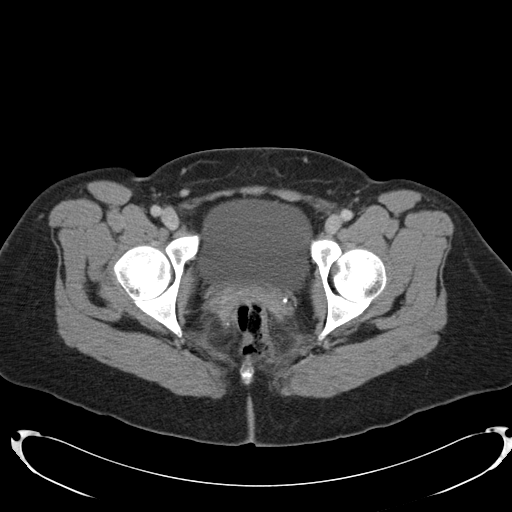
[im 27/90  soft-tissue]
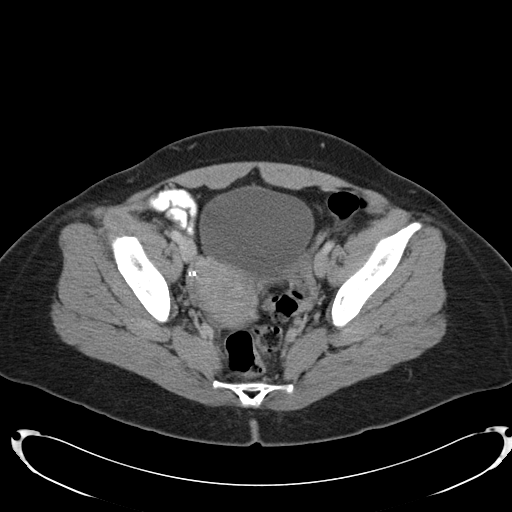
[im 32/90  soft-tissue]
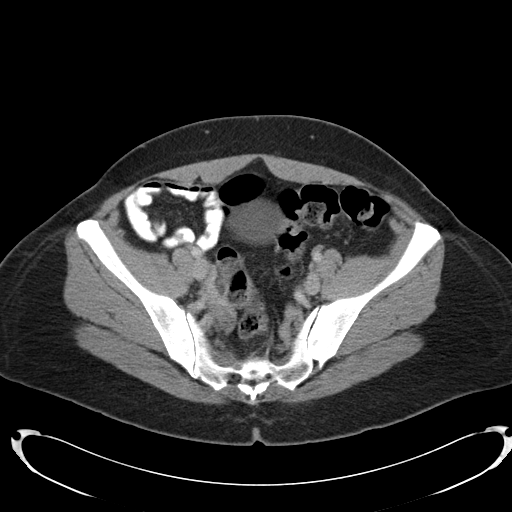
[im 41/90  soft-tissue]
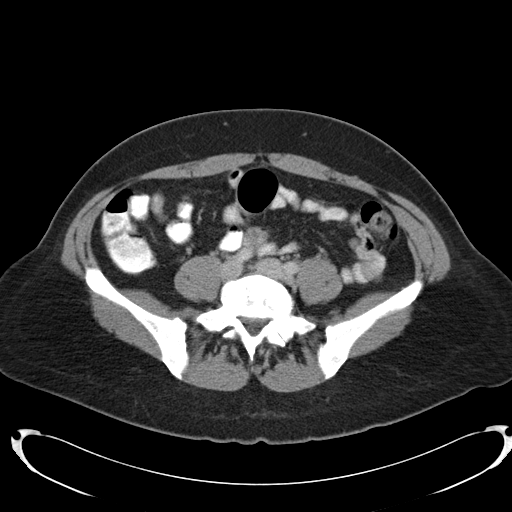
[im 45/90  soft-tissue]
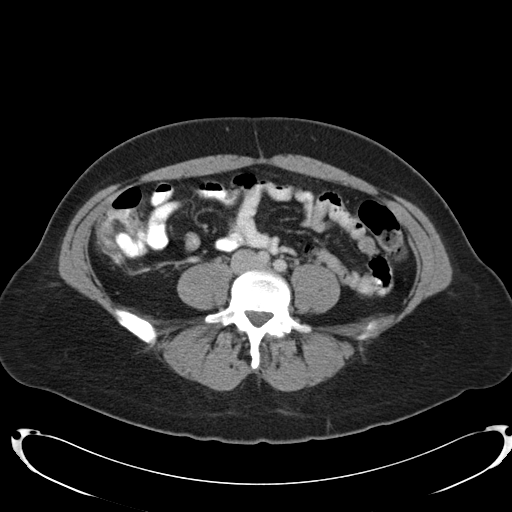
[im 49/90  soft-tissue]
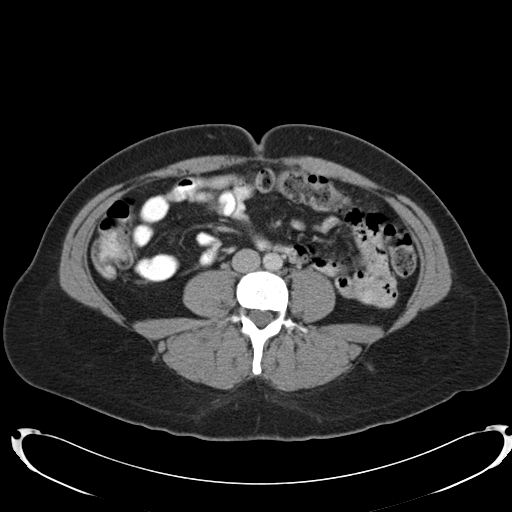
[im 58/90  soft-tissue]
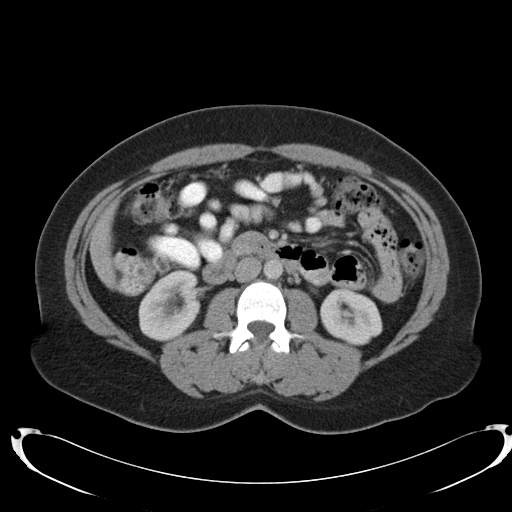
[im 58/90  bone]
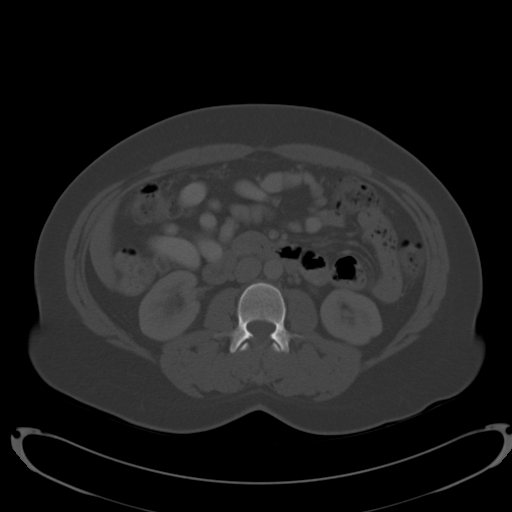
[im 63/90  soft-tissue]
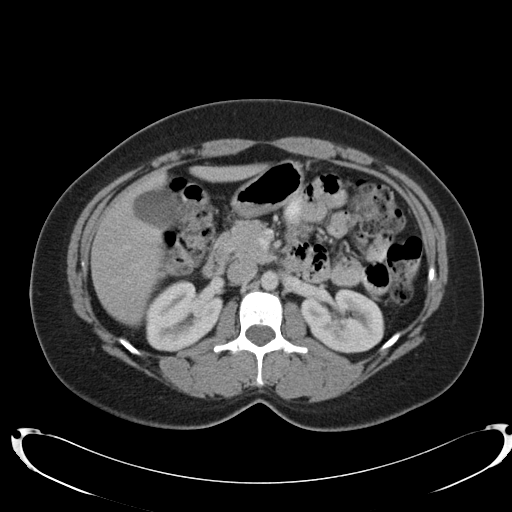
[im 72/90  soft-tissue]
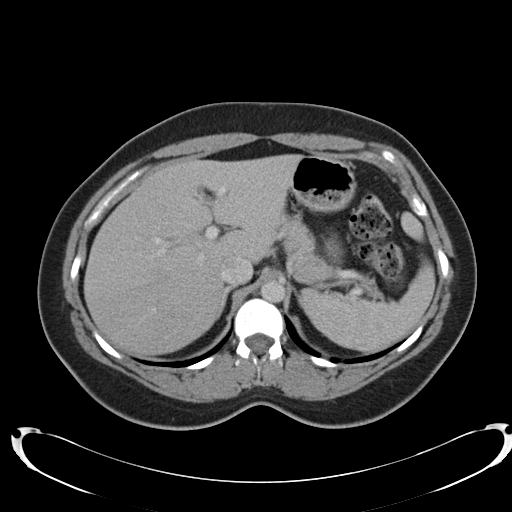
[im 76/90  soft-tissue]
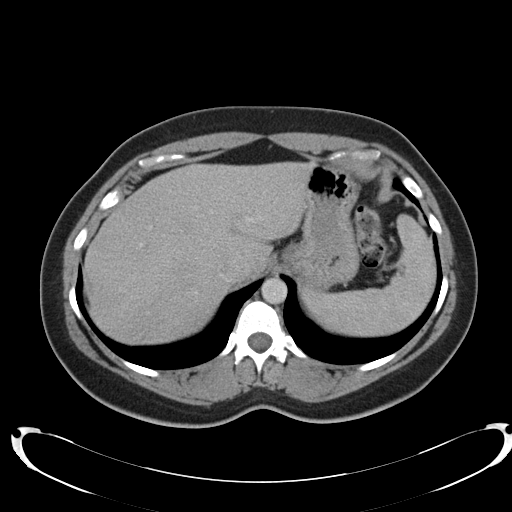
[im 85/90  soft-tissue]
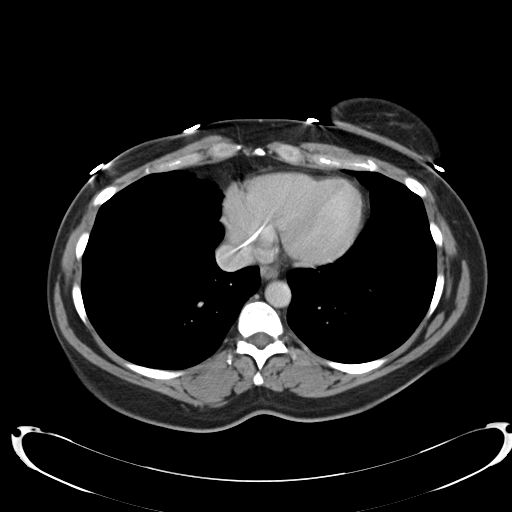

[Series 5: cor routine abd pel with · coronal · 0.78mm/px · 3 of 137 slices shown]
[im 46/137  soft-tissue]
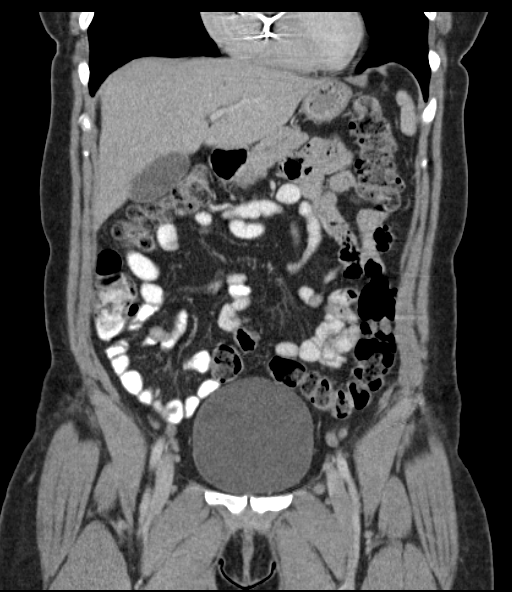
[im 61/137  soft-tissue]
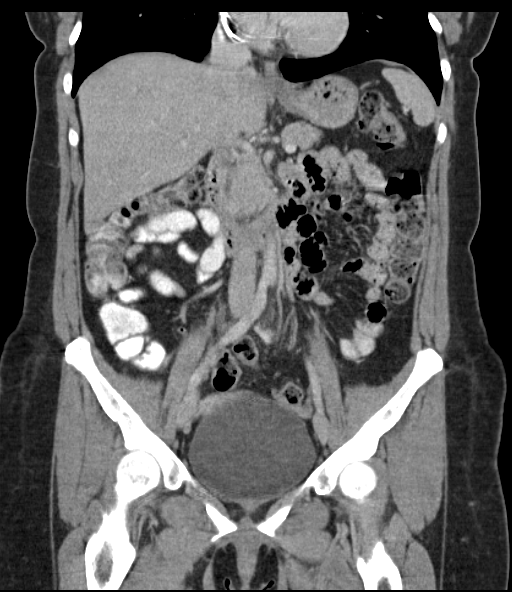
[im 76/137  soft-tissue]
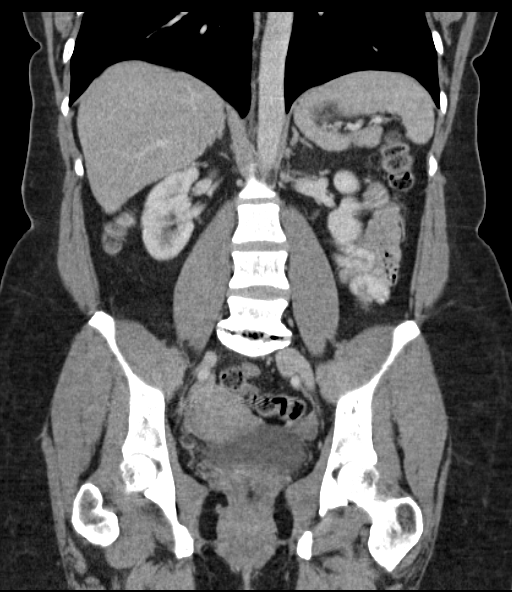

[16 of 46 positions shown; findings below may reference images not displayed]

FINDINGS: Lower chest:  No acute findings.

Hepatobiliary: Several small hypodense foci within the upper aspects
of the liver are too small to definitively characterize but most
compatible with small benign cyst. Liver appears otherwise normal.
Gallbladder appears normal.

Pancreas: No mass, inflammatory changes, or other significant
abnormality.

Spleen: Within normal limits in size and appearance.

Adrenals/Urinary Tract: Adrenal glands appear normal. Kidneys appear
normal without mass, stone or hydronephrosis. No ureteral or bladder
calculi identified. Bladder appears normal.

Stomach/Bowel: Bowel is normal in caliber. No bowel wall thickening
or evidence of bowel wall inflammation. Moderate amount of stool
throughout the nondistended colon. Appendix is normal. Stomach
appears normal.

Vascular/Lymphatic: Abdominal aorta is normal in caliber. No
enlarged lymph nodes seen within the abdomen or pelvis.

Reproductive: Small cyst versus more likely a normal dominant
follicle within the right ovary. Bilateral tubal ligation clips in
place.

Other: No free fluid or abscess collection. No free intraperitoneal
air.

Musculoskeletal: Mild degenerative change at the L5-S1 disc space,
with associated disc bulge and degenerative facet hypertrophy, with
possible associated nerve root impingement. No acute or suspicious
osseous finding. Superficial soft tissues are unremarkable.
IMPRESSION: 1. No acute findings within the abdomen or pelvis. No free fluid. No
bowel obstruction or evidence of bowel wall inflammation. Appendix
is normal.
2. Degenerative change at the L5-S1 disc space, with associated disc
desiccation and diffuse disc bulge causing moderate central canal
stenosis, with associated bilateral neural foramen encroachment with
possible nerve root impingement. Could patient's symptoms be
radiculopathic? If so, consider nonemergent lumbar spine MRI.

## 2015-12-11 MED ORDER — IOPAMIDOL (ISOVUE-300) INJECTION 61%
100.0000 mL | Freq: Once | INTRAVENOUS | Status: AC | PRN
  Administered 2015-12-11: 100 mL via INTRAVENOUS
  Filled 2015-12-11: qty 100

## 2015-12-11 MED ORDER — DIATRIZOATE MEGLUMINE & SODIUM 66-10 % PO SOLN
15.0000 mL | Freq: Once | ORAL | Status: AC
  Administered 2015-12-11: 15 mL via ORAL

## 2015-12-11 NOTE — Discharge Instructions (Signed)
You have been seen in the emergency department today for abdominal pain. Your workup has shown no acute abnormality within the abdomen or pelvis however and does show a bulging disc with possible nerve compression. Please follow-up with orthopedics for further evaluation by calling the number provided. Return to the emergency department for any worsening pain, fever, or any other symptom personally concerning to your self.   Abdominal Pain, Adult Many things can cause belly (abdominal) pain. Most times, the belly pain is not dangerous. Many cases of belly pain can be watched and treated at home. HOME CARE   Do not take medicines that help you go poop (laxatives) unless told to by your doctor.  Only take medicine as told by your doctor.  Eat or drink as told by your doctor. Your doctor will tell you if you should be on a special diet. GET HELP IF:  You do not know what is causing your belly pain.  You have belly pain while you are sick to your stomach (nauseous) or have runny poop (diarrhea).  You have pain while you pee or poop.  Your belly pain wakes you up at night.  You have belly pain that gets worse or better when you eat.  You have belly pain that gets worse when you eat fatty foods.  You have a fever. GET HELP RIGHT AWAY IF:   The pain does not go away within 2 hours.  You keep throwing up (vomiting).  The pain changes and is only in the right or left part of the belly.  You have bloody or tarry looking poop. MAKE SURE YOU:   Understand these instructions.  Will watch your condition.  Will get help right away if you are not doing well or get worse.   This information is not intended to replace advice given to you by your health care provider. Make sure you discuss any questions you have with your health care provider.   Document Released: 11/06/2007 Document Revised: 06/10/2014 Document Reviewed: 01/27/2013 Elsevier Interactive Patient Education [employer].

## 2015-12-11 NOTE — ED Notes (Signed)
Pt discharged to home.  Family member driving.  Discharge instructions reviewed.  Verbalized understanding.  No questions or concerns at this time.  Teach back verified.  Pt in NAD.  No items left in ED.   

## 2015-12-11 NOTE — ED Provider Notes (Addendum)
[facility] Emergency Department Provider Note  Time seen: 3:04 PM  I have reviewed the triage vital signs and the nursing notes.   HISTORY  Chief Complaint Abdominal Pain    HPI Angela [name] is a 45 y.o. female with a past medical history of depression, hypertension, presents the emergency department with right lower quadrant abdominal pain. According to the patient for the past 5 days she is being strains and right lower quadrant abdominal pain. She states she was feeling very constipated, but had a large bowel movement over the weekend without improvement of her discomfort. Denies fever, nausea, vomiting, diarrhea. States her pain and continuing today so she came to the emergency department for evaluation.Describes the pain as moderate dull aching pain in the right mid to lower abdomen. Denies dysuria, vaginal bleeding or discharge.     Past Medical History  Diagnosis Date  . SVT (supraventricular tachycardia) (HCC)     s/p ablation a. c/b AV nod ablation requiring pacemaker  . Chest pain     a cath 2/10. EF 50-55% normal coronaries  . Depression   . Dyspnea     CT negative for PE, 2007  . OCD (obsessive compulsive disorder)   . Hypertension   . Insomnia   . Dysautonomia   . Complete heart block [facility])     Patient Active Problem List   Diagnosis Date Noted  . Atrial fibrillation ([place]) 07/27/2015  . Depression 11/23/2014  . POTS (postural orthostatic tachycardia syndrome) 08/17/2013  . Atrial tachycardia/SINUS tachycardia 06/24/2013  . Mitral valve disease 12/04/2011  . DYSAUTONOMIA 03/23/2009  . ELEVATED BLOOD PRESSURE 03/23/2009  . AV BLOCK, COMPLETE 08/25/2008  . Pacemaker-[employer]tr[employer] 08/25/2008  . LIMB PAIN 08/10/2008    Past Surgical History  Procedure Laterality Date  . Pelvic laparoscopy  1990  . Tubal ligation  2003  . Breast lumpectomy      RIGHT -BREAST   . Endometrial ablation  2005  . Spark  2005  . Permanent pacemaker  generator change N/A 07/13/2014    MDT MRI compatible dual chamber pacemaker implanted by Dr Caryl Comes  . Breast biopsy Right 1998  . Breast biopsy Right 2004    Current Outpatient Rx  Name  Route  Sig  Dispense  Refill  . Linaclotide (LINZESS) 145 MCG CAPS capsule   Oral   Take 1 capsule (145 mcg total) by mouth daily.   30 capsule   12   . LORazepam (ATIVAN) 0.5 MG tablet   Oral   Take 1 tablet (0.5 mg total) by mouth as needed for anxiety.   30 tablet   1   . lubiprostone (AMITIZA) 24 MCG capsule   Oral   Take 1 capsule (24 mcg total) by mouth 2 (two) times daily with a meal.   30 capsule   12   . meloxicam (MOBIC) 15 MG tablet   Oral   Take 1 tablet (15 mg total) by mouth as needed.   30 tablet   12   . metoprolol (LOPRESSOR) 50 MG tablet   Oral   Take 1 tablet (50 mg total) by mouth 2 (two) times daily.   60 tablet   12   . Multiple Vitamins-Minerals (MULTIVITAMINS THER. W/MINERALS) TABS   Oral   Take 1 tablet by mouth daily.         Marland Kitchen venlafaxine XR (EFFEXOR-XR) 150 MG 24 hr capsule   Oral   Take 1 capsule (150 mg total) by mouth daily  with breakfast.   30 capsule   12     Allergies Toprol xl  Family History  Problem Relation Age of Onset  . Hypertension Father   . Cancer Father 63    COLON  . Diabetes Father   . Breast cancer Paternal Aunt   . Hypertension Maternal Grandfather   . Diabetes Paternal Grandmother   . Breast cancer Paternal Grandmother   . Diabetes Paternal Grandfather   . Breast cancer Maternal Aunt     mastectomy  . Breast cancer Paternal Aunt   . Dementia Maternal Grandmother     Social History Social History  Substance Use Topics  . Smoking status: Never Smoker   . Smokeless tobacco: Never Used  . Alcohol Use: No    Review of Systems Constitutional: Negative for fever. Cardiovascular: Negative for chest pain. Respiratory: Negative for shortness of breath. Gastrointestinal: Right lower quadrant abdominal pain.  Negative for nausea, vomiting, diarrhea Genitourinary: Negative for dysuria. Negative for vaginal bleeding or discharge. Musculoskeletal: Negative for back pain. Neurological: Negative for headache 10-point ROS otherwise negative.  ____________________________________________   PHYSICAL EXAM:  VITAL SIGNS: ED Triage Vitals  Enc Vitals Group     BP 12/11/15 1328 114/78 mmHg     Pulse Rate 12/11/15 1328 64     Resp 12/11/15 1328 16     Temp 12/11/15 1328 98.3 F (36.8 C)     Temp Source 12/11/15 1328 Oral     SpO2 12/11/15 1328 99 %     Weight 12/11/15 1328 178 lb (80.74 kg)     Height 12/11/15 1328 5\' 4"  (1.626 m)     Head Cir --      Peak Flow --      Pain Score 12/11/15 1329 5     Pain Loc --      Pain Edu? --      Excl. in [place]? --     Constitutional: Alert and oriented. Well appearing and in no distress. Eyes: Normal exam ENT   Head: Normocephalic and atraumatic   Mouth/Throat: Mucous membranes are moist. Cardiovascular: Normal rate, regular rhythm. No murmur Respiratory: Normal respiratory effort without tachypnea nor retractions. Breath sounds are clear  Gastrointestinal: Soft, moderate right lower quadrant tenderness to palpation over McBurney's point. No rebound or guarding. No distention. No skin rash to this area. Musculoskeletal: Nontender with normal range of motion in all extremities. Neurologic:  Normal speech and language. No gross focal neurologic deficits  Skin:  Skin is warm, dry and intact. No rash noted. Psychiatric: Mood and affect are normal.  ____________________________________________   RADIOLOGY  CT shows no acute abnormality. Possible nerve root compression.  EKG reviewed and interpreted by myself appears to show a paced rhythm at 72 bpm, nonspecific ST changes.  ____________________________________________   INITIAL IMPRESSION / ASSESSMENT AND PLAN / ED COURSE  Pertinent labs & imaging results that were available during my care  of the patient were reviewed by me and considered in my medical decision making (see chart for details).  The patient presents the emergency department moderate right lower quadrant abdominal tenderness to palpation. We will check labs and CT abdomen/pelvis to further evaluate. Patient agreeable to plan.  Patient CT scan shows no acute abnormality in the abdomen/pelvis. It does show a bulging disc possible nerve root compression. I discussed this with the patient, she does state for the past 3 weeks she has been noticing occasional discomfort radiating down her right leg. Patient does have moderate tenderness palpation  of the right side of her abdomen. I doubt the 2 are related, but we will refer to orthopedics for further evaluation and possible MRI. Patient agreeable to plan. ____________________________________________   FINAL CLINICAL IMPRESSION(S) / ED DIAGNOSES  Right lower quadrant abdominal pain   Harvest Dark, MD 12/11/15 1808  Harvest Dark, MD 12/11/15 1810

## 2015-12-11 NOTE — ED Notes (Signed)
States right lower abd/ groin pain radiating to her back, states hx of chronic constipation, denies any problems with urination, pt awake and alert in no acute distress

## 2015-12-11 NOTE — ED Notes (Signed)
Pt presents to ED c/o pain in the right side described as constant since last thursday. Pain further irritated by bowel movements. Pt took medication for constipation that helped "a little" but not that much. Denies fever, n/v and diarrhea.

## 2016-01-24 ENCOUNTER — Encounter: Payer: Self-pay | Admitting: Family Medicine

## 2016-01-24 ENCOUNTER — Ambulatory Visit (INDEPENDENT_AMBULATORY_CARE_PROVIDER_SITE_OTHER): Payer: Medicare Other | Admitting: Family Medicine

## 2016-01-24 VITALS — BP 103/70 | HR 64 | Temp 97.6°F | Ht 65.0 in | Wt 172.0 lb

## 2016-01-24 DIAGNOSIS — G90A Postural orthostatic tachycardia syndrome (POTS): Secondary | ICD-10-CM

## 2016-01-24 DIAGNOSIS — I951 Orthostatic hypotension: Secondary | ICD-10-CM | POA: Diagnosis not present

## 2016-01-24 DIAGNOSIS — Z0000 Encounter for general adult medical examination without abnormal findings: Secondary | ICD-10-CM | POA: Diagnosis not present

## 2016-01-24 DIAGNOSIS — R000 Tachycardia, unspecified: Secondary | ICD-10-CM | POA: Diagnosis not present

## 2016-01-24 DIAGNOSIS — F32A Depression, unspecified: Secondary | ICD-10-CM

## 2016-01-24 DIAGNOSIS — F329 Major depressive disorder, single episode, unspecified: Secondary | ICD-10-CM

## 2016-01-24 DIAGNOSIS — F429 Obsessive-compulsive disorder, unspecified: Secondary | ICD-10-CM | POA: Diagnosis not present

## 2016-01-24 MED ORDER — LINACLOTIDE 72 MCG PO CAPS: 145.0000 ug | ORAL_CAPSULE | Freq: Every day | ORAL | 6 refills | Status: DC

## 2016-01-24 MED ORDER — MELOXICAM 15 MG PO TABS: 15.0000 mg | ORAL_TABLET | ORAL | 12 refills | Status: DC | PRN

## 2016-01-24 MED ORDER — VENLAFAXINE HCL ER 150 MG PO CP24: 150.0000 mg | ORAL_CAPSULE | Freq: Every day | ORAL | 12 refills | Status: DC

## 2016-01-24 MED ORDER — METOPROLOL TARTRATE 50 MG PO TABS: 50.0000 mg | ORAL_TABLET | Freq: Two times a day (BID) | ORAL | 12 refills | Status: DC

## 2016-01-24 MED ORDER — LUBIPROSTONE 24 MCG PO CAPS: 24.0000 ug | ORAL_CAPSULE | Freq: Two times a day (BID) | ORAL | 12 refills | Status: DC

## 2016-01-24 NOTE — Assessment & Plan Note (Signed)
The current medical regimen is effective;  continue present plan and medications.  

## 2016-01-24 NOTE — Assessment & Plan Note (Signed)
With chronic constipation stable with medication

## 2016-01-24 NOTE — Progress Notes (Signed)
BP 103/70 (BP Location: Left Arm, Patient Position: Sitting, Cuff Size: Normal)   Pulse 64   Temp 97.6 F (36.4 C)   Ht 5\' 5"  (1.651 m)   Wt 172 lb (78 kg)   SpO2 99%   BMI 28.62 kg/m    Subjective:    Patient ID: Angela [name], female    DOB: May 15, 1971, 45 y.o.   MRN: [id number]  HPI: Angela [name] is a 45 y.o. female Physical exam Patient all in all doing amazingly well still having episodes of atrial fibrillation on pacemaker download query POTs is stable OCD symptoms manageable Depression stable Constipation takes Amitiza every day with occasional Lizness and has problems with constipation  Relevant past medical, surgical, family and social history reviewed and updated as indicated. Interim medical history since our last visit reviewed. Allergies and medications reviewed and updated.  Review of Systems  Constitutional: Negative.   HENT: Negative.   Eyes: Negative.   Respiratory: Negative.   Cardiovascular: Negative.   Gastrointestinal: Negative.   Endocrine: Negative.   Genitourinary: Negative.   Musculoskeletal: Negative.   Skin: Negative.   Allergic/Immunologic: Negative.   Neurological: Negative.   Hematological: Negative.   Psychiatric/Behavioral: Negative.     Per HPI unless specifically indicated above     Objective:    BP 103/70 (BP Location: Left Arm, Patient Position: Sitting, Cuff Size: Normal)   Pulse 64   Temp 97.6 F (36.4 C)   Ht 5\' 5"  (1.651 m)   Wt 172 lb (78 kg)   SpO2 99%   BMI 28.62 kg/m   Wt Readings from Last 3 Encounters:  01/24/16 172 lb (78 kg)  12/11/15 178 lb (80.7 kg)  08/16/15 187 lb 6.4 oz (85 kg)    Physical Exam  Constitutional: She is oriented to person, place, and time. She appears well-developed and well-nourished.  HENT:  Head: Normocephalic and atraumatic.  Right Ear: External ear normal.  Left Ear: External ear normal.  Nose: Nose normal.  Mouth/Throat: Oropharynx is clear and moist.  Eyes:  Conjunctivae and EOM are normal. Pupils are equal, round, and reactive to light.  Neck: Normal range of motion. Neck supple. Carotid bruit is not present.  Cardiovascular: Normal rate, regular rhythm and normal heart sounds.   No murmur heard. Pulmonary/Chest: Effort normal and breath sounds normal. She exhibits no mass. Right breast exhibits no mass, no skin change and no tenderness. Left breast exhibits no mass, no skin change and no tenderness. Breasts are symmetrical.  Abdominal: Soft. Bowel sounds are normal. There is no hepatosplenomegaly.  Musculoskeletal: Normal range of motion.  Neurological: She is alert and oriented to person, place, and time.  Skin: No rash noted.  Psychiatric: She has a normal mood and affect. Her behavior is normal. Judgment and thought content normal.    Results for orders placed or performed in visit on 01/24/16  HM PAP SMEAR  Result Value Ref Range   HM Pap smear PAP and HPV Negative       Assessment & Plan:   Problem List Items Addressed This Visit      Cardiovascular and Mediastinum   POTS (postural orthostatic tachycardia syndrome)    The current medical regimen is effective;  continue present plan and medications.       Relevant Medications   metoprolol (LOPRESSOR) 50 MG tablet   lubiprostone (AMITIZA) 24 MCG capsule   linaclotide (LINZESS) 72 MCG capsule     Other   Depression  The current medical regimen is effective;  continue present plan and medications.       Relevant Medications   venlafaxine XR (EFFEXOR-XR) 150 MG 24 hr capsule   Other Relevant Orders   TSH   OCD (obsessive compulsive disorder)   Relevant Orders   TSH    Other Visit Diagnoses    PE (physical exam), annual    -  Primary   Relevant Orders   TSH   HIV antibody       Follow up plan: Return in about 6 months (around 07/26/2016) for BMP.

## 2016-01-25 ENCOUNTER — Encounter: Payer: Self-pay | Admitting: Family Medicine

## 2016-01-25 LAB — HIV ANTIBODY (ROUTINE TESTING W REFLEX): HIV SCREEN 4TH GENERATION: NONREACTIVE

## 2016-01-25 LAB — TSH: TSH: 0.862 u[IU]/mL (ref 0.450–4.500)

## 2016-02-13 ENCOUNTER — Other Ambulatory Visit: Payer: Self-pay | Admitting: Family Medicine

## 2016-02-13 DIAGNOSIS — G90A Postural orthostatic tachycardia syndrome (POTS): Secondary | ICD-10-CM

## 2016-02-13 DIAGNOSIS — R000 Tachycardia, unspecified: Principal | ICD-10-CM

## 2016-02-13 DIAGNOSIS — I951 Orthostatic hypotension: Principal | ICD-10-CM

## 2016-02-18 ENCOUNTER — Other Ambulatory Visit: Payer: Self-pay | Admitting: Family Medicine

## 2016-02-18 DIAGNOSIS — F32A Depression, unspecified: Secondary | ICD-10-CM

## 2016-02-18 DIAGNOSIS — F329 Major depressive disorder, single episode, unspecified: Secondary | ICD-10-CM

## 2016-02-19 ENCOUNTER — Ambulatory Visit (INDEPENDENT_AMBULATORY_CARE_PROVIDER_SITE_OTHER): Payer: Medicare Other | Admitting: *Deleted

## 2016-02-19 DIAGNOSIS — I442 Atrioventricular block, complete: Secondary | ICD-10-CM | POA: Diagnosis not present

## 2016-02-19 NOTE — Progress Notes (Signed)
Remote pacemaker transmission.   

## 2016-02-21 ENCOUNTER — Encounter: Payer: Self-pay | Admitting: Cardiology

## 2016-03-04 LAB — CUP PACEART REMOTE DEVICE CHECK
Battery Remaining Longevity: 93 mo
Battery Voltage: 3.01 V
Brady Statistic AP VP Percent: 27.21 %
Brady Statistic AS VP Percent: 72.5 %
Brady Statistic RV Percent Paced: 99.71 %
Implantable Lead Implant Date: 20070319
Implantable Lead Location: 753859
Implantable Lead Model: 5076
Lead Channel Impedance Value: 361 Ohm
Lead Channel Impedance Value: 418 Ohm
Lead Channel Impedance Value: 475 Ohm
Lead Channel Pacing Threshold Amplitude: 0.625 V
Lead Channel Sensing Intrinsic Amplitude: 3.25 mV
Lead Channel Sensing Intrinsic Amplitude: 3.25 mV
Lead Channel Sensing Intrinsic Amplitude: 3.625 mV
Lead Channel Setting Pacing Amplitude: 2 V
Lead Channel Setting Pacing Amplitude: 2.5 V
Lead Channel Setting Pacing Pulse Width: 0.4 ms
Lead Channel Setting Sensing Sensitivity: 2 mV
MDC IDC LEAD IMPLANT DT: 20070319
MDC IDC LEAD LOCATION: 753860
MDC IDC MSMT LEADCHNL RA IMPEDANCE VALUE: 285 Ohm
MDC IDC MSMT LEADCHNL RA PACING THRESHOLD PULSEWIDTH: 0.4 ms
MDC IDC MSMT LEADCHNL RA SENSING INTR AMPL: 3.625 mV
MDC IDC MSMT LEADCHNL RV PACING THRESHOLD AMPLITUDE: 0.625 V
MDC IDC MSMT LEADCHNL RV PACING THRESHOLD PULSEWIDTH: 0.4 ms
MDC IDC SESS DTM: 20170918111557
MDC IDC STAT BRADY AP VS PERCENT: 0.06 %
MDC IDC STAT BRADY AS VS PERCENT: 0.23 %
MDC IDC STAT BRADY RA PERCENT PACED: 27.27 %

## 2016-03-15 ENCOUNTER — Other Ambulatory Visit: Payer: Self-pay | Admitting: Family Medicine

## 2016-03-15 DIAGNOSIS — F32A Depression, unspecified: Secondary | ICD-10-CM

## 2016-03-15 DIAGNOSIS — F329 Major depressive disorder, single episode, unspecified: Secondary | ICD-10-CM

## 2016-03-18 NOTE — Telephone Encounter (Signed)
rx

## 2016-05-17 ENCOUNTER — Other Ambulatory Visit: Payer: Self-pay | Admitting: Family Medicine

## 2016-05-17 DIAGNOSIS — Z1231 Encounter for screening mammogram for malignant neoplasm of breast: Secondary | ICD-10-CM

## 2016-05-20 ENCOUNTER — Ambulatory Visit (INDEPENDENT_AMBULATORY_CARE_PROVIDER_SITE_OTHER): Payer: Medicare Other | Admitting: *Deleted

## 2016-05-20 DIAGNOSIS — I442 Atrioventricular block, complete: Secondary | ICD-10-CM | POA: Diagnosis not present

## 2016-05-20 NOTE — Progress Notes (Signed)
Remote pacemaker transmission.   

## 2016-05-21 LAB — CUP PACEART REMOTE DEVICE CHECK
Brady Statistic AP VP Percent: 20.45 %
Brady Statistic AP VS Percent: 0.06 %
Brady Statistic AS VS Percent: 0.25 %
Brady Statistic RV Percent Paced: 99.68 %
Implantable Lead Implant Date: 20070319
Implantable Lead Location: 753859
Lead Channel Impedance Value: 380 Ohm
Lead Channel Impedance Value: 418 Ohm
Lead Channel Impedance Value: 494 Ohm
Lead Channel Pacing Threshold Amplitude: 0.625 V
Lead Channel Sensing Intrinsic Amplitude: 3.625 mV
Lead Channel Sensing Intrinsic Amplitude: 3.75 mV
Lead Channel Sensing Intrinsic Amplitude: 3.75 mV
Lead Channel Setting Pacing Amplitude: 2 V
Lead Channel Setting Pacing Amplitude: 2.5 V
Lead Channel Setting Pacing Pulse Width: 0.4 ms
Lead Channel Setting Sensing Sensitivity: 2 mV
MDC IDC LEAD IMPLANT DT: 20070319
MDC IDC LEAD LOCATION: 753860
MDC IDC MSMT BATTERY REMAINING LONGEVITY: 90 mo
MDC IDC MSMT BATTERY VOLTAGE: 3.01 V
MDC IDC MSMT LEADCHNL RA IMPEDANCE VALUE: 285 Ohm
MDC IDC MSMT LEADCHNL RA PACING THRESHOLD AMPLITUDE: 0.5 V
MDC IDC MSMT LEADCHNL RA PACING THRESHOLD PULSEWIDTH: 0.4 ms
MDC IDC MSMT LEADCHNL RA SENSING INTR AMPL: 3.625 mV
MDC IDC MSMT LEADCHNL RV PACING THRESHOLD PULSEWIDTH: 0.4 ms
MDC IDC PG IMPLANT DT: 20160210
MDC IDC SESS DTM: 20171218142659
MDC IDC STAT BRADY AS VP PERCENT: 79.24 %
MDC IDC STAT BRADY RA PERCENT PACED: 20.46 %

## 2016-05-22 ENCOUNTER — Encounter: Payer: Self-pay | Admitting: Cardiology

## 2016-06-24 ENCOUNTER — Ambulatory Visit
Admission: RE | Admit: 2016-06-24 | Discharge: 2016-06-24 | Disposition: A | Discharge status: Home / Self Care | Payer: Medicare Other | Source: Ambulatory Visit | Attending: Family Medicine | Admitting: Family Medicine

## 2016-06-24 DIAGNOSIS — Z1231 Encounter for screening mammogram for malignant neoplasm of breast: Secondary | ICD-10-CM | POA: Diagnosis present

## 2016-06-24 IMAGING — MG MM DIGITAL SCREENING BILAT W/ TOMO W/ CAD
8 of 13 series · 8 of 29 positions shown · non-contrast
Comparison: Previous exam(s).

CLINICAL DATA: Screening.

EXAM:
2D DIGITAL SCREENING BILATERAL MAMMOGRAM WITH CAD AND ADJUNCT TOMO

[L MLO (1 of 2)]
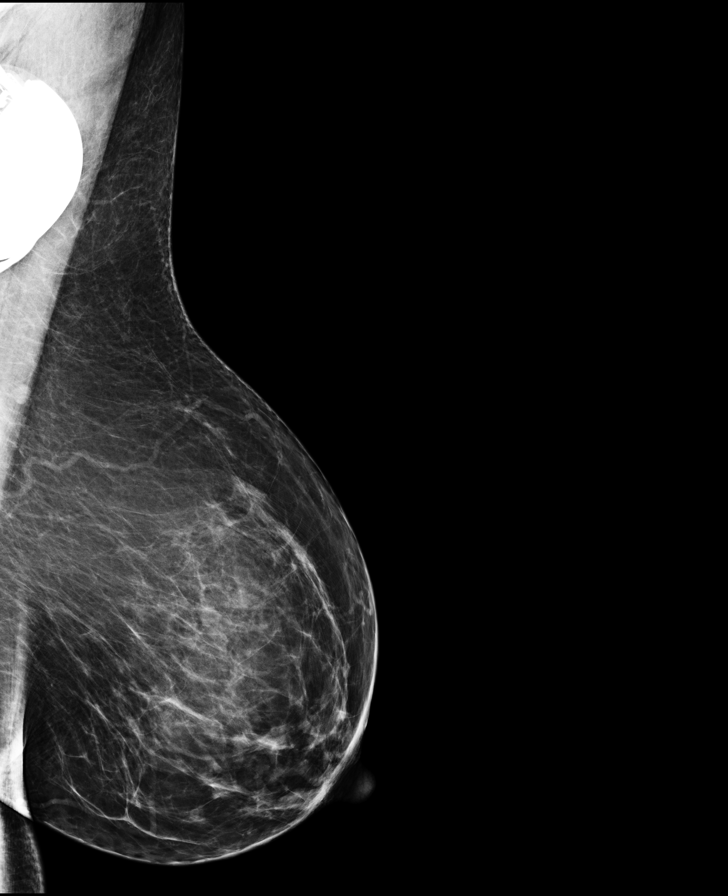

[L MLO (2 of 2)]
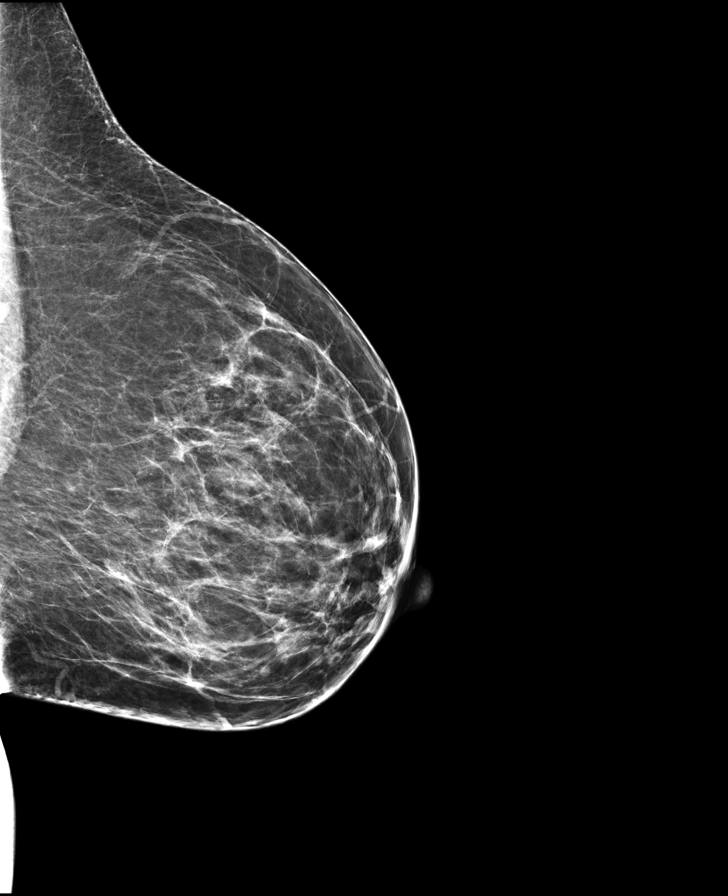

[R CC]
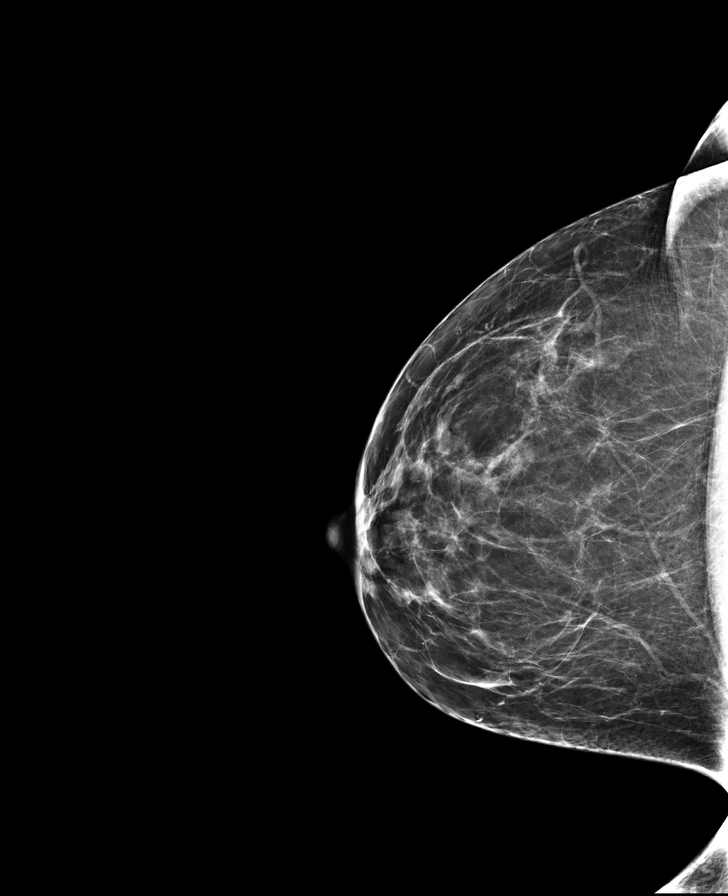

[R CC synth-2D]
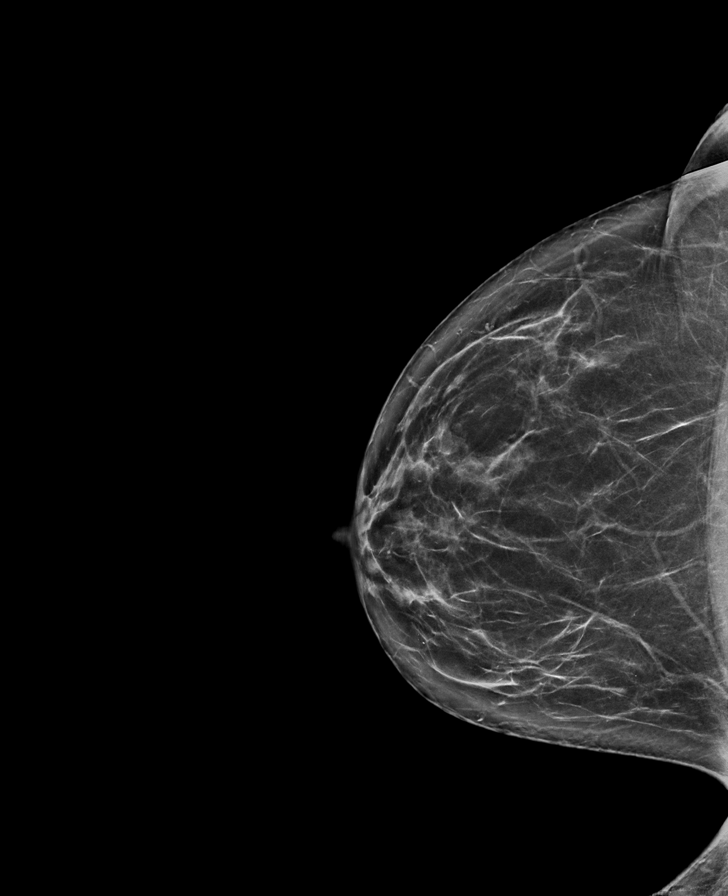

[L MLO synth-2D]
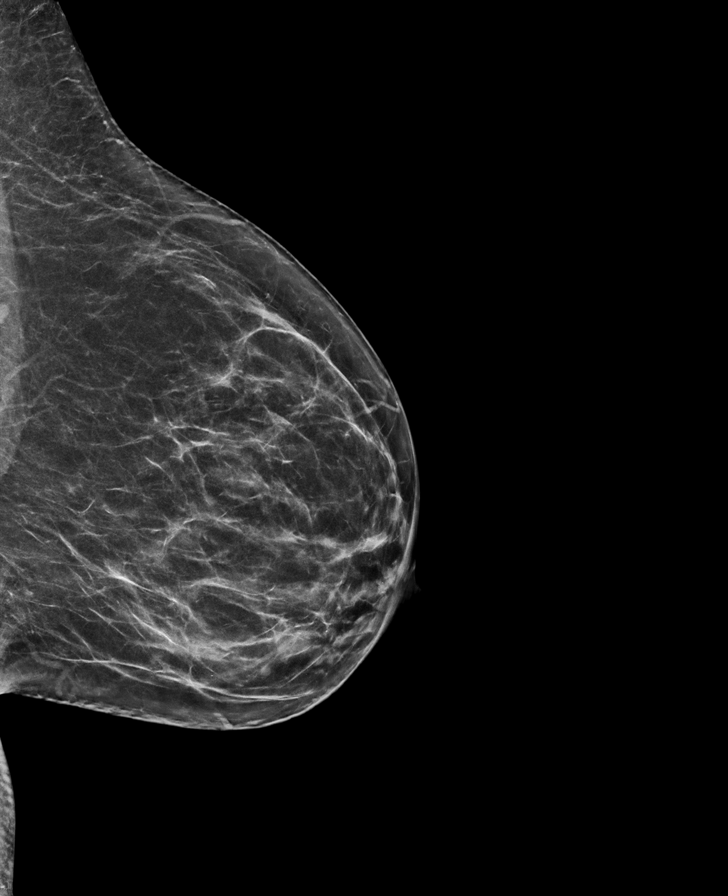

[L CC synth-2D]
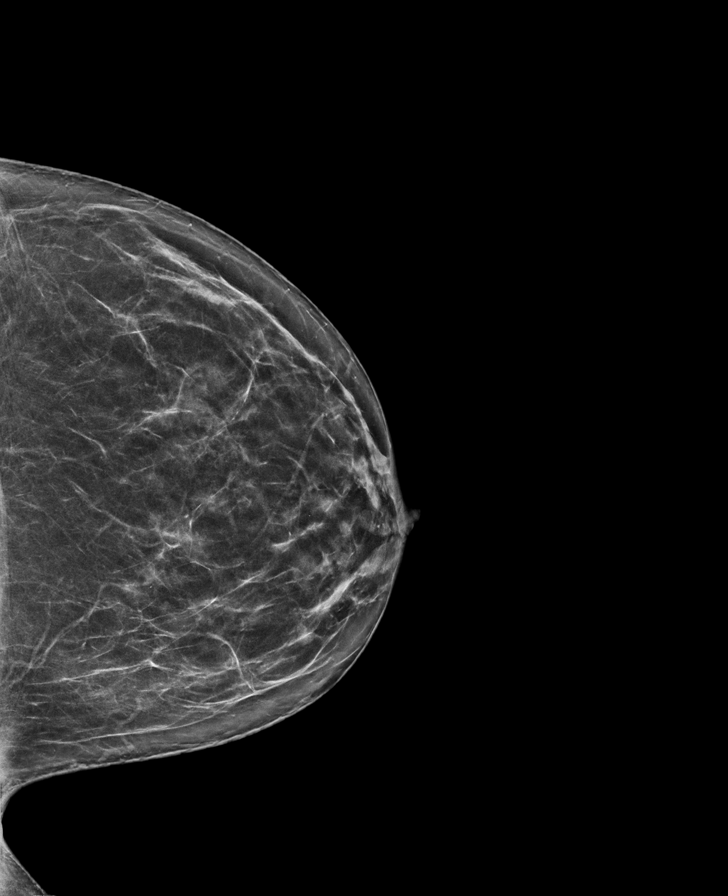

[R MLO]
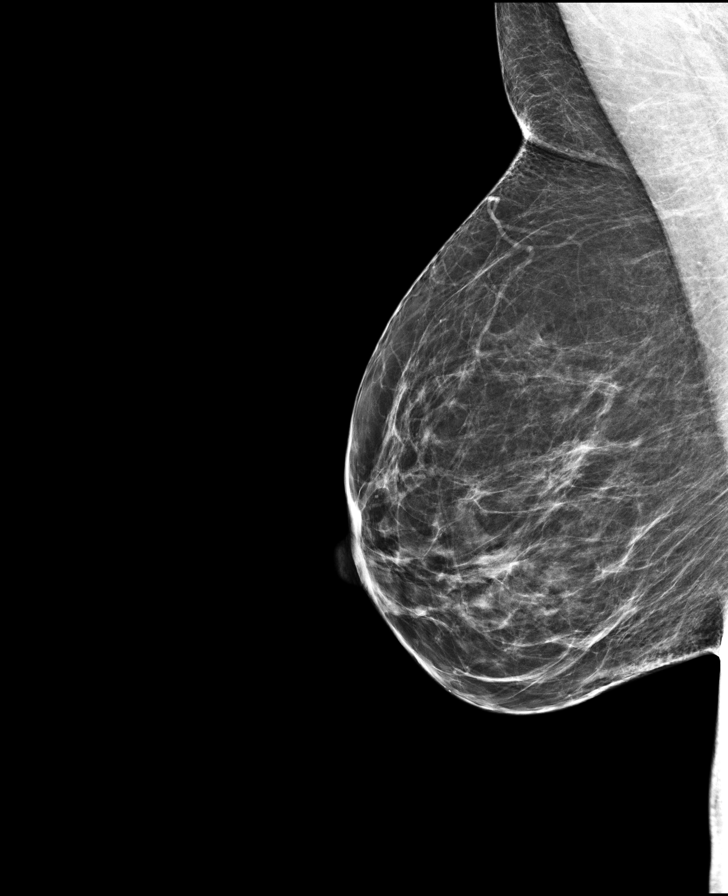

[L CC]
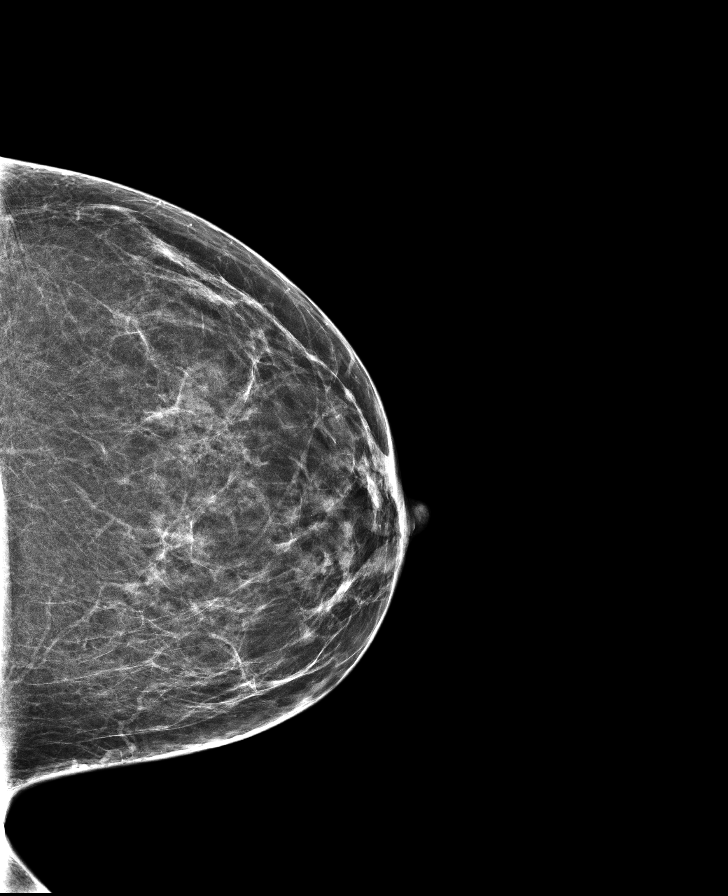

[8 of 29 positions shown; findings below may reference images not displayed]

ACR Breast Density Category b: There are scattered areas of
fibroglandular density.
FINDINGS: There are no findings suspicious for malignancy. Cardiac pacemaker
apparatus overlies the left axilla. Images were processed with CAD.
IMPRESSION: No mammographic evidence of malignancy. A result letter of this
screening mammogram will be mailed directly to the patient.

RECOMMENDATION:
Screening mammogram in one year. (Code:[ZX])

BI-RADS CATEGORY  1: Negative.

## 2016-07-30 ENCOUNTER — Encounter: Payer: Self-pay | Admitting: Family Medicine

## 2016-07-30 ENCOUNTER — Ambulatory Visit (INDEPENDENT_AMBULATORY_CARE_PROVIDER_SITE_OTHER): Payer: Medicare Other | Admitting: Family Medicine

## 2016-07-30 VITALS — BP 119/75 | HR 86 | Ht 65.0 in | Wt 175.0 lb

## 2016-07-30 DIAGNOSIS — F429 Obsessive-compulsive disorder, unspecified: Secondary | ICD-10-CM

## 2016-07-30 DIAGNOSIS — G90A Postural orthostatic tachycardia syndrome (POTS): Secondary | ICD-10-CM

## 2016-07-30 DIAGNOSIS — I951 Orthostatic hypotension: Secondary | ICD-10-CM | POA: Diagnosis not present

## 2016-07-30 DIAGNOSIS — R000 Tachycardia, unspecified: Secondary | ICD-10-CM

## 2016-07-30 DIAGNOSIS — F3289 Other specified depressive episodes: Secondary | ICD-10-CM

## 2016-07-30 DIAGNOSIS — Z79899 Other long term (current) drug therapy: Secondary | ICD-10-CM | POA: Diagnosis not present

## 2016-07-30 MED ORDER — LORAZEPAM 0.5 MG PO TABS: 0.5000 mg | ORAL_TABLET | Freq: Every day | ORAL | 0 refills | Status: DC | PRN

## 2016-07-30 NOTE — Assessment & Plan Note (Signed)
The current medical regimen is effective;  continue present plan and medications.  

## 2016-07-30 NOTE — Progress Notes (Signed)
   BP 119/75   Pulse 86   Ht 5\' 5"  (1.651 m)   Wt 175 lb (79.4 kg)   SpO2 99%   BMI 29.12 kg/m    Subjective:    Patient ID: Angela [name], female    DOB: 05-27-1971, 46 y.o.   MRN: [id number]  HPI: Angela [name] is a 46 y.o. female  Chief Complaint  Patient presents with  . Follow-up  Patient all in all doing well. Has lost some weight is now stuck at 175. Bowels are doing well. Depression doing stable patient exercising on a regular basis. Patient's nerves remaining very stable reviewed lorazepam use and still has bottle that was filled and 2016 Has used some this weekend's daughter had a car wreck everyone is okay but there is no cardiac drive.  Relevant past medical, surgical, family and social history reviewed and updated as indicated. Interim medical history since our last visit reviewed. Allergies and medications reviewed and updated.  Review of Systems  Constitutional: Negative.   Respiratory: Negative.   Cardiovascular: Negative.     Per HPI unless specifically indicated above     Objective:    BP 119/75   Pulse 86   Ht 5\' 5"  (1.651 m)   Wt 175 lb (79.4 kg)   SpO2 99%   BMI 29.12 kg/m   Wt Readings from Last 3 Encounters:  07/30/16 175 lb (79.4 kg)  01/24/16 172 lb (78 kg)  12/11/15 178 lb (80.7 kg)    Physical Exam  Constitutional: She is oriented to person, place, and time. She appears well-developed and well-nourished.  HENT:  Head: Normocephalic and atraumatic.  Eyes: Conjunctivae and EOM are normal.  Neck: Normal range of motion.  Cardiovascular: Normal rate, regular rhythm and normal heart sounds.   Pulmonary/Chest: Effort normal and breath sounds normal.  Musculoskeletal: Normal range of motion.  Neurological: She is alert and oriented to person, place, and time.  Skin: No erythema.  Psychiatric: She has a normal mood and affect. Her behavior is normal. Judgment and thought content normal.        Assessment & Plan:   Problem  List Items Addressed This Visit      Cardiovascular and Mediastinum   POTS (postural orthostatic tachycardia syndrome)     Other   Depression    The current medical regimen is effective;  continue present plan and medications.       Relevant Medications   LORazepam (ATIVAN) 0.5 MG tablet   OCD (obsessive compulsive disorder)    The current medical regimen is effective;  continue present plan and medications.        Other Visit Diagnoses    Medication management    -  Primary   Relevant Orders   Basic metabolic panel       Follow up plan: Return in about 6 months (around 01/27/2017) for Physical Exam.

## 2016-07-31 ENCOUNTER — Encounter: Payer: Self-pay | Admitting: Family Medicine

## 2016-07-31 LAB — BASIC METABOLIC PANEL
BUN / CREAT RATIO: 16 (ref 9–23)
BUN: 14 mg/dL (ref 6–24)
CHLORIDE: 101 mmol/L (ref 96–106)
CO2: 25 mmol/L (ref 18–29)
Calcium: 9.3 mg/dL (ref 8.7–10.2)
Creatinine, Ser: 0.86 mg/dL (ref 0.57–1.00)
GFR calc Af Amer: 94 mL/min/{1.73_m2} (ref >59)
GFR, EST NON AFRICAN AMERICAN: 82 mL/min/{1.73_m2} (ref >59)
Glucose: 78 mg/dL (ref 65–99)
POTASSIUM: 4.7 mmol/L (ref 3.5–5.2)
Sodium: 140 mmol/L (ref 134–144)

## 2016-08-02 ENCOUNTER — Encounter: Payer: Self-pay | Admitting: Internal Medicine

## 2016-08-13 ENCOUNTER — Encounter: Payer: Self-pay | Admitting: Internal Medicine

## 2016-08-13 ENCOUNTER — Ambulatory Visit (INDEPENDENT_AMBULATORY_CARE_PROVIDER_SITE_OTHER): Payer: Medicare Other | Admitting: Internal Medicine

## 2016-08-13 VITALS — BP 137/86 | HR 86 | Ht 64.0 in | Wt 176.2 lb

## 2016-08-13 DIAGNOSIS — G909 Disorder of the autonomic nervous system, unspecified: Secondary | ICD-10-CM

## 2016-08-13 DIAGNOSIS — I471 Supraventricular tachycardia: Secondary | ICD-10-CM | POA: Diagnosis not present

## 2016-08-13 DIAGNOSIS — Z950 Presence of cardiac pacemaker: Secondary | ICD-10-CM | POA: Diagnosis not present

## 2016-08-13 DIAGNOSIS — G901 Familial dysautonomia [Riley-Day]: Secondary | ICD-10-CM

## 2016-08-13 DIAGNOSIS — I442 Atrioventricular block, complete: Secondary | ICD-10-CM

## 2016-08-13 LAB — CUP PACEART INCLINIC DEVICE CHECK
Battery Remaining Longevity: 88 mo
Brady Statistic AP VP Percent: 21.53 %
Brady Statistic AP VS Percent: 0.06 %
Brady Statistic RV Percent Paced: 99.68 %
Date Time Interrogation Session: 20180313165131
Implantable Lead Implant Date: 20070319
Implantable Lead Location: 753860
Implantable Lead Model: 5076
Implantable Lead Model: 5076
Implantable Pulse Generator Implant Date: 20160210
Lead Channel Impedance Value: 304 Ohm
Lead Channel Impedance Value: 494 Ohm
Lead Channel Pacing Threshold Pulse Width: 0.4 ms
Lead Channel Sensing Intrinsic Amplitude: 3.75 mV
Lead Channel Setting Pacing Amplitude: 2 V
Lead Channel Setting Pacing Pulse Width: 0.4 ms
Lead Channel Setting Sensing Sensitivity: 2 mV
MDC IDC LEAD IMPLANT DT: 20070319
MDC IDC LEAD LOCATION: 753859
MDC IDC MSMT BATTERY VOLTAGE: 3.01 V
MDC IDC MSMT LEADCHNL RA IMPEDANCE VALUE: 399 Ohm
MDC IDC MSMT LEADCHNL RA PACING THRESHOLD AMPLITUDE: 0.5 V
MDC IDC MSMT LEADCHNL RA SENSING INTR AMPL: 2.8 mV
MDC IDC MSMT LEADCHNL RV IMPEDANCE VALUE: 437 Ohm
MDC IDC MSMT LEADCHNL RV PACING THRESHOLD AMPLITUDE: 0.5 V
MDC IDC MSMT LEADCHNL RV PACING THRESHOLD PULSEWIDTH: 0.4 ms
MDC IDC SET LEADCHNL RV PACING AMPLITUDE: 2.5 V
MDC IDC STAT BRADY AS VP PERCENT: 78.16 %
MDC IDC STAT BRADY AS VS PERCENT: 0.25 %
MDC IDC STAT BRADY RA PERCENT PACED: 21.54 %

## 2016-08-13 NOTE — Patient Instructions (Signed)
Medication Instructions: Your physician recommends that you continue on your current medications as directed. Please refer to the Current Medication list given to you today.   Labwork: None Ordered  Procedures/Testing: None Ordered  Follow-Up: Your physician wants you to follow-up in 1 YEAR with Chanetta Marshall, NP. You will receive a reminder letter in the mail two months in advance. If you don't receive a letter, please call our office to schedule the follow-up appointment.  Remote monitoring is used to monitor your Pacemaker from home. This monitoring reduces the number of office visits required to check your device to one time per year. It allows [place] to keep an eye on the functioning of your device to ensure it is working properly. You are scheduled for a device check from home on 11/14/16. You may send your transmission at any time that day. If you have a wireless device, the transmission will be sent automatically. After your physician reviews your transmission, you will receive a postcard with your next transmission date.    Any Additional Special Instructions Will Be Listed Below (If Applicable).     If you need a refill on your cardiac medications before your next appointment, please call your pharmacy.

## 2016-08-13 NOTE — Progress Notes (Signed)
Patient Care Team: Guadalupe Maple, MD as PCP - General (Unknown Physician Specialty)   HPI  Angela [name] is a 46 y.o. 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   Because of some degree of atrial tachycardia/sinus tachycardia we have tried reprogrammed her device DDD--DDI. She did not tolerate this at all. We subsequently programmed her back with a lower upper tracking rate  She underwent device revision surgery 2/16  having reverted to VVI and felt terrible.  She is much improved. The heat however remains a problem. Lower extremity edema is issue also.   She finds her exercise tolerance is limited    Past Medical History:  Diagnosis Date  . Chest pain    a cath 2/10. EF 50-55% normal coronaries  . Complete heart block ([place])   . Depression   . Dysautonomia   . Dyspnea    CT negative for PE, 2007  . Hypertension   . Insomnia   . OCD (obsessive compulsive disorder)   . SVT (supraventricular tachycardia) ([facility])    s/p ablation a. c/b AV nod ablation requiring pacemaker    Past Surgical History:  Procedure Laterality Date  . BREAST BIOPSY Right 1998  . BREAST BIOPSY Right 2004  . BREAST LUMPECTOMY     RIGHT -BREAST   . ENDOMETRIAL ABLATION  2005  . PELVIC LAPAROSCOPY  1990  . PERMANENT PACEMAKER GENERATOR CHANGE N/A 07/13/2014   MDT MRI compatible dual chamber pacemaker implanted by Dr Caryl Comes  . SPARK  2005  . TUBAL LIGATION  2003    Current Outpatient Prescriptions  Medication Sig Dispense Refill  . linaclotide (LINZESS) 72 MCG capsule Take 2 capsules (145 mcg total) by mouth daily. 30 capsule 6  . LORazepam (ATIVAN) 0.5 MG tablet Take 1 tablet (0.5 mg total) by mouth daily as needed for anxiety. 30 tablet 0  . lubiprostone (AMITIZA) 24 MCG capsule Take 1 capsule (24 mcg total) by  mouth 2 (two) times daily with a meal. 30 capsule 12  . meloxicam (MOBIC) 15 MG tablet Take 1 tablet (15 mg total) by mouth as needed. 30 tablet 12  . methocarbamol (ROBAXIN) 500 MG tablet TK 1 TO 2 TS PO Q 6 H PRN P  0  . metoprolol (LOPRESSOR) 50 MG tablet Take 1 tablet (50 mg total) by mouth 2 (two) times daily. 60 tablet 12  . Multiple Vitamins-Minerals (MULTIVITAMINS THER. W/MINERALS) TABS Take 1 tablet by mouth daily.    Marland Kitchen venlafaxine XR (EFFEXOR-XR) 150 MG 24 hr capsule Take 1 capsule (150 mg total) by mouth daily with breakfast. 30 capsule 12   No current facility-administered medications for this visit.     Allergies  Allergen Reactions  . Toprol Xl [Metoprolol Succinate]     Review of Systems negative except from HPI and PMH  Physical Exam BP 137/86   Pulse 86   Ht 5\' 4"  (1.626 m)   Wt 176 lb 3.2 oz (79.9 kg)   SpO2 99%   BMI 30.24 kg/m  Well developed and nourished in no acute distress HENT normal Neck supple with JVP-flat Clear Regular rate and rhythm, no murmurs or gallops Abd-soft with active BS No Clubbing cyanosis edema Skin-warm and dry A & Oriented  Grossly normal sensory and motor function  ECG was  obtained and demonstrated AV pacing  Assessment and  Plan  Complete heart block  Pacemaker [employer]     POTS  Obesity  Anxiety/depression    Atrial tachycardia/  Elevated blood pressure  Ventricular sensed events question mechanism   Not sure the cause of exercise tolerance but we will increase max tracking rate ( reversing what we had done previously )  We will see If that improves exercise toleance.  I hope so   Reviewed dysautonomia again  Anxiety and depression again  We spent more than 50% of our >25 min visit in face to face counseling regarding the above

## 2016-10-04 ENCOUNTER — Encounter: Payer: Self-pay | Admitting: Internal Medicine

## 2016-11-12 ENCOUNTER — Ambulatory Visit (INDEPENDENT_AMBULATORY_CARE_PROVIDER_SITE_OTHER): Payer: Medicare Other | Admitting: *Deleted

## 2016-11-12 DIAGNOSIS — I442 Atrioventricular block, complete: Secondary | ICD-10-CM

## 2016-11-13 ENCOUNTER — Encounter: Payer: Self-pay | Admitting: Cardiology

## 2016-11-13 NOTE — Progress Notes (Signed)
Remote pacemaker transmission.   

## 2016-11-14 LAB — CUP PACEART REMOTE DEVICE CHECK
Brady Statistic AP VS Percent: 0.02 %
Brady Statistic AS VP Percent: 87.29 %
Brady Statistic AS VS Percent: 0.07 %
Brady Statistic RV Percent Paced: 99.9 %
Implantable Lead Implant Date: 20070319
Implantable Lead Location: 753860
Implantable Lead Model: 5076
Implantable Pulse Generator Implant Date: 20160210
Lead Channel Impedance Value: 285 Ohm
Lead Channel Impedance Value: 399 Ohm
Lead Channel Pacing Threshold Pulse Width: 0.4 ms
Lead Channel Pacing Threshold Pulse Width: 0.4 ms
Lead Channel Sensing Intrinsic Amplitude: 3.5 mV
Lead Channel Sensing Intrinsic Amplitude: 3.5 mV
Lead Channel Sensing Intrinsic Amplitude: 4.125 mV
Lead Channel Setting Pacing Amplitude: 2.5 V
Lead Channel Setting Pacing Pulse Width: 0.4 ms
MDC IDC LEAD IMPLANT DT: 20070319
MDC IDC LEAD LOCATION: 753859
MDC IDC MSMT BATTERY REMAINING LONGEVITY: 86 mo
MDC IDC MSMT BATTERY VOLTAGE: 3.01 V
MDC IDC MSMT LEADCHNL RA PACING THRESHOLD AMPLITUDE: 0.625 V
MDC IDC MSMT LEADCHNL RA SENSING INTR AMPL: 4.125 mV
MDC IDC MSMT LEADCHNL RV IMPEDANCE VALUE: 418 Ohm
MDC IDC MSMT LEADCHNL RV IMPEDANCE VALUE: 494 Ohm
MDC IDC MSMT LEADCHNL RV PACING THRESHOLD AMPLITUDE: 0.625 V
MDC IDC SESS DTM: 20180612121514
MDC IDC SET LEADCHNL RA PACING AMPLITUDE: 2 V
MDC IDC SET LEADCHNL RV SENSING SENSITIVITY: 2 mV
MDC IDC STAT BRADY AP VP PERCENT: 12.62 %
MDC IDC STAT BRADY RA PERCENT PACED: 12.63 %

## 2017-01-17 ENCOUNTER — Ambulatory Visit (INDEPENDENT_AMBULATORY_CARE_PROVIDER_SITE_OTHER): Payer: Medicare Other

## 2017-01-17 VITALS — BP 127/80 | HR 73 | Temp 98.1°F | Resp 16 | Ht 64.0 in | Wt 174.4 lb

## 2017-01-17 DIAGNOSIS — Z0000 Encounter for general adult medical examination without abnormal findings: Secondary | ICD-10-CM

## 2017-01-17 NOTE — Patient Instructions (Signed)
Angela [name] , Thank you for taking time to come for your Medicare Wellness Visit. I appreciate your ongoing commitment to your health goals. Please review the following plan we discussed and let me know if I can assist you in the future.   Screening recommendations/referrals: Colonoscopy: completed 06/29/2008 Mammogram: completed 06/25/2016 Bone Density: Due at age 46 Recommended yearly ophthalmology/optometry visit for glaucoma screening and checkup Recommended yearly dental visit for hygiene and checkup  Vaccinations: Influenza vaccine: due 02/2017 Pneumococcal vaccine: due at age 38 Tdap vaccine: up to date Shingles vaccine: Due at age 69  Advanced directives: Advance directive discussed with you today. I have provided a copy for you to complete at home and have notarized. Once this is complete please bring a copy in to our office so we can scan it into your chart.  Conditions/risks identified: none  Next appointment: Follow up on 02/27/2017 at 9:00am with Dr.Crissman. Follow up in one year for your annual wellness exam.  Preventive Care 40-64 Years, Female Preventive care refers to lifestyle choices and visits with your health care provider that can promote health and wellness. What does preventive care include?  A yearly physical exam. This is also called an annual well check.  Dental exams once or twice a year.  Routine eye exams. Ask your health care provider how often you should have your eyes checked.  Personal lifestyle choices, including:  Daily care of your teeth and gums.  Regular physical activity.  Eating a healthy diet.  Avoiding tobacco and drug use.  Limiting alcohol use.  Practicing safe sex.  Taking low-dose aspirin daily starting at age 76.  Taking vitamin and mineral supplements as recommended by your health care provider. What happens during an annual well check? The services and screenings done by your health care provider during your annual well  check will depend on your age, overall health, lifestyle risk factors, and family history of disease. Counseling  Your health care provider may ask you questions about your:  Alcohol use.  Tobacco use.  Drug use.  Emotional well-being.  Home and relationship well-being.  Sexual activity.  Eating habits.  Work and work Statistician.  Method of birth control.  Menstrual cycle.  Pregnancy history. Screening  You may have the following tests or measurements:  Height, weight, and BMI.  Blood pressure.  Lipid and cholesterol levels. These may be checked every 5 years, or more frequently if you are over 46 years old.  Skin check.  Lung cancer screening. You may have this screening every year starting at age 66 if you have a 30-pack-year history of smoking and currently smoke or have quit within the past 15 years.  Fecal occult blood test (FOBT) of the stool. You may have this test every year starting at age 68.  Flexible sigmoidoscopy or colonoscopy. You may have a sigmoidoscopy every 5 years or a colonoscopy every 10 years starting at age 33.  Hepatitis C blood test.  Hepatitis B blood test.  Sexually transmitted disease (STD) testing.  Diabetes screening. This is done by checking your blood sugar (glucose) after you have not eaten for a while (fasting). You may have this done every 1-3 years.  Mammogram. This may be done every 1-2 years. Talk to your health care provider about when you should start having regular mammograms. This may depend on whether you have a family history of breast cancer.  BRCA-related cancer screening. This may be done if you have a family history of breast, ovarian,  tubal, or peritoneal cancers.  Pelvic exam and Pap test. This may be done every 3 years starting at age 75. Starting at age 79, this may be done every 5 years if you have a Pap test in combination with an HPV test.  Bone density scan. This is done to screen for osteoporosis. You  may have this scan if you are at high risk for osteoporosis. Discuss your test results, treatment options, and if necessary, the need for more tests with your health care provider. Vaccines  Your health care provider may recommend certain vaccines, such as:  Influenza vaccine. This is recommended every year.  Tetanus, diphtheria, and acellular pertussis (Tdap, Td) vaccine. You may need a Td booster every 10 years.  Zoster vaccine. You may need this after age 79.  Pneumococcal 13-valent conjugate (PCV13) vaccine. You may need this if you have certain conditions and were not previously vaccinated.  Pneumococcal polysaccharide (PPSV23) vaccine. You may need one or two doses if you smoke cigarettes or if you have certain conditions. Talk to your health care provider about which screenings and vaccines you need and how often you need them. This information is not intended to replace advice given to you by your health care provider. Make sure you discuss any questions you have with your health care provider. Document Released: 06/16/2015 Document Revised: 02/07/2016 Document Reviewed: 03/21/2015 Elsevier Interactive Patient Education  2017 [employer] Prevention in the Home Falls can cause injuries. They can happen to people of all ages. There are many things you can do to make your home safe and to help prevent falls. What can I do on the outside of my home?  Regularly fix the edges of walkways and driveways and fix any cracks.  Remove anything that might make you trip as you walk through a door, such as a raised step or threshold.  Trim any bushes or trees on the path to your home.  Use bright outdoor lighting.  Clear any walking paths of anything that might make someone trip, such as rocks or tools.  Regularly check to see if handrails are loose or broken. Make sure that both sides of any steps have handrails.  Any raised decks and porches should have guardrails on the  edges.  Have any leaves, snow, or ice cleared regularly.  Use sand or salt on walking paths during winter.  Clean up any spills in your garage right away. This includes oil or grease spills. What can I do in the bathroom?  Use night lights.  Install grab bars by the toilet and in the tub and shower. Do not use towel bars as grab bars.  Use non-skid mats or decals in the tub or shower.  If you need to sit down in the shower, use a plastic, non-slip stool.  Keep the floor dry. Clean up any water that spills on the floor as soon as it happens.  Remove soap buildup in the tub or shower regularly.  Attach bath mats securely with double-sided non-slip rug tape.  Do not have throw rugs and other things on the floor that can make you trip. What can I do in the bedroom?  Use night lights.  Make sure that you have a light by your bed that is easy to reach.  Do not use any sheets or blankets that are too big for your bed. They should not hang down onto the floor.  Have a firm chair that has side arms.  You can use this for support while you get dressed.  Do not have throw rugs and other things on the floor that can make you trip. What can I do in the kitchen?  Clean up any spills right away.  Avoid walking on wet floors.  Keep items that you use a lot in easy-to-reach places.  If you need to reach something above you, use a strong step stool that has a grab bar.  Keep electrical cords out of the way.  Do not use floor polish or wax that makes floors slippery. If you must use wax, use non-skid floor wax.  Do not have throw rugs and other things on the floor that can make you trip. What can I do with my stairs?  Do not leave any items on the stairs.  Make sure that there are handrails on both sides of the stairs and use them. Fix handrails that are broken or loose. Make sure that handrails are as long as the stairways.  Check any carpeting to make sure that it is firmly  attached to the stairs. Fix any carpet that is loose or worn.  Avoid having throw rugs at the top or bottom of the stairs. If you do have throw rugs, attach them to the floor with carpet tape.  Make sure that you have a light switch at the top of the stairs and the bottom of the stairs. If you do not have them, ask someone to add them for you. What else can I do to help prevent falls?  Wear shoes that:  Do not have high heels.  Have rubber bottoms.  Are comfortable and fit you well.  Are closed at the toe. Do not wear sandals.  If you use a stepladder:  Make sure that it is fully opened. Do not climb a closed stepladder.  Make sure that both sides of the stepladder are locked into place.  Ask someone to hold it for you, if possible.  Clearly mark and make sure that you can see:  Any grab bars or handrails.  First and last steps.  Where the edge of each step is.  Use tools that help you move around (mobility aids) if they are needed. These include:  Canes.  Walkers.  Scooters.  Crutches.  Turn on the lights when you go into a dark area. Replace any light bulbs as soon as they burn out.  Set up your furniture so you have a clear path. Avoid moving your furniture around.  If any of your floors are uneven, fix them.  If there are any pets around you, be aware of where they are.  Review your medicines with your doctor. Some medicines can make you feel dizzy. This can increase your chance of falling. Ask your doctor what other things that you can do to help prevent falls. This information is not intended to replace advice given to you by your health care provider. Make sure you discuss any questions you have with your health care provider. Document Released: 03/16/2009 Document Revised: 10/26/2015 Document Reviewed: 06/24/2014 Elsevier Interactive Patient Education  2017 [employer].

## 2017-01-17 NOTE — Progress Notes (Signed)
Subjective:   Angela [name] is a 46 y.o. female who presents for an Initial Medicare Annual Wellness Visit.  Review of Systems     Cardiac Risk Factors include: family history of premature cardiovascular disease     Objective:    Today's Vitals   01/17/17 0941  BP: 127/80  Pulse: 73  Resp: 16  Temp: 98.1 F (36.7 C)  Weight: 174 lb 6.4 oz (79.1 kg)  Height: 5\' 4"  (1.626 m)   Body mass index is 29.94 kg/m.   Current Medications (verified) Outpatient Encounter Prescriptions as of 01/17/2017  Medication Sig  . linaclotide (LINZESS) 72 MCG capsule Take 2 capsules (145 mcg total) by mouth daily.  Marland Kitchen LORazepam (ATIVAN) 0.5 MG tablet Take 1 tablet (0.5 mg total) by mouth daily as needed for anxiety.  Marland Kitchen lubiprostone (AMITIZA) 24 MCG capsule Take 1 capsule (24 mcg total) by mouth 2 (two) times daily with a meal.  . meloxicam (MOBIC) 15 MG tablet Take 1 tablet (15 mg total) by mouth as needed.  . methocarbamol (ROBAXIN) 500 MG tablet TK 1 TO 2 TS PO Q 6 H PRN P  . metoprolol (LOPRESSOR) 50 MG tablet Take 1 tablet (50 mg total) by mouth 2 (two) times daily.  . Multiple Vitamins-Minerals (MULTIVITAMINS THER. W/MINERALS) TABS Take 1 tablet by mouth daily.  Marland Kitchen venlafaxine XR (EFFEXOR-XR) 150 MG 24 hr capsule Take 1 capsule (150 mg total) by mouth daily with breakfast.   No facility-administered encounter medications on file as of 01/17/2017.     Allergies (verified) Toprol xl [metoprolol succinate]   History: Past Medical History:  Diagnosis Date  . Chest pain    a cath 2/10. EF 50-55% normal coronaries  . Complete heart block ([place])   . Depression   . Dysautonomia   . Dyspnea    CT negative for PE, 2007  . Hypertension   . Insomnia   . OCD (obsessive compulsive disorder)   . SVT (supraventricular tachycardia) (HCC)    s/p ablation a. c/b AV nod ablation requiring pacemaker   Past Surgical History:  Procedure Laterality Date  . BREAST BIOPSY Right 1998  . BREAST  BIOPSY Right 2004  . BREAST LUMPECTOMY     RIGHT -BREAST   . ENDOMETRIAL ABLATION  2005  . PELVIC LAPAROSCOPY  1990  . PERMANENT PACEMAKER GENERATOR CHANGE N/A 07/13/2014   MDT MRI compatible dual chamber pacemaker implanted by Dr Caryl Comes  . SPARK  2005  . TUBAL LIGATION  2003   Family History  Problem Relation Age of Onset  . Hypertension Father   . Cancer Father 4       COLON  . Diabetes Father   . Breast cancer Paternal Aunt   . Hypertension Maternal Grandfather   . Diabetes Paternal Grandmother   . Breast cancer Paternal Grandmother   . Diabetes Paternal Grandfather   . Breast cancer Maternal Aunt        mastectomy  . Dementia Maternal Grandmother   . Breast cancer Paternal Aunt    Social History   Occupational History  . Not on file.   Social History Main Topics  . Smoking status: Never Smoker  . Smokeless tobacco: Never Used  . Alcohol use No  . Drug use: No  . Sexual activity: Yes    Birth control/ protection: Surgical    Tobacco Counseling Counseling given: Not Answered   Activities of Daily Living In your present state of health, do you have any difficulty  performing the following activities: 01/17/2017 01/24/2016  Hearing? Y N  Comment water in ears from [place] trip last week  -  Vision? N N  Difficulty concentrating or making decisions? Y N  Walking or climbing stairs? Y N  Comment gets dizzy -  Dressing or bathing? N N  Doing errands, shopping? N Y  Conservation officer, nature and eating ? N -  Using the Toilet? N -  In the past six months, have you accidently leaked urine? N -  Do you have problems with loss of bowel control? N -  Managing your Medications? N -  Managing your Finances? N -  Housekeeping or managing your Housekeeping? N -  Some recent data might be hidden    Immunizations and Health Maintenance Immunization History  Administered Date(s) Administered  . Tdap 08/02/2011   Health Maintenance Due  Topic Date Due  . INFLUENZA VACCINE   01/01/2017    Patient Care Team: Guadalupe Maple, MD as PCP - General (Unknown Physician Specialty)  Indicate any recent Medical Services you may have received from other than [facility] providers in the past year (date may be approximate).     Assessment:   This is a routine wellness examination for [name].   Hearing/Vision screen Vision Screening Comments: Goes to [facility] annually  Dietary issues and exercise activities discussed: Current Exercise Habits: Structured exercise class, Type of exercise: treadmill, Time (Minutes): 45, Frequency (Times/Week): 3, Weekly Exercise (Minutes/Week): 135, Intensity: Mild, Exercise limited by: cardiac condition(s)  Goals    None     Depression Screen PHQ 2/9 Scores 01/17/2017 07/27/2015  PHQ - 2 Score 1 0    Fall Risk Fall Risk  01/17/2017 07/30/2016  Falls in the past year? Yes Yes  Number falls in past yr: 1 1  Injury with Fall? No No  Risk for fall due to : - History of fall(s)    Cognitive Function:     6CIT Screen 01/17/2017  What Year? 0 points  What month? 0 points  What time? 0 points  Count back from 20 0 points  Months in reverse 0 points  Repeat phrase 2 points  Total Score 2    Screening Tests Health Maintenance  Topic Date Due  . INFLUENZA VACCINE  01/01/2017  . PAP SMEAR  02/27/2017 (Originally 12/14/2014)  . MAMMOGRAM  06/24/2017  . TETANUS/TDAP  08/01/2021  . HIV Screening  Completed      Plan:    I have personally reviewed and addressed the Medicare Annual Wellness questionnaire and have noted the following in the patient's chart:  A. Medical and social history B. Use of alcohol, tobacco or illicit drugs  C. Current medications and supplements D. Functional ability and status E.  Nutritional status F.  Physical activity G. Advance directives H. List of other physicians I.  Hospitalizations, surgeries, and ER visits in previous 12 months J.  [place] such as hearing and vision  if needed, cognitive and depression L. Referrals and appointments   In addition, I have reviewed and discussed with patient certain preventive protocols, quality metrics, and best practice recommendations. A written personalized care plan for preventive services as well as general preventive health recommendations were provided to patient.   Signed,  Tyler Aas, LPN Nurse Health Advisor   MD Recommendations: due for pap smear, patient complained of swimmers ear today. Spoke with Wynona Dove: advised patient to try equal parts of vinegar and rubbing alcohol and to call if it  worsens or persists.

## 2017-01-26 ENCOUNTER — Other Ambulatory Visit: Payer: Self-pay | Admitting: Family Medicine

## 2017-01-26 DIAGNOSIS — I951 Orthostatic hypotension: Principal | ICD-10-CM

## 2017-01-26 DIAGNOSIS — R000 Tachycardia, unspecified: Principal | ICD-10-CM

## 2017-01-26 DIAGNOSIS — G90A Postural orthostatic tachycardia syndrome (POTS): Secondary | ICD-10-CM

## 2017-02-06 ENCOUNTER — Encounter: Payer: Self-pay | Admitting: Family Medicine

## 2017-02-11 ENCOUNTER — Ambulatory Visit (INDEPENDENT_AMBULATORY_CARE_PROVIDER_SITE_OTHER): Payer: Medicare Other | Admitting: *Deleted

## 2017-02-11 DIAGNOSIS — I442 Atrioventricular block, complete: Secondary | ICD-10-CM

## 2017-02-11 NOTE — Progress Notes (Signed)
Remote pacemaker transmission.   

## 2017-02-12 ENCOUNTER — Encounter: Payer: Self-pay | Admitting: Cardiology

## 2017-02-12 LAB — CUP PACEART REMOTE DEVICE CHECK
Brady Statistic AP VS Percent: 0.02 %
Brady Statistic AS VP Percent: 86.84 %
Brady Statistic AS VS Percent: 0.05 %
Implantable Lead Implant Date: 20070319
Implantable Lead Location: 753859
Implantable Lead Location: 753860
Implantable Lead Model: 5076
Lead Channel Impedance Value: 285 Ohm
Lead Channel Pacing Threshold Pulse Width: 0.4 ms
Lead Channel Sensing Intrinsic Amplitude: 3.375 mV
Lead Channel Sensing Intrinsic Amplitude: 3.375 mV
Lead Channel Sensing Intrinsic Amplitude: 3.625 mV
Lead Channel Sensing Intrinsic Amplitude: 3.625 mV
Lead Channel Setting Pacing Amplitude: 2.5 V
Lead Channel Setting Pacing Pulse Width: 0.4 ms
MDC IDC LEAD IMPLANT DT: 20070319
MDC IDC MSMT BATTERY REMAINING LONGEVITY: 85 mo
MDC IDC MSMT BATTERY VOLTAGE: 3.01 V
MDC IDC MSMT LEADCHNL RA IMPEDANCE VALUE: 380 Ohm
MDC IDC MSMT LEADCHNL RA PACING THRESHOLD AMPLITUDE: 0.625 V
MDC IDC MSMT LEADCHNL RA PACING THRESHOLD PULSEWIDTH: 0.4 ms
MDC IDC MSMT LEADCHNL RV IMPEDANCE VALUE: 418 Ohm
MDC IDC MSMT LEADCHNL RV IMPEDANCE VALUE: 475 Ohm
MDC IDC MSMT LEADCHNL RV PACING THRESHOLD AMPLITUDE: 0.625 V
MDC IDC PG IMPLANT DT: 20160210
MDC IDC SESS DTM: 20180911114955
MDC IDC SET LEADCHNL RA PACING AMPLITUDE: 2 V
MDC IDC SET LEADCHNL RV SENSING SENSITIVITY: 2 mV
MDC IDC STAT BRADY AP VP PERCENT: 13.08 %
MDC IDC STAT BRADY RA PERCENT PACED: 13.09 %
MDC IDC STAT BRADY RV PERCENT PACED: 99.91 %

## 2017-02-14 ENCOUNTER — Other Ambulatory Visit: Payer: Self-pay | Admitting: Family Medicine

## 2017-02-14 DIAGNOSIS — F329 Major depressive disorder, single episode, unspecified: Secondary | ICD-10-CM

## 2017-02-14 DIAGNOSIS — F32A Depression, unspecified: Secondary | ICD-10-CM

## 2017-02-17 ENCOUNTER — Other Ambulatory Visit: Payer: Self-pay | Admitting: Family Medicine

## 2017-02-17 DIAGNOSIS — I951 Orthostatic hypotension: Principal | ICD-10-CM

## 2017-02-17 DIAGNOSIS — G90A Postural orthostatic tachycardia syndrome (POTS): Secondary | ICD-10-CM

## 2017-02-17 DIAGNOSIS — R000 Tachycardia, unspecified: Principal | ICD-10-CM

## 2017-02-27 ENCOUNTER — Encounter: Payer: Self-pay | Admitting: Family Medicine

## 2017-02-27 ENCOUNTER — Ambulatory Visit (INDEPENDENT_AMBULATORY_CARE_PROVIDER_SITE_OTHER): Payer: Medicare Other | Admitting: Family Medicine

## 2017-02-27 VITALS — BP 119/82 | HR 83 | Temp 97.7°F | Ht 64.0 in | Wt 173.4 lb

## 2017-02-27 DIAGNOSIS — N898 Other specified noninflammatory disorders of vagina: Secondary | ICD-10-CM

## 2017-02-27 DIAGNOSIS — F429 Obsessive-compulsive disorder, unspecified: Secondary | ICD-10-CM | POA: Diagnosis not present

## 2017-02-27 DIAGNOSIS — Z0000 Encounter for general adult medical examination without abnormal findings: Secondary | ICD-10-CM | POA: Diagnosis not present

## 2017-02-27 DIAGNOSIS — Z23 Encounter for immunization: Secondary | ICD-10-CM | POA: Diagnosis not present

## 2017-02-27 DIAGNOSIS — Z124 Encounter for screening for malignant neoplasm of cervix: Secondary | ICD-10-CM | POA: Diagnosis not present

## 2017-02-27 DIAGNOSIS — L298 Other pruritus: Secondary | ICD-10-CM

## 2017-02-27 DIAGNOSIS — I951 Orthostatic hypotension: Secondary | ICD-10-CM

## 2017-02-27 DIAGNOSIS — F3342 Major depressive disorder, recurrent, in full remission: Secondary | ICD-10-CM | POA: Diagnosis not present

## 2017-02-27 DIAGNOSIS — Z13220 Encounter for screening for lipoid disorders: Secondary | ICD-10-CM

## 2017-02-27 DIAGNOSIS — F331 Major depressive disorder, recurrent, moderate: Secondary | ICD-10-CM | POA: Diagnosis not present

## 2017-02-27 DIAGNOSIS — I4891 Unspecified atrial fibrillation: Secondary | ICD-10-CM | POA: Diagnosis not present

## 2017-02-27 DIAGNOSIS — R000 Tachycardia, unspecified: Secondary | ICD-10-CM

## 2017-02-27 DIAGNOSIS — G90A Postural orthostatic tachycardia syndrome (POTS): Secondary | ICD-10-CM

## 2017-02-27 DIAGNOSIS — I442 Atrioventricular block, complete: Secondary | ICD-10-CM

## 2017-02-27 LAB — URINALYSIS, ROUTINE W REFLEX MICROSCOPIC
BILIRUBIN UA: NEGATIVE
Glucose, UA: NEGATIVE
Ketones, UA: NEGATIVE
LEUKOCYTES UA: NEGATIVE
Nitrite, UA: NEGATIVE
PROTEIN UA: NEGATIVE
RBC, UA: NEGATIVE
SPEC GRAV UA: 1.015 (ref 1.005–1.030)
UUROB: 0.2 mg/dL (ref 0.2–1.0)
pH, UA: 7 (ref 5.0–7.5)

## 2017-02-27 LAB — MICROSCOPIC EXAMINATION
Bacteria, UA: NONE SEEN
RBC MICROSCOPIC, UA: NONE SEEN /HPF (ref 0–?)
WBC UA: NONE SEEN /HPF (ref 0–?)

## 2017-02-27 LAB — WET PREP FOR TRICH, YEAST, CLUE
Clue Cell Exam: POSITIVE — AB
Trichomonas Exam: NEGATIVE
Yeast Exam: NEGATIVE

## 2017-02-27 MED ORDER — LINACLOTIDE 72 MCG PO CAPS: 72.0000 ug | ORAL_CAPSULE | Freq: Every day | ORAL | 6 refills | Status: DC

## 2017-02-27 MED ORDER — METRONIDAZOLE 500 MG PO TABS: 500.0000 mg | ORAL_TABLET | Freq: Two times a day (BID) | ORAL | 0 refills | Status: DC

## 2017-02-27 MED ORDER — VENLAFAXINE HCL ER 150 MG PO CP24: 150.0000 mg | ORAL_CAPSULE | Freq: Every day | ORAL | 12 refills | Status: DC

## 2017-02-27 MED ORDER — MELOXICAM 15 MG PO TABS: 15.0000 mg | ORAL_TABLET | Freq: Every day | ORAL | 12 refills | Status: DC

## 2017-02-27 MED ORDER — LUBIPROSTONE 24 MCG PO CAPS: 24.0000 ug | ORAL_CAPSULE | Freq: Every day | ORAL | 6 refills | Status: DC

## 2017-02-27 MED ORDER — METOPROLOL TARTRATE 50 MG PO TABS: 50.0000 mg | ORAL_TABLET | Freq: Two times a day (BID) | ORAL | 12 refills | Status: DC

## 2017-02-27 NOTE — Patient Instructions (Addendum)

## 2017-02-27 NOTE — Assessment & Plan Note (Signed)
The current medical regimen is effective;  continue present plan and medications.  

## 2017-02-27 NOTE — Assessment & Plan Note (Signed)
Has pacemaker and doing well.

## 2017-02-27 NOTE — Progress Notes (Signed)
BP 119/82   Pulse 83   Temp 97.7 F (36.5 C)   Ht 5\' 4"  (1.626 m)   Wt 173 lb 6.4 oz (78.7 kg)   SpO2 98%   BMI 29.76 kg/m    Subjective:    Patient ID: Angela [name], female    DOB: April 16, 1971, 46 y.o.   MRN: [id number]  HPI: Angela [name] is a 46 y.o. female  Chief Complaint  Patient presents with  . Annual Exam   Patient all in all doing well for POTS doing well with pacemaker and energy is doing better. Constipation remains. Reviewed last colonoscopy in 2010 with recommendation for repeat in 5 years so we will go ahead and schedule. Patient doing okay with constipation medication alternates and varies from time to time. Nerves depression doing stable. Patient concerned about family history of Alzheimer's with grandmother with Alzheimer's and mother showing some signs and symptoms. Patient now having some word finding issues and forgetting things. Wants to be proactive.  Relevant past medical, surgical, family and social history reviewed and updated as indicated. Interim medical history since our last visit reviewed. Allergies and medications reviewed and updated.  Review of Systems  Constitutional: Negative.   HENT: Negative.   Eyes: Negative.   Respiratory: Negative.   Cardiovascular: Negative.   Gastrointestinal: Negative.   Endocrine: Negative.   Genitourinary: Negative.   Musculoskeletal: Negative.   Skin: Negative.   Allergic/Immunologic: Negative.   Neurological: Negative.   Hematological: Negative.   Psychiatric/Behavioral: Negative.     Per HPI unless specifically indicated above     Objective:    BP 119/82   Pulse 83   Temp 97.7 F (36.5 C)   Ht 5\' 4"  (1.626 m)   Wt 173 lb 6.4 oz (78.7 kg)   SpO2 98%   BMI 29.76 kg/m   Wt Readings from Last 3 Encounters:  02/27/17 173 lb 6.4 oz (78.7 kg)  01/17/17 174 lb 6.4 oz (79.1 kg)  08/13/16 176 lb 3.2 oz (79.9 kg)    Physical Exam  Constitutional: She is oriented to person, place, and  time. She appears well-developed and well-nourished.  HENT:  Head: Normocephalic and atraumatic.  Right Ear: External ear normal.  Left Ear: External ear normal.  Nose: Nose normal.  Mouth/Throat: Oropharynx is clear and moist.  Eyes: Pupils are equal, round, and reactive to light. Conjunctivae and EOM are normal.  Neck: Normal range of motion. Neck supple. Carotid bruit is not present.  Cardiovascular: Normal rate, regular rhythm and normal heart sounds.   No murmur heard. Pulmonary/Chest: Effort normal and breath sounds normal. She exhibits no mass. Right breast exhibits no mass, no skin change and no tenderness. Left breast exhibits no mass, no skin change and no tenderness. Breasts are symmetrical.  Abdominal: Soft. Bowel sounds are normal. There is no hepatosplenomegaly.  Genitourinary: No breast swelling or tenderness. There is no rash on the right labia. There is no rash on the left labia. Vaginal discharge found.  Genitourinary Comments: Wet prep positive for clue cells  Musculoskeletal: Normal range of motion.  Neurological: She is alert and oriented to person, place, and time.  Skin: No rash noted.  Psychiatric: She has a normal mood and affect. Her behavior is normal. Judgment and thought content normal.        Assessment & Plan:   Problem List Items Addressed This Visit      Cardiovascular and Mediastinum   AV BLOCK, COMPLETE    Has  pacemaker and doing well.      Relevant Medications   metoprolol tartrate (LOPRESSOR) 50 MG tablet   POTS (postural orthostatic tachycardia syndrome)    The current medical regimen is effective;  continue present plan and medications.       Relevant Medications   lubiprostone (AMITIZA) 24 MCG capsule   linaclotide (LINZESS) 72 MCG capsule   metoprolol tartrate (LOPRESSOR) 50 MG tablet   Other Relevant Orders   CBC with Differential/Platelet   Comprehensive metabolic panel   Urinalysis, Routine w reflex microscopic   Atrial  fibrillation (HCC)   Relevant Medications   metoprolol tartrate (LOPRESSOR) 50 MG tablet   Other Relevant Orders   CBC with Differential/Platelet   Comprehensive metabolic panel     Other   Depression    The current medical regimen is effective;  continue present plan and medications.       Relevant Medications   venlafaxine XR (EFFEXOR-XR) 150 MG 24 hr capsule   Other Relevant Orders   CBC with Differential/Platelet   Comprehensive metabolic panel   TSH   OCD (obsessive compulsive disorder)   Relevant Orders   Comprehensive metabolic panel   TSH    Other Visit Diagnoses    Need for influenza vaccination    -  Primary   Relevant Orders   Flu Vaccine QUAD 36+ mos IM (Completed)   Vaginal itching       Relevant Orders   WET PREP FOR [place], YEAST, CLUE   Screening for cervical cancer       Relevant Orders   Pap Lb, rfx HPV ASCU   Screening for cholesterol level       Relevant Orders   Lipid panel     Discussed bacterial vaginosis care and treatment use of Flagyl and no alcohol which is not an issue.  Follow up plan: Return in about 6 months (around 08/27/2017) for BMP.

## 2017-02-28 ENCOUNTER — Encounter: Payer: Self-pay | Admitting: Family Medicine

## 2017-02-28 LAB — COMPREHENSIVE METABOLIC PANEL
ALT: 31 IU/L (ref 0–32)
AST: 32 IU/L (ref 0–40)
Albumin/Globulin Ratio: 1.7 (ref 1.2–2.2)
Albumin: 4.3 g/dL (ref 3.5–5.5)
Alkaline Phosphatase: 86 IU/L (ref 39–117)
BUN/Creatinine Ratio: 13 (ref 9–23)
BUN: 11 mg/dL (ref 6–24)
Bilirubin Total: 0.4 mg/dL (ref 0.0–1.2)
CALCIUM: 9.6 mg/dL (ref 8.7–10.2)
CO2: 23 mmol/L (ref 20–29)
CREATININE: 0.82 mg/dL (ref 0.57–1.00)
Chloride: 101 mmol/L (ref 96–106)
GFR calc Af Amer: 100 mL/min/{1.73_m2} (ref >59)
GFR, EST NON AFRICAN AMERICAN: 87 mL/min/{1.73_m2} (ref >59)
GLOBULIN, TOTAL: 2.5 g/dL (ref 1.5–4.5)
GLUCOSE: 87 mg/dL (ref 65–99)
Potassium: 4.4 mmol/L (ref 3.5–5.2)
SODIUM: 138 mmol/L (ref 134–144)
Total Protein: 6.8 g/dL (ref 6.0–8.5)

## 2017-02-28 LAB — PAP LB, RFX HPV ASCU: PAP Smear Comment: 0

## 2017-02-28 LAB — LIPID PANEL
CHOL/HDL RATIO: 2.5 ratio (ref 0.0–4.4)
Cholesterol, Total: 178 mg/dL (ref 100–199)
HDL: 70 mg/dL (ref >39)
LDL Calculated: 95 mg/dL (ref 0–99)
Triglycerides: 66 mg/dL (ref 0–149)
VLDL Cholesterol Cal: 13 mg/dL (ref 5–40)

## 2017-02-28 LAB — CBC WITH DIFFERENTIAL/PLATELET
Basophils Absolute: 0 10*3/uL (ref 0.0–0.2)
Basos: 1 %
EOS (ABSOLUTE): 0 10*3/uL (ref 0.0–0.4)
EOS: 1 %
HEMATOCRIT: 44.2 % (ref 34.0–46.6)
HEMOGLOBIN: 14.4 g/dL (ref 11.1–15.9)
IMMATURE GRANULOCYTES: 0 %
Immature Grans (Abs): 0 10*3/uL (ref 0.0–0.1)
Lymphocytes Absolute: 2.1 10*3/uL (ref 0.7–3.1)
Lymphs: 32 %
MCH: 30.7 pg (ref 26.6–33.0)
MCHC: 32.6 g/dL (ref 31.5–35.7)
MCV: 94 fL (ref 79–97)
MONOCYTES: 7 %
MONOS ABS: 0.5 10*3/uL (ref 0.1–0.9)
NEUTROS PCT: 59 %
Neutrophils Absolute: 3.9 10*3/uL (ref 1.4–7.0)
Platelets: 296 10*3/uL (ref 150–379)
RBC: 4.69 x10E6/uL (ref 3.77–5.28)
RDW: 12.9 % (ref 12.3–15.4)
WBC: 6.5 10*3/uL (ref 3.4–10.8)

## 2017-02-28 LAB — TSH: TSH: 1.05 u[IU]/mL (ref 0.450–4.500)

## 2017-03-17 DIAGNOSIS — M545 Low back pain, unspecified: Secondary | ICD-10-CM | POA: Insufficient documentation

## 2017-03-22 ENCOUNTER — Other Ambulatory Visit: Payer: Self-pay | Admitting: Family Medicine

## 2017-03-22 DIAGNOSIS — F329 Major depressive disorder, single episode, unspecified: Secondary | ICD-10-CM

## 2017-03-22 DIAGNOSIS — F32A Depression, unspecified: Secondary | ICD-10-CM

## 2017-05-13 ENCOUNTER — Ambulatory Visit (INDEPENDENT_AMBULATORY_CARE_PROVIDER_SITE_OTHER): Payer: Medicare Other | Admitting: *Deleted

## 2017-05-13 DIAGNOSIS — I442 Atrioventricular block, complete: Secondary | ICD-10-CM

## 2017-05-13 NOTE — Progress Notes (Signed)
Remote pacemaker transmission.   

## 2017-05-15 LAB — CUP PACEART REMOTE DEVICE CHECK
Battery Voltage: 3.01 V
Brady Statistic AP VS Percent: 0.04 %
Brady Statistic AS VP Percent: 87.88 %
Brady Statistic AS VS Percent: 0.03 %
Brady Statistic RV Percent Paced: 99.91 %
Implantable Lead Implant Date: 20070319
Implantable Lead Location: 753859
Implantable Lead Location: 753860
Implantable Lead Model: 5076
Lead Channel Impedance Value: 304 Ohm
Lead Channel Pacing Threshold Pulse Width: 0.4 ms
Lead Channel Sensing Intrinsic Amplitude: 3.625 mV
Lead Channel Sensing Intrinsic Amplitude: 3.625 mV
Lead Channel Sensing Intrinsic Amplitude: 4 mV
Lead Channel Setting Pacing Amplitude: 2.5 V
Lead Channel Setting Pacing Pulse Width: 0.4 ms
MDC IDC LEAD IMPLANT DT: 20070319
MDC IDC MSMT BATTERY REMAINING LONGEVITY: 83 mo
MDC IDC MSMT LEADCHNL RA IMPEDANCE VALUE: 399 Ohm
MDC IDC MSMT LEADCHNL RA PACING THRESHOLD AMPLITUDE: 0.625 V
MDC IDC MSMT LEADCHNL RA PACING THRESHOLD PULSEWIDTH: 0.4 ms
MDC IDC MSMT LEADCHNL RA SENSING INTR AMPL: 4 mV
MDC IDC MSMT LEADCHNL RV IMPEDANCE VALUE: 418 Ohm
MDC IDC MSMT LEADCHNL RV IMPEDANCE VALUE: 475 Ohm
MDC IDC MSMT LEADCHNL RV PACING THRESHOLD AMPLITUDE: 0.625 V
MDC IDC PG IMPLANT DT: 20160210
MDC IDC SESS DTM: 20181211183404
MDC IDC SET LEADCHNL RA PACING AMPLITUDE: 2 V
MDC IDC SET LEADCHNL RV SENSING SENSITIVITY: 2 mV
MDC IDC STAT BRADY AP VP PERCENT: 12.06 %
MDC IDC STAT BRADY RA PERCENT PACED: 12.08 %

## 2017-05-16 ENCOUNTER — Encounter: Payer: Self-pay | Admitting: Cardiology

## 2017-06-24 ENCOUNTER — Other Ambulatory Visit: Payer: Self-pay | Admitting: Family Medicine

## 2017-06-24 DIAGNOSIS — Z1231 Encounter for screening mammogram for malignant neoplasm of breast: Secondary | ICD-10-CM

## 2017-06-30 ENCOUNTER — Ambulatory Visit: Payer: Medicare Other | Admitting: Family Medicine

## 2017-07-11 ENCOUNTER — Ambulatory Visit
Admission: RE | Admit: 2017-07-11 | Discharge: 2017-07-11 | Disposition: A | Discharge status: Home / Self Care | Payer: Medicare Other | Source: Ambulatory Visit | Attending: Family Medicine | Admitting: Family Medicine

## 2017-07-11 DIAGNOSIS — Z1231 Encounter for screening mammogram for malignant neoplasm of breast: Secondary | ICD-10-CM

## 2017-07-11 IMAGING — MG MM DIGITAL SCREENING BILAT W/ TOMO W/ CAD
8 of 12 series · 8 of 28 positions shown · non-contrast
Comparison: Previous exam(s).

CLINICAL DATA: Screening.

EXAM:
DIGITAL SCREENING BILATERAL MAMMOGRAM WITH TOMO AND CAD

[R CC]
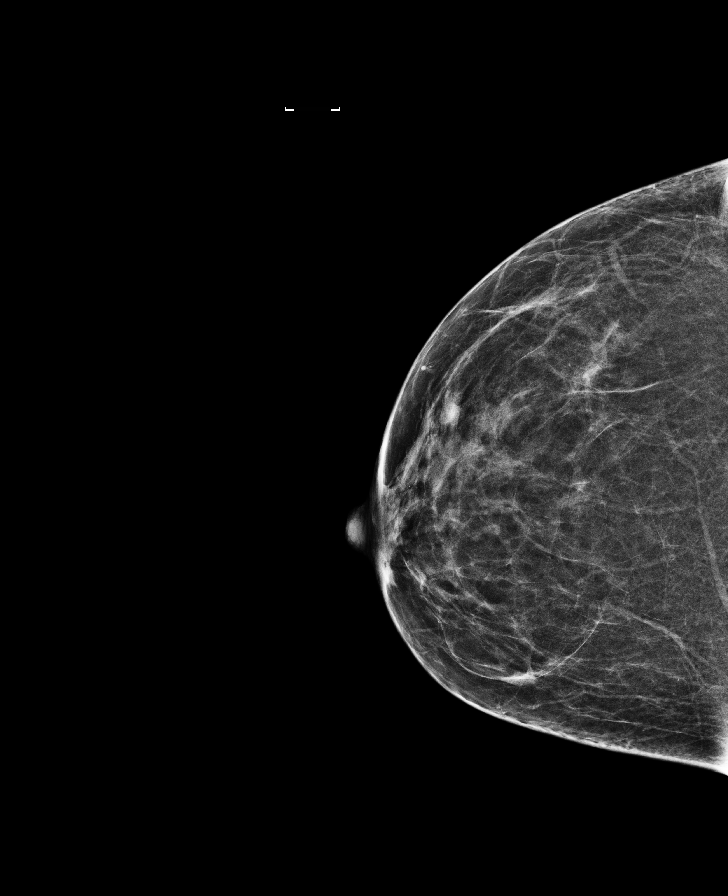

[R MLO synth-2D]
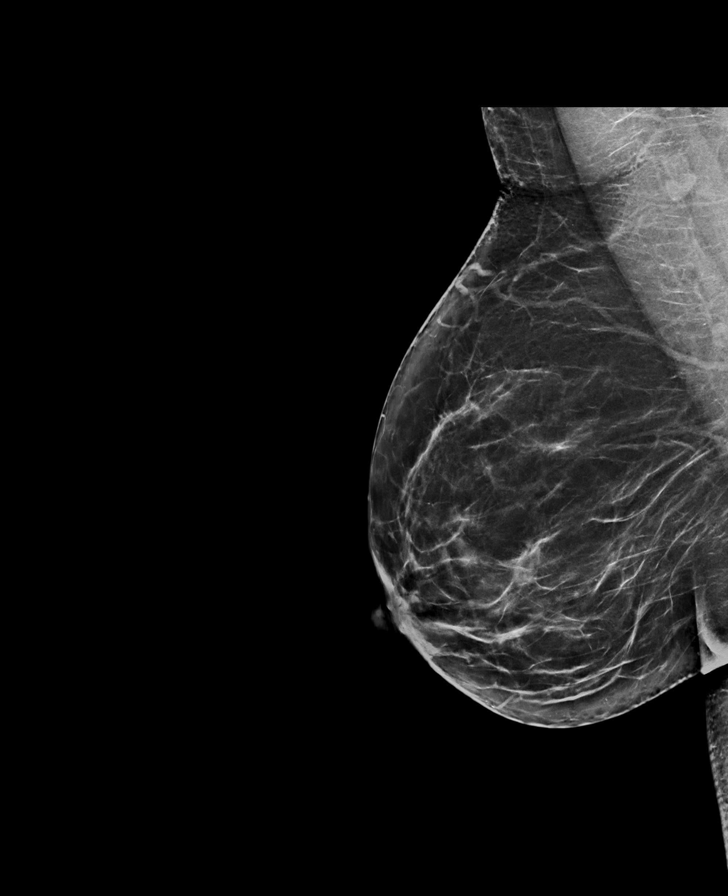

[R MLO]
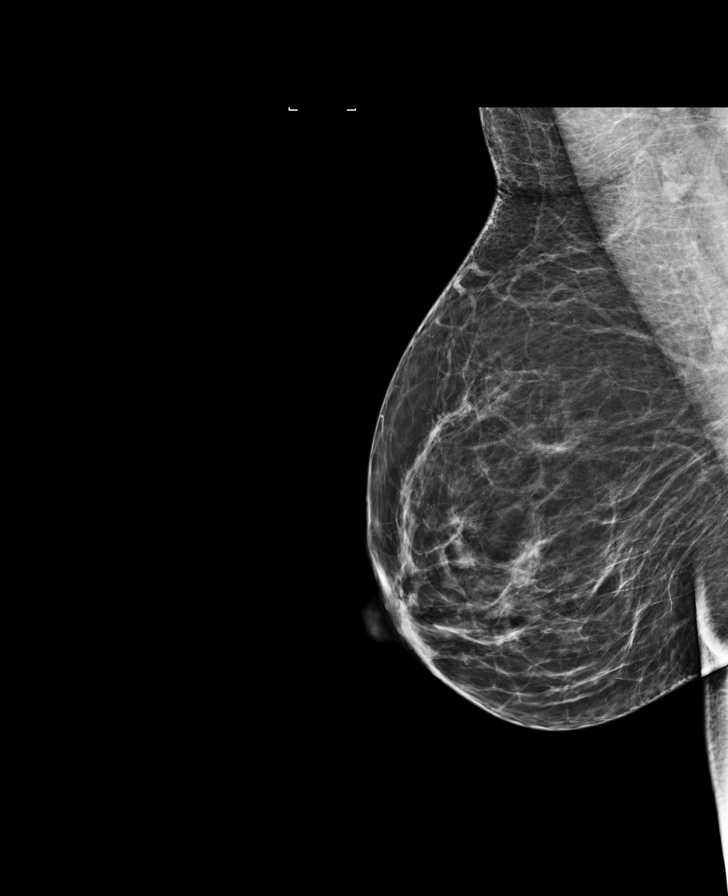

[L MLO synth-2D]
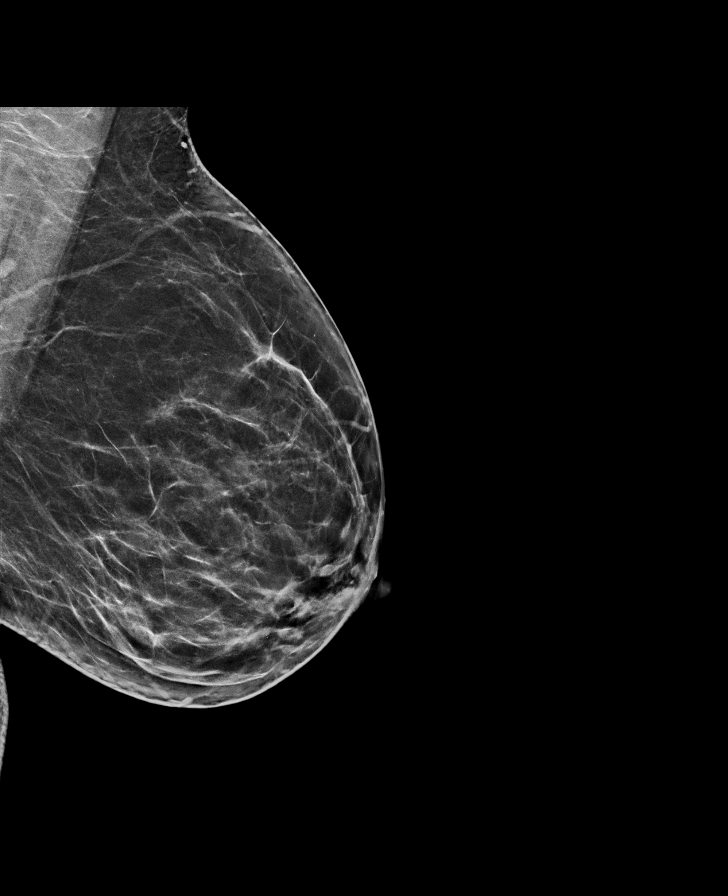

[L MLO]
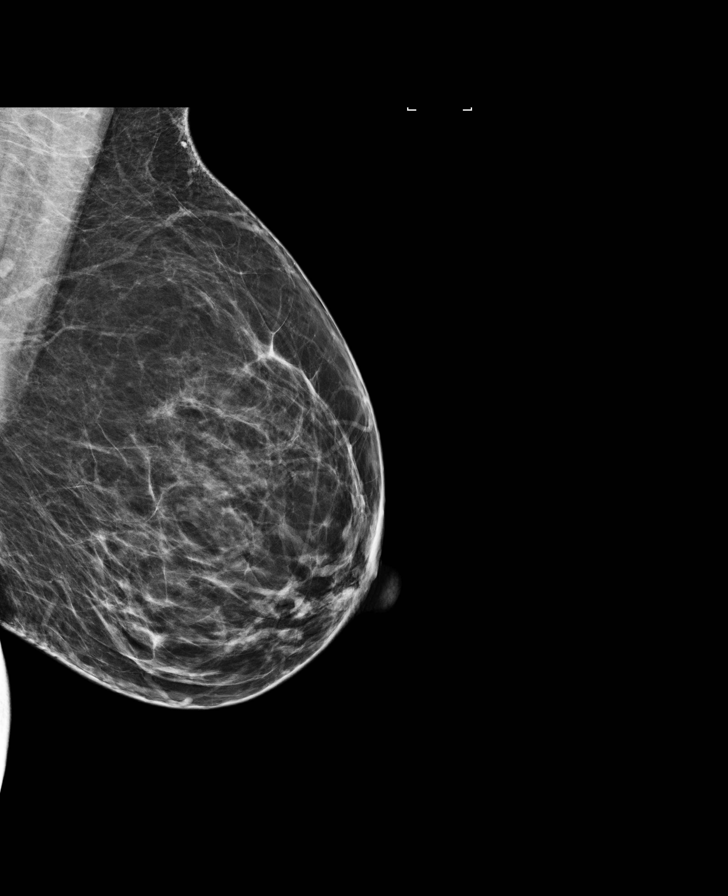

[R CC synth-2D]
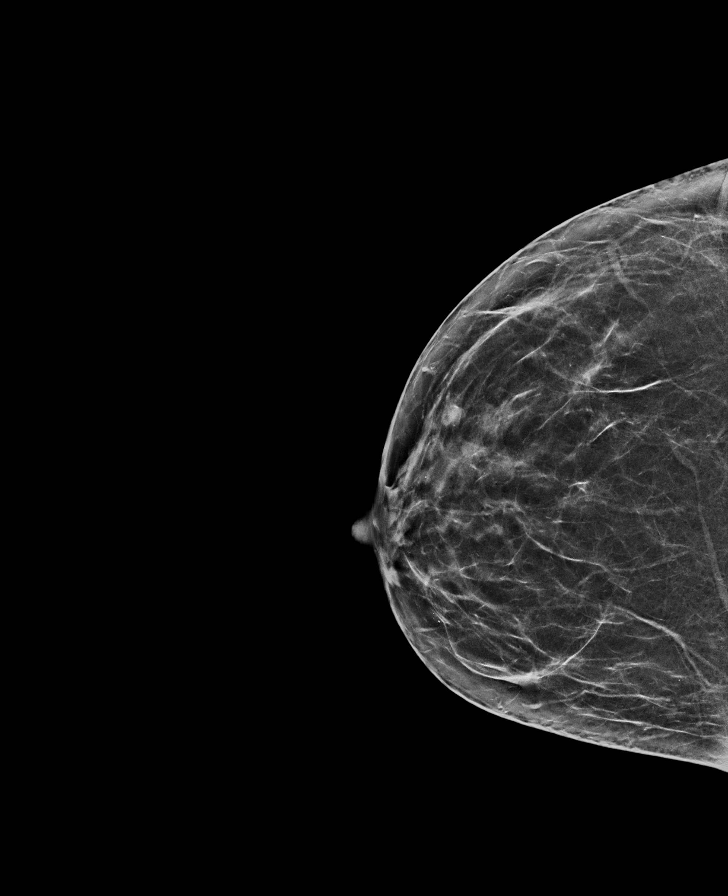

[L CC synth-2D]
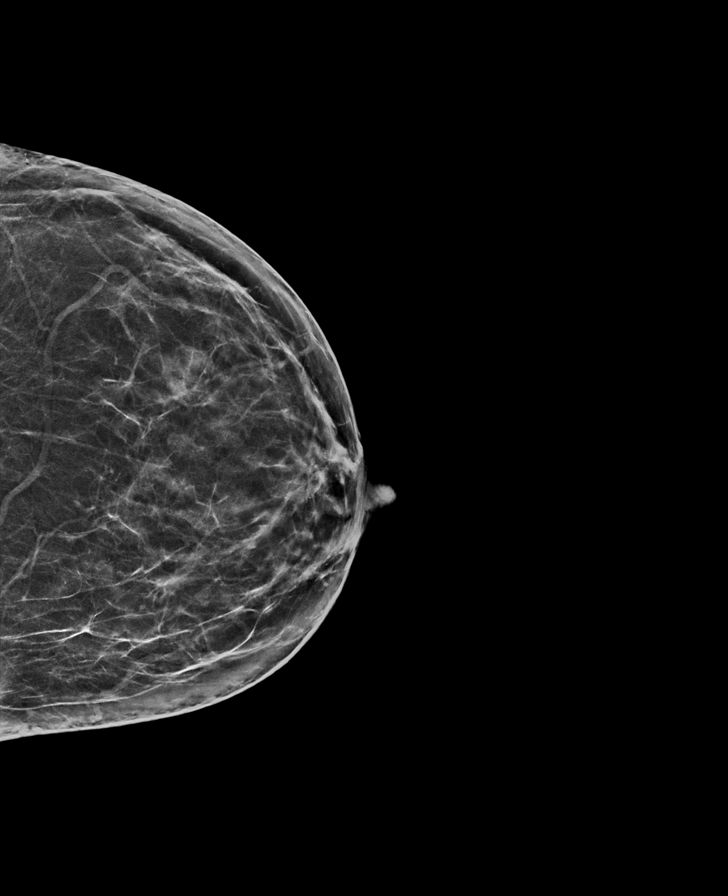

[L CC]
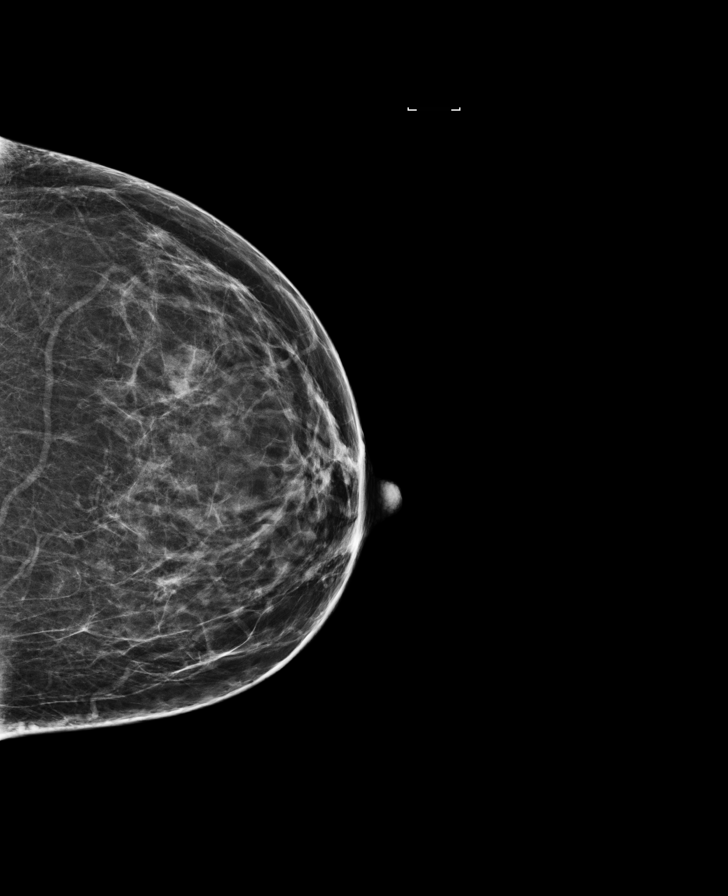

[8 of 28 positions shown; findings below may reference images not displayed]

ACR Breast Density Category b: There are scattered areas of
fibroglandular density.
FINDINGS: There are no findings suspicious for malignancy. Images were
processed with CAD.
IMPRESSION: No mammographic evidence of malignancy. A result letter of this
screening mammogram will be mailed directly to the patient.

RECOMMENDATION:
Screening mammogram in one year. (Code:[TQ])

BI-RADS CATEGORY  1: Negative.

## 2017-08-12 ENCOUNTER — Telehealth: Payer: Self-pay | Admitting: Cardiology

## 2017-08-12 ENCOUNTER — Ambulatory Visit (INDEPENDENT_AMBULATORY_CARE_PROVIDER_SITE_OTHER): Payer: Medicare Other | Admitting: *Deleted

## 2017-08-12 DIAGNOSIS — I442 Atrioventricular block, complete: Secondary | ICD-10-CM | POA: Diagnosis not present

## 2017-08-12 NOTE — Progress Notes (Signed)
Remote pacemaker transmission.   

## 2017-08-12 NOTE — Telephone Encounter (Signed)
Spoke with pt and reminded pt of remote transmission that is due today. Pt verbalized understanding.   

## 2017-08-13 ENCOUNTER — Encounter: Payer: Self-pay | Admitting: Cardiology

## 2017-08-14 LAB — CUP PACEART REMOTE DEVICE CHECK
Battery Voltage: 3.01 V
Brady Statistic AP VP Percent: 13.39 %
Brady Statistic AS VS Percent: 0.03 %
Brady Statistic RA Percent Paced: 13.42 %
Brady Statistic RV Percent Paced: 99.89 %
Implantable Lead Implant Date: 20070319
Implantable Lead Implant Date: 20070319
Implantable Lead Location: 753860
Implantable Pulse Generator Implant Date: 20160210
Lead Channel Impedance Value: 418 Ohm
Lead Channel Impedance Value: 437 Ohm
Lead Channel Impedance Value: 494 Ohm
Lead Channel Pacing Threshold Amplitude: 0.625 V
Lead Channel Pacing Threshold Pulse Width: 0.4 ms
Lead Channel Setting Pacing Amplitude: 2 V
Lead Channel Setting Sensing Sensitivity: 2 mV
MDC IDC LEAD LOCATION: 753859
MDC IDC MSMT BATTERY REMAINING LONGEVITY: 80 mo
MDC IDC MSMT LEADCHNL RA IMPEDANCE VALUE: 323 Ohm
MDC IDC MSMT LEADCHNL RA SENSING INTR AMPL: 4 mV
MDC IDC MSMT LEADCHNL RA SENSING INTR AMPL: 4 mV
MDC IDC MSMT LEADCHNL RV PACING THRESHOLD AMPLITUDE: 0.625 V
MDC IDC MSMT LEADCHNL RV PACING THRESHOLD PULSEWIDTH: 0.4 ms
MDC IDC MSMT LEADCHNL RV SENSING INTR AMPL: 3.75 mV
MDC IDC MSMT LEADCHNL RV SENSING INTR AMPL: 3.75 mV
MDC IDC SESS DTM: 20190312140120
MDC IDC SET LEADCHNL RV PACING AMPLITUDE: 2.5 V
MDC IDC SET LEADCHNL RV PACING PULSEWIDTH: 0.4 ms
MDC IDC STAT BRADY AP VS PERCENT: 0.05 %
MDC IDC STAT BRADY AS VP PERCENT: 86.52 %

## 2017-08-20 NOTE — Progress Notes (Signed)
Electrophysiology Office Note Date: 08/22/2017  ID:  Samoria, Fedorko [birth year], MRN [id number]  PCP: Guadalupe Maple, MD Electrophysiologist: Caryl Comes  CC: Pacemaker follow-up  NEKISHA MCDIARMID is a 47 y.o. female seen today for Dr Caryl Comes.  She presents today for routine electrophysiology followup.  Since last being seen in our clinic, the patient reports doing very well.  She remains very active exercising 5 days per week. Her son got a full scholarship to [place]. Her daughter is in a Therapist, occupational and wants to be a Medical illustrator.  She denies dyspnea, PND, orthopnea, nausea, vomiting, dizziness, syncope, edema.  Device History: MDT dual chamber PPM implanted 2007 for complete heart block; gen change 2016   Past Medical History:  Diagnosis Date  . Chest pain    a cath 2/10. EF 50-55% normal coronaries  . Complete heart block ([place])   . Depression   . Dysautonomia ([place])   . Dyspnea    CT negative for PE, 2007  . Hypertension   . Insomnia   . OCD (obsessive compulsive disorder)   . SVT (supraventricular tachycardia) (HCC)    s/p ablation a. c/b AV nod ablation requiring pacemaker   Past Surgical History:  Procedure Laterality Date  . BREAST BIOPSY Right 1998  . BREAST BIOPSY Right 2004  . BREAST LUMPECTOMY     RIGHT -BREAST   . ENDOMETRIAL ABLATION  2005  . PELVIC LAPAROSCOPY  1990  . PERMANENT PACEMAKER GENERATOR CHANGE N/A 07/13/2014   MDT MRI compatible dual chamber pacemaker implanted by Dr Caryl Comes  . SPARK  2005  . TUBAL LIGATION  2003    Current Outpatient Medications  Medication Sig Dispense Refill  . linaclotide (LINZESS) 72 MCG capsule Take 1 capsule (72 mcg total) by mouth daily. 30 capsule 6  . LORazepam (ATIVAN) 0.5 MG tablet Take 1 tablet (0.5 mg total) by mouth daily as needed for anxiety. 30 tablet 0  . lubiprostone (AMITIZA) 24 MCG capsule Take 1 capsule (24 mcg total) by mouth daily with breakfast. 30 capsule 6  . meloxicam (MOBIC) 15 MG  tablet Take 1 tablet (15 mg total) by mouth daily. 30 tablet 12  . methocarbamol (ROBAXIN) 500 MG tablet TK 1 TO 2 TS PO Q 6 H PRN P  0  . metoprolol tartrate (LOPRESSOR) 50 MG tablet Take 1 tablet (50 mg total) by mouth 2 (two) times daily. 60 tablet 12  . Multiple Vitamins-Minerals (MULTIVITAMINS THER. W/MINERALS) TABS Take 1 tablet by mouth daily.    Marland Kitchen venlafaxine XR (EFFEXOR-XR) 150 MG 24 hr capsule Take 1 capsule (150 mg total) by mouth daily with breakfast. 30 capsule 12   No current facility-administered medications for this visit.     Allergies:   Toprol xl [metoprolol succinate]   Social History: Social History   Socioeconomic History  . Marital status: Married    Spouse name: Not on file  . Number of children: Not on file  . Years of education: Not on file  . Highest education level: Not on file  Occupational History  . Not on file  Social Needs  . Financial resource strain: Not on file  . Food insecurity:    Worry: Not on file    Inability: Not on file  . Transportation needs:    Medical: Not on file    Non-medical: Not on file  Tobacco Use  . Smoking status: Never Smoker  . Smokeless tobacco: Never Used  Substance and Sexual Activity  .  Alcohol use: No  . Drug use: No  . Sexual activity: Yes    Birth control/protection: Surgical  Lifestyle  . Physical activity:    Days per week: Not on file    Minutes per session: Not on file  . Stress: Not on file  Relationships  . Social connections:    Talks on phone: Not on file    Gets together: Not on file    Attends religious service: Not on file    Active member of club or organization: Not on file    Attends meetings of clubs or organizations: Not on file    Relationship status: Not on file  . Intimate partner violence:    Fear of current or ex partner: Not on file    Emotionally abused: Not on file    Physically abused: Not on file    Forced sexual activity: Not on file  Other Topics Concern  . Not on  file  Social History Narrative  . Not on file    Family History: Family History  Problem Relation Age of Onset  . Hypertension Father   . Cancer Father 88       COLON  . Diabetes Father   . Breast cancer Paternal Aunt   . Hypertension Maternal Grandfather   . Diabetes Paternal Grandmother   . Breast cancer Paternal Grandmother   . Diabetes Paternal Grandfather   . Breast cancer Maternal Aunt        mastectomy  . Dementia Maternal Grandmother   . Breast cancer Paternal Aunt      Review of Systems: All other systems reviewed and are otherwise negative except as noted above.   Physical Exam: VS:  BP 118/78 (BP Location: Left Arm, Patient Position: Sitting, Cuff Size: Normal)   Pulse 80   Ht 5\' 4"  (1.626 m)   Wt 169 lb (76.7 kg)   SpO2 97%   BMI 29.01 kg/m  , BMI Body mass index is 29.01 kg/m.  GEN- The patient is well appearing, alert and oriented x 3 today.   HEENT: normocephalic, atraumatic; sclera clear, conjunctiva pink; hearing intact; oropharynx clear; neck supple  Lungs- Clear to ausculation bilaterally, normal work of breathing.  No wheezes, rales, rhonchi Heart- Regular rate and rhythm (paced) GI- soft, non-tender, non-distended, bowel sounds present  Extremities- no clubbing, cyanosis, or edema  MS- no significant deformity or atrophy Skin- warm and dry, no rash or lesion; PPM pocket well healed Psych- euthymic mood, full affect Neuro- strength and sensation are intact  PPM Interrogation- reviewed in detail today,  See PACEART report  EKG:  EKG is not ordered today.  Recent Labs: 02/27/2017: ALT 31; BUN 11; Creatinine, Ser 0.82; Hemoglobin 14.4; Platelets 296; Potassium 4.4; Sodium 138; TSH 1.050   Wt Readings from Last 3 Encounters:  08/22/17 169 lb (76.7 kg)  02/27/17 173 lb 6.4 oz (78.7 kg)  01/17/17 174 lb 6.4 oz (79.1 kg)     Other studies Reviewed: Additional studies/ records that were reviewed today include: Dr Olin Pia office notes    Assessment and Plan:  1.  Complete heart block Normal PPM function See Pace Art report No changes today  2.  POTS Stable No change required today  3.  Atrial tachycardia Burden by device interrogation today 0.1%, all atrial rates <175 Asymptomatic No change at this time   Current medicines are reviewed at length with the patient today.   The patient does not have concerns regarding her medicines.  The  following changes were made today:  none  Labs/ tests ordered today include: none    Disposition:   Follow up with [facility] transmissions, Dr Caryl Comes 1 year      Signed, Chanetta Marshall, NP 08/22/2017 10:26 AM  [facility] [address] [place] [address] [phone] (office) [phone] (fax)

## 2017-08-22 ENCOUNTER — Encounter: Payer: Self-pay | Admitting: Nurse Practitioner

## 2017-08-22 ENCOUNTER — Ambulatory Visit: Payer: Medicare Other | Admitting: Nurse Practitioner

## 2017-08-22 VITALS — BP 118/78 | HR 80 | Ht 64.0 in | Wt 169.0 lb

## 2017-08-22 DIAGNOSIS — I4891 Unspecified atrial fibrillation: Secondary | ICD-10-CM

## 2017-08-22 DIAGNOSIS — I471 Supraventricular tachycardia: Secondary | ICD-10-CM | POA: Diagnosis not present

## 2017-08-22 DIAGNOSIS — I442 Atrioventricular block, complete: Secondary | ICD-10-CM

## 2017-08-22 DIAGNOSIS — G901 Familial dysautonomia [Riley-Day]: Secondary | ICD-10-CM

## 2017-08-22 NOTE — Patient Instructions (Addendum)
Medication Instructions:   Your physician recommends that you continue on your current medications as directed. Please refer to the Current Medication list given to you today.   If you need a refill on your cardiac medications before your next appointment, please call your pharmacy.  Labwork: NONE ORDERED  TODAY   Testing/Procedures: NONE ORDERED  TODAY    Follow-Up:  Your physician wants you to follow-up in: [place] will receive a reminder letter in the mail two months in advance. If you don't receive a letter, please call our office to schedule the follow-up appointment.  Remote monitoring is used to monitor your Pacemaker of ICD from home. This monitoring reduces the number of office visits required to check your device to one time per year. It allows [place] to keep an eye on the functioning of your device to ensure it is working properly. You are scheduled for a device check from home on . 11-11-17  You may send your transmission at any time that day. If you have a wireless device, the transmission will be sent automatically. After your physician reviews your transmission, you will receive a postcard with your next transmission date.     Any Other Special Instructions Will Be Listed Below (If Applicable).

## 2017-08-25 LAB — CUP PACEART INCLINIC DEVICE CHECK
Date Time Interrogation Session: 20190325102752
Implantable Lead Implant Date: 20070319
Implantable Lead Location: 753859
Implantable Lead Model: 5076
Implantable Pulse Generator Implant Date: 20160210
MDC IDC LEAD IMPLANT DT: 20070319
MDC IDC LEAD LOCATION: 753860

## 2017-08-27 ENCOUNTER — Ambulatory Visit (INDEPENDENT_AMBULATORY_CARE_PROVIDER_SITE_OTHER): Payer: Medicare Other | Admitting: Family Medicine

## 2017-08-27 ENCOUNTER — Encounter: Payer: Self-pay | Admitting: Family Medicine

## 2017-08-27 VITALS — BP 113/73 | HR 68 | Ht 64.0 in | Wt 171.0 lb

## 2017-08-27 DIAGNOSIS — F331 Major depressive disorder, recurrent, moderate: Secondary | ICD-10-CM

## 2017-08-27 DIAGNOSIS — Z800 Family history of malignant neoplasm of digestive organs: Secondary | ICD-10-CM

## 2017-08-27 DIAGNOSIS — R000 Tachycardia, unspecified: Secondary | ICD-10-CM | POA: Diagnosis not present

## 2017-08-27 DIAGNOSIS — I951 Orthostatic hypotension: Secondary | ICD-10-CM | POA: Diagnosis not present

## 2017-08-27 DIAGNOSIS — G90A Postural orthostatic tachycardia syndrome (POTS): Secondary | ICD-10-CM

## 2017-08-27 NOTE — Assessment & Plan Note (Signed)
The current medical regimen is effective;  continue present plan and medications.  

## 2017-08-27 NOTE — Progress Notes (Signed)
BP 113/73   Pulse 68   Ht 5\' 4"  (1.626 m)   Wt 171 lb (77.6 kg)   SpO2 98%   BMI 29.35 kg/m    Subjective:    Patient ID: Angela [name], female    DOB: 04-24-1971, 47 y.o.   MRN: [id number]  HPI: Angela [name] is a 47 y.o. female  Chief Complaint  Patient presents with  . Follow-up   Patient all in all doing well good reports from cardiology had pacemaker checked and is doing well with some slight adjustments. Constipation doing okay with alternating Amitiza and Lizness. Very rare use of lorazepam. Takes metoprolol for cardiac stability and does well. Effexor 150s does okay has been on this long-term and good control. Has been continuing to work out and doing well with good weight control. Has noted in the last 6-8 weeks or so intermittently will wake up with a strong burning type smell.  Usually just 1 time a night and not every night. No reported sleep apnea type symptoms but will get husband to observe. No other sleep disorder noted.  Relevant past medical, surgical, family and social history reviewed and updated as indicated. Interim medical history since our last visit reviewed. Allergies and medications reviewed and updated.  Review of Systems  Constitutional: Negative.   Respiratory: Negative.   Cardiovascular: Negative.     Per HPI unless specifically indicated above     Objective:    BP 113/73   Pulse 68   Ht 5\' 4"  (1.626 m)   Wt 171 lb (77.6 kg)   SpO2 98%   BMI 29.35 kg/m   Wt Readings from Last 3 Encounters:  08/27/17 171 lb (77.6 kg)  08/22/17 169 lb (76.7 kg)  02/27/17 173 lb 6.4 oz (78.7 kg)    Physical Exam  Constitutional: She is oriented to person, place, and time. She appears well-developed and well-nourished.  HENT:  Head: Normocephalic and atraumatic.  Eyes: Conjunctivae and EOM are normal.  Neck: Normal range of motion.  Cardiovascular: Normal rate, regular rhythm and normal heart sounds.  Pulmonary/Chest: Effort normal  and breath sounds normal.  Musculoskeletal: Normal range of motion.  Neurological: She is alert and oriented to person, place, and time.  Skin: No erythema.  Psychiatric: She has a normal mood and affect. Her behavior is normal. Judgment and thought content normal.    Results for orders placed or performed in visit on 08/22/17  [facility] [employer]  Result Value Ref Range   Pulse Generator Manufacturer MERM    Date Time Interrogation Session [phone]    Pulse Gen Model [id number] Advisa DR MRI    Pulse Gen Serial Number [id number] [facility] [place] [facility]    Implantable Pulse Generator Type Implantable Pulse Generator    Implantable Pulse Generator Implant Date 84132440    Implantable Lead Manufacturer [facility]    Implantable Lead Model 5076 CapSureFix Novus    Implantable Lead Serial Number [id number]    Implantable Lead Implant Date 10272536    Implantable Lead Location Detail 1 APPENDAGE    Implantable Lead Location [id number]    Implantable Lead Manufacturer [facility]    Implantable Lead Model 5076 CapSureFix Novus    Implantable Lead Serial Number [id number]    Implantable Lead Implant Date 64403474    Implantable Lead Location Detail 1 APEX    Implantable Lead Location [id number]       Assessment & Plan:   Problem List Items Addressed  This Visit      Cardiovascular and Mediastinum   POTS (postural orthostatic tachycardia syndrome) - Primary    The current medical regimen is effective;  continue present plan and medications.       Relevant Orders   Basic metabolic panel     Other   Depression    The current medical regimen is effective;  continue present plan and medications.        Other Visit Diagnoses    Family history of colon cancer       Relevant Orders   Ambulatory referral to Gastroenterology   Family history of malignant neoplasm of colon in father       Relevant Orders   Ambulatory referral to Gastroenterology    For smell sensation  will try Flonase at nighttime observe for sleep apnea type symptoms and further evaluate as appropriate.  Follow up plan: Return in about 6 months (around 02/27/2018) for Physical Exam.

## 2017-08-29 ENCOUNTER — Telehealth: Payer: Self-pay

## 2017-08-29 ENCOUNTER — Other Ambulatory Visit: Payer: Self-pay

## 2017-08-29 DIAGNOSIS — Z1211 Encounter for screening for malignant neoplasm of colon: Secondary | ICD-10-CM

## 2017-08-29 NOTE — Telephone Encounter (Signed)
Gastroenterology Pre-Procedure Review  Request Date:  Requesting Physician: Dr.   PATIENT REVIEW QUESTIONS: The patient responded to the following health history questions as indicated:    1. Are you having any GI issues? yes (constipation) 2. Do you have a personal history of Polyps? no 3. Do you have a family history of Colon Cancer or Polyps? yes (Father, colon cancer age 47) 30. Diabetes Mellitus? no 5. Joint replacements in the past 12 months?no 6. Major health problems in the past 3 months?no 7. Any artificial heart valves, MVP, or defibrillator?yes (pacemaker)    MEDICATIONS & ALLERGIES:    Patient reports the following regarding taking any anticoagulation/antiplatelet therapy:   Plavix, Coumadin, Eliquis, Xarelto, Lovenox, Pradaxa, Brilinta, or Effient? no Aspirin? no  Patient confirms/reports the following medications:  Current Outpatient Medications  Medication Sig Dispense Refill  . linaclotide (LINZESS) 72 MCG capsule Take 1 capsule (72 mcg total) by mouth daily. 30 capsule 6  . LORazepam (ATIVAN) 0.5 MG tablet Take 1 tablet (0.5 mg total) by mouth daily as needed for anxiety. 30 tablet 0  . lubiprostone (AMITIZA) 24 MCG capsule Take 1 capsule (24 mcg total) by mouth daily with breakfast. 30 capsule 6  . meloxicam (MOBIC) 15 MG tablet Take 1 tablet (15 mg total) by mouth daily. 30 tablet 12  . metoprolol tartrate (LOPRESSOR) 50 MG tablet Take 1 tablet (50 mg total) by mouth 2 (two) times daily. 60 tablet 12  . Multiple Vitamins-Minerals (MULTIVITAMINS THER. W/MINERALS) TABS Take 1 tablet by mouth daily.    Marland Kitchen venlafaxine XR (EFFEXOR-XR) 150 MG 24 hr capsule Take 1 capsule (150 mg total) by mouth daily with breakfast. 30 capsule 12   No current facility-administered medications for this visit.     Patient confirms/reports the following allergies:  Allergies  Allergen Reactions  . Toprol Xl [Metoprolol Succinate]     No orders of the defined types were placed in this  encounter.   AUTHORIZATION INFORMATION Primary Insurance: 1D#: Group #:  Secondary Insurance: 1D#: Group #:  SCHEDULE INFORMATION: Date: 09/15/17 Time: Location: [facility]

## 2017-09-12 ENCOUNTER — Telehealth: Payer: Self-pay | Admitting: Gastroenterology

## 2017-09-12 NOTE — Telephone Encounter (Signed)
Angela [name]  From [facility] left vm to inform us pt has been leaving [facility] messages to cancel her procedure  Please call pt  And Angela [name] at [phone]

## 2017-09-12 NOTE — Telephone Encounter (Signed)
LVM for [facility] explaining that the patient's procedure has been canceled as requested.   Patient states she was leaving messages with [facility] and not [facility]  Patient will callback to reschedule in a few weeks due to family emergency.

## 2017-09-15 ENCOUNTER — Ambulatory Visit: Admission: RE | Admit: 2017-09-15 | Payer: Medicare Other | Source: Ambulatory Visit | Admitting: Gastroenterology

## 2017-09-15 ENCOUNTER — Encounter: Admission: RE | Payer: Self-pay | Source: Ambulatory Visit

## 2017-09-15 SURGERY — COLONOSCOPY WITH PROPOFOL
Anesthesia: General

## 2017-09-30 ENCOUNTER — Other Ambulatory Visit: Payer: Self-pay | Admitting: Family Medicine

## 2017-09-30 DIAGNOSIS — I951 Orthostatic hypotension: Principal | ICD-10-CM

## 2017-09-30 DIAGNOSIS — G90A Postural orthostatic tachycardia syndrome (POTS): Secondary | ICD-10-CM

## 2017-09-30 DIAGNOSIS — R000 Tachycardia, unspecified: Principal | ICD-10-CM

## 2017-10-27 ENCOUNTER — Other Ambulatory Visit: Payer: Self-pay | Admitting: Family Medicine

## 2017-10-27 DIAGNOSIS — G90A Postural orthostatic tachycardia syndrome (POTS): Secondary | ICD-10-CM

## 2017-10-27 DIAGNOSIS — I951 Orthostatic hypotension: Principal | ICD-10-CM

## 2017-10-27 DIAGNOSIS — R000 Tachycardia, unspecified: Principal | ICD-10-CM

## 2017-10-30 NOTE — Telephone Encounter (Signed)
LOV  08/27/17 Dr. Jeananne Rama Last refill 09/30/17  # 30 0 refill

## 2017-11-11 ENCOUNTER — Ambulatory Visit (INDEPENDENT_AMBULATORY_CARE_PROVIDER_SITE_OTHER): Payer: Medicare Other | Admitting: *Deleted

## 2017-11-11 ENCOUNTER — Telehealth: Payer: Self-pay

## 2017-11-11 DIAGNOSIS — I442 Atrioventricular block, complete: Secondary | ICD-10-CM | POA: Diagnosis not present

## 2017-11-11 NOTE — Telephone Encounter (Signed)
Spoke with pt and reminded pt of remote transmission that is due today. Pt verbalized understanding.   

## 2017-11-12 ENCOUNTER — Encounter: Payer: Self-pay | Admitting: Cardiology

## 2017-11-12 NOTE — Progress Notes (Signed)
Remote pacemaker transmission.   

## 2017-11-14 LAB — CUP PACEART REMOTE DEVICE CHECK
Battery Remaining Longevity: 76 mo
Battery Voltage: 3 V
Brady Statistic AP VP Percent: 14.06 %
Brady Statistic AP VS Percent: 0.06 %
Brady Statistic AS VP Percent: 85.85 %
Brady Statistic AS VS Percent: 0.04 %
Brady Statistic RA Percent Paced: 14.09 %
Brady Statistic RV Percent Paced: 99.89 %
Date Time Interrogation Session: 20190611192706
Implantable Lead Implant Date: 20070319
Implantable Lead Implant Date: 20070319
Implantable Lead Location: 753859
Implantable Lead Location: 753860
Implantable Lead Model: 5076
Implantable Lead Model: 5076
Implantable Pulse Generator Implant Date: 20160210
Lead Channel Impedance Value: 304 Ohm
Lead Channel Impedance Value: 399 Ohm
Lead Channel Impedance Value: 418 Ohm
Lead Channel Impedance Value: 494 Ohm
Lead Channel Pacing Threshold Amplitude: 0.625 V
Lead Channel Pacing Threshold Amplitude: 0.625 V
Lead Channel Pacing Threshold Pulse Width: 0.4 ms
Lead Channel Pacing Threshold Pulse Width: 0.4 ms
Lead Channel Sensing Intrinsic Amplitude: 4.125 mV
Lead Channel Sensing Intrinsic Amplitude: 4.125 mV
Lead Channel Sensing Intrinsic Amplitude: 4.25 mV
Lead Channel Sensing Intrinsic Amplitude: 4.25 mV
Lead Channel Setting Pacing Amplitude: 2 V
Lead Channel Setting Pacing Amplitude: 2.5 V
Lead Channel Setting Pacing Pulse Width: 0.4 ms
Lead Channel Setting Sensing Sensitivity: 2 mV

## 2017-11-23 ENCOUNTER — Other Ambulatory Visit: Payer: Self-pay | Admitting: Family Medicine

## 2017-11-23 DIAGNOSIS — G90A Postural orthostatic tachycardia syndrome (POTS): Secondary | ICD-10-CM

## 2017-11-23 DIAGNOSIS — R000 Tachycardia, unspecified: Principal | ICD-10-CM

## 2017-11-23 DIAGNOSIS — I951 Orthostatic hypotension: Principal | ICD-10-CM

## 2017-11-24 NOTE — Telephone Encounter (Signed)
Amitiza refill Last Refill:10/31/17 # 30 capsules No RF Last OV: 08/27/17 PCP: Dr Jeananne Rama Pharmacy:[facility] [address]

## 2018-01-14 ENCOUNTER — Other Ambulatory Visit: Payer: Self-pay | Admitting: Family Medicine

## 2018-01-14 DIAGNOSIS — R000 Tachycardia, unspecified: Principal | ICD-10-CM

## 2018-01-14 DIAGNOSIS — G90A Postural orthostatic tachycardia syndrome (POTS): Secondary | ICD-10-CM

## 2018-01-14 DIAGNOSIS — I951 Orthostatic hypotension: Principal | ICD-10-CM

## 2018-01-14 NOTE — Telephone Encounter (Signed)
amitiza  refill Last Refill:11/24/17 # 30 no RF Last OV: 08/27/17 PCP: Dr Jeananne Rama Pharmacy:[facility] [address]

## 2018-01-23 ENCOUNTER — Ambulatory Visit: Payer: Medicare Other

## 2018-01-23 VITALS — BP 112/62 | HR 80 | Temp 98.2°F | Resp 16 | Ht 65.0 in | Wt 165.0 lb

## 2018-01-23 DIAGNOSIS — Z0000 Encounter for general adult medical examination without abnormal findings: Secondary | ICD-10-CM

## 2018-01-23 NOTE — Patient Instructions (Addendum)
[name] , Thank you for taking time to come for yourMedicare Wellness Visit. I appreciate your ongoing commitment to your health goals. Please review the following plan we discussed and let me know if I can assist you in the future.   Screening recommendations/referrals: Colonoscopy: completed 06/29/2008, call [facility] when ready to schedule  Mammogram: completed 06/25/2016 Bone Density: Due at age 47 Recommended yearly ophthalmology/optometry visit for glaucoma screening and checkup Recommended yearly dental visit for hygiene and checkup  Vaccinations: Influenza vaccine: due 02/2018 Pneumococcal vaccine: due at age 82 Tdap vaccine: up to date Shingles vaccine: Due at age 74  Advanced directives: Please bring a copy of your health care power of attorney and living will to the office at your convenience.  Conditions/risks identified: recommend drinking at least 6-8 glasses of water a day   Next appointment:  Follow up on 03/18/2018 at 10:00am with Dr.Crissman. Follow up in one year for your annual wellness exam.   Preventive Care 40-64 Years, Female Preventive care refers to lifestyle choices and visits with your health care provider that can promote health and wellness. What does preventive care include?  A yearly physical exam. This is also called an annual well check.  Dental exams once or twice a year.  Routine eye exams. Ask your health care provider how often you should have your eyes checked.  Personal lifestyle choices, including:  Daily care of your teeth and gums.  Regular physical activity.  Eating a healthy diet.  Avoiding tobacco and drug use.  Limiting alcohol use.  Practicing safe sex.  Taking low-dose aspirin daily starting at age 20.  Taking vitamin and mineral supplements as recommended by your health care provider. What happens during an annual well check? The services and screenings done by your health care provider during your annual  well check will depend on your age, overall health, lifestyle risk factors, and family history of disease. Counseling  Your health care provider may ask you questions about your:  Alcohol use.  Tobacco use.  Drug use.  Emotional well-being.  Home and relationship well-being.  Sexual activity.  Eating habits.  Work and work Statistician.  Method of birth control.  Menstrual cycle.  Pregnancy history. Screening  You may have the following tests or measurements:  Height, weight, and BMI.  Blood pressure.  Lipid and cholesterol levels. These may be checked every 5 years, or more frequently if you are over 42 years old.  Skin check.  Lung cancer screening. You may have this screening every year starting at age 70 if you have a 30-pack-year history of smoking and currently smoke or have quit within the past 15 years.  Fecal occult blood test (FOBT) of the stool. You may have this test every year starting at age 46.  Flexible sigmoidoscopy or colonoscopy. You may have a sigmoidoscopy every 5 years or a colonoscopy every 10 years starting at age 74.  Hepatitis C blood test.  Hepatitis B blood test.  Sexually transmitted disease (STD) testing.  Diabetes screening. This is done by checking your blood sugar (glucose) after you have not eaten for a while (fasting). You may have this done every 1-3 years.  Mammogram. This may be done every 1-2 years. Talk to your health care provider about when you should start having regular mammograms. This may depend on whether you have a family history of breast cancer.  BRCA-related cancer screening. This may be done if you have a family history of breast, ovarian, tubal,  or peritoneal cancers.  Pelvic exam and Pap test. This may be done every 3 years starting at age 21. Starting at age 30, this may be done every 5 years if you have a Pap test in combination with an HPV test.  Bone density scan. This is done to screen for osteoporosis.  You may have this scan if you are at high risk for osteoporosis. Discuss your test results, treatment options, and if necessary, the need for more tests with your health care provider. Vaccines  Your health care provider may recommend certain vaccines, such as:  Influenza vaccine. This is recommended every year.  Tetanus, diphtheria, and acellular pertussis (Tdap, Td) vaccine. You may need a Td booster every 10 years.  Zoster vaccine. You may need this after age 60.  Pneumococcal 13-valent conjugate (PCV13) vaccine. You may need this if you have certain conditions and were not previously vaccinated.  Pneumococcal polysaccharide (PPSV23) vaccine. You may need one or two doses if you smoke cigarettes or if you have certain conditions. Talk to your health care provider about which screenings and vaccines you need and how often you need them. This information is not intended to replace advice given to you by your health care provider. Make sure you discuss any questions you have with your health care provider. Document Released: 06/16/2015 Document Revised: 02/07/2016 Document Reviewed: 03/21/2015 Elsevier Interactive Patient Education  2017 [employer]    Fall Prevention in the Home Falls can cause injuries. They can happen to people of all ages. There are many things you can do to make your home safe and to help prevent falls. What can I do on the outside of my home?  Regularly fix the edges of walkways and driveways and fix any cracks.  Remove anything that might make you trip as you walk through a door, such as a raised step or threshold.  Trim any bushes or trees on the path to your home.  Use bright outdoor lighting.  Clear any walking paths of anything that might make someone trip, such as rocks or tools.  Regularly check to see if handrails are loose or broken. Make sure that both sides of any steps have handrails.  Any raised decks and porches should have guardrails on  the edges.  Have any leaves, snow, or ice cleared regularly.  Use sand or salt on walking paths during winter.  Clean up any spills in your garage right away. This includes oil or grease spills. What can I do in the bathroom?  Use night lights.  Install grab bars by the toilet and in the tub and shower. Do not use towel bars as grab bars.  Use non-skid mats or decals in the tub or shower.  If you need to sit down in the shower, use a plastic, non-slip stool.  Keep the floor dry. Clean up any water that spills on the floor as soon as it happens.  Remove soap buildup in the tub or shower regularly.  Attach bath mats securely with double-sided non-slip rug tape.  Do not have throw rugs and other things on the floor that can make you trip. What can I do in the bedroom?  Use night lights.  Make sure that you have a light by your bed that is easy to reach.  Do not use any sheets or blankets that are too big for your bed. They should not hang down onto the floor.  Have a firm chair that has side arms. You   can use this for support while you get dressed.  Do not have throw rugs and other things on the floor that can make you trip. What can I do in the kitchen?  Clean up any spills right away.  Avoid walking on wet floors.  Keep items that you use a lot in easy-to-reach places.  If you need to reach something above you, use a strong step stool that has a grab bar.  Keep electrical cords out of the way.  Do not use floor polish or wax that makes floors slippery. If you must use wax, use non-skid floor wax.  Do not have throw rugs and other things on the floor that can make you trip. What can I do with my stairs?  Do not leave any items on the stairs.  Make sure that there are handrails on both sides of the stairs and use them. Fix handrails that are broken or loose. Make sure that handrails are as long as the stairways.  Check any carpeting to make sure that it is firmly  attached to the stairs. Fix any carpet that is loose or worn.  Avoid having throw rugs at the top or bottom of the stairs. If you do have throw rugs, attach them to the floor with carpet tape.  Make sure that you have a light switch at the top of the stairs and the bottom of the stairs. If you do not have them, ask someone to add them for you. What else can I do to help prevent falls?  Wear shoes that:  Do not have high heels.  Have rubber bottoms.  Are comfortable and fit you well.  Are closed at the toe. Do not wear sandals.  If you use a stepladder:  Make sure that it is fully opened. Do not climb a closed stepladder.  Make sure that both sides of the stepladder are locked into place.  Ask someone to hold it for you, if possible.  Clearly mark and make sure that you can see:  Any grab bars or handrails.  First and last steps.  Where the edge of each step is.  Use tools that help you move around (mobility aids) if they are needed. These include:  Canes.  Walkers.  Scooters.  Crutches.  Turn on the lights when you go into a dark area. Replace any light bulbs as soon as they burn out.  Set up your furniture so you have a clear path. Avoid moving your furniture around.  If any of your floors are uneven, fix them.  If there are any pets around you, be aware of where they are.  Review your medicines with your doctor. Some medicines can make you feel dizzy. This can increase your chance of falling. Ask your doctor what other things that you can do to help prevent falls. This information is not intended to replace advice given to you by your health care provider. Make sure you discuss any questions you have with your health care provider. Document Released: 03/16/2009 Document Revised: 10/26/2015 Document Reviewed: 06/24/2014 Elsevier Interactive Patient Education  2017 [employer].

## 2018-01-23 NOTE — Progress Notes (Signed)
Subjective:   Angela [name] is a 47 y.o. female who presents for Medicare Annual (Subsequent) preventive examination.  Review of Systems:  Cardiac Risk Factors include: advanced age (>38men, >16 women);hypertension     Objective:     Vitals: BP 112/62 (BP Location: Left Arm, Patient Position: Sitting)   Pulse 80   Temp 98.2 F (36.8 C) (Temporal)   Resp 16   Ht 5\' 5"  (1.651 m)   Wt 165 lb (74.8 kg)   BMI 27.46 kg/m   Body mass index is 27.46 kg/m.  Advanced Directives 01/23/2018 01/17/2017 12/11/2015 07/13/2014  Does Patient Have a Medical Advance Directive? Yes No No No  Type of Paramedic of [place];Living will - - -  Copy of [place] in Chart? No - copy requested - - -  Would patient like information on creating a medical advance directive? - No - Patient declined No - patient declined information No - patient declined information    Tobacco Social History   Tobacco Use  Smoking Status Never Smoker  Smokeless Tobacco Never Used     Counseling given: Not Answered   Clinical Intake:  Pre-visit preparation completed: Yes  Pain : No/denies pain     Nutritional Status: BMI 25 -29 Overweight Nutritional Risks: None Diabetes: No  How often do you need to have someone help you when you read instructions, pamphlets, or other written materials from your doctor or pharmacy?: 1 - Never What is the last grade level you completed in school?: bachelors  Interpreter Needed?: No  Information entered by :: Tiffany Hill,LPN   Past Medical History:  Diagnosis Date  . Chest pain    a cath 2/10. EF 50-55% normal coronaries  . Complete heart block ([place])   . Depression   . Dysautonomia ([place])   . Dyspnea    CT negative for PE, 2007  . Hypertension   . Insomnia   . OCD (obsessive compulsive disorder)   . SVT (supraventricular tachycardia) (HCC)    s/p ablation a. c/b AV nod ablation requiring pacemaker   Past Surgical  History:  Procedure Laterality Date  . BREAST BIOPSY Right 1998  . BREAST BIOPSY Right 2004  . BREAST LUMPECTOMY     RIGHT -BREAST   . ENDOMETRIAL ABLATION  2005  . PELVIC LAPAROSCOPY  1990  . PERMANENT PACEMAKER GENERATOR CHANGE N/A 07/13/2014   MDT MRI compatible dual chamber pacemaker implanted by Dr Caryl Comes  . SPARK  2005  . TUBAL LIGATION  2003   Family History  Problem Relation Age of Onset  . Hypertension Father   . Cancer Father 29       COLON  . Diabetes Father   . Breast cancer Paternal Aunt   . Hypertension Maternal Grandfather   . Diabetes Paternal Grandmother   . Breast cancer Paternal Grandmother   . Diabetes Paternal Grandfather   . Breast cancer Maternal Aunt        mastectomy  . Dementia Maternal Grandmother   . Breast cancer Paternal Aunt    Social History   Socioeconomic History  . Marital status: Married    Spouse name: Not on file  . Number of children: Not on file  . Years of education: Not on file  . Highest education level: Bachelor's degree (e.g., BA, AB, BS)  Occupational History  . Not on file  Social Needs  . Financial resource strain: Not hard at all  . Food insecurity:  Worry: Never true    Inability: Never true  . Transportation needs:    Medical: No    Non-medical: No  Tobacco Use  . Smoking status: Never Smoker  . Smokeless tobacco: Never Used  Substance and Sexual Activity  . Alcohol use: No  . Drug use: No  . Sexual activity: Yes    Birth control/protection: Surgical  Lifestyle  . Physical activity:    Days per week: 5 days    Minutes per session: 40 min  . Stress: Not at all  Relationships  . Social connections:    Talks on phone: More than three times a week    Gets together: More than three times a week    Attends religious service: More than 4 times per year    Active member of club or organization: No    Attends meetings of clubs or organizations: Never    Relationship status: Married  Other Topics Concern  .  Not on file  Social History Narrative   Work with daughters school     Outpatient Encounter Medications as of 01/23/2018  Medication Sig  . AMITIZA 24 MCG capsule TAKE 1 CAPSULE(24 MCG) BY MOUTH TWICE DAILY WITH A MEAL  . linaclotide (LINZESS) 72 MCG capsule Take 1 capsule (72 mcg total) by mouth daily.  Marland Kitchen LORazepam (ATIVAN) 0.5 MG tablet Take 1 tablet (0.5 mg total) by mouth daily as needed for anxiety.  . meloxicam (MOBIC) 15 MG tablet Take 1 tablet (15 mg total) by mouth daily.  . metoprolol tartrate (LOPRESSOR) 50 MG tablet Take 1 tablet (50 mg total) by mouth 2 (two) times daily. (Patient taking differently: Take 50 mg by mouth 2 (two) times daily. Takes one at bedtime)  . Multiple Vitamins-Minerals (MULTIVITAMINS THER. W/MINERALS) TABS Take 1 tablet by mouth daily.  Marland Kitchen venlafaxine XR (EFFEXOR-XR) 150 MG 24 hr capsule Take 1 capsule (150 mg total) by mouth daily with breakfast.  . AMITIZA 24 MCG capsule TAKE 1 CAPSULE(24 MCG) BY MOUTH DAILY WITH BREAKFAST   No facility-administered encounter medications on file as of 01/23/2018.     Activities of Daily Living In your present state of health, do you have any difficulty performing the following activities: 01/23/2018  Hearing? N  Vision? N  Difficulty concentrating or making decisions? N  Walking or climbing stairs? N  Dressing or bathing? N  Doing errands, shopping? N  Preparing Food and eating ? N  Using the Toilet? N  In the past six months, have you accidently leaked urine? N  Do you have problems with loss of bowel control? N  Managing your Medications? N  Managing your Finances? N  Housekeeping or managing your Housekeeping? N  Some recent data might be hidden    Patient Care Team: Guadalupe Maple, MD as PCP - General (Unknown Physician Specialty)    Assessment:   This is a routine wellness examination for Angela [name].  Exercise Activities and Dietary recommendations Current Exercise Habits: Structured exercise class,  Type of exercise: treadmill;strength training/weights(jogging for 1 min at a time ), Time (Minutes): 60, Frequency (Times/Week): 5, Weekly Exercise (Minutes/Week): 300, Intensity: Moderate, Exercise limited by: None identified  Goals   None     Fall Risk Fall Risk  01/23/2018 01/17/2017 07/30/2016  Falls in the past year? Yes Yes Yes  Number falls in past yr: 1 1 1   Injury with Fall? No No No  Risk for fall due to : - - History of fall(s)   Is  the patient's home free of loose throw rugs in walkways, pet beds, electrical cords, etc?   yes      Grab bars in the bathroom? yes      Handrails on the stairs?   no stairs       Adequate lighting?   yes  Timed Get Up and Go performed: Completed in 8 seconds with no use of assistive devices, steady gait. No intervention needed at this time.   Depression Screen PHQ 2/9 Scores 01/23/2018 02/27/2017 01/17/2017 07/27/2015  PHQ - 2 Score 1 0 1 0  PHQ- 9 Score - 7 - -     Cognitive Function     6CIT Screen 01/23/2018 01/17/2017  What Year? 0 points 0 points  What month? 0 points 0 points  What time? 0 points 0 points  Count back from 20 0 points 0 points  Months in reverse 0 points 0 points  Repeat phrase 0 points 2 points  Total Score 0 2    Immunization History  Administered Date(s) Administered  . Influenza,inj,Quad PF,6+ Mos 02/27/2017  . Tdap 08/02/2011    Qualifies for Shingles Vaccine? No  Screening Tests Health Maintenance  Topic Date Due  . INFLUENZA VACCINE  01/01/2018  . MAMMOGRAM  07/11/2018  . PAP SMEAR  02/28/2020  . TETANUS/TDAP  08/01/2021  . HIV Screening  Completed    Cancer Screenings: Lung: Low Dose CT Chest recommended if Age 27-80 years, 30 pack-year currently smoking OR have quit w/in 15years. Patient does not qualify. Breast:  Up to date on Mammogram? Yes  07/11/2017 Up to date of Bone Density/Dexa? Not indicated  Colorectal: not indicated   Additional Screenings:  Hepatitis C Screening: not indicated       Plan:    I have personally reviewed and addressed the Medicare Annual Wellness questionnaire and have noted the following in the patient's chart:  A. Medical and social history B. Use of alcohol, tobacco or illicit drugs  C. Current medications and supplements D. Functional ability and status E.  Nutritional status F.  Physical activity G. Advance directives H. List of other physicians I.  Hospitalizations, surgeries, and ER visits in previous 12 months J.  [place] such as hearing and vision if needed, cognitive and depression L. Referrals and appointments   In addition, I have reviewed and discussed with patient certain preventive protocols, quality metrics, and best practice recommendations. A written personalized care plan for preventive services as well as general preventive health recommendations were provided to patient.   Signed,  Tyler Aas, LPN Nurse Health Advisor   Nurse Notes: requests refill on ativan - has appt on 03/18/2018 with Dr.Crissman. She is okay waiting for this appt to have it refilled. She will call interm if she feels she needs it prior- she understands she will need an appt if she needs it refilled.

## 2018-02-01 ENCOUNTER — Other Ambulatory Visit: Payer: Self-pay | Admitting: Family Medicine

## 2018-02-01 DIAGNOSIS — R000 Tachycardia, unspecified: Principal | ICD-10-CM

## 2018-02-01 DIAGNOSIS — G90A Postural orthostatic tachycardia syndrome (POTS): Secondary | ICD-10-CM

## 2018-02-01 DIAGNOSIS — I951 Orthostatic hypotension: Principal | ICD-10-CM

## 2018-02-03 NOTE — Telephone Encounter (Signed)
amitiza refill Last Refill:01/14/18 # 30 Last OV: 08/27/17 PCP: Dr Golden Pop Pharmacy:[facility] [address] [place], [place]

## 2018-02-10 ENCOUNTER — Ambulatory Visit (INDEPENDENT_AMBULATORY_CARE_PROVIDER_SITE_OTHER): Payer: Medicare Other | Admitting: *Deleted

## 2018-02-10 DIAGNOSIS — I442 Atrioventricular block, complete: Secondary | ICD-10-CM

## 2018-02-10 NOTE — Progress Notes (Signed)
Remote pacemaker transmission.   

## 2018-02-13 ENCOUNTER — Other Ambulatory Visit: Payer: Self-pay | Admitting: Family Medicine

## 2018-02-13 DIAGNOSIS — F3342 Major depressive disorder, recurrent, in full remission: Secondary | ICD-10-CM

## 2018-02-13 NOTE — Telephone Encounter (Signed)
Effexor refill Last Refill:02/27/17 # 90 Last OV: ( Cant find document related to visit and medication) PCP:  Crissman [id number]  Mobic refill Last Refill:15mg   # 90 Last OV: ( same as above)  PCP: Crissman [facility] [phone]

## 2018-03-03 ENCOUNTER — Other Ambulatory Visit: Payer: Self-pay | Admitting: Family Medicine

## 2018-03-03 DIAGNOSIS — F3289 Other specified depressive episodes: Secondary | ICD-10-CM

## 2018-03-03 MED ORDER — LORAZEPAM 0.5 MG PO TABS: 0.5000 mg | ORAL_TABLET | Freq: Every day | ORAL | 0 refills | Status: DC | PRN

## 2018-03-04 LAB — CUP PACEART REMOTE DEVICE CHECK
Battery Remaining Longevity: 70 mo
Brady Statistic AP VS Percent: 0.03 %
Brady Statistic AS VP Percent: 82.79 %
Brady Statistic AS VS Percent: 0.04 %
Brady Statistic RV Percent Paced: 99.91 %
Implantable Lead Implant Date: 20070319
Implantable Lead Location: 753859
Implantable Pulse Generator Implant Date: 20160210
Lead Channel Impedance Value: 418 Ohm
Lead Channel Impedance Value: 475 Ohm
Lead Channel Pacing Threshold Amplitude: 0.75 V
Lead Channel Sensing Intrinsic Amplitude: 3.375 mV
Lead Channel Sensing Intrinsic Amplitude: 3.375 mV
Lead Channel Sensing Intrinsic Amplitude: 3.75 mV
Lead Channel Sensing Intrinsic Amplitude: 3.75 mV
Lead Channel Setting Pacing Amplitude: 2 V
Lead Channel Setting Pacing Amplitude: 2.5 V
Lead Channel Setting Pacing Pulse Width: 0.4 ms
MDC IDC LEAD IMPLANT DT: 20070319
MDC IDC LEAD LOCATION: 753860
MDC IDC MSMT BATTERY VOLTAGE: 3 V
MDC IDC MSMT LEADCHNL RA IMPEDANCE VALUE: 304 Ohm
MDC IDC MSMT LEADCHNL RA PACING THRESHOLD AMPLITUDE: 0.75 V
MDC IDC MSMT LEADCHNL RA PACING THRESHOLD PULSEWIDTH: 0.4 ms
MDC IDC MSMT LEADCHNL RV IMPEDANCE VALUE: 418 Ohm
MDC IDC MSMT LEADCHNL RV PACING THRESHOLD PULSEWIDTH: 0.4 ms
MDC IDC SESS DTM: 20190910122525
MDC IDC SET LEADCHNL RV SENSING SENSITIVITY: 2 mV
MDC IDC STAT BRADY AP VP PERCENT: 17.13 %
MDC IDC STAT BRADY RA PERCENT PACED: 17.15 %

## 2018-03-18 ENCOUNTER — Other Ambulatory Visit: Payer: Self-pay

## 2018-03-18 ENCOUNTER — Encounter: Payer: Self-pay | Admitting: Family Medicine

## 2018-03-18 ENCOUNTER — Ambulatory Visit (INDEPENDENT_AMBULATORY_CARE_PROVIDER_SITE_OTHER): Payer: Medicare Other | Admitting: Family Medicine

## 2018-03-18 VITALS — BP 127/85 | HR 79 | Temp 98.2°F | Ht 64.0 in | Wt 162.6 lb

## 2018-03-18 DIAGNOSIS — Z23 Encounter for immunization: Secondary | ICD-10-CM | POA: Diagnosis not present

## 2018-03-18 DIAGNOSIS — I951 Orthostatic hypotension: Secondary | ICD-10-CM

## 2018-03-18 DIAGNOSIS — G90A Postural orthostatic tachycardia syndrome (POTS): Secondary | ICD-10-CM

## 2018-03-18 DIAGNOSIS — R000 Tachycardia, unspecified: Secondary | ICD-10-CM | POA: Diagnosis not present

## 2018-03-18 DIAGNOSIS — F3342 Major depressive disorder, recurrent, in full remission: Secondary | ICD-10-CM

## 2018-03-18 DIAGNOSIS — Z0000 Encounter for general adult medical examination without abnormal findings: Secondary | ICD-10-CM

## 2018-03-18 LAB — URINALYSIS, ROUTINE W REFLEX MICROSCOPIC
Bilirubin, UA: NEGATIVE
Glucose, UA: NEGATIVE
Ketones, UA: NEGATIVE
Leukocytes, UA: NEGATIVE
Nitrite, UA: NEGATIVE
PH UA: 7.5 (ref 5.0–7.5)
PROTEIN UA: NEGATIVE
RBC, UA: NEGATIVE
Specific Gravity, UA: 1.015 (ref 1.005–1.030)
Urobilinogen, Ur: 0.2 mg/dL (ref 0.2–1.0)

## 2018-03-18 MED ORDER — LINACLOTIDE 72 MCG PO CAPS: 72.0000 ug | ORAL_CAPSULE | Freq: Every day | ORAL | 2 refills | Status: DC

## 2018-03-18 MED ORDER — MELOXICAM 15 MG PO TABS: 15.0000 mg | ORAL_TABLET | Freq: Every day | ORAL | 4 refills | Status: DC

## 2018-03-18 MED ORDER — LUBIPROSTONE 24 MCG PO CAPS: 24.0000 ug | ORAL_CAPSULE | Freq: Every day | ORAL | 2 refills | Status: DC

## 2018-03-18 MED ORDER — VENLAFAXINE HCL ER 150 MG PO CP24: 150.0000 mg | ORAL_CAPSULE | Freq: Every day | ORAL | 4 refills | Status: DC

## 2018-03-18 MED ORDER — METOPROLOL TARTRATE 50 MG PO TABS: 50.0000 mg | ORAL_TABLET | Freq: Every day | ORAL | 4 refills | Status: DC

## 2018-03-18 NOTE — Patient Instructions (Signed)

## 2018-03-18 NOTE — Progress Notes (Signed)
BP 127/85   Pulse 79   Temp 98.2 F (36.8 C) (Oral)   Ht 5\' 4"  (1.626 m)   Wt 162 lb 9.6 oz (73.8 kg)   SpO2 99%   BMI 27.91 kg/m    Subjective:    Patient ID: Angela [name], female    DOB: [birth year], 47 y.o.   MRN: [id number]  HPI: Angela [name] is a 47 y.o. female  Chief Complaint  Patient presents with  . Annual Exam    pt had wellness exam 01/23/18  Patient all in all doing well on review patient's nerves uses rare lorazepam for very bad days and prescription is lasted over a year. For intermittent constipation will Amitiza and Lizness. Uses metoprolol without problems and OCD depression are stable with venlafaxine 150. Relevant past medical, surgical, family and social history reviewed and updated as indicated. Interim medical history since our last visit reviewed. Allergies and medications reviewed and updated.  Review of Systems  Constitutional: Negative.   Respiratory: Negative.   Cardiovascular: Negative.     Per HPI unless specifically indicated above     Objective:    BP 127/85   Pulse 79   Temp 98.2 F (36.8 C) (Oral)   Ht 5\' 4"  (1.626 m)   Wt 162 lb 9.6 oz (73.8 kg)   SpO2 99%   BMI 27.91 kg/m   Wt Readings from Last 3 Encounters:  03/18/18 162 lb 9.6 oz (73.8 kg)  01/23/18 165 lb (74.8 kg)  08/27/17 171 lb (77.6 kg)    Physical Exam  Constitutional: She is oriented to person, place, and time. She appears well-developed and well-nourished.  HENT:  Head: Normocephalic and atraumatic.  Right Ear: External ear normal.  Left Ear: External ear normal.  Nose: Nose normal.  Mouth/Throat: Oropharynx is clear and moist.  Eyes: Pupils are equal, round, and reactive to light. Conjunctivae and EOM are normal.  Neck: Normal range of motion. Neck supple. Carotid bruit is not present.  Cardiovascular: Normal rate, regular rhythm and normal heart sounds.  No murmur heard. Pulmonary/Chest: Effort normal and breath sounds normal. She exhibits no  mass. Right breast exhibits no mass, no skin change and no tenderness. Left breast exhibits no mass, no skin change and no tenderness. Breasts are symmetrical.  Abdominal: Soft. Bowel sounds are normal. There is no hepatosplenomegaly.  Musculoskeletal: Normal range of motion.  Neurological: She is alert and oriented to person, place, and time.  Skin: No rash noted.  Psychiatric: She has a normal mood and affect. Her behavior is normal. Judgment and thought content normal.    Results for orders placed or performed in visit on 02/10/18  CUP PACEART REMOTE DEVICE CHECK  Result Value Ref Range   Date Time Interrogation Session [phone]    Pulse Generator Manufacturer MERM    Pulse Gen Model [id number] Advisa DR MRI    Pulse Gen Serial Number [id number] [facility]    Implantable Pulse Generator Type Implantable Pulse Generator    Implantable Pulse Generator Implant Date 72094709    Implantable Lead Manufacturer [facility]    Implantable Lead Model 5076 CapSureFix Novus    Implantable Lead Serial Number [id number]    Implantable Lead Implant Date [id number]    Implantable Lead Location Detail 1 APPENDAGE    Implantable Lead Location [id number]    Implantable Lead Manufacturer MERM    Implantable Lead Model 5076 CapSureFix Novus    Implantable Lead Serial Number [id number]  Implantable Lead Implant Date [id number]    Implantable Lead Location Detail 1 APEX    Implantable Lead Location [phone]    Lead Channel Setting Sensing Sensitivity 2 mV   Lead Channel Setting Pacing Amplitude 2 V   Lead Channel Setting Pacing Pulse Width 0.4 ms   Lead Channel Setting Pacing Amplitude 2.5 V   Lead Channel Impedance Value 418 ohm   Lead Channel Impedance Value 304 ohm   Lead Channel Sensing Intrinsic Amplitude 3.75 mV   Lead Channel Sensing Intrinsic Amplitude 3.75 mV   Lead Channel Pacing Threshold Amplitude 0.75 V   Lead Channel Pacing Threshold Pulse Width 0.4 ms   Lead Channel  Impedance Value 475 ohm   Lead Channel Impedance Value 418 ohm   Lead Channel Sensing Intrinsic Amplitude 3.375 mV   Lead Channel Sensing Intrinsic Amplitude 3.375 mV   Lead Channel Pacing Threshold Amplitude 0.75 V   Lead Channel Pacing Threshold Pulse Width 0.4 ms   Battery Status OK    Battery Remaining Longevity 70 mo   Battery Voltage 3.00 V   Brady Statistic RA Percent Paced 17.15 %   Brady Statistic RV Percent Paced 99.91 %   Brady Statistic AP VP Percent 17.13 %   Brady Statistic AS VP Percent 82.79 %   Brady Statistic AP VS Percent 0.03 %   Brady Statistic AS VS Percent 0.04 %      Assessment & Plan:   Problem List Items Addressed This Visit      Cardiovascular and Mediastinum   POTS (postural orthostatic tachycardia syndrome)   Relevant Medications   lubiprostone (AMITIZA) 24 MCG capsule   metoprolol tartrate (LOPRESSOR) 50 MG tablet   linaclotide (LINZESS) 72 MCG capsule     Other   Depression   Relevant Medications   venlafaxine XR (EFFEXOR-XR) 150 MG 24 hr capsule    Other Visit Diagnoses    Need for influenza vaccination    -  Primary   Relevant Orders   Flu Vaccine QUAD 36+ mos IM (Completed)   PE (physical exam), annual       Relevant Orders   Comprehensive metabolic panel   Lipid panel   CBC with Differential/Platelet   TSH   Urinalysis, Routine w reflex microscopic       Follow up plan: Return in about 6 months (around 09/17/2018) for BMP.

## 2018-03-19 ENCOUNTER — Encounter: Payer: Self-pay | Admitting: Family Medicine

## 2018-03-19 LAB — COMPREHENSIVE METABOLIC PANEL
ALK PHOS: 92 IU/L (ref 39–117)
ALT: 19 IU/L (ref 0–32)
AST: 23 IU/L (ref 0–40)
Albumin/Globulin Ratio: 1.9 (ref 1.2–2.2)
Albumin: 4.3 g/dL (ref 3.5–5.5)
BUN/Creatinine Ratio: 17 (ref 9–23)
BUN: 13 mg/dL (ref 6–24)
Bilirubin Total: 0.3 mg/dL (ref 0.0–1.2)
CO2: 22 mmol/L (ref 20–29)
CREATININE: 0.78 mg/dL (ref 0.57–1.00)
Calcium: 9.6 mg/dL (ref 8.7–10.2)
Chloride: 100 mmol/L (ref 96–106)
GFR calc Af Amer: 105 mL/min/{1.73_m2} (ref >59)
GFR, EST NON AFRICAN AMERICAN: 91 mL/min/{1.73_m2} (ref >59)
GLOBULIN, TOTAL: 2.3 g/dL (ref 1.5–4.5)
GLUCOSE: 76 mg/dL (ref 65–99)
Potassium: 4.7 mmol/L (ref 3.5–5.2)
SODIUM: 139 mmol/L (ref 134–144)
Total Protein: 6.6 g/dL (ref 6.0–8.5)

## 2018-03-19 LAB — CBC WITH DIFFERENTIAL/PLATELET
Basophils Absolute: 0.1 10*3/uL (ref 0.0–0.2)
Basos: 1 %
EOS (ABSOLUTE): 0 10*3/uL (ref 0.0–0.4)
Eos: 0 %
Hematocrit: 44.6 % (ref 34.0–46.6)
Hemoglobin: 14.9 g/dL (ref 11.1–15.9)
IMMATURE GRANS (ABS): 0 10*3/uL (ref 0.0–0.1)
Immature Granulocytes: 0 %
LYMPHS ABS: 2.2 10*3/uL (ref 0.7–3.1)
LYMPHS: 29 %
MCH: 31.4 pg (ref 26.6–33.0)
MCHC: 33.4 g/dL (ref 31.5–35.7)
MCV: 94 fL (ref 79–97)
MONOCYTES: 9 %
Monocytes Absolute: 0.7 10*3/uL (ref 0.1–0.9)
NEUTROS ABS: 4.5 10*3/uL (ref 1.4–7.0)
Neutrophils: 61 %
Platelets: 294 10*3/uL (ref 150–450)
RBC: 4.75 x10E6/uL (ref 3.77–5.28)
RDW: 11.8 % — ABNORMAL LOW (ref 12.3–15.4)
WBC: 7.4 10*3/uL (ref 3.4–10.8)

## 2018-03-19 LAB — LIPID PANEL
Chol/HDL Ratio: 3 ratio (ref 0.0–4.4)
Cholesterol, Total: 188 mg/dL (ref 100–199)
HDL: 62 mg/dL (ref >39)
LDL CALC: 104 mg/dL — AB (ref 0–99)
TRIGLYCERIDES: 110 mg/dL (ref 0–149)
VLDL Cholesterol Cal: 22 mg/dL (ref 5–40)

## 2018-03-19 LAB — TSH: TSH: 1.1 u[IU]/mL (ref 0.450–4.500)

## 2018-05-12 ENCOUNTER — Telehealth: Payer: Self-pay | Admitting: Cardiology

## 2018-05-12 ENCOUNTER — Ambulatory Visit (INDEPENDENT_AMBULATORY_CARE_PROVIDER_SITE_OTHER): Payer: Medicare Other

## 2018-05-12 DIAGNOSIS — I442 Atrioventricular block, complete: Secondary | ICD-10-CM

## 2018-05-12 DIAGNOSIS — I471 Supraventricular tachycardia: Secondary | ICD-10-CM

## 2018-05-12 NOTE — Telephone Encounter (Signed)
Spoke with pt and reminded pt of remote transmission that is due today. Pt verbalized understanding.   

## 2018-05-13 NOTE — Progress Notes (Signed)
Remote pacemaker transmission.   

## 2018-06-02 ENCOUNTER — Other Ambulatory Visit: Payer: Self-pay | Admitting: Family Medicine

## 2018-06-02 DIAGNOSIS — Z1231 Encounter for screening mammogram for malignant neoplasm of breast: Secondary | ICD-10-CM

## 2018-06-21 LAB — CUP PACEART REMOTE DEVICE CHECK
Battery Remaining Longevity: 69 mo
Battery Voltage: 3 V
Brady Statistic AP VS Percent: 0.03 %
Brady Statistic AS VS Percent: 0.08 %
Date Time Interrogation Session: 20191210235827
Implantable Lead Implant Date: 20070319
Implantable Lead Location: 753859
Implantable Lead Location: 753860
Implantable Lead Model: 5076
Implantable Pulse Generator Implant Date: 20160210
Lead Channel Impedance Value: 399 Ohm
Lead Channel Impedance Value: 418 Ohm
Lead Channel Impedance Value: 475 Ohm
Lead Channel Pacing Threshold Pulse Width: 0.4 ms
Lead Channel Sensing Intrinsic Amplitude: 3.5 mV
Lead Channel Sensing Intrinsic Amplitude: 3.5 mV
Lead Channel Setting Pacing Amplitude: 2 V
Lead Channel Setting Pacing Amplitude: 2.5 V
Lead Channel Setting Pacing Pulse Width: 0.4 ms
Lead Channel Setting Sensing Sensitivity: 2 mV
MDC IDC LEAD IMPLANT DT: 20070319
MDC IDC MSMT LEADCHNL RA IMPEDANCE VALUE: 304 Ohm
MDC IDC MSMT LEADCHNL RA PACING THRESHOLD AMPLITUDE: 0.75 V
MDC IDC MSMT LEADCHNL RA SENSING INTR AMPL: 3.25 mV
MDC IDC MSMT LEADCHNL RA SENSING INTR AMPL: 3.25 mV
MDC IDC MSMT LEADCHNL RV PACING THRESHOLD AMPLITUDE: 0.625 V
MDC IDC MSMT LEADCHNL RV PACING THRESHOLD PULSEWIDTH: 0.4 ms
MDC IDC STAT BRADY AP VP PERCENT: 23.29 %
MDC IDC STAT BRADY AS VP PERCENT: 76.61 %
MDC IDC STAT BRADY RA PERCENT PACED: 23.29 %
MDC IDC STAT BRADY RV PERCENT PACED: 99.88 %

## 2018-07-13 ENCOUNTER — Inpatient Hospital Stay: Admission: RE | Admit: 2018-07-13 | Payer: Medicare Other | Source: Ambulatory Visit

## 2018-07-20 ENCOUNTER — Encounter: Payer: Self-pay | Admitting: Family Medicine

## 2018-07-20 DIAGNOSIS — Z1239 Encounter for other screening for malignant neoplasm of breast: Secondary | ICD-10-CM

## 2018-07-20 DIAGNOSIS — M7130 Other bursal cyst, unspecified site: Secondary | ICD-10-CM

## 2018-07-20 NOTE — Telephone Encounter (Signed)
New order placed, please let her know  Copied from [facility] [id number]. Topic: General - Other >> Jul 20, 2018  2:51 PM Carolyn Stare wrote:  [facility] told pt they need a new order req for her yearly exam [place]

## 2018-07-21 NOTE — Telephone Encounter (Signed)
Pt.notified

## 2018-07-22 ENCOUNTER — Other Ambulatory Visit: Payer: Self-pay | Admitting: Family Medicine

## 2018-07-22 DIAGNOSIS — Z1231 Encounter for screening mammogram for malignant neoplasm of breast: Secondary | ICD-10-CM

## 2018-07-28 ENCOUNTER — Ambulatory Visit
Admission: RE | Admit: 2018-07-28 | Discharge: 2018-07-28 | Disposition: A | Discharge status: Home / Self Care | Payer: Medicare Other | Source: Ambulatory Visit | Attending: Family Medicine | Admitting: Family Medicine

## 2018-07-28 DIAGNOSIS — Z1231 Encounter for screening mammogram for malignant neoplasm of breast: Secondary | ICD-10-CM

## 2018-07-28 IMAGING — MG DIGITAL SCREENING BILATERAL MAMMOGRAM WITH TOMO AND CAD
8 series · 8 of 24 positions shown · non-contrast
Comparison: Previous exam(s).

CLINICAL DATA: Screening.

EXAM:
DIGITAL SCREENING BILATERAL MAMMOGRAM WITH TOMO AND CAD

[R CC synth-2D]
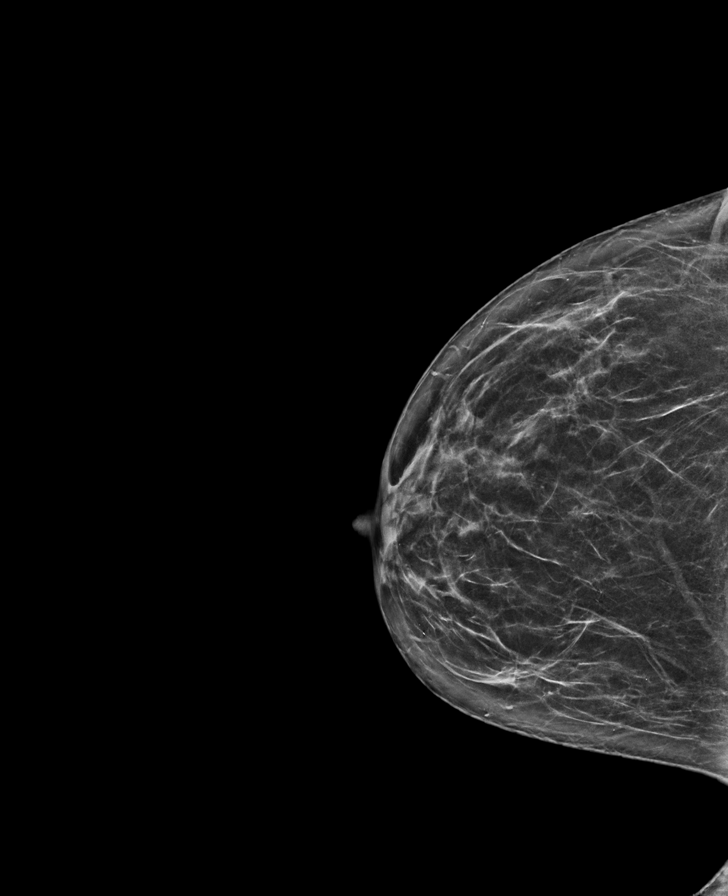

[L MLO synth-2D]
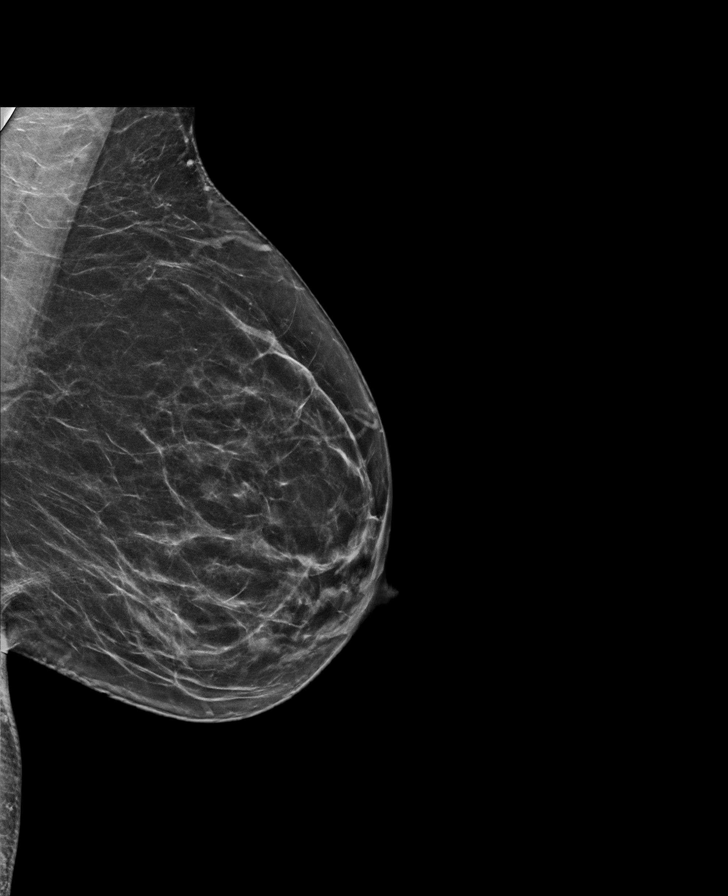

[R MLO synth-2D]
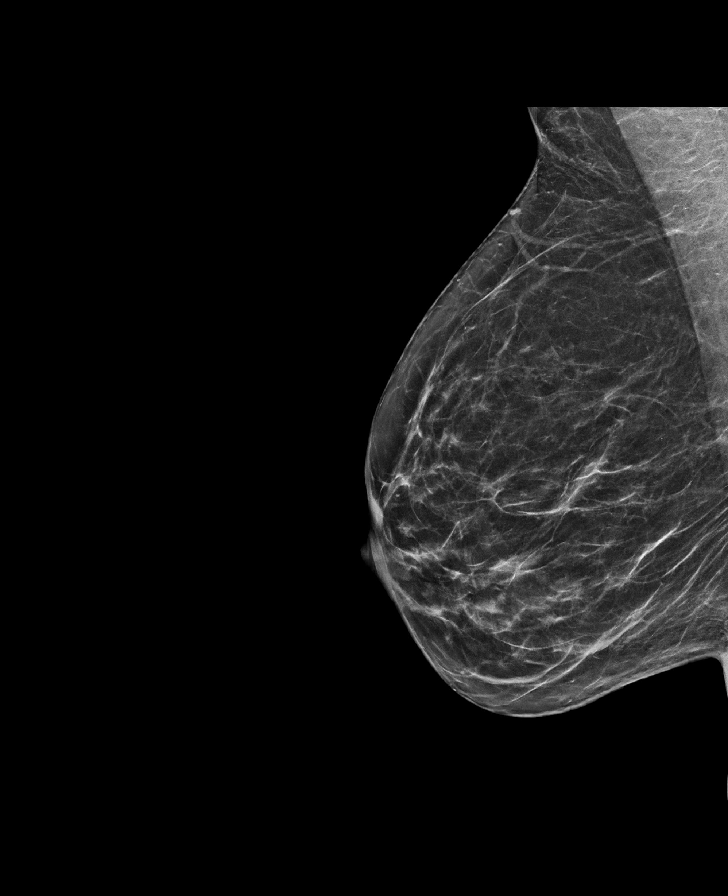

[L CC synth-2D]
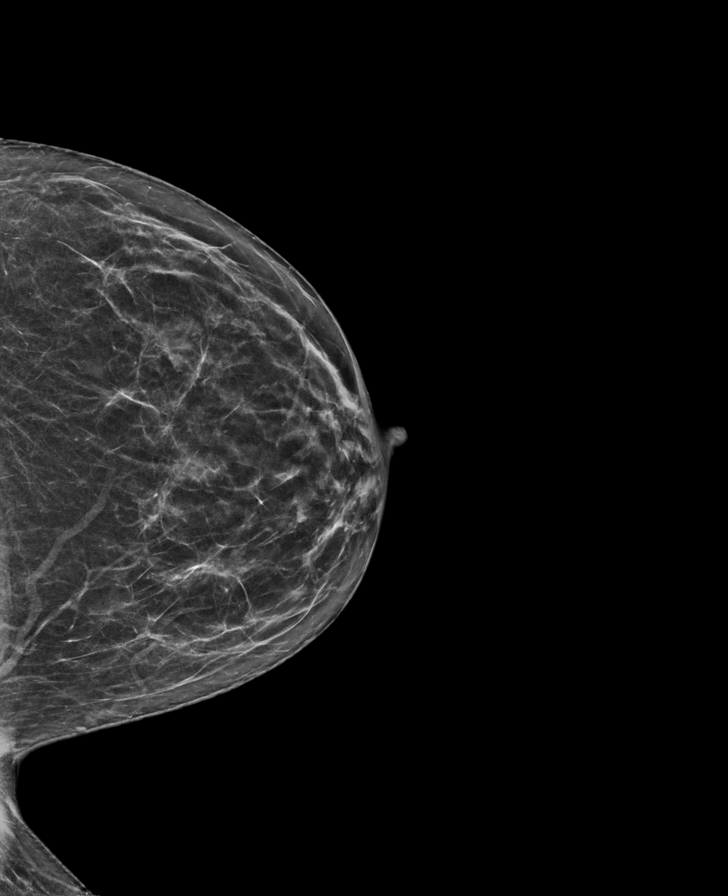

[R CC tomo · tomo slice 33/65.0]
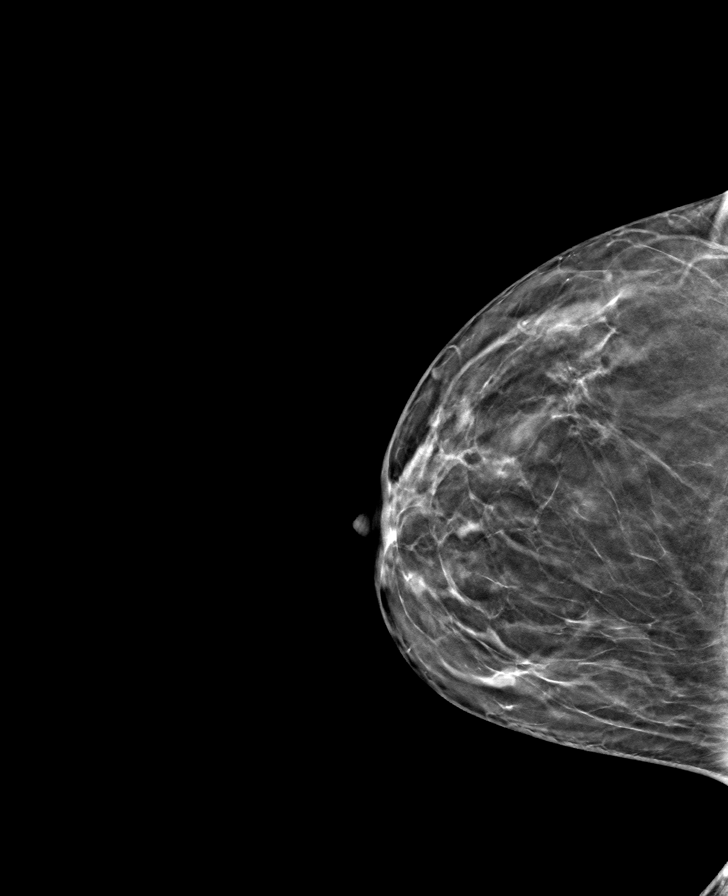

[L CC tomo · tomo slice 31/62.0]
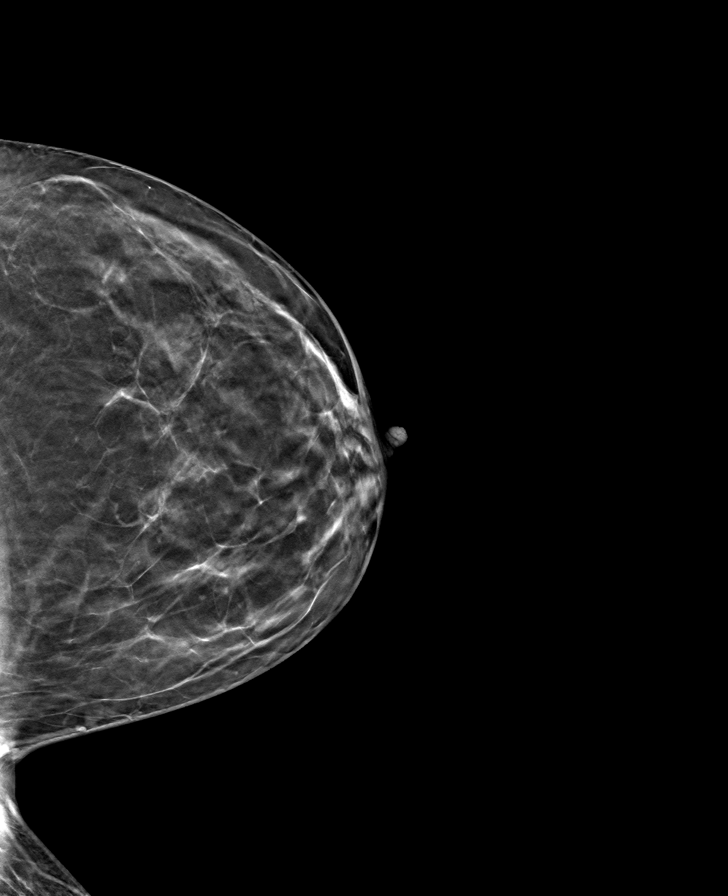

[R MLO tomo · tomo slice 33/65.0]
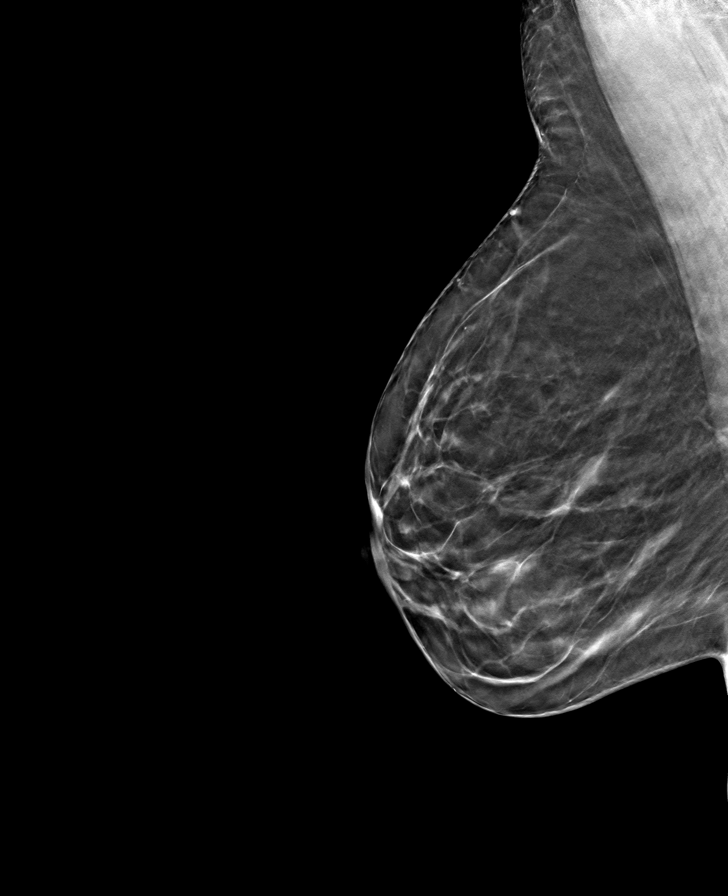

[L MLO tomo · tomo slice 33/65.0]
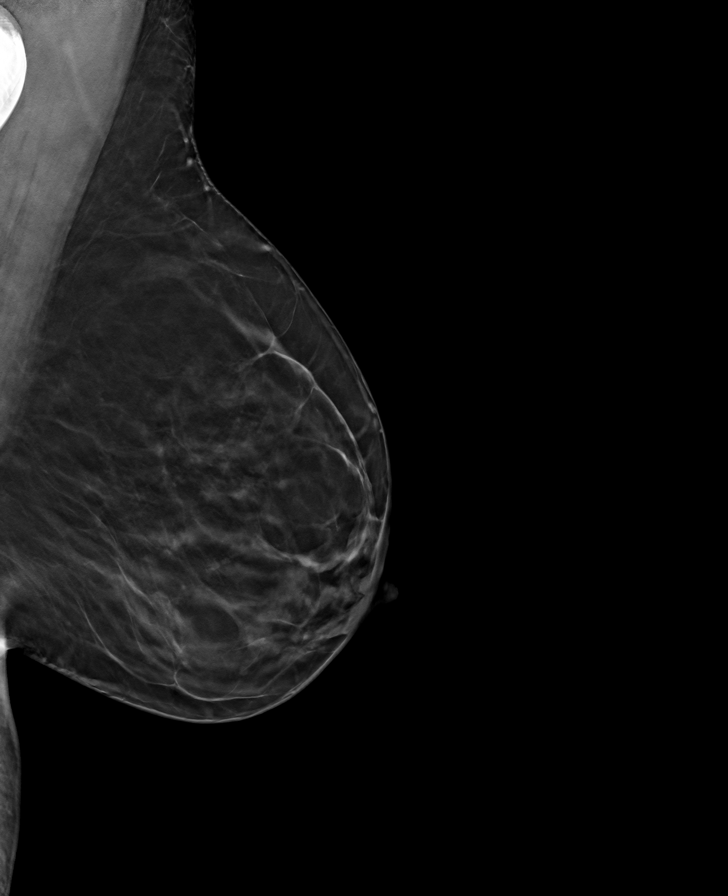

[8 of 24 positions shown; findings below may reference images not displayed]

ACR Breast Density Category b: There are scattered areas of
fibroglandular density.
FINDINGS: There are no findings suspicious for malignancy. Images were
processed with CAD.
IMPRESSION: No mammographic evidence of malignancy. A result letter of this
screening mammogram will be mailed directly to the patient.

RECOMMENDATION:
Screening mammogram in one year. (Code:[TQ])

BI-RADS CATEGORY  1: Negative.

## 2018-08-05 ENCOUNTER — Telehealth: Payer: Medicare Other | Admitting: Nurse Practitioner

## 2018-08-05 NOTE — Progress Notes (Signed)
Mother was informed that we do  Not treat anyone under the age of 70

## 2018-08-11 ENCOUNTER — Ambulatory Visit (INDEPENDENT_AMBULATORY_CARE_PROVIDER_SITE_OTHER): Payer: Medicare Other | Admitting: *Deleted

## 2018-08-11 DIAGNOSIS — I442 Atrioventricular block, complete: Secondary | ICD-10-CM

## 2018-08-13 DIAGNOSIS — M67442 Ganglion, left hand: Secondary | ICD-10-CM | POA: Insufficient documentation

## 2018-08-13 LAB — CUP PACEART REMOTE DEVICE CHECK
Battery Voltage: 3 V
Brady Statistic AP VP Percent: 28.6 %
Brady Statistic AP VS Percent: 0.02 %
Brady Statistic AS VP Percent: 71.33 %
Brady Statistic RA Percent Paced: 28.59 %
Brady Statistic RV Percent Paced: 99.91 %
Implantable Lead Implant Date: 20070319
Implantable Lead Implant Date: 20070319
Implantable Lead Location: 753859
Implantable Lead Location: 753860
Implantable Lead Model: 5076
Implantable Lead Model: 5076
Implantable Pulse Generator Implant Date: 20160210
Lead Channel Impedance Value: 285 Ohm
Lead Channel Impedance Value: 475 Ohm
Lead Channel Pacing Threshold Amplitude: 0.75 V
Lead Channel Pacing Threshold Pulse Width: 0.4 ms
Lead Channel Pacing Threshold Pulse Width: 0.4 ms
Lead Channel Sensing Intrinsic Amplitude: 3.75 mV
Lead Channel Sensing Intrinsic Amplitude: 3.75 mV
Lead Channel Sensing Intrinsic Amplitude: 4 mV
Lead Channel Sensing Intrinsic Amplitude: 4 mV
Lead Channel Setting Pacing Amplitude: 2 V
Lead Channel Setting Pacing Amplitude: 2.5 V
Lead Channel Setting Pacing Pulse Width: 0.4 ms
Lead Channel Setting Sensing Sensitivity: 2 mV
MDC IDC MSMT BATTERY REMAINING LONGEVITY: 60 mo
MDC IDC MSMT LEADCHNL RA IMPEDANCE VALUE: 399 Ohm
MDC IDC MSMT LEADCHNL RA PACING THRESHOLD AMPLITUDE: 0.75 V
MDC IDC MSMT LEADCHNL RV IMPEDANCE VALUE: 418 Ohm
MDC IDC SESS DTM: 20200310130934
MDC IDC STAT BRADY AS VS PERCENT: 0.05 %

## 2018-08-17 ENCOUNTER — Encounter: Payer: Self-pay | Admitting: Family Medicine

## 2018-08-17 ENCOUNTER — Other Ambulatory Visit: Payer: Self-pay | Admitting: Family Medicine

## 2018-08-17 DIAGNOSIS — F3289 Other specified depressive episodes: Secondary | ICD-10-CM

## 2018-08-18 ENCOUNTER — Encounter: Payer: Self-pay | Admitting: Cardiology

## 2018-08-18 NOTE — Progress Notes (Signed)
Remote pacemaker transmission.   

## 2018-08-20 ENCOUNTER — Telehealth: Payer: Self-pay

## 2018-08-20 NOTE — Telephone Encounter (Signed)
Spoke with pt to assess her needs. She states at this time she is feeling great. She denies any dizziness, syncope, CP or SOB. She states she does not need any refills at this time. Pt is compliant with her remote checks; last remote showed episodes of AT. Dr Caryl Comes is to advise.   She agrees to have her appt postponed until a later date due to COVID-19 outbreak. She will call the office for any additional needs in the meantime.

## 2018-08-26 ENCOUNTER — Encounter: Payer: Medicare Other | Admitting: Internal Medicine

## 2018-09-02 ENCOUNTER — Telehealth (INDEPENDENT_AMBULATORY_CARE_PROVIDER_SITE_OTHER): Payer: Medicare Other | Admitting: Internal Medicine

## 2018-09-02 ENCOUNTER — Other Ambulatory Visit: Payer: Self-pay

## 2018-09-02 VITALS — BP 110/73 | HR 70 | Ht 64.0 in | Wt 163.0 lb

## 2018-09-02 DIAGNOSIS — I442 Atrioventricular block, complete: Secondary | ICD-10-CM

## 2018-09-02 DIAGNOSIS — G901 Familial dysautonomia [Riley-Day]: Secondary | ICD-10-CM

## 2018-09-02 DIAGNOSIS — I471 Supraventricular tachycardia: Secondary | ICD-10-CM | POA: Diagnosis not present

## 2018-09-02 DIAGNOSIS — Z950 Presence of cardiac pacemaker: Secondary | ICD-10-CM | POA: Diagnosis not present

## 2018-09-02 NOTE — Progress Notes (Signed)
Electrophysiology TeleHealth Note   Due to national recommendations of social distancing due to COVID 19, an audio/video telehealth visit is felt to be most appropriate for this patient at this time.  See MyChart message from today for the patient's consent to telehealth for [facility].   Date:  09/02/2018   ID:  Angela [name], DOB 1970/12/31, MRN [id number]  Location: patient's home  Provider location: [address] [place]  Evaluation Performed: Follow-up visit  PCP:  Guadalupe Maple, MD  Cardiologist:   Electrophysiologist: SK   Chief Complaint:  CHB  History of Present Illness:    Angela [name] is a 48 y.o. female who presents via audio/video conferencing for a telehealth visit today.  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  Atrial tach--infrequent  Date Cr K Hgb  1019/  0.78 4.7 14.9         Anxiety and depression are much improved  She is walking dialy and has lost 30+lbs  The patient denies chest pain, shortness of breath, nocturnal dyspnea, orthopnea or peripheral edema.  There have been no palpitations, lightheadedness or syncope.     The patient denies symptoms of fevers, chills, cough, or new SOB worrisome for COVID 19.    Past Medical History:  Diagnosis Date  . Chest pain    a cath 2/10. EF 50-55% normal coronaries  . Complete heart block ([place])   . Depression   . Dysautonomia ([place])   . Dyspnea    CT negative for PE, 2007  . Hypertension   . Insomnia   . OCD (obsessive compulsive disorder)   . SVT (supraventricular tachycardia) (HCC)    s/p ablation a. c/b AV nod ablation requiring pacemaker    Past Surgical History:  Procedure Laterality Date  . BREAST BIOPSY Right 1998  . BREAST BIOPSY Right 2004  . BREAST LUMPECTOMY     RIGHT -BREAST   . ENDOMETRIAL ABLATION  2005  .  PELVIC LAPAROSCOPY  1990  . PERMANENT PACEMAKER GENERATOR CHANGE N/A 07/13/2014   MDT MRI compatible dual chamber pacemaker implanted by Dr Caryl Comes  . SPARK  2005  . TUBAL LIGATION  2003    Current Outpatient Medications  Medication Sig Dispense Refill  . Cyanocobalamin (VITAMIN B 12) 500 MCG TABS Take 1,000 mcg by mouth daily.    Marland Kitchen linaclotide (LINZESS) 72 MCG capsule Take 1 capsule (72 mcg total) by mouth daily. 90 capsule 2  . LORazepam (ATIVAN) 0.5 MG tablet TAKE 1 TABLET(0.5 MG) BY MOUTH DAILY AS NEEDED FOR ANXIETY 30 tablet 0  . lubiprostone (AMITIZA) 24 MCG capsule Take 1 capsule (24 mcg total) by mouth daily with breakfast. 90 capsule 2  . meloxicam (MOBIC) 15 MG tablet Take 1 tablet (15 mg total) by mouth daily. 90 tablet 4  . metoprolol tartrate (LOPRESSOR) 50 MG tablet Take 1 tablet (50 mg total) by mouth daily. 90 tablet 4  . Multiple Vitamins-Minerals (MULTIVITAMINS THER. W/MINERALS) TABS Take 1 tablet by mouth daily.    Marland Kitchen venlafaxine XR (EFFEXOR-XR) 150 MG 24 hr capsule Take 1 capsule (150 mg total) by mouth daily with breakfast. 90 capsule 4   No current facility-administered medications for this visit.     Allergies:   Toprol xl [metoprolol succinate]   Social History:  The patient  reports that she has  never smoked. She has never used smokeless tobacco. She reports that she does not drink alcohol or use drugs.   Family History:  The patient's   family history includes Breast cancer in her maternal aunt, paternal aunt, paternal aunt, and paternal grandmother; Cancer (age of onset: 35) in her father; Dementia in her maternal grandmother; Diabetes in her father, paternal grandfather, and paternal grandmother; Hypertension in her father and maternal grandfather.   ROS:  Please see the history of present illness.   All other systems are personally reviewed and negative.    Exam:    Vital Signs:  BP 110/73   Pulse 70   Ht 5\' 4"  (1.626 m)   Wt 163 lb (73.9 kg)   BMI 27.98  kg/m     Well appearing, alert and conversant, regular work of breathing,  good skin color Eyes- anicteric, neuro- grossly intact, skin- no apparent rash or lesions or cyanosis, mouth- oral mucosa is pink   Labs/Other Tests and Data Reviewed:    Recent Labs: 03/18/2018: ALT 19; BUN 13; Creatinine, Ser 0.78; Hemoglobin 14.9; Platelets 294; Potassium 4.7; Sodium 139; TSH 1.100   Wt Readings from Last 3 Encounters:  09/02/18 163 lb (73.9 kg)  03/18/18 162 lb 9.6 oz (73.8 kg)  01/23/18 165 lb (74.8 kg)     Other studies personally reviewed: Additional studies/ records that were reviewed today include: labs As above      Last device remote is reviewed from [facility] PDF dated 3/20 which reveals normal device function, infrequent atrial tachyarrhythmias     ASSESSMENT & PLAN:    Complete heart block  Pacemaker Medtronic     POTS  Anxiety/depression    Atrial tachycardia/  Elevated blood pressure  Ventricular sensed events question mechanism  BP is better  Dysautonomia symptoms are quiescient  infreq atrial tacy  Anxiety and depression are better with exercise and weight loss  Device function is normal      COVID 19 screen The patient denies symptoms of COVID 19 at this time.  The importance of social distancing was discussed today   Pacemaker remote 3/20 reviewed Atach in freq < 49min/yr  Assoc with V sensing Battery 5 yrs.  Follow-up:  24m  Next remote: As Scheduled   Current medicines are reviewed at length with the patient today.   The patient does not have concerns regarding her medicines.  The following changes were made today:  none  Labs/ tests ordered today include:   No orders of the defined types were placed in this encounter.   Future tests ( post COVID )     Patient Risk:  after full review of this patients clinical status, I feel that they are at moderate risk at this time.  Today, I have spent 26 minutes with the patient with  telehealth technology discussing the above.  Signed, Virl Axe, MD  09/02/2018 2:55 PM     [facility] [address] [place] [address] [phone] (office) [phone] (fax)

## 2018-09-17 ENCOUNTER — Other Ambulatory Visit: Payer: Self-pay

## 2018-09-17 ENCOUNTER — Ambulatory Visit (INDEPENDENT_AMBULATORY_CARE_PROVIDER_SITE_OTHER): Payer: Medicare Other | Admitting: Family Medicine

## 2018-09-17 ENCOUNTER — Encounter: Payer: Self-pay | Admitting: Family Medicine

## 2018-09-17 DIAGNOSIS — I951 Orthostatic hypotension: Secondary | ICD-10-CM

## 2018-09-17 DIAGNOSIS — R000 Tachycardia, unspecified: Secondary | ICD-10-CM | POA: Diagnosis not present

## 2018-09-17 DIAGNOSIS — F339 Major depressive disorder, recurrent, unspecified: Secondary | ICD-10-CM

## 2018-09-17 DIAGNOSIS — G90A Postural orthostatic tachycardia syndrome (POTS): Secondary | ICD-10-CM

## 2018-09-17 NOTE — Assessment & Plan Note (Signed)
The current medical regimen is effective;  continue present plan and medications.  

## 2018-09-17 NOTE — Progress Notes (Signed)
   There were no vitals taken for this visit.   Subjective:    Patient ID: Angela [name], female    DOB: 1971/01/13, 48 y.o.   MRN: [id number]  HPI: Angela [name] is a 48 y.o. female  Med check Telemedicine using audio/video telecommunications for a synchronous communication visit. Today's visit due to COVID-19 isolation precautions I connected with and verified that I am speaking with the correct person using two identifiers.   I discussed the limitations, risks, security and privacy concerns of performing an evaluation and management service by telecommunication and the availability of in person appointments. I also discussed with the patient that there may be a patient responsible charge related to this service. The patient expressed understanding and agreed to proceed. The patient's location is home. I am at home.  Patient follow-up all in all doing well paying attention to COVID-19 restrictions and precautions. Pacemaker doing well with no issues is going to have a slight change to the pacemaker but may be reflective of patient's walking outside more than inside. This is the routine that came before COVID-19 restrictions. Depression nerves doing okay using minimal lorazepam and depression stable. Discussed constipation issues and concerns use of medications.  Relevant past medical, surgical, family and social history reviewed and updated as indicated. Interim medical history since our last visit reviewed. Allergies and medications reviewed and updated.  Review of Systems  Constitutional: Negative.   Respiratory: Negative.   Cardiovascular: Negative.     Per HPI unless specifically indicated above     Objective:    There were no vitals taken for this visit.  Wt Readings from Last 3 Encounters:  09/02/18 163 lb (73.9 kg)  03/18/18 162 lb 9.6 oz (73.8 kg)  01/23/18 165 lb (74.8 kg)    Physical Exam      Assessment & Plan:   Problem List Items Addressed This  Visit      Cardiovascular and Mediastinum   POTS (postural orthostatic tachycardia syndrome)    The current medical regimen is effective;  continue present plan and medications.         Other   Depression, recurrent ([place])    The current medical regimen is effective;  continue present plan and medications.          I discussed the assessment and treatment plan with the patient. The patient was provided an opportunity to ask questions and all were answered. The patient agreed with the plan and demonstrated an understanding of the instructions.   The patient was advised to call back or seek an in-person evaluation if the symptoms worsen or if the condition fails to improve as anticipated.   I provided 21+ minutes of time during this encounter. Follow up plan: Return in about 3 months (around 12/17/2018).

## 2018-10-07 ENCOUNTER — Encounter: Payer: Self-pay | Admitting: Family Medicine

## 2018-10-08 ENCOUNTER — Ambulatory Visit (INDEPENDENT_AMBULATORY_CARE_PROVIDER_SITE_OTHER): Payer: Medicare Other | Admitting: Family Medicine

## 2018-10-08 ENCOUNTER — Other Ambulatory Visit: Payer: Self-pay

## 2018-10-08 ENCOUNTER — Encounter: Payer: Self-pay | Admitting: Family Medicine

## 2018-10-08 DIAGNOSIS — R000 Tachycardia, unspecified: Secondary | ICD-10-CM

## 2018-10-08 DIAGNOSIS — I951 Orthostatic hypotension: Secondary | ICD-10-CM | POA: Diagnosis not present

## 2018-10-08 DIAGNOSIS — G90A Postural orthostatic tachycardia syndrome (POTS): Secondary | ICD-10-CM

## 2018-10-08 DIAGNOSIS — F339 Major depressive disorder, recurrent, unspecified: Secondary | ICD-10-CM

## 2018-10-08 DIAGNOSIS — I4891 Unspecified atrial fibrillation: Secondary | ICD-10-CM

## 2018-10-08 NOTE — Progress Notes (Signed)
   There were no vitals taken for this visit.   Subjective:    Patient ID: Angela [name], female    DOB: Jan 26, 1971, 48 y.o.   MRN: [id number]  HPI: Angela [name] is a 48 y.o. female  Low energy  Telemedicine using audio/video telecommunications for a synchronous communication visit. Today's visit due to COVID-19 isolation precautions I connected with and verified that I am speaking with the correct person using two identifiers.   I discussed the limitations, risks, security and privacy concerns of performing an evaluation and management service by telecommunication and the availability of in person appointments. I also discussed with the patient that there may be a patient responsible charge related to this service. The patient expressed understanding and agreed to proceed. The patient's location is home. I am at home.  Discussed with patient having hard time getting out of bed getting up going and doing having a hard time going to sleep at night, great deal of anxiety over her daughter leave him in August to go to [place] as a Museum/gallery exhibitions officer in college.  Patient really after listening to her not trying to increase medication but trying to overcome with activity wants to start with having more of a schedule getting up in the morning and going to bed at night and having activities during the day.  Try and avoid use of lorazepam because it makes her sleepy. Other conditions are stable.  Which were reviewed. Relevant past medical, surgical, family and social history reviewed and updated as indicated. Interim medical history since our last visit reviewed. Allergies and medications reviewed and updated.  Review of Systems  Constitutional: Negative.   Respiratory: Negative.   Cardiovascular: Negative.     Per HPI unless specifically indicated above     Objective:    There were no vitals taken for this visit.  Wt Readings from Last 3 Encounters:  09/02/18 163 lb (73.9 kg)   03/18/18 162 lb 9.6 oz (73.8 kg)  01/23/18 165 lb (74.8 kg)    Physical Exam      Assessment & Plan:   Problem List Items Addressed This Visit      Cardiovascular and Mediastinum   POTS (postural orthostatic tachycardia syndrome)    The current medical regimen is effective;  continue present plan and medications.       Atrial fibrillation ([place])    The current medical regimen is effective;  continue present plan and medications.         Other   Depression, recurrent ([place])    Discussed depression anxiety nerves will not increase medication at this time will do better with scheduling diet nutrition and activity.         I discussed the assessment and treatment plan with the patient. The patient was provided an opportunity to ask questions and all were answered. The patient agreed with the plan and demonstrated an understanding of the instructions.   The patient was advised to call back or seek an in-person evaluation if the symptoms worsen or if the condition fails to improve as anticipated.   I provided 21+ minutes of time during this encounter. Follow up plan: Return in about 1 week (around 10/15/2018).

## 2018-10-08 NOTE — Assessment & Plan Note (Signed)
The current medical regimen is effective;  continue present plan and medications.  

## 2018-10-08 NOTE — Assessment & Plan Note (Signed)
Discussed depression anxiety nerves will not increase medication at this time will do better with scheduling diet nutrition and activity.

## 2018-10-13 ENCOUNTER — Other Ambulatory Visit: Payer: Self-pay | Admitting: Family Medicine

## 2018-10-13 DIAGNOSIS — F3289 Other specified depressive episodes: Secondary | ICD-10-CM

## 2018-10-13 NOTE — Telephone Encounter (Signed)
Requested medication (s) are due for refill today:yes  Requested medication (s) are on the active medication list:yes  Last refill: 08/17/2018  Future visit scheduled: yes  Notes to clinic:  Not delegated    Requested Prescriptions  Pending Prescriptions Disp Refills   LORazepam (ATIVAN) 0.5 MG tablet [Pharmacy Med Name: LORAZEPAM 0.5MG  TABLETS] 30 tablet     Sig: TAKE 1 TABLET(0.5 MG) BY MOUTH DAILY AS NEEDED FOR ANXIETY     Not Delegated - Psychiatry:  Anxiolytics/Hypnotics Failed - 10/13/2018 11:57 AM      Failed - This refill cannot be delegated      Failed - Urine Drug Screen completed in last 360 days.      Passed - Valid encounter within last 6 months    Recent Outpatient Visits          5 days ago Atrial fibrillation, unspecified type [facility])   [facility] Crissman, Jeannette How, MD   3 weeks ago POTS (postural orthostatic tachycardia syndrome)   [facility] Crissman, Jeannette How, MD   6 months ago Need for influenza vaccination   [facility] Guadalupe Maple, MD   1 year ago POTS (postural orthostatic tachycardia syndrome)   [facility] Crissman, Jeannette How, MD   1 year ago Need for influenza vaccination   [facility] Crissman, Jeannette How, MD      Future Appointments            Tomorrow Crissman, Jeannette How, MD [facility]

## 2018-10-14 ENCOUNTER — Ambulatory Visit (INDEPENDENT_AMBULATORY_CARE_PROVIDER_SITE_OTHER): Payer: Medicare Other | Admitting: Family Medicine

## 2018-10-14 ENCOUNTER — Other Ambulatory Visit: Payer: Self-pay

## 2018-10-14 ENCOUNTER — Encounter: Payer: Self-pay | Admitting: Family Medicine

## 2018-10-14 DIAGNOSIS — F339 Major depressive disorder, recurrent, unspecified: Secondary | ICD-10-CM

## 2018-10-14 DIAGNOSIS — K5909 Other constipation: Secondary | ICD-10-CM

## 2018-10-14 NOTE — Assessment & Plan Note (Signed)
Discussed stress depression and medications patient taking more lorazepam gave refill. Discussed separation issues with her daughter going off to school.

## 2018-10-14 NOTE — Progress Notes (Signed)
   There were no vitals taken for this visit.   Subjective:    Patient ID: Angela [name], female    DOB: Dec 20, 1970, 48 y.o.   MRN: [id number]  HPI: Angela [name] is a 48 y.o. female  Med f/u Telemedicine using audio/video telecommunications for a synchronous communication visit. Today's visit due to COVID-19 isolation precautions I connected with and verified that I am speaking with the correct person using two identifiers.   I discussed the limitations, risks, security and privacy concerns of performing an evaluation and management service by telecommunication and the availability of in person appointments. I also discussed with the patient that there may be a patient responsible charge related to this service. The patient expressed understanding and agreed to proceed. The patient's location is home. I am at home.  Discussed with patient multiple concerns of separation and COVID-19. Patient's week's been okay is been taking more lorazepam at nighttime to get her mind to shut down. Is also been having constipation issues and been taking more of her constipation medication.  Relevant past medical, surgical, family and social history reviewed and updated as indicated. Interim medical history since our last visit reviewed. Allergies and medications reviewed and updated.  Review of Systems  Constitutional: Negative.   Respiratory: Negative.   Cardiovascular: Negative.     Per HPI unless specifically indicated above     Objective:    There were no vitals taken for this visit.  Wt Readings from Last 3 Encounters:  09/02/18 163 lb (73.9 kg)  03/18/18 162 lb 9.6 oz (73.8 kg)  01/23/18 165 lb (74.8 kg)    Physical Exam      Assessment & Plan:   Problem List Items Addressed This Visit      Digestive   Chronic constipation    Discussed chronic constipation issues and medications patient can increase medication safely and discussed time to go back to GI for further  evaluation.        Other   Depression, recurrent ([place])    Discussed stress depression and medications patient taking more lorazepam gave refill. Discussed separation issues with her daughter going off to school.         I discussed the assessment and treatment plan with the patient. The patient was provided an opportunity to ask questions and all were answered. The patient agreed with the plan and demonstrated an understanding of the instructions.   The patient was advised to call back or seek an in-person evaluation if the symptoms worsen or if the condition fails to improve as anticipated.   I provided 21+ minutes of time during this encounter.  Follow up plan: Return in about 4 weeks (around 11/11/2018), or if symptoms worsen or fail to improve.

## 2018-10-14 NOTE — Assessment & Plan Note (Signed)
Discussed chronic constipation issues and medications patient can increase medication safely and discussed time to go back to GI for further evaluation.

## 2018-10-15 ENCOUNTER — Encounter: Payer: Self-pay | Admitting: Family Medicine

## 2018-11-10 ENCOUNTER — Ambulatory Visit (INDEPENDENT_AMBULATORY_CARE_PROVIDER_SITE_OTHER): Payer: Medicare Other | Admitting: *Deleted

## 2018-11-10 DIAGNOSIS — I442 Atrioventricular block, complete: Secondary | ICD-10-CM

## 2018-11-10 DIAGNOSIS — I4891 Unspecified atrial fibrillation: Secondary | ICD-10-CM

## 2018-11-11 LAB — CUP PACEART REMOTE DEVICE CHECK
Battery Remaining Longevity: 60 mo
Battery Voltage: 3 V
Brady Statistic AP VP Percent: 21.97 %
Brady Statistic AP VS Percent: 0.05 %
Brady Statistic AS VP Percent: 77.97 %
Brady Statistic AS VS Percent: 0.01 %
Brady Statistic RA Percent Paced: 21.99 %
Brady Statistic RV Percent Paced: 99.92 %
Date Time Interrogation Session: 20200609220504
Implantable Lead Implant Date: 20070319
Implantable Lead Implant Date: 20070319
Implantable Lead Location: 753859
Implantable Lead Location: 753860
Implantable Lead Model: 5076
Implantable Lead Model: 5076
Implantable Pulse Generator Implant Date: 20160210
Lead Channel Impedance Value: 285 Ohm
Lead Channel Impedance Value: 399 Ohm
Lead Channel Impedance Value: 399 Ohm
Lead Channel Impedance Value: 437 Ohm
Lead Channel Pacing Threshold Amplitude: 0.75 V
Lead Channel Pacing Threshold Amplitude: 0.75 V
Lead Channel Pacing Threshold Pulse Width: 0.4 ms
Lead Channel Pacing Threshold Pulse Width: 0.4 ms
Lead Channel Sensing Intrinsic Amplitude: 3.375 mV
Lead Channel Sensing Intrinsic Amplitude: 3.375 mV
Lead Channel Sensing Intrinsic Amplitude: 4.25 mV
Lead Channel Sensing Intrinsic Amplitude: 4.25 mV
Lead Channel Setting Pacing Amplitude: 2 V
Lead Channel Setting Pacing Amplitude: 2.5 V
Lead Channel Setting Pacing Pulse Width: 0.4 ms
Lead Channel Setting Sensing Sensitivity: 2 mV

## 2018-11-18 NOTE — Progress Notes (Signed)
Remote pacemaker transmission.   

## 2018-12-03 ENCOUNTER — Telehealth: Payer: Medicare Other | Admitting: Nurse Practitioner

## 2018-12-07 ENCOUNTER — Ambulatory Visit: Payer: Self-pay | Admitting: Family Medicine

## 2018-12-21 ENCOUNTER — Ambulatory Visit (INDEPENDENT_AMBULATORY_CARE_PROVIDER_SITE_OTHER): Payer: Medicare Other | Admitting: Family Medicine

## 2018-12-21 ENCOUNTER — Encounter: Payer: Self-pay | Admitting: Family Medicine

## 2018-12-21 ENCOUNTER — Other Ambulatory Visit: Payer: Self-pay

## 2018-12-21 DIAGNOSIS — Z1211 Encounter for screening for malignant neoplasm of colon: Secondary | ICD-10-CM

## 2018-12-21 DIAGNOSIS — F339 Major depressive disorder, recurrent, unspecified: Secondary | ICD-10-CM

## 2018-12-21 DIAGNOSIS — I4891 Unspecified atrial fibrillation: Secondary | ICD-10-CM

## 2018-12-21 NOTE — Assessment & Plan Note (Signed)
Continue current medications. 

## 2018-12-21 NOTE — Assessment & Plan Note (Signed)
Discussed with patient and continue Lizness

## 2018-12-21 NOTE — Assessment & Plan Note (Signed)
The current medical regimen is effective;  continue present plan and medications.  

## 2018-12-21 NOTE — Progress Notes (Signed)
   There were no vitals taken for this visit.   Subjective:    Patient ID: Angela [name], female    DOB: [birth year], 48 y.o.   MRN: [id number]  HPI: Angela [name] is a 48 y.o. female  Med check Discussed with patient all in all doing better.  Constipation issues persist and has not gotten appointment with gastroenterology for colonoscopy yet so will make appointment again. Constipation issues stable as long as takes Lizness. Anxiety with daughter going off to college has stabilized. Heart rate and cardiac condition are stable. Relevant past medical, surgical, family and social history reviewed and updated as indicated. Interim medical history since our last visit reviewed. Allergies and medications reviewed and updated.  Review of Systems  Constitutional: Negative.   Respiratory: Negative.   Cardiovascular: Negative.     Per HPI unless specifically indicated above     Objective:    There were no vitals taken for this visit.  Wt Readings from Last 3 Encounters:  09/02/18 163 lb (73.9 kg)  03/18/18 162 lb 9.6 oz (73.8 kg)  01/23/18 165 lb (74.8 kg)    Physical Exam      Assessment & Plan:   Problem List Items Addressed This Visit      Cardiovascular and Mediastinum   Atrial fibrillation ([place])    The current medical regimen is effective;  continue present plan and medications.         Other   Depression, recurrent ([place])    Continue current medications       Other Visit Diagnoses    Encounter for screening colonoscopy    -  Primary   Relevant Orders   Ambulatory referral to Gastroenterology       Telemedicine using audio/video telecommunications for a synchronous communication visit. Today's visit due to COVID-19 isolation precautions I connected with and verified that I am speaking with the correct person using two identifiers.   I discussed the limitations, risks, security and privacy concerns of performing an evaluation and management service  by telecommunication and the availability of in person appointments. I also discussed with the patient that there may be a patient responsible charge related to this service. The patient expressed understanding and agreed to proceed. The patient's location is home. I am at home.   I discussed the assessment and treatment plan with the patient. The patient was provided an opportunity to ask questions and all were answered. The patient agreed with the plan and demonstrated an understanding of the instructions.   The patient was advised to call back or seek an in-person evaluation if the symptoms worsen or if the condition fails to improve as anticipated.   I provided 21+ minutes of time during this encounter. Follow up plan: Return in about 4 weeks (around 01/18/2019).

## 2018-12-28 ENCOUNTER — Encounter: Payer: Self-pay | Admitting: *Deleted

## 2019-01-21 ENCOUNTER — Encounter: Payer: Self-pay | Admitting: Family Medicine

## 2019-01-21 ENCOUNTER — Other Ambulatory Visit: Payer: Self-pay

## 2019-01-21 ENCOUNTER — Ambulatory Visit (INDEPENDENT_AMBULATORY_CARE_PROVIDER_SITE_OTHER): Payer: Medicare Other | Admitting: Family Medicine

## 2019-01-21 DIAGNOSIS — G901 Familial dysautonomia [Riley-Day]: Secondary | ICD-10-CM | POA: Diagnosis not present

## 2019-01-21 DIAGNOSIS — I4891 Unspecified atrial fibrillation: Secondary | ICD-10-CM

## 2019-01-21 DIAGNOSIS — F429 Obsessive-compulsive disorder, unspecified: Secondary | ICD-10-CM

## 2019-01-21 DIAGNOSIS — F339 Major depressive disorder, recurrent, unspecified: Secondary | ICD-10-CM | POA: Diagnosis not present

## 2019-01-21 NOTE — Assessment & Plan Note (Signed)
The current medical regimen is effective;  continue present plan and medications.  

## 2019-01-21 NOTE — Progress Notes (Signed)
   Wt 158 lb (71.7 kg)   BMI 27.12 kg/m    Subjective:    Patient ID: Angela [name], female    DOB: 1970-06-20, 48 y.o.   MRN: [id number]  HPI: Angela [name] is a 48 y.o. female  Med check Reviewed patient's daughter going off to school in [place] next week.  Patient took 2 lorazepam yesterday but otherwise is been doing well anxiety is well controlled with activities mostly and continued limited use of medications. Has activities planned after she gets home from dropping her daughter off and all in all sounds very good. Relevant past medical, surgical, family and social history reviewed and updated as indicated. Interim medical history since our last visit reviewed. Allergies and medications reviewed and updated.  Review of Systems  Constitutional: Negative.   Respiratory: Negative.   Cardiovascular: Negative.     Per HPI unless specifically indicated above     Objective:    Wt 158 lb (71.7 kg)   BMI 27.12 kg/m   Wt Readings from Last 3 Encounters:  01/21/19 158 lb (71.7 kg)  09/02/18 163 lb (73.9 kg)  03/18/18 162 lb 9.6 oz (73.8 kg)    Physical Exam      Assessment & Plan:   Problem List Items Addressed This Visit      Cardiovascular and Mediastinum   Atrial fibrillation ([place])    The current medical regimen is effective;  continue present plan and medications.         Other   Depression, recurrent ([place])    Reviewed with patient in spite of great deal of stress with daughter going off to college next week patient doing well      OCD (obsessive compulsive disorder)    The current medical regimen is effective;  continue present plan and medications.        Other Visit Diagnoses    Dysautonomia ([place])   (Chronic)       Telemedicine using audio/video telecommunications for a synchronous communication visit. Today's visit due to COVID-19 isolation precautions I connected with and verified that I am speaking with the correct person using  two identifiers.   I discussed the limitations, risks, security and privacy concerns of performing an evaluation and management service by telecommunication and the availability of in person appointments. I also discussed with the patient that there may be a patient responsible charge related to this service. The patient expressed understanding and agreed to proceed. The patient's location is home. I am at home.   I discussed the assessment and treatment plan with the patient. The patient was provided an opportunity to ask questions and all were answered. The patient agreed with the plan and demonstrated an understanding of the instructions.   The patient was advised to call back or seek an in-person evaluation if the symptoms worsen or if the condition fails to improve as anticipated.   I provided 21+ minutes of time during this encounter. Follow up plan: Return in about 3 weeks (around 02/11/2019).

## 2019-01-21 NOTE — Assessment & Plan Note (Signed)
Reviewed with patient in spite of great deal of stress with daughter going off to college next week patient doing well

## 2019-02-10 ENCOUNTER — Ambulatory Visit (INDEPENDENT_AMBULATORY_CARE_PROVIDER_SITE_OTHER): Payer: Medicare Other | Admitting: *Deleted

## 2019-02-10 DIAGNOSIS — I4891 Unspecified atrial fibrillation: Secondary | ICD-10-CM

## 2019-02-10 DIAGNOSIS — I442 Atrioventricular block, complete: Secondary | ICD-10-CM

## 2019-02-10 LAB — CUP PACEART REMOTE DEVICE CHECK
Battery Remaining Longevity: 54 mo
Battery Voltage: 2.99 V
Brady Statistic AP VP Percent: 12.55 %
Brady Statistic AP VS Percent: 0.2 %
Brady Statistic AS VP Percent: 87.21 %
Brady Statistic AS VS Percent: 0.04 %
Brady Statistic RA Percent Paced: 12.69 %
Brady Statistic RV Percent Paced: 99.69 %
Date Time Interrogation Session: 20200909095437
Implantable Lead Implant Date: 20070319
Implantable Lead Implant Date: 20070319
Implantable Lead Location: 753859
Implantable Lead Location: 753860
Implantable Lead Model: 5076
Implantable Lead Model: 5076
Implantable Pulse Generator Implant Date: 20160210
Lead Channel Impedance Value: 285 Ohm
Lead Channel Impedance Value: 399 Ohm
Lead Channel Impedance Value: 399 Ohm
Lead Channel Impedance Value: 475 Ohm
Lead Channel Pacing Threshold Amplitude: 0.625 V
Lead Channel Pacing Threshold Amplitude: 0.75 V
Lead Channel Pacing Threshold Pulse Width: 0.4 ms
Lead Channel Pacing Threshold Pulse Width: 0.4 ms
Lead Channel Sensing Intrinsic Amplitude: 3.875 mV
Lead Channel Sensing Intrinsic Amplitude: 3.875 mV
Lead Channel Sensing Intrinsic Amplitude: 4 mV
Lead Channel Sensing Intrinsic Amplitude: 4 mV
Lead Channel Setting Pacing Amplitude: 2 V
Lead Channel Setting Pacing Amplitude: 2.5 V
Lead Channel Setting Pacing Pulse Width: 0.4 ms
Lead Channel Setting Sensing Sensitivity: 2 mV

## 2019-02-11 ENCOUNTER — Ambulatory Visit: Payer: Self-pay | Admitting: Family Medicine

## 2019-02-25 NOTE — Progress Notes (Signed)
Remote pacemaker transmission.   

## 2019-03-03 ENCOUNTER — Other Ambulatory Visit: Payer: Self-pay | Admitting: Family Medicine

## 2019-03-03 DIAGNOSIS — G90A Postural orthostatic tachycardia syndrome (POTS): Secondary | ICD-10-CM

## 2019-03-17 ENCOUNTER — Encounter: Payer: Self-pay | Admitting: Family Medicine

## 2019-03-19 ENCOUNTER — Other Ambulatory Visit: Payer: Self-pay

## 2019-03-19 ENCOUNTER — Other Ambulatory Visit: Payer: Medicare Other

## 2019-03-19 ENCOUNTER — Ambulatory Visit (INDEPENDENT_AMBULATORY_CARE_PROVIDER_SITE_OTHER): Payer: Medicare Other

## 2019-03-19 ENCOUNTER — Other Ambulatory Visit: Payer: Self-pay | Admitting: Family Medicine

## 2019-03-19 VITALS — Wt 156.0 lb

## 2019-03-19 DIAGNOSIS — Z23 Encounter for immunization: Secondary | ICD-10-CM | POA: Diagnosis not present

## 2019-03-19 DIAGNOSIS — Z0000 Encounter for general adult medical examination without abnormal findings: Secondary | ICD-10-CM

## 2019-03-19 LAB — URINALYSIS, ROUTINE W REFLEX MICROSCOPIC
Bilirubin, UA: NEGATIVE
Glucose, UA: NEGATIVE
Ketones, UA: NEGATIVE
Leukocytes,UA: NEGATIVE
Nitrite, UA: NEGATIVE
Protein,UA: NEGATIVE
RBC, UA: NEGATIVE
Specific Gravity, UA: 1.02 (ref 1.005–1.030)
Urobilinogen, Ur: 0.2 mg/dL (ref 0.2–1.0)
pH, UA: 8.5 — ABNORMAL HIGH (ref 5.0–7.5)

## 2019-03-20 LAB — CBC WITH DIFFERENTIAL/PLATELET
Basophils Absolute: 0 10*3/uL (ref 0.0–0.2)
Basos: 1 %
EOS (ABSOLUTE): 0 10*3/uL (ref 0.0–0.4)
Eos: 1 %
Hematocrit: 43.9 % (ref 34.0–46.6)
Hemoglobin: 14.4 g/dL (ref 11.1–15.9)
Immature Grans (Abs): 0 10*3/uL (ref 0.0–0.1)
Immature Granulocytes: 0 %
Lymphocytes Absolute: 2.1 10*3/uL (ref 0.7–3.1)
Lymphs: 32 %
MCH: 30 pg (ref 26.6–33.0)
MCHC: 32.8 g/dL (ref 31.5–35.7)
MCV: 92 fL (ref 79–97)
Monocytes Absolute: 0.4 10*3/uL (ref 0.1–0.9)
Monocytes: 6 %
Neutrophils Absolute: 3.9 10*3/uL (ref 1.4–7.0)
Neutrophils: 60 %
Platelets: 298 10*3/uL (ref 150–450)
RBC: 4.8 x10E6/uL (ref 3.77–5.28)
RDW: 11.9 % (ref 11.7–15.4)
WBC: 6.4 10*3/uL (ref 3.4–10.8)

## 2019-03-20 LAB — LIPID PANEL
Chol/HDL Ratio: 3 ratio (ref 0.0–4.4)
Cholesterol, Total: 186 mg/dL (ref 100–199)
HDL: 61 mg/dL (ref >39)
LDL Chol Calc (NIH): 108 mg/dL — ABNORMAL HIGH (ref 0–99)
Triglycerides: 96 mg/dL (ref 0–149)
VLDL Cholesterol Cal: 17 mg/dL (ref 5–40)

## 2019-03-20 LAB — COMPREHENSIVE METABOLIC PANEL
ALT: 18 IU/L (ref 0–32)
AST: 26 IU/L (ref 0–40)
Albumin/Globulin Ratio: 1.9 (ref 1.2–2.2)
Albumin: 4.3 g/dL (ref 3.8–4.8)
Alkaline Phosphatase: 99 IU/L (ref 39–117)
BUN/Creatinine Ratio: 20 (ref 9–23)
BUN: 17 mg/dL (ref 6–24)
Bilirubin Total: 0.5 mg/dL (ref 0.0–1.2)
CO2: 22 mmol/L (ref 20–29)
Calcium: 9.4 mg/dL (ref 8.7–10.2)
Chloride: 103 mmol/L (ref 96–106)
Creatinine, Ser: 0.83 mg/dL (ref 0.57–1.00)
GFR calc Af Amer: 97 mL/min/{1.73_m2} (ref >59)
GFR calc non Af Amer: 84 mL/min/{1.73_m2} (ref >59)
Globulin, Total: 2.3 g/dL (ref 1.5–4.5)
Glucose: 79 mg/dL (ref 65–99)
Potassium: 4.7 mmol/L (ref 3.5–5.2)
Sodium: 141 mmol/L (ref 134–144)
Total Protein: 6.6 g/dL (ref 6.0–8.5)

## 2019-03-20 LAB — TSH: TSH: 1.16 u[IU]/mL (ref 0.450–4.500)

## 2019-03-22 ENCOUNTER — Ambulatory Visit: Payer: Medicare Other | Admitting: Family Medicine

## 2019-03-23 ENCOUNTER — Encounter: Payer: Self-pay | Admitting: Family Medicine

## 2019-03-23 ENCOUNTER — Ambulatory Visit (INDEPENDENT_AMBULATORY_CARE_PROVIDER_SITE_OTHER): Payer: Medicare Other | Admitting: Family Medicine

## 2019-03-23 ENCOUNTER — Other Ambulatory Visit: Payer: Self-pay

## 2019-03-23 DIAGNOSIS — F339 Major depressive disorder, recurrent, unspecified: Secondary | ICD-10-CM

## 2019-03-23 DIAGNOSIS — I442 Atrioventricular block, complete: Secondary | ICD-10-CM

## 2019-03-23 DIAGNOSIS — I498 Other specified cardiac arrhythmias: Secondary | ICD-10-CM

## 2019-03-23 DIAGNOSIS — F3342 Major depressive disorder, recurrent, in full remission: Secondary | ICD-10-CM

## 2019-03-23 DIAGNOSIS — F429 Obsessive-compulsive disorder, unspecified: Secondary | ICD-10-CM

## 2019-03-23 DIAGNOSIS — G90A Postural orthostatic tachycardia syndrome (POTS): Secondary | ICD-10-CM

## 2019-03-23 MED ORDER — LUBIPROSTONE 24 MCG PO CAPS: 24.0000 ug | ORAL_CAPSULE | Freq: Every day | ORAL | 4 refills | Status: DC

## 2019-03-23 MED ORDER — MELOXICAM 15 MG PO TABS: 15.0000 mg | ORAL_TABLET | Freq: Every day | ORAL | 4 refills | Status: DC

## 2019-03-23 MED ORDER — METOPROLOL TARTRATE 50 MG PO TABS: 50.0000 mg | ORAL_TABLET | Freq: Every day | ORAL | 4 refills | Status: DC

## 2019-03-23 MED ORDER — LINACLOTIDE 72 MCG PO CAPS: 72.0000 ug | ORAL_CAPSULE | Freq: Every day | ORAL | 2 refills | Status: DC

## 2019-03-23 MED ORDER — VENLAFAXINE HCL ER 150 MG PO CP24: 150.0000 mg | ORAL_CAPSULE | Freq: Every day | ORAL | 4 refills | Status: DC

## 2019-03-23 NOTE — Assessment & Plan Note (Signed)
Stable. The current medical regimen is effective;  continue present plan and medications.  

## 2019-03-23 NOTE — Assessment & Plan Note (Signed)
On pacemaker

## 2019-03-23 NOTE — Assessment & Plan Note (Signed)
The current medical regimen is effective;  continue present plan and medications.  

## 2019-03-23 NOTE — Progress Notes (Signed)
There were no vitals taken for this visit.   Subjective:    Patient ID: Angela [name], female    DOB: 1970-11-27, 48 y.o.   MRN: [id number]  HPI: Angela [name] is a 48 y.o. female  Med check Discussed with patient all in all doing well dysautonomia is stable especially with walking every day. OCD anxieties doing as well as can be expected daughter is doing well as a Museum/gallery exhibitions officer in [place]. Constipation stable takes Amitiza every day with an occasional Lizness. Pacemaker battery may be depleting a little faster than usual may need pacemaker battery in several years.   Relevant past medical, surgical, family and social history reviewed and updated as indicated. Interim medical history since our last visit reviewed. Allergies and medications reviewed and updated.  Review of Systems  Constitutional: Negative.   Respiratory: Negative.   Cardiovascular: Negative.     Per HPI unless specifically indicated above     Objective:    There were no vitals taken for this visit.  Wt Readings from Last 3 Encounters:  03/19/19 156 lb (70.8 kg)  01/21/19 158 lb (71.7 kg)  09/02/18 163 lb (73.9 kg)    Physical Exam  Results for orders placed or performed in visit on 03/19/19  Urinalysis, Routine w reflex microscopic  Result Value Ref Range   Specific Gravity, UA 1.020 1.005 - 1.030   pH, UA 8.5 (H) 5.0 - 7.5   Color, UA Yellow Yellow   Appearance Ur Clear Clear   Leukocytes,UA Negative Negative   Protein,UA Negative Negative/Trace   Glucose, UA Negative Negative   Ketones, UA Negative Negative   RBC, UA Negative Negative   Bilirubin, UA Negative Negative   Urobilinogen, Ur 0.2 0.2 - 1.0 mg/dL   Nitrite, UA Negative Negative  TSH  Result Value Ref Range   TSH 1.160 0.450 - 4.500 uIU/mL  CBC with Differential/Platelet  Result Value Ref Range   WBC 6.4 3.4 - 10.8 x10E3/uL   RBC 4.80 3.77 - 5.28 x10E6/uL   Hemoglobin 14.4 11.1 - 15.9 g/dL   Hematocrit 43.9 34.0 -  46.6 %   MCV 92 79 - 97 fL   MCH 30.0 26.6 - 33.0 pg   MCHC 32.8 31.5 - 35.7 g/dL   RDW 11.9 11.7 - 15.4 %   Platelets 298 150 - 450 x10E3/uL   Neutrophils 60 Not Estab. %   Lymphs 32 Not Estab. %   Monocytes 6 Not Estab. %   Eos 1 Not Estab. %   Basos 1 Not Estab. %   Neutrophils Absolute 3.9 1.4 - 7.0 x10E3/uL   Lymphocytes Absolute 2.1 0.7 - 3.1 x10E3/uL   Monocytes Absolute 0.4 0.1 - 0.9 x10E3/uL   EOS (ABSOLUTE) 0.0 0.0 - 0.4 x10E3/uL   Basophils Absolute 0.0 0.0 - 0.2 x10E3/uL   Immature Granulocytes 0 Not Estab. %   Immature Grans (Abs) 0.0 0.0 - 0.1 x10E3/uL  Lipid panel  Result Value Ref Range   Cholesterol, Total 186 100 - 199 mg/dL   Triglycerides 96 0 - 149 mg/dL   HDL 61 >39 mg/dL   VLDL Cholesterol Cal 17 5 - 40 mg/dL   LDL Chol Calc (NIH) 108 (H) 0 - 99 mg/dL   Chol/HDL Ratio 3.0 0.0 - 4.4 ratio  Comprehensive metabolic panel  Result Value Ref Range   Glucose 79 65 - 99 mg/dL   BUN 17 6 - 24 mg/dL   Creatinine, Ser 0.83 0.57 - 1.00 mg/dL  GFR calc non Af Amer 84 >59 mL/min/1.73   GFR calc Af Amer 97 >59 mL/min/1.73   BUN/Creatinine Ratio 20 9 - 23   Sodium 141 134 - 144 mmol/L   Potassium 4.7 3.5 - 5.2 mmol/L   Chloride 103 96 - 106 mmol/L   CO2 22 20 - 29 mmol/L   Calcium 9.4 8.7 - 10.2 mg/dL   Total Protein 6.6 6.0 - 8.5 g/dL   Albumin 4.3 3.8 - 4.8 g/dL   Globulin, Total 2.3 1.5 - 4.5 g/dL   Albumin/Globulin Ratio 1.9 1.2 - 2.2   Bilirubin Total 0.5 0.0 - 1.2 mg/dL   Alkaline Phosphatase 99 39 - 117 IU/L   AST 26 0 - 40 IU/L   ALT 18 0 - 32 IU/L      Assessment & Plan:   Problem List Items Addressed This Visit      Cardiovascular and Mediastinum   AV BLOCK, COMPLETE    On pacemaker      Relevant Medications   metoprolol tartrate (LOPRESSOR) 50 MG tablet   POTS (postural orthostatic tachycardia syndrome)    Stable The current medical regimen is effective;  continue present plan and medications.       Relevant Medications    metoprolol tartrate (LOPRESSOR) 50 MG tablet   lubiprostone (AMITIZA) 24 MCG capsule   linaclotide (LINZESS) 72 MCG capsule     Other   Depression, recurrent ([facility])    The current medical regimen is effective;  continue present plan and medications.       Relevant Medications   venlafaxine XR (EFFEXOR-XR) 150 MG 24 hr capsule   OCD (obsessive compulsive disorder)    The current medical regimen is effective;  continue present plan and medications.       Relevant Medications   venlafaxine XR (EFFEXOR-XR) 150 MG 24 hr capsule    Other Visit Diagnoses    Recurrent major depressive disorder, in full remission ([place])       Relevant Medications   venlafaxine XR (EFFEXOR-XR) 150 MG 24 hr capsule       Telemedicine using audio/video telecommunications for a synchronous communication visit. Today's visit due to COVID-19 isolation precautions I connected with and verified that I am speaking with the correct person using two identifiers.   I discussed the limitations, risks, security and privacy concerns of performing an evaluation and management service by telecommunication and the availability of in person appointments. I also discussed with the patient that there may be a patient responsible charge related to this service. The patient expressed understanding and agreed to proceed. The patient's location is  Home. I am at home.   I discussed the assessment and treatment plan with the patient. The patient was provided an opportunity to ask questions and all were answered. The patient agreed with the plan and demonstrated an understanding of the instructions.   The patient was advised to call back or seek an in-person evaluation if the symptoms worsen or if the condition fails to improve as anticipated.   I provided 21+ minutes of time during this encounter. Follow up plan: Return for Physical Exam.

## 2019-05-12 ENCOUNTER — Ambulatory Visit (INDEPENDENT_AMBULATORY_CARE_PROVIDER_SITE_OTHER): Payer: Medicare Other | Admitting: *Deleted

## 2019-05-12 DIAGNOSIS — I442 Atrioventricular block, complete: Secondary | ICD-10-CM | POA: Diagnosis not present

## 2019-05-12 LAB — CUP PACEART REMOTE DEVICE CHECK
Battery Remaining Longevity: 53 mo
Battery Voltage: 2.99 V
Brady Statistic AP VP Percent: 12.7 %
Brady Statistic AP VS Percent: 0.06 %
Brady Statistic AS VP Percent: 87.21 %
Brady Statistic AS VS Percent: 0.02 %
Brady Statistic RA Percent Paced: 12.74 %
Brady Statistic RV Percent Paced: 99.87 %
Date Time Interrogation Session: 20201209094330
Implantable Lead Implant Date: 20070319
Implantable Lead Implant Date: 20070319
Implantable Lead Location: 753859
Implantable Lead Location: 753860
Implantable Lead Model: 5076
Implantable Lead Model: 5076
Implantable Pulse Generator Implant Date: 20160210
Lead Channel Impedance Value: 285 Ohm
Lead Channel Impedance Value: 399 Ohm
Lead Channel Impedance Value: 418 Ohm
Lead Channel Impedance Value: 475 Ohm
Lead Channel Pacing Threshold Amplitude: 0.75 V
Lead Channel Pacing Threshold Amplitude: 0.75 V
Lead Channel Pacing Threshold Pulse Width: 0.4 ms
Lead Channel Pacing Threshold Pulse Width: 0.4 ms
Lead Channel Sensing Intrinsic Amplitude: 4 mV
Lead Channel Sensing Intrinsic Amplitude: 4 mV
Lead Channel Sensing Intrinsic Amplitude: 4.875 mV
Lead Channel Sensing Intrinsic Amplitude: 4.875 mV
Lead Channel Setting Pacing Amplitude: 2 V
Lead Channel Setting Pacing Amplitude: 2.5 V
Lead Channel Setting Pacing Pulse Width: 0.4 ms
Lead Channel Setting Sensing Sensitivity: 2 mV

## 2019-05-20 ENCOUNTER — Other Ambulatory Visit: Payer: Self-pay

## 2019-05-20 DIAGNOSIS — F3289 Other specified depressive episodes: Secondary | ICD-10-CM

## 2019-05-20 NOTE — Telephone Encounter (Signed)
Refill request for Lorazepam LOV 03/23/19 Next Appt: 07/09/2019 with Dr. Wynetta Emery

## 2019-05-25 MED ORDER — LORAZEPAM 0.5 MG PO TABS: ORAL_TABLET | ORAL | 0 refills | Status: DC

## 2019-06-19 NOTE — Progress Notes (Signed)
PPM remote 

## 2019-07-02 ENCOUNTER — Encounter: Payer: Medicare Other | Admitting: Family Medicine

## 2019-07-08 ENCOUNTER — Ambulatory Visit (INDEPENDENT_AMBULATORY_CARE_PROVIDER_SITE_OTHER): Payer: Medicare PPO

## 2019-07-08 VITALS — Ht 65.0 in | Wt 158.0 lb

## 2019-07-08 DIAGNOSIS — Z1231 Encounter for screening mammogram for malignant neoplasm of breast: Secondary | ICD-10-CM | POA: Diagnosis not present

## 2019-07-08 DIAGNOSIS — Z0000 Encounter for general adult medical examination without abnormal findings: Secondary | ICD-10-CM

## 2019-07-08 NOTE — Progress Notes (Signed)
Subjective:   Angela [name] is a 49 y.o. female who presents for Medicare Annual (Subsequent) preventive examination.  This visit is being conducted via phone call  - after an attmept to do on video chat - due to the COVID-19 pandemic. This patient has given me verbal consent via phone to conduct this visit, patient states they are participating from their home address. Some vital signs may be absent or patient reported.   Patient identification: identified by name, DOB, and current address.    Review of Systems:   Cardiac Risk Factors include: advanced age (>81men, >44 women);dyslipidemia;hypertension     Objective:     Vitals: Ht 5\' 5"  (1.651 m)   Wt 158 lb (71.7 kg)   BMI 26.29 kg/m   Body mass index is 26.29 kg/m.  Advanced Directives 07/08/2019 01/23/2018 01/17/2017 12/11/2015 07/13/2014  Does Patient Have a Medical Advance Directive? Yes Yes No No No  Type of Advance Directive Living will;Healthcare Power of [place];Living will - - -  Copy of [place] in Chart? No - copy requested No - copy requested - - -  Would patient like information on creating a medical advance directive? - - No - Patient declined No - patient declined information No - patient declined information    Tobacco Social History   Tobacco Use  Smoking Status Never Smoker  Smokeless Tobacco Never Used     Counseling given: Not Answered   Clinical Intake:  Pre-visit preparation completed: Yes  Pain : 0-10 Pain Score: 5  Pain Type: Acute pain Pain Location: Heel Pain Orientation: Left Pain Descriptors / Indicators: Aching, Stabbing, Dull, Constant Pain Onset: 1 to 4 weeks ago Pain Frequency: Intermittent Pain Relieving Factors: resting Effect of Pain on Daily Activities: affecting walking  Pain Relieving Factors: resting  Nutritional Status: BMI 25 -29 Overweight Nutritional Risks: None Diabetes: No  How often do you need to have  someone help you when you read instructions, pamphlets, or other written materials from your doctor or pharmacy?: 1 - Never  Interpreter Needed?: No  Information entered by :: Kelin Borum,LPN  Past Medical History:  Diagnosis Date  . Chest pain    a cath 2/10. EF 50-55% normal coronaries  . Complete heart block ([place])   . Depression   . Dysautonomia ([place])   . Dyspnea    CT negative for PE, 2007  . Hypertension   . Insomnia   . OCD (obsessive compulsive disorder)   . SVT (supraventricular tachycardia) ([facility])    s/p ablation a. c/b AV nod ablation requiring pacemaker   Past Surgical History:  Procedure Laterality Date  . BREAST BIOPSY Right 1998  . BREAST BIOPSY Right 2004  . BREAST LUMPECTOMY     RIGHT -BREAST   . ENDOMETRIAL ABLATION  2005  . PELVIC LAPAROSCOPY  1990  . PERMANENT PACEMAKER GENERATOR CHANGE N/A 07/13/2014   MDT MRI compatible dual chamber pacemaker implanted by Dr Caryl Comes  . SPARK  2005  . TUBAL LIGATION  2003   Family History  Problem Relation Age of Onset  . Hypertension Father   . Cancer Father 27       COLON  . Diabetes Father   . Breast cancer Paternal Aunt   . Hypertension Maternal Grandfather   . Diabetes Paternal Grandmother   . Breast cancer Paternal Grandmother   . Diabetes Paternal Grandfather   . Breast cancer Maternal Aunt  mastectomy  . Dementia Maternal Grandmother   . Breast cancer Paternal Aunt    Social History   Socioeconomic History  . Marital status: Married    Spouse name: Not on file  . Number of children: Not on file  . Years of education: Not on file  . Highest education level: Bachelor's degree (e.g., BA, AB, BS)  Occupational History  . Occupation: disability   Tobacco Use  . Smoking status: Never Smoker  . Smokeless tobacco: Never Used  Substance and Sexual Activity  . Alcohol use: No  . Drug use: No  . Sexual activity: Yes    Birth control/protection: Surgical  Other Topics Concern  . Not on file    Social History Narrative   Work with daughters school    Social Determinants of Health   Financial Resource Strain:   . Difficulty of Paying Living Expenses: Not on file  Food Insecurity:   . Worried About Charity fundraiser in the Last Year: Not on file  . Ran Out of Food in the Last Year: Not on file  Transportation Needs:   . Lack of Transportation (Medical): Not on file  . Lack of Transportation (Non-Medical): Not on file  Physical Activity:   . Days of Exercise per Week: Not on file  . Minutes of Exercise per Session: Not on file  Stress:   . Feeling of Stress : Not on file  Social Connections:   . Frequency of Communication with Friends and Family: Not on file  . Frequency of Social Gatherings with Friends and Family: Not on file  . Attends Religious Services: Not on file  . Active Member of Clubs or Organizations: Not on file  . Attends Archivist Meetings: Not on file  . Marital Status: Not on file    Outpatient Encounter Medications as of 07/08/2019  Medication Sig  . Cyanocobalamin (VITAMIN B 12) 500 MCG TABS Take 1,000 mcg by mouth daily.  Marland Kitchen linaclotide (LINZESS) 72 MCG capsule Take 1 capsule (72 mcg total) by mouth daily.  Marland Kitchen LORazepam (ATIVAN) 0.5 MG tablet TAKE 1 TABLET(0.5 MG) BY MOUTH DAILY AS NEEDED FOR ANXIETY  . lubiprostone (AMITIZA) 24 MCG capsule Take 1 capsule (24 mcg total) by mouth daily with breakfast.  . meloxicam (MOBIC) 15 MG tablet Take 1 tablet (15 mg total) by mouth daily.  . metoprolol tartrate (LOPRESSOR) 50 MG tablet Take 1 tablet (50 mg total) by mouth daily.  . Multiple Vitamins-Minerals (MULTIVITAMINS THER. W/MINERALS) TABS Take 1 tablet by mouth daily.  Marland Kitchen venlafaxine XR (EFFEXOR-XR) 150 MG 24 hr capsule Take 1 capsule (150 mg total) by mouth daily with breakfast.   No facility-administered encounter medications on file as of 07/08/2019.    Activities of Daily Living In your present state of health, do you have any difficulty  performing the following activities: 07/08/2019  Hearing? N  Comment no hearing aids  Vision? N  Comment Dr.Woodard, eyeglasses  Difficulty concentrating or making decisions? Y  Comment no more than the last couple years. losing keys quit frequently, husband took over finances.  Walking or climbing stairs? Y  Dressing or bathing? N  Doing errands, shopping? N  Preparing Food and eating ? N  Using the Toilet? N  In the past six months, have you accidently leaked urine? N  Do you have problems with loss of bowel control? N  Managing your Medications? N  Managing your Finances? N  Housekeeping or managing your Housekeeping? N  Some recent data might be hidden    Patient Care Team: Guadalupe Maple, MD as PCP - General (Unknown Physician Specialty)    Assessment:   This is a routine wellness examination for Jadia.  Exercise Activities and Dietary recommendations Current Exercise Habits: Home exercise routine, Type of exercise: walking, Time (Minutes): 60(1-2 miles), Frequency (Times/Week): 5, Weekly Exercise (Minutes/Week): 300, Intensity: Mild, Exercise limited by: None identified  Goals   None     Fall Risk: Fall Risk  07/08/2019 03/18/2018 01/23/2018 01/17/2017 07/30/2016  Falls in the past year? 1 Yes Yes Yes Yes  Number falls in past yr: 0 2 or more 1 1 1   Comment November, passed out in the gym, fell off eliptical. - - - -  Injury with Fall? 0 No No No No  Comment just some bruising - - - -  Risk for fall due to : - - - - History of fall(s)    FALL RISK PREVENTION PERTAINING TO THE HOME:  Any stairs in or around the home? No  If so, are there any without handrails? Yes   Home free of loose throw rugs in walkways, pet beds, electrical cords, etc? Yes  Adequate lighting in your home to reduce risk of falls? Yes   ASSISTIVE DEVICES UTILIZED TO PREVENT FALLS:  Life alert? No  Use of a cane, walker or w/c? No  Grab bars in the bathroom? No  Shower chair or bench in  shower? No  Elevated toilet seat or a handicapped toilet? No   DME ORDERS:  DME order needed?  No   TIMED UP AND GO:  Unable to perform    Depression Screen PHQ 2/9 Scores 07/08/2019 03/18/2018 01/23/2018 02/27/2017  PHQ - 2 Score 0 0 1 0  PHQ- 9 Score - 3 - 7     Cognitive Function     6CIT Screen 01/23/2018 01/17/2017  What Year? 0 points 0 points  What month? 0 points 0 points  What time? 0 points 0 points  Count back from 20 0 points 0 points  Months in reverse 0 points 0 points  Repeat phrase 0 points 2 points  Total Score 0 2    Immunization History  Administered Date(s) Administered  . Influenza,inj,Quad PF,6+ Mos 02/27/2017, 03/18/2018, 03/19/2019  . Tdap 08/02/2011    Qualifies for Shingles Vaccine? No  Zostavax completed n/a. Due for Shingrix. Education has been provided regarding the importance of this vaccine. Pt has been advised to call insurance company to determine out of pocket expense. Advised may also receive vaccine at local pharmacy or Health Dept. Verbalized acceptance and understanding.  Tdap: up to date   Flu Vaccine: up to date  Pneumococcal Vaccine: not indicated   Screening Tests Health Maintenance  Topic Date Due  . MAMMOGRAM  07/29/2019  . PAP SMEAR-Modifier  02/28/2020  . TETANUS/TDAP  08/01/2021  . INFLUENZA VACCINE  Completed  . HIV Screening  Completed    Cancer Screenings:  Colorectal Screening: Completed about 5 years ago, due now. Wants to wait due to covid19 precautions.  Wants to discuss GI appt with Dr.Johnson.   Mammogram: Completed 07/28/2018. Repeat every year.   Bone Density: not indicated   Lung Cancer Screening: (Low Dose CT Chest recommended if Age 87-80 years, 30 pack-year currently smoking OR have quit w/in 15years.) does not qualify.     Additional Screening:  Hepatitis C Screening: does not qualify  Vision Screening: Recommended annual ophthalmology exams for early detection of  glaucoma and other  disorders of the eye. Is the patient up to date with their annual eye exam?  Yes  Who is the provider or what is the name of the office in which the pt attends annual eye exams? [facility]    Dental Screening: Recommended annual dental exams for proper oral hygiene  Community Resource Referral:  CRR required this visit?  Yes, is having hard time finding life Set designer. Would like assistance.      Plan:  I have personally reviewed and addressed the Medicare Annual Wellness questionnaire and have noted the following in the patient's chart:  A. Medical and social history B. Use of alcohol, tobacco or illicit drugs  C. Current medications and supplements D. Functional ability and status E.  Nutritional status F.  Physical activity G. Advance directives H. List of other physicians I.  Hospitalizations, surgeries, and ER visits in previous 12 months J.  [place] such as hearing and vision if needed, cognitive and depression L. Referrals and appointments   In addition, I have reviewed and discussed with patient certain preventive protocols, quality metrics, and best practice recommendations. A written personalized care plan for preventive services as well as general preventive health recommendations were provided to patient.  Signed,    Bevelyn Ngo, LPN  [id number] Nurse Health Advisor   Nurse Notes: has been walking daily and hurt her left heel. Stabbing pressure when pressure on it and dull contstant ache when resting.

## 2019-07-08 NOTE — Patient Instructions (Signed)
Angela [name] , Thank you for taking time to come for your Medicare Wellness Visit. I appreciate your ongoing commitment to your health goals. Please review the following plan we discussed and let me know if I can assist you in the future.   Screening recommendations/referrals: Colonoscopy: due now, declined currently.  Mammogram: Please call [phone] to schedule your mammogram.  Bone Density: not indicated  Recommended yearly ophthalmology/optometry visit for glaucoma screening and checkup Recommended yearly dental visit for hygiene and checkup  Vaccinations: Influenza vaccine: up to date  Pneumococcal vaccine: not indicated  Tdap vaccine: up to date  Shingles vaccine: not indicated     Advanced directives: Please bring a copy of your health care power of attorney and living will to the office at your convenience.  Conditions/risks identified: Someone will be reaching out to you to discuss resources we talked about during todays visit.   Next appointment: Follow up in one year for your annual wellness visit   Preventive Care 40-64 Years, Female Preventive care refers to lifestyle choices and visits with your health care provider that can promote health and wellness. What does preventive care include?  A yearly physical exam. This is also called an annual well check.  Dental exams once or twice a year.  Routine eye exams. Ask your health care provider how often you should have your eyes checked.  Personal lifestyle choices, including:  Daily care of your teeth and gums.  Regular physical activity.  Eating a healthy diet.  Avoiding tobacco and drug use.  Limiting alcohol use.  Practicing safe sex.  Taking low-dose aspirin daily starting at age 23.  Taking vitamin and mineral supplements as recommended by your health care provider. What happens during an annual well check? The services and screenings done by your health care provider during your annual well check  will depend on your age, overall health, lifestyle risk factors, and family history of disease. Counseling  Your health care provider may ask you questions about your:  Alcohol use.  Tobacco use.  Drug use.  Emotional well-being.  Home and relationship well-being.  Sexual activity.  Eating habits.  Work and work Statistician.  Method of birth control.  Menstrual cycle.  Pregnancy history. Screening  You may have the following tests or measurements:  Height, weight, and BMI.  Blood pressure.  Lipid and cholesterol levels. These may be checked every 5 years, or more frequently if you are over 63 years old.  Skin check.  Lung cancer screening. You may have this screening every year starting at age 20 if you have a 30-pack-year history of smoking and currently smoke or have quit within the past 15 years.  Fecal occult blood test (FOBT) of the stool. You may have this test every year starting at age 50.  Flexible sigmoidoscopy or colonoscopy. You may have a sigmoidoscopy every 5 years or a colonoscopy every 10 years starting at age 27.  Hepatitis C blood test.  Hepatitis B blood test.  Sexually transmitted disease (STD) testing.  Diabetes screening. This is done by checking your blood sugar (glucose) after you have not eaten for a while (fasting). You may have this done every 1-3 years.  Mammogram. This may be done every 1-2 years. Talk to your health care provider about when you should start having regular mammograms. This may depend on whether you have a family history of breast cancer.  BRCA-related cancer screening. This may be done if you have a family history of breast,  ovarian, tubal, or peritoneal cancers.  Pelvic exam and Pap test. This may be done every 3 years starting at age 93. Starting at age 68, this may be done every 5 years if you have a Pap test in combination with an HPV test.  Bone density scan. This is done to screen for osteoporosis. You may  have this scan if you are at high risk for osteoporosis. Discuss your test results, treatment options, and if necessary, the need for more tests with your health care provider. Vaccines  Your health care provider may recommend certain vaccines, such as:  Influenza vaccine. This is recommended every year.  Tetanus, diphtheria, and acellular pertussis (Tdap, Td) vaccine. You may need a Td booster every 10 years.  Zoster vaccine. You may need this after age 28.  Pneumococcal 13-valent conjugate (PCV13) vaccine. You may need this if you have certain conditions and were not previously vaccinated.  Pneumococcal polysaccharide (PPSV23) vaccine. You may need one or two doses if you smoke cigarettes or if you have certain conditions. Talk to your health care provider about which screenings and vaccines you need and how often you need them. This information is not intended to replace advice given to you by your health care provider. Make sure you discuss any questions you have with your health care provider. Document Released: 06/16/2015 Document Revised: 02/07/2016 Document Reviewed: 03/21/2015 Elsevier Interactive Patient Education  2017 [place] Prevention in the Home Falls can cause injuries. They can happen to people of all ages. There are many things you can do to make your home safe and to help prevent falls. What can I do on the outside of my home?  Regularly fix the edges of walkways and driveways and fix any cracks.  Remove anything that might make you trip as you walk through a door, such as a raised step or threshold.  Trim any bushes or trees on the path to your home.  Use bright outdoor lighting.  Clear any walking paths of anything that might make someone trip, such as rocks or tools.  Regularly check to see if handrails are loose or broken. Make sure that both sides of any steps have handrails.  Any raised decks and porches should have guardrails on the  edges.  Have any leaves, snow, or ice cleared regularly.  Use sand or salt on walking paths during winter.  Clean up any spills in your garage right away. This includes oil or grease spills. What can I do in the bathroom?  Use night lights.  Install grab bars by the toilet and in the tub and shower. Do not use towel bars as grab bars.  Use non-skid mats or decals in the tub or shower.  If you need to sit down in the shower, use a plastic, non-slip stool.  Keep the floor dry. Clean up any water that spills on the floor as soon as it happens.  Remove soap buildup in the tub or shower regularly.  Attach bath mats securely with double-sided non-slip rug tape.  Do not have throw rugs and other things on the floor that can make you trip. What can I do in the bedroom?  Use night lights.  Make sure that you have a light by your bed that is easy to reach.  Do not use any sheets or blankets that are too big for your bed. They should not hang down onto the floor.  Have a firm chair that has side  arms. You can use this for support while you get dressed.  Do not have throw rugs and other things on the floor that can make you trip. What can I do in the kitchen?  Clean up any spills right away.  Avoid walking on wet floors.  Keep items that you use a lot in easy-to-reach places.  If you need to reach something above you, use a strong step stool that has a grab bar.  Keep electrical cords out of the way.  Do not use floor polish or wax that makes floors slippery. If you must use wax, use non-skid floor wax.  Do not have throw rugs and other things on the floor that can make you trip. What can I do with my stairs?  Do not leave any items on the stairs.  Make sure that there are handrails on both sides of the stairs and use them. Fix handrails that are broken or loose. Make sure that handrails are as long as the stairways.  Check any carpeting to make sure that it is firmly  attached to the stairs. Fix any carpet that is loose or worn.  Avoid having throw rugs at the top or bottom of the stairs. If you do have throw rugs, attach them to the floor with carpet tape.  Make sure that you have a light switch at the top of the stairs and the bottom of the stairs. If you do not have them, ask someone to add them for you. What else can I do to help prevent falls?  Wear shoes that:  Do not have high heels.  Have rubber bottoms.  Are comfortable and fit you well.  Are closed at the toe. Do not wear sandals.  If you use a stepladder:  Make sure that it is fully opened. Do not climb a closed stepladder.  Make sure that both sides of the stepladder are locked into place.  Ask someone to hold it for you, if possible.  Clearly mark and make sure that you can see:  Any grab bars or handrails.  First and last steps.  Where the edge of each step is.  Use tools that help you move around (mobility aids) if they are needed. These include:  Canes.  Walkers.  Scooters.  Crutches.  Turn on the lights when you go into a dark area. Replace any light bulbs as soon as they burn out.  Set up your furniture so you have a clear path. Avoid moving your furniture around.  If any of your floors are uneven, fix them.  If there are any pets around you, be aware of where they are.  Review your medicines with your doctor. Some medicines can make you feel dizzy. This can increase your chance of falling. Ask your doctor what other things that you can do to help prevent falls. This information is not intended to replace advice given to you by your health care provider. Make sure you discuss any questions you have with your health care provider. Document Released: 03/16/2009 Document Revised: 10/26/2015 Document Reviewed: 06/24/2014 Elsevier Interactive Patient Education  2017 [employer].

## 2019-07-09 ENCOUNTER — Ambulatory Visit (INDEPENDENT_AMBULATORY_CARE_PROVIDER_SITE_OTHER): Payer: Medicare PPO | Admitting: Family Medicine

## 2019-07-09 ENCOUNTER — Encounter: Payer: Self-pay | Admitting: Family Medicine

## 2019-07-09 ENCOUNTER — Other Ambulatory Visit: Payer: Self-pay

## 2019-07-09 VITALS — BP 114/76 | HR 76 | Temp 98.5°F | Ht 64.37 in | Wt 160.0 lb

## 2019-07-09 DIAGNOSIS — I4891 Unspecified atrial fibrillation: Secondary | ICD-10-CM | POA: Diagnosis not present

## 2019-07-09 DIAGNOSIS — K5909 Other constipation: Secondary | ICD-10-CM

## 2019-07-09 DIAGNOSIS — I442 Atrioventricular block, complete: Secondary | ICD-10-CM

## 2019-07-09 DIAGNOSIS — M722 Plantar fascial fibromatosis: Secondary | ICD-10-CM | POA: Diagnosis not present

## 2019-07-09 DIAGNOSIS — Z0000 Encounter for general adult medical examination without abnormal findings: Secondary | ICD-10-CM

## 2019-07-09 DIAGNOSIS — F339 Major depressive disorder, recurrent, unspecified: Secondary | ICD-10-CM

## 2019-07-09 DIAGNOSIS — I471 Supraventricular tachycardia: Secondary | ICD-10-CM

## 2019-07-09 DIAGNOSIS — I4719 Other supraventricular tachycardia: Secondary | ICD-10-CM

## 2019-07-09 NOTE — Progress Notes (Signed)
BP 114/76 (BP Location: Left Arm, Patient Position: Sitting, Cuff Size: Normal)   Pulse 76   Temp 98.5 F (36.9 C) (Oral)   Ht 5' 4.37" (1.635 m)   Wt 160 lb (72.6 kg)   SpO2 100%   BMI 27.15 kg/m    Subjective:    Patient ID: Angela [name], female    DOB: [birth year], 49 y.o.   MRN: [id number]  HPI: Angela [name] is a 49 y.o. female presenting on 07/09/2019 for comprehensive medical examination. Current medical complaints include:  Constipation- takes the amitiza every day and takes the linzess if she goes more than 2-3 days to go to the bathroom. This has worked pretty well for her for a while, but she notes that it has gotten a bit worse and she's feeling more uncomfortable. She doesn't feel like it's working as well. She thinks she is due for another colonoscopy and would like to go to GI.  DEPRESSION- had a rough patch right after the holidays, but doing better, taking her lorazepam PRN- very rarely Mood status: controlled Satisfied with current treatment?: yes Symptom severity: moderate  Duration of current treatment : chronic Side effects: no Medication compliance: excellent compliance Psychotherapy/counseling: no  Previous psychiatric medications: effexor, lorazepam Depressed mood: yes Anxious mood: yes Anhedonia: no Significant weight loss or gain: no Insomnia: no  Fatigue: yes Feelings of worthlessness or guilt: no Impaired concentration/indecisiveness: yes Suicidal ideations: no Hopelessness: no Crying spells: no Depression screen [facility] 2/9 07/09/2019 07/08/2019 03/18/2018 01/23/2018 02/27/2017  Decreased Interest 1 0 0 1 0  Down, Depressed, Hopeless 1 0 0 0 0  PHQ - 2 Score 2 0 0 1 0  Altered sleeping 2 - 1 - 3  Tired, decreased energy 1 - 1 - 2  Change in appetite 1 - 0 - 0  Feeling bad or failure about yourself  0 - 0 - 0  Trouble concentrating 1 - 1 - 2  Moving slowly or fidgety/restless 1 - 0 - 0  Suicidal thoughts 0 - 0 - 0  PHQ-9 Score 8 - 3 - 7   Difficult doing work/chores Somewhat difficult - - - -    Pain in her L heel x a few weeks. Has been walking a lot, which she really enjoys, but notes that its been hurting a lot. She got new shoes, insoles, has been stretching, icing, but nothing is making it better. No other concerns or complaints at this time.   She currently lives with: husband (kids- in school) Menopausal Symptoms: yes  Functional Status Survey: Is the patient deaf or have difficulty hearing?: No Does the patient have difficulty seeing, even when wearing glasses/contacts?: No Does the patient have difficulty concentrating, remembering, or making decisions?: Yes Does the patient have difficulty walking or climbing stairs?: No Does the patient have difficulty dressing or bathing?: No Does the patient have difficulty doing errands alone such as visiting a doctor's office or shopping?: Yes  Fall Risk  07/08/2019 03/18/2018 01/23/2018 01/17/2017 07/30/2016  Falls in the past year? 1 Yes Yes Yes Yes  Number falls in past yr: 0 2 or more 1 1 1   Comment November, passed out in the gym, fell off eliptical. - - - -  Injury with Fall? 0 No No No No  Comment just some bruising - - - -  Risk for fall due to : - - - - History of fall(s)    Depression Screen Depression screen [facility] 2/9 07/09/2019 07/08/2019 03/18/2018  01/23/2018 02/27/2017  Decreased Interest 1 0 0 1 0  Down, Depressed, Hopeless 1 0 0 0 0  PHQ - 2 Score 2 0 0 1 0  Altered sleeping 2 - 1 - 3  Tired, decreased energy 1 - 1 - 2  Change in appetite 1 - 0 - 0  Feeling bad or failure about yourself  0 - 0 - 0  Trouble concentrating 1 - 1 - 2  Moving slowly or fidgety/restless 1 - 0 - 0  Suicidal thoughts 0 - 0 - 0  PHQ-9 Score 8 - 3 - 7  Difficult doing work/chores Somewhat difficult - - - -   Advanced Directives Does patient have a HCPOA?    yes Does patient have a living will or MOST form?  yes  Past Medical History:  Past Medical History:  Diagnosis Date  .  Chest pain    a cath 2/10. EF 50-55% normal coronaries  . Complete heart block ([place])   . Depression   . Dysautonomia ([place])   . Dyspnea    CT negative for PE, 2007  . Hypertension   . Insomnia   . OCD (obsessive compulsive disorder)   . SVT (supraventricular tachycardia) (HCC)    s/p ablation a. c/b AV nod ablation requiring pacemaker    Surgical History:  Past Surgical History:  Procedure Laterality Date  . BREAST BIOPSY Right 1998  . BREAST BIOPSY Right 2004  . BREAST LUMPECTOMY     RIGHT -BREAST   . ENDOMETRIAL ABLATION  2005  . PELVIC LAPAROSCOPY  1990  . PERMANENT PACEMAKER GENERATOR CHANGE N/A 07/13/2014   MDT MRI compatible dual chamber pacemaker implanted by Dr Caryl Comes  . SPARK  2005  . TUBAL LIGATION  2003    Medications:  Current Outpatient Medications on File Prior to Visit  Medication Sig  . Cyanocobalamin (VITAMIN B 12) 500 MCG TABS Take 1,000 mcg by mouth daily.  Marland Kitchen linaclotide (LINZESS) 72 MCG capsule Take 1 capsule (72 mcg total) by mouth daily.  Marland Kitchen LORazepam (ATIVAN) 0.5 MG tablet TAKE 1 TABLET(0.5 MG) BY MOUTH DAILY AS NEEDED FOR ANXIETY  . lubiprostone (AMITIZA) 24 MCG capsule Take 1 capsule (24 mcg total) by mouth daily with breakfast.  . meloxicam (MOBIC) 15 MG tablet Take 1 tablet (15 mg total) by mouth daily.  . metoprolol tartrate (LOPRESSOR) 50 MG tablet Take 1 tablet (50 mg total) by mouth daily.  . Multiple Vitamins-Minerals (MULTIVITAMINS THER. W/MINERALS) TABS Take 1 tablet by mouth daily.  Marland Kitchen venlafaxine XR (EFFEXOR-XR) 150 MG 24 hr capsule Take 1 capsule (150 mg total) by mouth daily with breakfast.   No current facility-administered medications on file prior to visit.    Allergies:  Allergies  Allergen Reactions  . Toprol Xl [Metoprolol Succinate] Photosensitivity    Social History:  Social History   Socioeconomic History  . Marital status: Married    Spouse name: Not on file  . Number of children: Not on file  . Years of education:  Not on file  . Highest education level: Bachelor's degree (e.g., BA, AB, BS)  Occupational History  . Occupation: disability   Tobacco Use  . Smoking status: Never Smoker  . Smokeless tobacco: Never Used  Substance and Sexual Activity  . Alcohol use: No  . Drug use: No  . Sexual activity: Yes    Birth control/protection: Surgical  Other Topics Concern  . Not on file  Social History Narrative   Work with daughters school  Social Determinants of Health   Financial Resource Strain:   . Difficulty of Paying Living Expenses: Not on file  Food Insecurity:   . Worried About Charity fundraiser in the Last Year: Not on file  . Ran Out of Food in the Last Year: Not on file  Transportation Needs:   . Lack of Transportation (Medical): Not on file  . Lack of Transportation (Non-Medical): Not on file  Physical Activity:   . Days of Exercise per Week: Not on file  . Minutes of Exercise per Session: Not on file  Stress:   . Feeling of Stress : Not on file  Social Connections:   . Frequency of Communication with Friends and Family: Not on file  . Frequency of Social Gatherings with Friends and Family: Not on file  . Attends Religious Services: Not on file  . Active Member of Clubs or Organizations: Not on file  . Attends Archivist Meetings: Not on file  . Marital Status: Not on file  Intimate Partner Violence:   . Fear of Current or Ex-Partner: Not on file  . Emotionally Abused: Not on file  . Physically Abused: Not on file  . Sexually Abused: Not on file   Social History   Tobacco Use  Smoking Status Never Smoker  Smokeless Tobacco Never Used   Social History   Substance and Sexual Activity  Alcohol Use No    Family History:  Family History  Problem Relation Age of Onset  . Hypertension Father   . Cancer Father 45       COLON  . Diabetes Father   . Breast cancer Paternal Aunt   . Hypertension Maternal Grandfather   . Diabetes Paternal Grandmother   .  Breast cancer Paternal Grandmother   . Diabetes Paternal Grandfather   . Breast cancer Maternal Aunt        mastectomy  . Dementia Maternal Grandmother   . Breast cancer Paternal Aunt     Past medical history, surgical history, medications, allergies, family history and social history reviewed with patient today and changes made to appropriate areas of the chart.   Review of Systems  Constitutional: Positive for diaphoresis. Negative for chills, fever, malaise/fatigue and weight loss.  HENT: Negative.   Eyes: Negative.   Respiratory: Negative.   Cardiovascular: Positive for palpitations. Negative for chest pain, orthopnea, claudication, leg swelling and PND.  Gastrointestinal: Positive for abdominal pain and constipation. Negative for blood in stool, diarrhea, heartburn, melena, nausea and vomiting.  Genitourinary: Negative.   Musculoskeletal: Negative.   Skin: Negative.   Neurological: Positive for dizziness. Negative for tingling, tremors, sensory change, speech change, focal weakness, seizures, loss of consciousness, weakness and headaches.  Endo/Heme/Allergies: Negative for environmental allergies and polydipsia. Bruises/bleeds easily (no significant changes).  Psychiatric/Behavioral: Positive for depression. Negative for hallucinations, memory loss, substance abuse and suicidal ideas. The patient is nervous/anxious. The patient does not have insomnia.     All other ROS negative except what is listed above and in the HPI.      Objective:    BP 114/76 (BP Location: Left Arm, Patient Position: Sitting, Cuff Size: Normal)   Pulse 76   Temp 98.5 F (36.9 C) (Oral)   Ht 5' 4.37" (1.635 m)   Wt 160 lb (72.6 kg)   SpO2 100%   BMI 27.15 kg/m   Wt Readings from Last 3 Encounters:  07/09/19 160 lb (72.6 kg)  07/08/19 158 lb (71.7 kg)  03/19/19 156 lb (  70.8 kg)    Physical Exam Vitals and nursing note reviewed. Exam conducted with a chaperone present.  Constitutional:       General: She is not in acute distress.    Appearance: Normal appearance. She is not ill-appearing, toxic-appearing or diaphoretic.  HENT:     Head: Normocephalic and atraumatic.     Right Ear: Tympanic membrane, ear canal and external ear normal. There is no impacted cerumen.     Left Ear: Tympanic membrane, ear canal and external ear normal. There is no impacted cerumen.     Nose: Nose normal. No congestion or rhinorrhea.     Mouth/Throat:     Mouth: Mucous membranes are moist.     Pharynx: Oropharynx is clear. No oropharyngeal exudate or posterior oropharyngeal erythema.  Eyes:     General: No scleral icterus.       Right eye: No discharge.        Left eye: No discharge.     Extraocular Movements: Extraocular movements intact.     Conjunctiva/sclera: Conjunctivae normal.     Pupils: Pupils are equal, round, and reactive to light.  Neck:     Vascular: No carotid bruit.  Cardiovascular:     Rate and Rhythm: Normal rate and regular rhythm.     Pulses: Normal pulses.     Heart sounds: No murmur. No friction rub. No gallop.   Pulmonary:     Effort: Pulmonary effort is normal. No respiratory distress.     Breath sounds: Normal breath sounds. No stridor. No wheezing, rhonchi or rales.  Chest:     Chest wall: No tenderness.     Breasts:        Right: Normal. No swelling, bleeding, inverted nipple, mass, nipple discharge, skin change or tenderness.        Left: Normal. No swelling, bleeding, inverted nipple, mass, nipple discharge, skin change or tenderness.  Abdominal:     General: Abdomen is flat. Bowel sounds are normal. There is no distension.     Palpations: Abdomen is soft. There is no mass.     Tenderness: There is no abdominal tenderness. There is no right CVA tenderness, left CVA tenderness, guarding or rebound.     Hernia: No hernia is present.  Genitourinary:    Comments: Pelvic exams deferred with shared decision making Musculoskeletal:        General: No swelling,  tenderness, deformity or signs of injury.     Cervical back: Normal range of motion and neck supple. No rigidity. No muscular tenderness.     Right lower leg: No edema.     Left lower leg: No edema.  Lymphadenopathy:     Cervical: No cervical adenopathy.  Skin:    General: Skin is warm and dry.     Capillary Refill: Capillary refill takes less than 2 seconds.     Coloration: Skin is not jaundiced or pale.     Findings: No bruising, erythema, lesion or rash.  Neurological:     General: No focal deficit present.     Mental Status: She is alert and oriented to person, place, and time. Mental status is at baseline.     Cranial Nerves: No cranial nerve deficit.     Sensory: No sensory deficit.     Motor: No weakness.     Coordination: Coordination normal.     Gait: Gait normal.     Deep Tendon Reflexes: Reflexes normal.  Psychiatric:        Mood  and Affect: Mood normal.        Behavior: Behavior normal.        Thought Content: Thought content normal.        Judgment: Judgment normal.     6CIT Screen 07/09/2019 01/23/2018 01/17/2017  What Year? 0 points 0 points 0 points  What month? 0 points 0 points 0 points  What time? 0 points 0 points 0 points  Count back from 20 0 points 0 points 0 points  Months in reverse 0 points 0 points 0 points  Repeat phrase 2 points 0 points 2 points  Total Score 2 0 2    Results for orders placed or performed in visit on 05/12/19  CUP PACEART REMOTE DEVICE CHECK  Result Value Ref Range   Date Time Interrogation Session [id number]    Pulse Generator Manufacturer MERM    Pulse Gen Model [id number] Advisa DR MRI    Pulse Gen Serial Number [id number]    Clinic Name [facility]    Implantable Pulse Generator Type Implantable Pulse Generator    Implantable Pulse Generator Implant Date [id number]    Implantable Lead Manufacturer MERM    Implantable Lead Model 5076 CapSureFix Novus    Implantable Lead Serial Number [id number]    Implantable Lead  Implant Date [id number]    Implantable Lead Location Detail 1 APPENDAGE    Implantable Lead Location [id number]    Implantable Lead Manufacturer MERM    Implantable Lead Model 5076 CapSureFix Novus    Implantable Lead Serial Number [id number]    Implantable Lead Implant Date [id number]    Implantable Lead Location Detail 1 APEX    Implantable Lead Location [id number]    Lead Channel Setting Sensing Sensitivity 2 mV   Lead Channel Setting Pacing Amplitude 2 V   Lead Channel Setting Pacing Pulse Width 0.4 ms   Lead Channel Setting Pacing Amplitude 2.5 V   Lead Channel Impedance Value 399 ohm   Lead Channel Impedance Value 285 ohm   Lead Channel Sensing Intrinsic Amplitude 4 mV   Lead Channel Sensing Intrinsic Amplitude 4 mV   Lead Channel Pacing Threshold Amplitude 0.75 V   Lead Channel Pacing Threshold Pulse Width 0.4 ms   Lead Channel Impedance Value 475 ohm   Lead Channel Impedance Value 418 ohm   Lead Channel Sensing Intrinsic Amplitude 4.875 mV   Lead Channel Sensing Intrinsic Amplitude 4.875 mV   Lead Channel Pacing Threshold Amplitude 0.75 V   Lead Channel Pacing Threshold Pulse Width 0.4 ms   Battery Status OK    Battery Remaining Longevity 53 mo   Battery Voltage 2.99 V   Brady Statistic RA Percent Paced 12.74 %   Brady Statistic RV Percent Paced 99.87 %   Brady Statistic AP VP Percent 12.7 %   Brady Statistic AS VP Percent 87.21 %   Brady Statistic AP VS Percent 0.06 %   Brady Statistic AS VS Percent 0.02 %      Assessment & Plan:   Problem List Items Addressed This Visit      Cardiovascular and Mediastinum   AV BLOCK, COMPLETE    HR under good control. Continue to follow with cardiology. Call with any concerns. Continue to monitor.      Atrial tachycardia/SINUS tachycardia    HR under good control. Continue to follow with cardiology. Call with any concerns. Continue to monitor.      Atrial fibrillation (HCC)    HR under good control. Continue to follow with cardiology.  Call with any concerns. Continue to monitor.        Digestive   Chronic constipation    Likely due to her dysautonomia. Will get her into GI for evaluation and ?repeat colonoscopy. Referral generated today.      Relevant Orders   Ambulatory referral to Gastroenterology     Other   Depression, recurrent ([place])    Under good control on current regimen. Continue current regimen. Continue to monitor. Call with any concerns. Refills given last visit. Continue to monitor.         Other Visit Diagnoses    Wellness examination    -  Primary   Preventative care discussed today as below. Call with any concerns.    Routine general medical examination at a health care facility       Vaccines up to date. Screening labs checked in October and normal. Pap up to date. Mammo and colonoscopy ordered. Call with any concerns.    Plantar fasciitis, left       Stretches given. Will get her into podiatry. Call with any concerns.    Relevant Orders   Ambulatory referral to Podiatry       Preventative Services:  Health Risk Assessment and Personalized Prevention Plan: Done today Bone Mass Measurements: N/A Breast Cancer Screening: Order in CVD Screening: Up to date Cervical Cancer Screening: up to date Colon Cancer Screening: Up to date Depression Screening: Done today Diabetes Screening: up to date Glaucoma Screening: See your eye doctor Hepatitis B vaccine: N/A Hepatitis C screening:  N/A HIV Screening: up to date Flu Vaccine: up to date Lung cancer Screening: n/A Obesity Screening: Done today Pneumonia Vaccines (2): N/A STI Screening: N/A  Follow up plan: Return in about 6 months (around 01/06/2020).   LABORATORY TESTING:  - Pap smear: up to date  IMMUNIZATIONS:   - Tdap: Tetanus vaccination status reviewed: last tetanus booster within 10 years. - Influenza: Up to date  SCREENING: -Mammogram: Up to date  - Colonoscopy: Ordered today   PATIENT COUNSELING:   Advised to take 1  mg of folate supplement per day if capable of pregnancy.   Sexuality: Discussed sexually transmitted diseases, partner selection, use of condoms, avoidance of unintended pregnancy  and contraceptive alternatives.   Advised to avoid cigarette smoking.  I discussed with the patient that most people either abstain from alcohol or drink within safe limits (<=14/week and <=4 drinks/occasion for males, <=7/weeks and <= 3 drinks/occasion for females) and that the risk for alcohol disorders and other health effects rises proportionally with the number of drinks per week and how often a drinker exceeds daily limits.  Discussed cessation/primary prevention of drug use and availability of treatment for abuse.   Diet: Encouraged to adjust caloric intake to maintain  or achieve ideal body weight, to reduce intake of dietary saturated fat and total fat, to limit sodium intake by avoiding high sodium foods and not adding table salt, and to maintain adequate dietary potassium and calcium preferably from fresh fruits, vegetables, and low-fat dairy products.    stressed the importance of regular exercise  Injury prevention: Discussed safety belts, safety helmets, smoke detector, smoking near bedding or upholstery.   Dental health: Discussed importance of regular tooth brushing, flossing, and dental visits.    NEXT PREVENTATIVE PHYSICAL DUE IN 1 YEAR. Return in about 6 months (around 01/06/2020).

## 2019-07-09 NOTE — Assessment & Plan Note (Signed)
Under good control on current regimen. Continue current regimen. Continue to monitor. Call with any concerns. Refills given last visit. Continue to monitor.

## 2019-07-09 NOTE — Assessment & Plan Note (Signed)
Likely due to her dysautonomia. Will get her into GI for evaluation and ?repeat colonoscopy. Referral generated today.

## 2019-07-09 NOTE — Patient Instructions (Addendum)
Preventative Services:  Health Risk Assessment and Personalized Prevention Plan: Done today Bone Mass Measurements: N/A Breast Cancer Screening: Order in CVD Screening: Up to date Cervical Cancer Screening: up to date Colon Cancer Screening: Up to date Depression Screening: Done today Diabetes Screening: up to date Glaucoma Screening: See your eye doctor Hepatitis B vaccine: N/A Hepatitis C screening:  N/A HIV Screening: up to date Flu Vaccine: up to date Lung cancer Screening: n/A Obesity Screening: Done today Pneumonia Vaccines (2): N/A STI Screening: N/A  Plantar Fasciitis Rehab Ask your health care provider which exercises are safe for you. Do exercises exactly as told by your health care provider and adjust them as directed. It is normal to feel mild stretching, pulling, tightness, or discomfort as you do these exercises. Stop right away if you feel sudden pain or your pain gets worse. Do not begin these exercises until told by your health care provider. Stretching and range-of-motion exercises These exercises warm up your muscles and joints and improve the movement and flexibility of your foot. These exercises also help to relieve pain. Plantar fascia stretch  1. Sit with your left / right leg crossed over your opposite knee. 2. Hold your heel with one hand with that thumb near your arch. With your other hand, hold your toes and gently pull them back toward the top of your foot. You should feel a stretch on the bottom of your toes or your foot (plantar fascia) or both. 3. Hold this stretch for__________ seconds. 4. Slowly release your toes and return to the starting position. Repeat __________ times. Complete this exercise __________ times a day. Gastrocnemius stretch, standing This exercise is also called a calf (gastroc) stretch. It stretches the muscles in the back of the upper calf. 1. Stand with your hands against a wall. 2. Extend your left / right leg behind you, and  bend your front knee slightly. 3. Keeping your heels on the floor and your back knee straight, shift your weight toward the wall. Do not arch your back. You should feel a gentle stretch in your upper left / right calf. 4. Hold this position for __________ seconds. Repeat __________ times. Complete this exercise __________ times a day. Soleus stretch, standing This exercise is also called a calf (soleus) stretch. It stretches the muscles in the back of the lower calf. 1. Stand with your hands against a wall. 2. Extend your left / right leg behind you, and bend your front knee slightly. 3. Keeping your heels on the floor, bend your back knee and shift your weight slightly over your back leg. You should feel a gentle stretch deep in your lower calf. 4. Hold this position for __________ seconds. Repeat __________ times. Complete this exercise __________ times a day. Gastroc and soleus stretch, standing step This exercise stretches the muscles in the back of the lower leg. These muscles are in the upper calf (gastrocnemius) and the lower calf (soleus). 1. Stand with the ball of your left / right foot on a step. The ball of your foot is on the walking surface, right under your toes. 2. Keep your other foot firmly on the same step. 3. Hold on to the wall or a railing for balance. 4. Slowly lift your other foot, allowing your body weight to press your left / right heel down over the edge of the step. You should feel a stretch in your left / right calf. 5. Hold this position for __________ seconds. 6. Return both feet to  the step. 7. Repeat this exercise with a slight bend in your left / right knee. Repeat __________ times with your left / right knee straight and __________ times with your left / right knee bent. Complete this exercise __________ times a day. Balance exercise This exercise builds your balance and strength control of your arch to help take pressure off your plantar fascia. Single leg  stand If this exercise is too easy, you can try it with your eyes closed or while standing on a pillow. 1. Without shoes, stand near a railing or in a doorway. You may hold on to the railing or door frame as needed. 2. Stand on your left / right foot. Keep your big toe down on the floor and try to keep your arch lifted. Do not let your foot roll inward. 3. Hold this position for __________ seconds. Repeat __________ times. Complete this exercise __________ times a day. This information is not intended to replace advice given to you by your health care provider. Make sure you discuss any questions you have with your health care provider. Document Revised: 09/10/2018 Document Reviewed: 03/18/2018 Elsevier Patient Education  [place] Maintenance, Female Adopting a healthy lifestyle and getting preventive care are important in promoting health and wellness. Ask your health care provider about:  The right schedule for you to have regular tests and exams.  Things you can do on your own to prevent diseases and keep yourself healthy. What should I know about diet, weight, and exercise? Eat a healthy diet   Eat a diet that includes plenty of vegetables, fruits, low-fat dairy products, and lean protein.  Do not eat a lot of foods that are high in solid fats, added sugars, or sodium. Maintain a healthy weight Body mass index (BMI) is used to identify weight problems. It estimates body fat based on height and weight. Your health care provider can help determine your BMI and help you achieve or maintain a healthy weight. Get regular exercise Get regular exercise. This is one of the most important things you can do for your health. Most adults should:  Exercise for at least 150 minutes each week. The exercise should increase your heart rate and make you sweat (moderate-intensity exercise).  Do strengthening exercises at least twice a week. This is in addition to the  moderate-intensity exercise.  Spend less time sitting. Even light physical activity can be beneficial. Watch cholesterol and blood lipids Have your blood tested for lipids and cholesterol at 49 years of age, then have this test every 5 years. Have your cholesterol levels checked more often if:  Your lipid or cholesterol levels are high.  You are older than 49 years of age.  You are at high risk for heart disease. What should I know about cancer screening? Depending on your health history and family history, you may need to have cancer screening at various ages. This may include screening for:  Breast cancer.  Cervical cancer.  Colorectal cancer.  Skin cancer.  Lung cancer. What should I know about heart disease, diabetes, and high blood pressure? Blood pressure and heart disease  High blood pressure causes heart disease and increases the risk of stroke. This is more likely to develop in people who have high blood pressure readings, are of African descent, or are overweight.  Have your blood pressure checked: ? Every 3-5 years if you are 63-9 years of age. ? Every year if you are 70 years old or older.  Diabetes Have regular diabetes screenings. This checks your fasting blood sugar level. Have the screening done:  Once every three years after age 55 if you are at a normal weight and have a low risk for diabetes.  More often and at a younger age if you are overweight or have a high risk for diabetes. What should I know about preventing infection? Hepatitis B If you have a higher risk for hepatitis B, you should be screened for this virus. Talk with your health care provider to find out if you are at risk for hepatitis B infection. Hepatitis C Testing is recommended for:  Everyone born from [birth year] through 1965.  Anyone with known risk factors for hepatitis C. Sexually transmitted infections (STIs)  Get screened for STIs, including gonorrhea and chlamydia, if: ? You are  sexually active and are younger than 49 years of age. ? You are older than 49 years of age and your health care provider tells you that you are at risk for this type of infection. ? Your sexual activity has changed since you were last screened, and you are at increased risk for chlamydia or gonorrhea. Ask your health care provider if you are at risk.  Ask your health care provider about whether you are at high risk for HIV. Your health care provider may recommend a prescription medicine to help prevent HIV infection. If you choose to take medicine to prevent HIV, you should first get tested for HIV. You should then be tested every 3 months for as long as you are taking the medicine. Pregnancy  If you are about to stop having your period (premenopausal) and you may become pregnant, seek counseling before you get pregnant.  Take 400 to 800 micrograms (mcg) of folic acid every day if you become pregnant.  Ask for birth control (contraception) if you want to prevent pregnancy. Osteoporosis and menopause Osteoporosis is a disease in which the bones lose minerals and strength with aging. This can result in bone fractures. If you are 80 years old or older, or if you are at risk for osteoporosis and fractures, ask your health care provider if you should:  Be screened for bone loss.  Take a calcium or vitamin D supplement to lower your risk of fractures.  Be given hormone replacement therapy (HRT) to treat symptoms of menopause. Follow these instructions at home: Lifestyle  Do not use any products that contain nicotine or tobacco, such as cigarettes, e-cigarettes, and chewing tobacco. If you need help quitting, ask your health care provider.  Do not use street drugs.  Do not share needles.  Ask your health care provider for help if you need support or information about quitting drugs. Alcohol use  Do not drink alcohol if: ? Your health care provider tells you not to drink. ? You are  pregnant, may be pregnant, or are planning to become pregnant.  If you drink alcohol: ? Limit how much you use to 0-1 drink a day. ? Limit intake if you are breastfeeding.  Be aware of how much alcohol is in your drink. In the [place] one drink equals one 12 oz bottle of beer (355 mL), one 5 oz glass of wine (148 mL), or one 1 oz glass of hard liquor (44 mL). General instructions  Schedule regular health, dental, and eye exams.  Stay current with your vaccines.  Tell your health care provider if: ? You often feel depressed. ? You have ever been abused or do not feel safe  at home. Summary  Adopting a healthy lifestyle and getting preventive care are important in promoting health and wellness.  Follow your health care provider's instructions about healthy diet, exercising, and getting tested or screened for diseases.  Follow your health care provider's instructions on monitoring your cholesterol and blood pressure. This information is not intended to replace advice given to you by your health care provider. Make sure you discuss any questions you have with your health care provider. Document Revised: 05/13/2018 Document Reviewed: 05/13/2018 Elsevier Patient Education  2020 [employer].

## 2019-07-09 NOTE — Assessment & Plan Note (Signed)
HR under good control. Continue to follow with cardiology. Call with any concerns. Continue to monitor.  

## 2019-07-16 ENCOUNTER — Encounter: Payer: Self-pay | Admitting: Podiatry

## 2019-07-16 ENCOUNTER — Ambulatory Visit (INDEPENDENT_AMBULATORY_CARE_PROVIDER_SITE_OTHER): Payer: Medicare PPO

## 2019-07-16 ENCOUNTER — Other Ambulatory Visit: Payer: Self-pay | Admitting: Podiatry

## 2019-07-16 ENCOUNTER — Other Ambulatory Visit: Payer: Self-pay

## 2019-07-16 ENCOUNTER — Ambulatory Visit: Payer: Medicare Other | Admitting: Podiatry

## 2019-07-16 VITALS — BP 120/83 | HR 63

## 2019-07-16 DIAGNOSIS — M722 Plantar fascial fibromatosis: Secondary | ICD-10-CM | POA: Diagnosis not present

## 2019-07-16 MED ORDER — METHYLPREDNISOLONE 4 MG PO TBPK: ORAL_TABLET | ORAL | 0 refills | Status: DC

## 2019-07-20 NOTE — Progress Notes (Signed)
   Subjective: 49 y.o. female presenting today as a new patient with a chief complaint of posterior and plantar left heel pain that began about two months ago. She states the pain is constant and gradually worsening. She reports walking 4-5 miles daily for exercise which makes the pain worse. She states the pain starts when she first gets out the bed in the morning and worsens throughout the day. She has been taking Meloxicam and bought new shoes which helps alleviate the symptoms. Patient is here for further evaluation and treatment.   Past Medical History:  Diagnosis Date  . Chest pain    a cath 2/10. EF 50-55% normal coronaries  . Complete heart block ([place])   . Depression   . Dysautonomia ([place])   . Dyspnea    CT negative for PE, 2007  . Hypertension   . Insomnia   . OCD (obsessive compulsive disorder)   . SVT (supraventricular tachycardia) ([facility])    s/p ablation a. c/b AV nod ablation requiring pacemaker     Objective: Physical Exam General: The patient is alert and oriented x3 in no acute distress.  Dermatology: Skin is warm, dry and supple bilateral lower extremities. Negative for open lesions or macerations bilateral.   Vascular: Dorsalis Pedis and Posterior Tibial pulses palpable bilateral.  Capillary fill time is immediate to all digits.  Neurological: Epicritic and protective threshold intact bilateral.   Musculoskeletal: Tenderness to palpation to the plantar aspect of the left heel along the plantar fascia. All other joints range of motion within normal limits bilateral. Strength 5/5 in all groups bilateral.   Radiographic exam: Normal osseous mineralization. Joint spaces preserved. No fracture/dislocation/boney destruction. No other soft tissue abnormalities or radiopaque foreign bodies.   Assessment: 1. Plantar fasciitis left foot - medial and lateral   Plan of Care:  1. Patient evaluated. Xrays reviewed.   2. Injection of 0.5cc Celestone soluspan injected into  the left plantar fascia - medial.  3. Rx for Medrol Dose Pak placed. Then continue taking Meloxicam.  4. Continue wearing Hoka and Brooks shoes.  5. Instructed patient regarding therapies and modalities at home to alleviate symptoms.  6. Return to clinic in 4 weeks.    Works part time book keeping.    Edrick Kins, DPM [facility]  Dr. Edrick Kins, DPM    [address], [place] [address]                Office [phone]  Fax [phone]

## 2019-07-22 ENCOUNTER — Other Ambulatory Visit: Payer: Self-pay | Admitting: Family Medicine

## 2019-07-22 DIAGNOSIS — Z1231 Encounter for screening mammogram for malignant neoplasm of breast: Secondary | ICD-10-CM

## 2019-08-02 ENCOUNTER — Other Ambulatory Visit: Payer: Self-pay | Admitting: Family Medicine

## 2019-08-02 DIAGNOSIS — Z1231 Encounter for screening mammogram for malignant neoplasm of breast: Secondary | ICD-10-CM

## 2019-08-03 ENCOUNTER — Other Ambulatory Visit: Payer: Self-pay

## 2019-08-03 ENCOUNTER — Ambulatory Visit
Admission: RE | Admit: 2019-08-03 | Discharge: 2019-08-03 | Disposition: A | Discharge status: Home / Self Care | Payer: Medicare PPO | Source: Ambulatory Visit

## 2019-08-03 DIAGNOSIS — Z1231 Encounter for screening mammogram for malignant neoplasm of breast: Secondary | ICD-10-CM

## 2019-08-03 IMAGING — MG DIGITAL SCREENING BILAT W/ TOMO W/ CAD
8 series · 8 of 24 positions shown · non-contrast
Comparison: Previous exam(s).

CLINICAL DATA: Screening.

EXAM:
DIGITAL SCREENING BILATERAL MAMMOGRAM WITH TOMO AND CAD

[L MLO synth-2D]
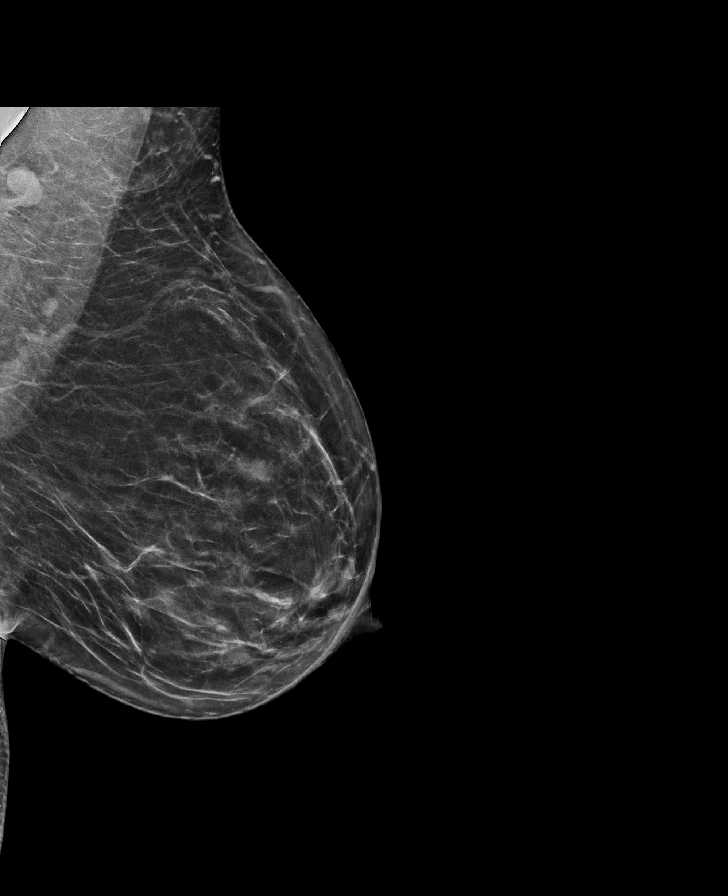

[R MLO synth-2D]
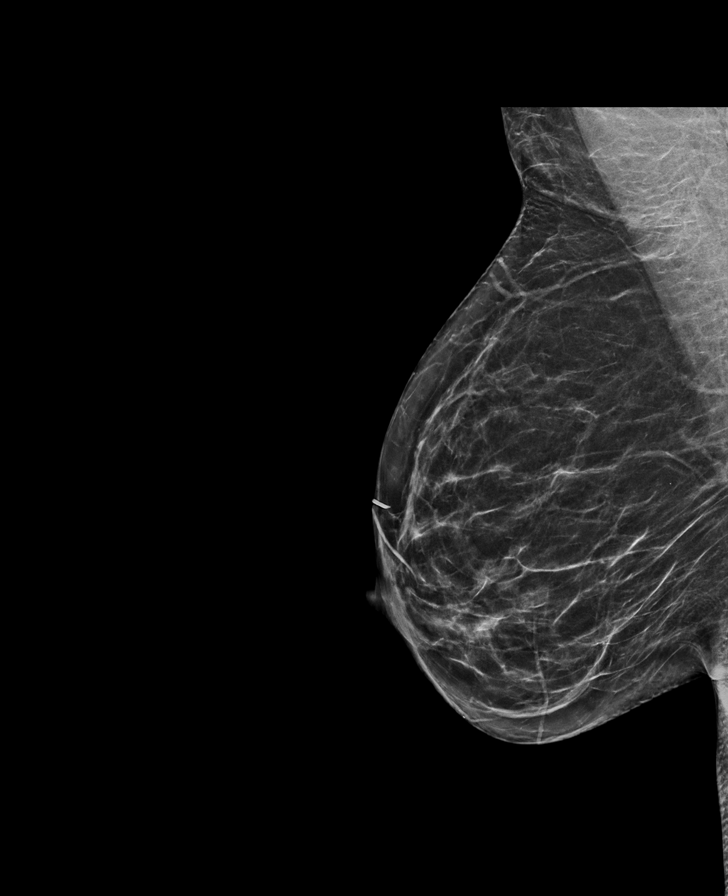

[L CC synth-2D]
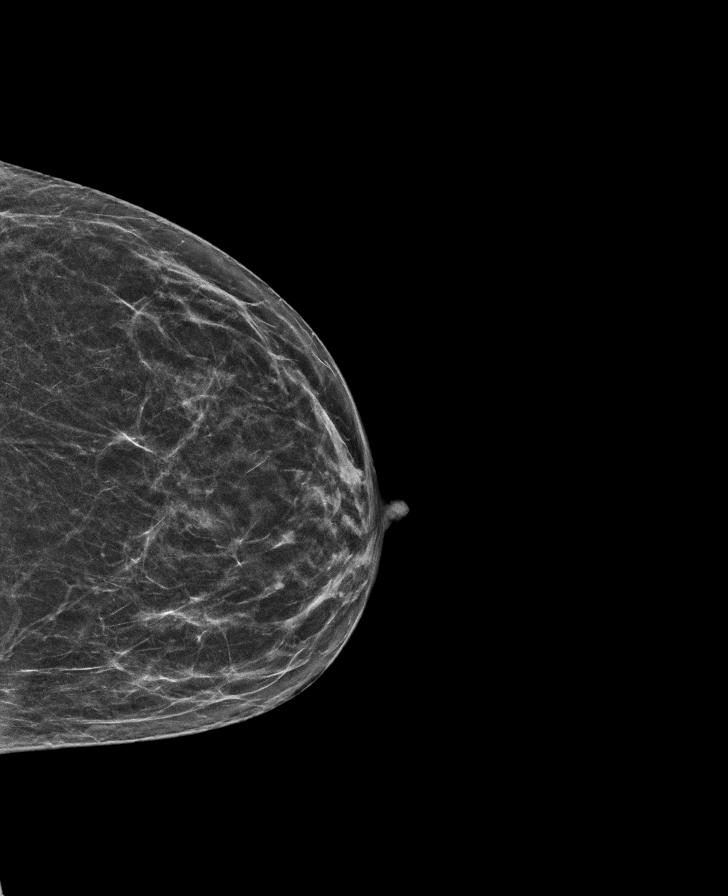

[R CC synth-2D]
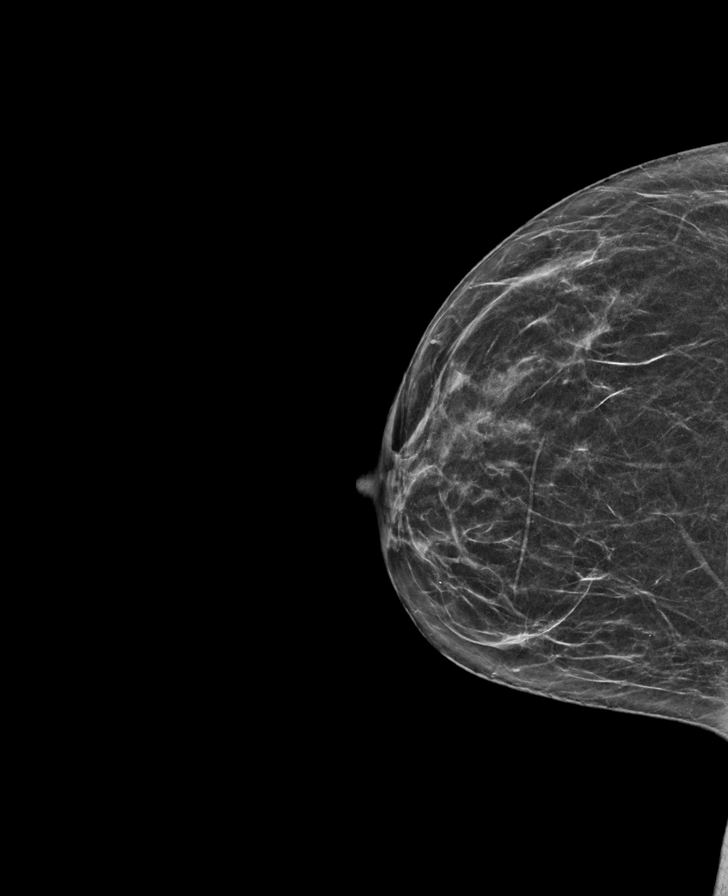

[L CC tomo · tomo slice 27/53.0]
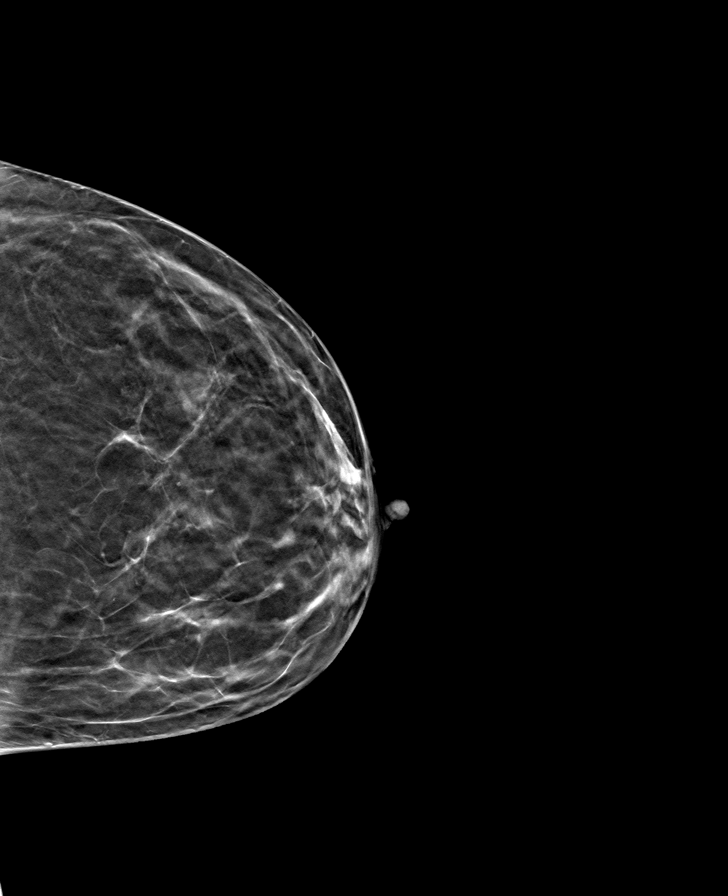

[L MLO tomo · tomo slice 33/66.0]
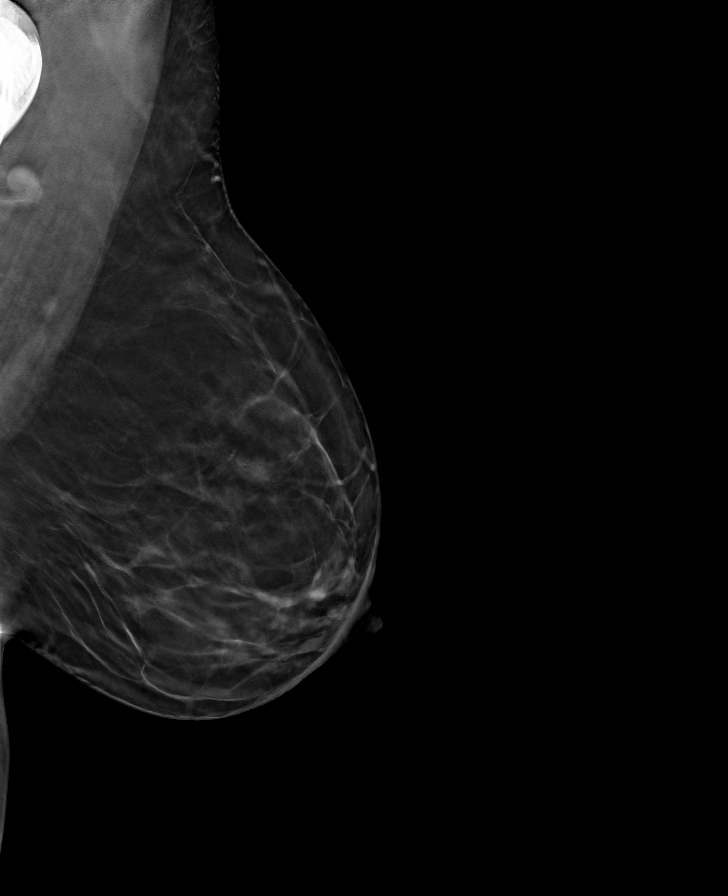

[R CC tomo · tomo slice 27/52.0]
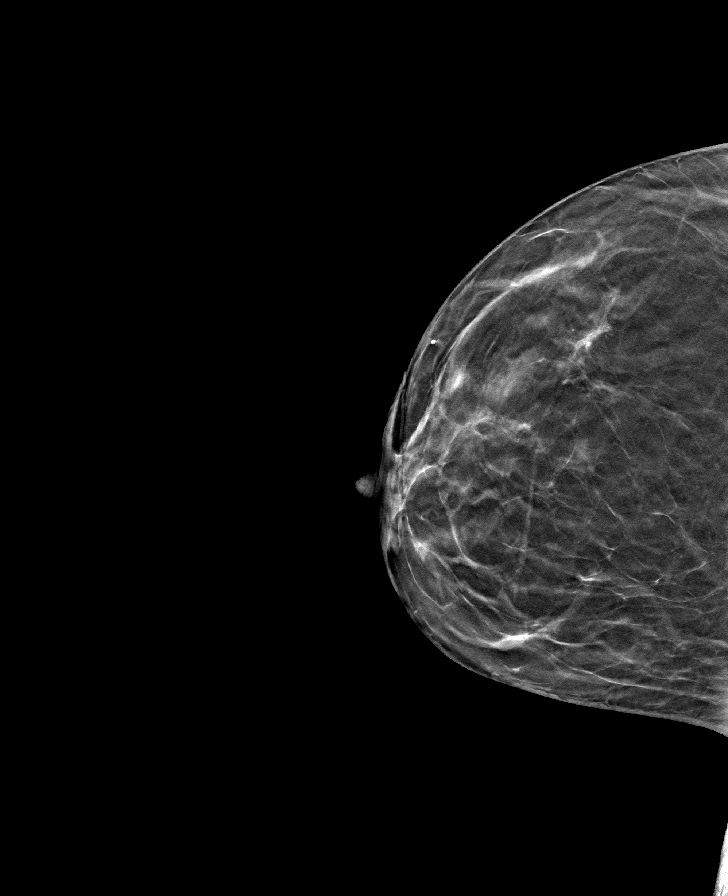

[R MLO tomo · tomo slice 31/62.0]
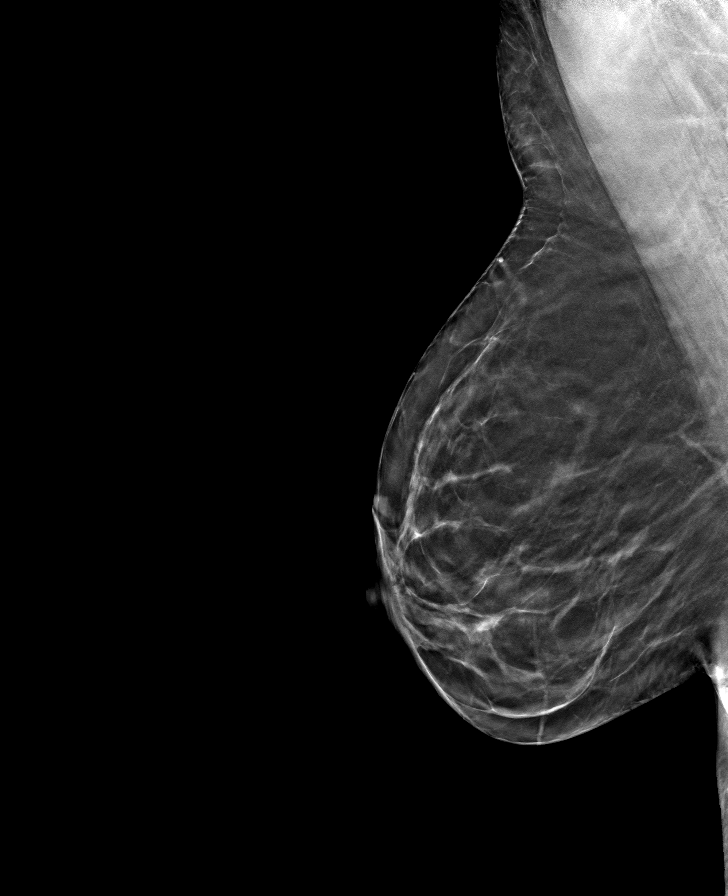

[8 of 24 positions shown; findings below may reference images not displayed]

ACR Breast Density Category b: There are scattered areas of
fibroglandular density.
FINDINGS: There are no findings suspicious for malignancy. Images were
processed with CAD.
IMPRESSION: No mammographic evidence of malignancy. A result letter of this
screening mammogram will be mailed directly to the patient.

RECOMMENDATION:
Screening mammogram in one year. (Code:[TQ])

BI-RADS CATEGORY  1: Negative.

## 2019-08-11 ENCOUNTER — Ambulatory Visit (INDEPENDENT_AMBULATORY_CARE_PROVIDER_SITE_OTHER): Payer: Medicare PPO | Admitting: *Deleted

## 2019-08-11 DIAGNOSIS — I442 Atrioventricular block, complete: Secondary | ICD-10-CM

## 2019-08-11 LAB — CUP PACEART REMOTE DEVICE CHECK
Battery Remaining Longevity: 46 mo
Battery Voltage: 2.98 V
Brady Statistic AP VP Percent: 15.05 %
Brady Statistic AP VS Percent: 0.04 %
Brady Statistic AS VP Percent: 84.89 %
Brady Statistic AS VS Percent: 0.02 %
Brady Statistic RA Percent Paced: 15.07 %
Brady Statistic RV Percent Paced: 99.91 %
Date Time Interrogation Session: 20210310104537
Implantable Lead Implant Date: 20070319
Implantable Lead Implant Date: 20070319
Implantable Lead Location: 753859
Implantable Lead Location: 753860
Implantable Lead Model: 5076
Implantable Lead Model: 5076
Implantable Pulse Generator Implant Date: 20160210
Lead Channel Impedance Value: 285 Ohm
Lead Channel Impedance Value: 399 Ohm
Lead Channel Impedance Value: 418 Ohm
Lead Channel Impedance Value: 475 Ohm
Lead Channel Pacing Threshold Amplitude: 0.75 V
Lead Channel Pacing Threshold Amplitude: 0.75 V
Lead Channel Pacing Threshold Pulse Width: 0.4 ms
Lead Channel Pacing Threshold Pulse Width: 0.4 ms
Lead Channel Sensing Intrinsic Amplitude: 3.625 mV
Lead Channel Sensing Intrinsic Amplitude: 3.625 mV
Lead Channel Sensing Intrinsic Amplitude: 4.125 mV
Lead Channel Sensing Intrinsic Amplitude: 4.125 mV
Lead Channel Setting Pacing Amplitude: 2 V
Lead Channel Setting Pacing Amplitude: 2.5 V
Lead Channel Setting Pacing Pulse Width: 0.4 ms
Lead Channel Setting Sensing Sensitivity: 2 mV

## 2019-08-11 NOTE — Progress Notes (Signed)
PPM Remote  

## 2019-08-13 ENCOUNTER — Other Ambulatory Visit: Payer: Self-pay

## 2019-08-13 ENCOUNTER — Ambulatory Visit: Payer: Medicare PPO | Admitting: Podiatry

## 2019-08-13 DIAGNOSIS — M722 Plantar fascial fibromatosis: Secondary | ICD-10-CM | POA: Diagnosis not present

## 2019-08-15 NOTE — Progress Notes (Signed)
   Subjective: 49 y.o. female presenting today for follow up evaluation of plantar fasciitis of the left foot. She states she is doing better and improving. She states the injection helped provided relief for about two weeks. She has been wearing [place] shoes which help alleviate her symptoms. She also reports taking Meloxicam daily. There are no worsening factors noted. Patient is here for further evaluation and treatment.   Past Medical History:  Diagnosis Date  . Chest pain    a cath 2/10. EF 50-55% normal coronaries  . Complete heart block ([place])   . Depression   . Dysautonomia ([place])   . Dyspnea    CT negative for PE, 2007  . Hypertension   . Insomnia   . OCD (obsessive compulsive disorder)   . SVT (supraventricular tachycardia) ([facility])    s/p ablation a. c/b AV nod ablation requiring pacemaker     Objective: Physical Exam General: The patient is alert and oriented x3 in no acute distress.  Dermatology: Skin is warm, dry and supple bilateral lower extremities. Negative for open lesions or macerations bilateral.   Vascular: Dorsalis Pedis and Posterior Tibial pulses palpable bilateral.  Capillary fill time is immediate to all digits.  Neurological: Epicritic and protective threshold intact bilateral.   Musculoskeletal: Tenderness to palpation to the plantar aspect of the left heel along the plantar fascia. All other joints range of motion within normal limits bilateral. Strength 5/5 in all groups bilateral.   Assessment: 1. Plantar fasciitis left foot   Plan of Care:  1. Patient evaluated.    2. Injection of 0.5cc Celestone soluspan injected into the left plantar fascia.  3. Continue taking Meloxicam.  4. Continue wearing Hoka and Brooks shoes.  5. Recommended Vionic sandals for the summer.  6. Return to clinic as needed.    Works part time book keeping.    Edrick Kins, DPM [facility]  Dr. Edrick Kins, DPM    [address], [place] [address]                Office [phone]  Fax [phone]

## 2019-08-16 ENCOUNTER — Other Ambulatory Visit: Payer: Self-pay

## 2019-08-16 ENCOUNTER — Ambulatory Visit (INDEPENDENT_AMBULATORY_CARE_PROVIDER_SITE_OTHER): Payer: Medicare PPO | Admitting: Gastroenterology

## 2019-08-16 ENCOUNTER — Encounter: Payer: Self-pay | Admitting: Gastroenterology

## 2019-08-16 VITALS — BP 126/82 | HR 72 | Temp 97.3°F | Ht 64.0 in | Wt 160.0 lb

## 2019-08-16 DIAGNOSIS — K5909 Other constipation: Secondary | ICD-10-CM | POA: Diagnosis not present

## 2019-08-16 DIAGNOSIS — Z1211 Encounter for screening for malignant neoplasm of colon: Secondary | ICD-10-CM

## 2019-08-16 NOTE — Progress Notes (Signed)
Gastroenterology Consultation  Referring Provider:     Valerie Roys, DO Primary Care Physician:  Valerie Roys, DO Primary Gastroenterologist:  Dr. Allen Norris     Reason for Consultation:     Constipation        HPI:   Angela [name] is a 49 y.o. y/o female referred for consultation & management of constipation by Dr. Wynetta Emery, Megan P, DO.  This patient comes in today with a history of constipation.  The patient reports that she had been well controlled with her Amitiza but it is no longer covered on her insurance.  The patient has had complete heart block after having a ablation and ended up with dysautonomia.  The patient states that she had been taking Amitiza in addition to Linzess 72 mcg.  She reports that she is now at the point where she ran out of her medication and eats very little so that she does not have to move her bowels and get constipated.  The patient denies any nausea vomiting fevers or chills.  The patient's last colonoscopy was by me over 10 years ago.  She is also due for repeat screening colonoscopy.  There is no report of any unexplained weight loss.  She denies any family history of colon cancer or colon polyps.  Past Medical History:  Diagnosis Date  . Chest pain    a cath 2/10. EF 50-55% normal coronaries  . Complete heart block ([place])   . Depression   . Dysautonomia ([place])   . Dyspnea    CT negative for PE, 2007  . Hypertension   . Insomnia   . OCD (obsessive compulsive disorder)   . SVT (supraventricular tachycardia) ([facility])    s/p ablation a. c/b AV nod ablation requiring pacemaker    Past Surgical History:  Procedure Laterality Date  . BREAST BIOPSY Right 1998  . BREAST BIOPSY Right 2004  . BREAST LUMPECTOMY     RIGHT -BREAST   . ENDOMETRIAL ABLATION  2005  . PELVIC LAPAROSCOPY  1990  . PERMANENT PACEMAKER GENERATOR CHANGE N/A 07/13/2014   MDT MRI compatible dual chamber pacemaker implanted by Dr Caryl Comes  . SPARK  2005  . TUBAL LIGATION  2003     Prior to Admission medications   Medication Sig Start Date End Date Taking? Authorizing Provider  Cyanocobalamin (VITAMIN B 12) 500 MCG TABS Take 1,000 mcg by mouth daily.    [provider]  linaclotide (LINZESS) 72 MCG capsule Take 1 capsule (72 mcg total) by mouth daily. 03/23/19   Guadalupe Maple, MD  LORazepam (ATIVAN) 0.5 MG tablet TAKE 1 TABLET(0.5 MG) BY MOUTH DAILY AS NEEDED FOR ANXIETY 05/25/19   Volney American, PA-C  lubiprostone (AMITIZA) 24 MCG capsule Take 1 capsule (24 mcg total) by mouth daily with breakfast. 03/23/19   Guadalupe Maple, MD  meloxicam (MOBIC) 15 MG tablet Take 1 tablet (15 mg total) by mouth daily. 03/23/19   Guadalupe Maple, MD  methylPREDNISolone (MEDROL DOSEPAK) 4 MG TBPK tablet 6 day dose pack - take as directed 07/16/19   Edrick Kins, DPM  metoprolol tartrate (LOPRESSOR) 50 MG tablet Take 1 tablet (50 mg total) by mouth daily. 03/23/19   Guadalupe Maple, MD  Multiple Vitamins-Minerals (MULTIVITAMINS THER. W/MINERALS) TABS Take 1 tablet by mouth daily.    [provider]  venlafaxine XR (EFFEXOR-XR) 150 MG 24 hr capsule Take 1 capsule (150 mg total) by mouth daily with breakfast. 03/23/19  Guadalupe Maple, MD    Family History  Problem Relation Age of Onset  . Hypertension Father   . Cancer Father 8       COLON  . Diabetes Father   . Breast cancer Paternal Aunt   . Hypertension Maternal Grandfather   . Diabetes Paternal Grandmother   . Breast cancer Paternal Grandmother   . Diabetes Paternal Grandfather   . Breast cancer Maternal Aunt        mastectomy  . Dementia Maternal Grandmother   . Breast cancer Paternal Aunt      Social History   Tobacco Use  . Smoking status: Never Smoker  . Smokeless tobacco: Never Used  Substance Use Topics  . Alcohol use: No  . Drug use: No    Allergies as of 08/16/2019 - Review Complete 08/13/2019  Allergen Reaction Noted  . Toprol xl [metoprolol succinate]  Photosensitivity 09/04/2011    Review of Systems:    All systems reviewed and negative except where noted in HPI.   Physical Exam:  There were no vitals taken for this visit. No LMP recorded. Patient has had an ablation. General:   Alert,  Well-developed, well-nourished, pleasant and cooperative in NAD Head:  Normocephalic and atraumatic. Eyes:  Sclera clear, no icterus.   Conjunctiva pink. Ears:  Normal auditory acuity. Neck:  Supple; no masses or thyromegaly. Lungs:  Respirations even and unlabored.  Clear throughout to auscultation.   No wheezes, crackles, or rhonchi. No acute distress. Heart:  Regular rate and rhythm; no murmurs, clicks, rubs, or gallops. Abdomen:  Normal bowel sounds.  No bruits.  Soft, non-tender and non-distended without masses, hepatosplenomegaly or hernias noted.  No guarding or rebound tenderness.  Negative Carnett sign.   Rectal:  Deferred.  Pulses:  Normal pulses noted. Extremities:  No clubbing or edema.  No cyanosis. Neurologic:  Alert and oriented x3;  grossly normal neurologically. Skin:  Intact without significant lesions or rashes.  No jaundice. Lymph Nodes:  No significant cervical adenopathy. Psych:  Alert and cooperative. Normal mood and affect.  Imaging Studies: MM 3D SCREEN BREAST BILATERAL  Result Date: 08/04/2019 CLINICAL DATA:  Screening. EXAM: DIGITAL SCREENING BILATERAL MAMMOGRAM WITH TOMO AND CAD COMPARISON:  Previous exam(s). ACR Breast Density Category b: There are scattered areas of fibroglandular density. FINDINGS: There are no findings suspicious for malignancy. Images were processed with CAD. IMPRESSION: No mammographic evidence of malignancy. A result letter of this screening mammogram will be mailed directly to the patient. RECOMMENDATION: Screening mammogram in one year. (Code:SM-B-01Y) BI-RADS CATEGORY  1: Negative. Electronically Signed   By: Kristopher Oppenheim M.D.   On: 08/04/2019 08:30   CUP PACEART REMOTE DEVICE CHECK  Result  Date: 08/11/2019 Scheduled remote reviewed. Normal device function.  One NSVT noted (6 beats). Next remote 91 days. Felisa Bonier, RN, MSN   Assessment and Plan:   Angela [name] is a 49 y.o. y/o female comes in today with a history of chronic constipation and her insurance no longer covers her Amitiza.  The patient has taken the Linzess 72 mcg and reports that at times she has to take more than 1 a day.  The patient has been given samples of Linzess 145 mcg and 290 mcg to see if that helps her symptoms and therefore not needing to take multiple 72 mcg tablets a day.  The patient will also be set up for screening colonoscopy.  The patient has been explained the plan and agrees with it.  Lucilla Lame, MD. Marval Regal    Note: This dictation was prepared with Dragon dictation along with smaller phrase technology. Any transcriptional errors that result from this process are unintentional.

## 2019-08-19 ENCOUNTER — Telehealth: Payer: Self-pay | Admitting: Family Medicine

## 2019-08-19 NOTE — Telephone Encounter (Signed)
   [facility] 08/19/2019  Name: Angela [name]   MRN: [id number]   DOB: 1971-03-09   AGE: 49 y.o.   GENDER: female   PCP Park Liter P, DO.   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 [name] stated that since she is on a pacemaker they have had trouble finding a policy that will cover her. She basically wants a burial policy the minimum so that if she were to pass her husband wouldn't have to pay for an expensive funeral. Her father is a veteran so she may be eligible for [employer] policy. She was out walking so I will email her the information along with [facility] information.  Closing referral pending any other needs of patient.  [place] . [email or web address]  [phone]

## 2019-08-30 ENCOUNTER — Other Ambulatory Visit: Payer: Self-pay

## 2019-08-30 ENCOUNTER — Ambulatory Visit: Payer: Medicare PPO | Attending: Internal Medicine

## 2019-08-30 DIAGNOSIS — Z23 Encounter for immunization: Secondary | ICD-10-CM

## 2019-08-30 NOTE — Progress Notes (Signed)
   Covid-19 Vaccination Clinic  Name:  Angela [name]    MRN: [id number] DOB: 12/10/1970  08/30/2019  [name] was observed post Covid-19 immunization for 15 minutes without incident. She was provided with Vaccine Information Sheet and instruction to access the V-Safe system.   [name] was instructed to call 911 with any severe reactions post vaccine: Marland Kitchen Difficulty breathing  . Swelling of face and throat  . A fast heartbeat  . A bad rash all over body  . Dizziness and weakness   Immunizations Administered    Name Date Dose VIS Date Route   Pfizer COVID-19 Vaccine 08/30/2019  9:37 AM 0.3 mL 05/14/2019 Intramuscular   Manufacturer: [place]   Lot: [id number]   [place]: [id number]

## 2019-09-03 ENCOUNTER — Other Ambulatory Visit: Payer: Self-pay

## 2019-09-03 ENCOUNTER — Other Ambulatory Visit
Admission: RE | Admit: 2019-09-03 | Discharge: 2019-09-03 | Disposition: A | Discharge status: Home / Self Care | Payer: Medicare PPO | Source: Ambulatory Visit | Attending: Gastroenterology | Admitting: Gastroenterology

## 2019-09-03 DIAGNOSIS — Z20822 Contact with and (suspected) exposure to covid-19: Secondary | ICD-10-CM | POA: Diagnosis not present

## 2019-09-03 DIAGNOSIS — Z01812 Encounter for preprocedural laboratory examination: Secondary | ICD-10-CM | POA: Insufficient documentation

## 2019-09-03 LAB — SARS CORONAVIRUS 2 (TAT 6-24 HRS): SARS Coronavirus 2: NEGATIVE

## 2019-09-07 ENCOUNTER — Encounter
Admission: RE | Disposition: A | Discharge status: Home / Self Care | Payer: Self-pay | Source: Home / Self Care | Attending: Gastroenterology

## 2019-09-07 ENCOUNTER — Ambulatory Visit
Admission: RE | Admit: 2019-09-07 | Discharge: 2019-09-07 | Disposition: A | Discharge status: Home / Self Care | Payer: Medicare PPO | Attending: Gastroenterology | Admitting: Gastroenterology

## 2019-09-07 ENCOUNTER — Ambulatory Visit: Payer: Medicare PPO | Admitting: Anesthesiology

## 2019-09-07 ENCOUNTER — Other Ambulatory Visit: Payer: Self-pay

## 2019-09-07 ENCOUNTER — Encounter: Payer: Self-pay | Admitting: Gastroenterology

## 2019-09-07 DIAGNOSIS — Z1211 Encounter for screening for malignant neoplasm of colon: Secondary | ICD-10-CM | POA: Diagnosis not present

## 2019-09-07 DIAGNOSIS — K640 First degree hemorrhoids: Secondary | ICD-10-CM | POA: Insufficient documentation

## 2019-09-07 DIAGNOSIS — Z791 Long term (current) use of non-steroidal anti-inflammatories (NSAID): Secondary | ICD-10-CM | POA: Diagnosis not present

## 2019-09-07 DIAGNOSIS — I471 Supraventricular tachycardia: Secondary | ICD-10-CM | POA: Diagnosis not present

## 2019-09-07 DIAGNOSIS — Z79899 Other long term (current) drug therapy: Secondary | ICD-10-CM | POA: Insufficient documentation

## 2019-09-07 DIAGNOSIS — G4700 Insomnia, unspecified: Secondary | ICD-10-CM | POA: Diagnosis not present

## 2019-09-07 DIAGNOSIS — Z950 Presence of cardiac pacemaker: Secondary | ICD-10-CM | POA: Insufficient documentation

## 2019-09-07 DIAGNOSIS — F429 Obsessive-compulsive disorder, unspecified: Secondary | ICD-10-CM | POA: Insufficient documentation

## 2019-09-07 DIAGNOSIS — D128 Benign neoplasm of rectum: Secondary | ICD-10-CM | POA: Diagnosis not present

## 2019-09-07 DIAGNOSIS — K621 Rectal polyp: Secondary | ICD-10-CM

## 2019-09-07 DIAGNOSIS — G901 Familial dysautonomia [Riley-Day]: Secondary | ICD-10-CM | POA: Insufficient documentation

## 2019-09-07 DIAGNOSIS — F329 Major depressive disorder, single episode, unspecified: Secondary | ICD-10-CM | POA: Diagnosis not present

## 2019-09-07 DIAGNOSIS — I442 Atrioventricular block, complete: Secondary | ICD-10-CM | POA: Diagnosis not present

## 2019-09-07 DIAGNOSIS — I4891 Unspecified atrial fibrillation: Secondary | ICD-10-CM | POA: Diagnosis not present

## 2019-09-07 DIAGNOSIS — I10 Essential (primary) hypertension: Secondary | ICD-10-CM | POA: Diagnosis not present

## 2019-09-07 DIAGNOSIS — Z888 Allergy status to other drugs, medicaments and biological substances status: Secondary | ICD-10-CM | POA: Insufficient documentation

## 2019-09-07 DIAGNOSIS — K635 Polyp of colon: Secondary | ICD-10-CM | POA: Diagnosis not present

## 2019-09-07 HISTORY — PX: COLONOSCOPY WITH PROPOFOL: SHX5780

## 2019-09-07 SURGERY — COLONOSCOPY WITH PROPOFOL
Anesthesia: General

## 2019-09-07 MED ORDER — PROPOFOL 500 MG/50ML IV EMUL
INTRAVENOUS | Status: DC | PRN
  Administered 2019-09-07: 140 ug/kg/min via INTRAVENOUS

## 2019-09-07 MED ORDER — LINACLOTIDE 145 MCG PO CAPS: 145.0000 ug | ORAL_CAPSULE | Freq: Every day | ORAL | 5 refills | Status: DC

## 2019-09-07 MED ORDER — SODIUM CHLORIDE 0.9 % IV SOLN: INTRAVENOUS | Status: DC

## 2019-09-07 MED ORDER — LIDOCAINE HCL (CARDIAC) PF 100 MG/5ML IV SOSY
PREFILLED_SYRINGE | INTRAVENOUS | Status: DC | PRN
  Administered 2019-09-07: 100 mg via INTRAVENOUS

## 2019-09-07 MED ORDER — PROPOFOL 10 MG/ML IV BOLUS
INTRAVENOUS | Status: DC | PRN
  Administered 2019-09-07: 80 mg via INTRAVENOUS

## 2019-09-07 NOTE — Transfer of Care (Signed)
Immediate Anesthesia Transfer of Care Note  Patient: Angela [name]  Procedure(s) Performed: COLONOSCOPY WITH PROPOFOL (N/A )  Patient Location: PACU  Anesthesia Type:General  Level of Consciousness: sedated  Airway & Oxygen Therapy: Patient Spontanous Breathing  Post-op Assessment: Report given to RN and Post -op Vital signs reviewed and stable  Post vital signs: Reviewed and stable  Last Vitals:  Vitals Value Taken Time  BP 108/84 09/07/19 0856  Temp    Pulse 77 09/07/19 0856  Resp 12 09/07/19 0856  SpO2 98 % 09/07/19 0856  Vitals shown include unvalidated device data.  Last Pain:  Vitals:   09/07/19 0749  TempSrc: Temporal  PainSc: 0-No pain         Complications: No apparent anesthesia complications

## 2019-09-07 NOTE — Op Note (Signed)
[facility] Gastroenterology Patient Name: Angela [name] Procedure Date: 09/07/2019 8:26 AM MRN: [id number] Account #: [id number] Date of Birth: [birth year] Admit Type: Outpatient Age: 49 Room: [facility] ENDO ROOM 4 Gender: Female Note Status: Finalized Procedure:             Colonoscopy Indications:           Screening for colorectal malignant neoplasm Providers:             Lucilla Lame MD, MD Referring MD:          Valerie Roys (Referring MD) Medicines:             Propofol per Anesthesia Complications:         No immediate complications. Procedure:             Pre-Anesthesia Assessment:                        - Prior to the procedure, a History and Physical was                         performed, and patient medications and allergies were                         reviewed. The patient's tolerance of previous                         anesthesia was also reviewed. The risks and benefits                         of the procedure and the sedation options and risks                         were discussed with the patient. All questions were                         answered, and informed consent was obtained. Prior                         Anticoagulants: The patient has taken no previous                         anticoagulant or antiplatelet agents. ASA Grade                         Assessment: II - A patient with mild systemic disease.                         After reviewing the risks and benefits, the patient                         was deemed in satisfactory condition to undergo the                         procedure.                        After obtaining informed consent, the colonoscope was  passed under direct vision. Throughout the procedure,                         the patient's blood pressure, pulse, and oxygen                         saturations were monitored continuously. The                         Colonoscope was introduced through the  anus and                         advanced to the the cecum, identified by appendiceal                         orifice and ileocecal valve. The colonoscopy was                         performed without difficulty. The patient tolerated                         the procedure well. The quality of the bowel                         preparation was excellent. Findings:      The perianal and digital rectal examinations were normal.      A 3 mm polyp was found in the rectum. The polyp was sessile. The polyp       was removed with a cold biopsy forceps. Resection and retrieval were       complete.      Non-bleeding internal hemorrhoids were found during retroflexion. The       hemorrhoids were Grade I (internal hemorrhoids that do not prolapse). Impression:            - One 3 mm polyp in the rectum, removed with a cold                         biopsy forceps. Resected and retrieved.                        - Non-bleeding internal hemorrhoids. Recommendation:        - Discharge patient to home.                        - Resume previous diet.                        - Continue present medications.                        - Await pathology results.                        - Repeat colonoscopy in 5 years if polyp adenoma and                         10 years if hyperplastic Procedure Code(s):     --- Professional ---  45380, Colonoscopy, flexible; with biopsy, single or                         multiple Diagnosis Code(s):     --- Professional ---                        Z12.11, Encounter for screening for malignant neoplasm                         of colon                        K62.1, Rectal polyp CPT copyright 2019 American Medical Association. All rights reserved. The codes documented in this report are preliminary and upon coder review may  be revised to meet current compliance requirements. Lucilla Lame MD, MD 09/07/2019 8:52:57 AM This report has been signed  electronically. Number of Addenda: 0 Note Initiated On: 09/07/2019 8:26 AM Scope Withdrawal Time: 0 hours 9 minutes 19 seconds  Total Procedure Duration: 0 hours 13 minutes 26 seconds  Estimated Blood Loss:  Estimated blood loss: none.      [facility]

## 2019-09-07 NOTE — Anesthesia Postprocedure Evaluation (Signed)
Anesthesia Post Note  Patient: Angela [name]  Procedure(s) Performed: COLONOSCOPY WITH PROPOFOL (N/A )  Patient location during evaluation: Endoscopy Anesthesia Type: General Level of consciousness: awake and alert and oriented Pain management: pain level controlled Vital Signs Assessment: post-procedure vital signs reviewed and stable Respiratory status: spontaneous breathing Cardiovascular status: blood pressure returned to baseline Anesthetic complications: no     Last Vitals:  Vitals:   09/07/19 0856 09/07/19 0906  BP: 108/84 124/75  Pulse: 77   Resp: 12   Temp: 36.4 C   SpO2: 97%     Last Pain:  Vitals:   09/07/19 0926  TempSrc:   PainSc: 0-No pain                 Alyaan Budzynski

## 2019-09-07 NOTE — H&P (Signed)
Lucilla Lame, MD [address] [place], [place] [address] Phone: [phone] Fax : [phone]  Primary Care Physician:  Valerie Roys, DO Primary Gastroenterologist:  Dr. Allen Norris  Pre-Procedure History & Physical: HPI:  Angela [name] is a 49 y.o. female is here for a screening colonoscopy.   Past Medical History:  Diagnosis Date  . Chest pain    a cath 2/10. EF 50-55% normal coronaries  . Complete heart block ([place])   . Depression   . Dysautonomia ([place])   . Dyspnea    CT negative for PE, 2007  . Hypertension   . Insomnia   . OCD (obsessive compulsive disorder)   . SVT (supraventricular tachycardia) (HCC)    s/p ablation a. c/b AV nod ablation requiring pacemaker    Past Surgical History:  Procedure Laterality Date  . BREAST BIOPSY Right 1998  . BREAST BIOPSY Right 2004  . BREAST LUMPECTOMY     RIGHT -BREAST   . ENDOMETRIAL ABLATION  2005  . INSERT / REPLACE / REMOVE PACEMAKER    . PELVIC LAPAROSCOPY  1990  . PERMANENT PACEMAKER GENERATOR CHANGE N/A 07/13/2014   MDT MRI compatible dual chamber pacemaker implanted by Dr Caryl Comes  . SPARK  2005  . TUBAL LIGATION  2003    Prior to Admission medications   Medication Sig Start Date End Date Taking? Authorizing Provider  Cyanocobalamin (VITAMIN B 12) 500 MCG TABS Take 1,000 mcg by mouth daily.   Yes [provider]  linaclotide (LINZESS) 72 MCG capsule Take 1 capsule (72 mcg total) by mouth daily. 03/23/19  Yes Guadalupe Maple, MD  LORazepam (ATIVAN) 0.5 MG tablet TAKE 1 TABLET(0.5 MG) BY MOUTH DAILY AS NEEDED FOR ANXIETY 05/25/19  Yes Volney American, PA-C  meloxicam (MOBIC) 15 MG tablet Take 1 tablet (15 mg total) by mouth daily. 03/23/19  Yes Guadalupe Maple, MD  methylPREDNISolone (MEDROL DOSEPAK) 4 MG TBPK tablet 6 day dose pack - take as directed 07/16/19  Yes Edrick Kins, DPM  metoprolol tartrate (LOPRESSOR) 50 MG tablet Take 1 tablet (50 mg total) by mouth daily. 03/23/19  Yes  Guadalupe Maple, MD  Multiple Vitamins-Minerals (MULTIVITAMINS THER. W/MINERALS) TABS Take 1 tablet by mouth daily.   Yes [provider]  venlafaxine XR (EFFEXOR-XR) 150 MG 24 hr capsule Take 1 capsule (150 mg total) by mouth daily with breakfast. 03/23/19  Yes Crissman, Jeannette How, MD  lubiprostone (AMITIZA) 24 MCG capsule Take 1 capsule (24 mcg total) by mouth daily with breakfast. Patient not taking: Reported on 09/07/2019 03/23/19   Guadalupe Maple, MD    Allergies as of 08/17/2019 - Review Complete 08/16/2019  Allergen Reaction Noted  . Toprol xl [metoprolol succinate] Photosensitivity 09/04/2011    Family History  Problem Relation Age of Onset  . Hypertension Father   . Cancer Father 67       COLON  . Diabetes Father   . Breast cancer Paternal Aunt   . Hypertension Maternal Grandfather   . Diabetes Paternal Grandmother   . Breast cancer Paternal Grandmother   . Diabetes Paternal Grandfather   . Breast cancer Maternal Aunt        mastectomy  . Dementia Maternal Grandmother   . Breast cancer Paternal Aunt     Social History   Socioeconomic History  . Marital status: Married    Spouse name: Not on file  . Number of children: Not on file  . Years of education:  Not on file  . Highest education level: Bachelor's degree (e.g., BA, AB, BS)  Occupational History  . Occupation: disability   Tobacco Use  . Smoking status: Never Smoker  . Smokeless tobacco: Never Used  Substance and Sexual Activity  . Alcohol use: No  . Drug use: No  . Sexual activity: Yes    Birth control/protection: Surgical  Other Topics Concern  . Not on file  Social History Narrative   Work with daughters school    Social Determinants of Health   Financial Resource Strain:   . Difficulty of Paying Living Expenses:   Food Insecurity:   . Worried About Charity fundraiser in the Last Year:   . Arboriculturist in the Last Year:   Transportation Needs:   . Film/video editor (Medical):    Marland Kitchen Lack of Transportation (Non-Medical):   Physical Activity:   . Days of Exercise per Week:   . Minutes of Exercise per Session:   Stress:   . Feeling of Stress :   Social Connections:   . Frequency of Communication with Friends and Family:   . Frequency of Social Gatherings with Friends and Family:   . Attends Religious Services:   . Active Member of Clubs or Organizations:   . Attends Archivist Meetings:   Marland Kitchen Marital Status:   Intimate Partner Violence:   . Fear of Current or Ex-Partner:   . Emotionally Abused:   Marland Kitchen Physically Abused:   . Sexually Abused:     Review of Systems: See HPI, otherwise negative ROS  Physical Exam: BP 139/84   Pulse 66   Temp (!) 97.3 F (36.3 C) (Temporal)   Resp 16   Ht 5\' 4"  (1.626 m)   Wt 71.7 kg   SpO2 100%   BMI 27.12 kg/m  General:   Alert,  pleasant and cooperative in NAD Head:  Normocephalic and atraumatic. Neck:  Supple; no masses or thyromegaly. Lungs:  Clear throughout to auscultation.    Heart:  Regular rate and rhythm. Abdomen:  Soft, nontender and nondistended. Normal bowel sounds, without guarding, and without rebound.   Neurologic:  Alert and  oriented x4;  grossly normal neurologically.  Impression/Plan: Angela [name] is now here to undergo a screening colonoscopy.  Risks, benefits, and alternatives regarding colonoscopy have been reviewed with the patient.  Questions have been answered.  All parties agreeable.

## 2019-09-07 NOTE — Anesthesia Preprocedure Evaluation (Addendum)
Anesthesia Evaluation  Patient identified by MRN, date of birth, ID band Patient awake    Reviewed: Allergy & Precautions, NPO status , Patient's Chart, lab work & pertinent test results  Airway Mallampati: II       Dental  (+) Teeth Intact   Pulmonary shortness of breath and with exertion,    Pulmonary exam normal        Cardiovascular hypertension, Normal cardiovascular exam+ dysrhythmias Atrial Fibrillation and Supra Ventricular Tachycardia + pacemaker   Complete heart block   Neuro/Psych PSYCHIATRIC DISORDERS Anxiety Depression OCDnegative neurological ROS     GI/Hepatic Neg liver ROS,   Endo/Other  negative endocrine ROS  Renal/GU negative Renal ROS  negative genitourinary   Musculoskeletal negative musculoskeletal ROS (+)   Abdominal Normal abdominal exam  (+)   Peds negative pediatric ROS (+)  Hematology negative hematology ROS (+)   Anesthesia Other Findings EF 50-55% Medtronic pacemaker  Reproductive/Obstetrics                            Anesthesia Physical Anesthesia Plan  ASA: III  Anesthesia Plan: General   Post-op Pain Management:    Induction: Intravenous  PONV Risk Score and Plan: Propofol infusion  Airway Management Planned: Nasal Cannula  Additional Equipment:   Intra-op Plan:   Post-operative Plan:   Informed Consent: I have reviewed the patients History and Physical, chart, labs and discussed the procedure including the risks, benefits and alternatives for the proposed anesthesia with the patient or authorized representative who has indicated his/her understanding and acceptance.     Dental advisory given  Plan Discussed with: CRNA and Surgeon  Anesthesia Plan Comments:         Anesthesia Quick Evaluation

## 2019-09-08 ENCOUNTER — Encounter: Payer: Self-pay | Admitting: *Deleted

## 2019-09-08 LAB — SURGICAL PATHOLOGY

## 2019-09-09 ENCOUNTER — Encounter: Payer: Self-pay | Admitting: Gastroenterology

## 2019-09-17 ENCOUNTER — Other Ambulatory Visit: Payer: Self-pay | Admitting: Podiatry

## 2019-09-22 ENCOUNTER — Ambulatory Visit: Payer: Medicare PPO | Attending: Internal Medicine

## 2019-09-22 DIAGNOSIS — Z23 Encounter for immunization: Secondary | ICD-10-CM

## 2019-09-22 NOTE — Progress Notes (Signed)
   Covid-19 Vaccination Clinic  Name:  Angela [name]    MRN: [id number] DOB: January 13, 1971  09/22/2019  [name] was observed post Covid-19 immunization for 15 minutes without incident. She was provided with Vaccine Information Sheet and instruction to access the V-Safe system.   [name] was instructed to call 911 with any severe reactions post vaccine: Marland Kitchen Difficulty breathing  . Swelling of face and throat  . A fast heartbeat  . A bad rash all over body  . Dizziness and weakness   Immunizations Administered    Name Date Dose VIS Date Route   Pfizer COVID-19 Vaccine 09/22/2019 11:37 AM 0.3 mL 07/28/2018 Intramuscular   Manufacturer: [place]   Lot: [id number]   [place]: [id number]

## 2019-09-24 ENCOUNTER — Encounter: Payer: Self-pay | Admitting: Podiatry

## 2019-09-24 ENCOUNTER — Ambulatory Visit: Payer: Medicare PPO | Admitting: Podiatry

## 2019-09-24 ENCOUNTER — Other Ambulatory Visit: Payer: Self-pay

## 2019-09-24 VITALS — Temp 97.3°F

## 2019-09-24 DIAGNOSIS — M722 Plantar fascial fibromatosis: Secondary | ICD-10-CM

## 2019-09-24 MED ORDER — METHYLPREDNISOLONE 4 MG PO TBPK: ORAL_TABLET | ORAL | 0 refills | Status: DC

## 2019-09-28 NOTE — Progress Notes (Signed)
   Subjective: 49 y.o. female presenting today for follow up evaluation of plantar fasciitis of the left foot. She states the pain had improved and she was doing well until two weeks ago when she stepped down on the foot the wrong way, causing the pain to begin again. Walking increases the pain. She has been using a night splint and Lidocaine patches with some relief. Patient is here for further evaluation and treatment.   Past Medical History:  Diagnosis Date  . Chest pain    a cath 2/10. EF 50-55% normal coronaries  . Complete heart block ([place])   . Depression   . Dysautonomia ([place])   . Dyspnea    CT negative for PE, 2007  . Hypertension   . Insomnia   . OCD (obsessive compulsive disorder)   . SVT (supraventricular tachycardia) (HCC)    s/p ablation a. c/b AV nod ablation requiring pacemaker     Objective: Physical Exam General: The patient is alert and oriented x3 in no acute distress.  Dermatology: Skin is warm, dry and supple bilateral lower extremities. Negative for open lesions or macerations bilateral.   Vascular: Dorsalis Pedis and Posterior Tibial pulses palpable bilateral.  Capillary fill time is immediate to all digits.  Neurological: Epicritic and protective threshold intact bilateral.   Musculoskeletal: Tenderness to palpation to the plantar aspect of the left heel along the plantar fascia. All other joints range of motion within normal limits bilateral. Strength 5/5 in all groups bilateral.   Assessment: 1. Plantar fasciitis left foot   Plan of Care:  1. Patient evaluated.    2. Injection of 0.5cc Celestone soluspan injected into the left plantar fascia.  3. Prescription for Medrol Dose Pak provided to patient. Then continue taking Meloxicam.  4. Continue wearing [place] and Brooks shoes.  5. Recommended Vionic sandals for the summer.  6. Return to clinic in 6 weeks. If not better, we will discuss surgery.    Works part time book keeping. Enjoys being an Merchandiser, retail but daughter is coming home from college. Going to [place] in December.    Edrick Kins, DPM [facility]  Dr. Edrick Kins, DPM    [address], [place] [address]                Office [phone]  Fax [phone]

## 2019-11-05 ENCOUNTER — Other Ambulatory Visit: Payer: Self-pay

## 2019-11-05 ENCOUNTER — Ambulatory Visit: Payer: Medicare PPO | Admitting: Podiatry

## 2019-11-05 ENCOUNTER — Encounter: Payer: Self-pay | Admitting: Podiatry

## 2019-11-05 DIAGNOSIS — M722 Plantar fascial fibromatosis: Secondary | ICD-10-CM

## 2019-11-05 NOTE — Patient Instructions (Signed)
Vionic Sandals

## 2019-11-07 NOTE — Progress Notes (Signed)
   Subjective: 49 y.o. female presenting today for follow up evaluation of plantar fasciitis of the left foot.  Patient states that she is doing much better.  The stabbing sensation she was feeling is gone.  She went for a hike Monday and she felt like possibly she pulled a muscle but otherwise she is doing very well.  She completed her Medrol Dosepak which helped and significantly.  She also states that she and the injection she received last visit helped.   Past Medical History:  Diagnosis Date  . Chest pain    a cath 2/10. EF 50-55% normal coronaries  . Complete heart block ([place])   . Depression   . Dysautonomia ([place])   . Dyspnea    CT negative for PE, 2007  . Hypertension   . Insomnia   . OCD (obsessive compulsive disorder)   . SVT (supraventricular tachycardia) (HCC)    s/p ablation a. c/b AV nod ablation requiring pacemaker     Objective: Physical Exam General: The patient is alert and oriented x3 in no acute distress.  Dermatology: Skin is warm, dry and supple bilateral lower extremities. Negative for open lesions or macerations bilateral.   Vascular: Dorsalis Pedis and Posterior Tibial pulses palpable bilateral.  Capillary fill time is immediate to all digits.  Neurological: Epicritic and protective threshold intact bilateral.   Musculoskeletal: Improved tenderness to palpation to the plantar aspect of the left heel along the plantar fascia. All other joints range of motion within normal limits bilateral. Strength 5/5 in all groups bilateral.   Assessment: 1. Plantar fasciitis left foot   Plan of Care:  1. Patient evaluated.    2.  Continue meloxicam daily as needed 3.  Continue daily stretching exercises 4.  Continue wearing [place] and Brooks shoes 5.  Recommend bionic sandals for the summer. 6.  Return to clinic as needed.  The patient has dealt with this condition for 6 a long period of time and if the symptoms recur we may discuss surgery in the future  Works  part time book keeping. 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 Going to [place] in December.    Edrick Kins, DPM [facility]  Dr. Edrick Kins, DPM    [address], [place] [address]                Office [phone]  Fax [phone]

## 2019-11-10 ENCOUNTER — Ambulatory Visit (INDEPENDENT_AMBULATORY_CARE_PROVIDER_SITE_OTHER): Payer: Medicare PPO | Admitting: *Deleted

## 2019-11-10 DIAGNOSIS — I442 Atrioventricular block, complete: Secondary | ICD-10-CM | POA: Diagnosis not present

## 2019-11-10 LAB — CUP PACEART REMOTE DEVICE CHECK
Battery Remaining Longevity: 40 mo
Battery Voltage: 2.98 V
Brady Statistic AP VP Percent: 17.79 %
Brady Statistic AP VS Percent: 0.07 %
Brady Statistic AS VP Percent: 82.1 %
Brady Statistic AS VS Percent: 0.04 %
Brady Statistic RA Percent Paced: 17.82 %
Brady Statistic RV Percent Paced: 99.83 %
Date Time Interrogation Session: 20210609081925
Implantable Lead Implant Date: 20070319
Implantable Lead Implant Date: 20070319
Implantable Lead Location: 753859
Implantable Lead Location: 753860
Implantable Lead Model: 5076
Implantable Lead Model: 5076
Implantable Pulse Generator Implant Date: 20160210
Lead Channel Impedance Value: 285 Ohm
Lead Channel Impedance Value: 399 Ohm
Lead Channel Impedance Value: 418 Ohm
Lead Channel Impedance Value: 475 Ohm
Lead Channel Pacing Threshold Amplitude: 0.75 V
Lead Channel Pacing Threshold Amplitude: 0.75 V
Lead Channel Pacing Threshold Pulse Width: 0.4 ms
Lead Channel Pacing Threshold Pulse Width: 0.4 ms
Lead Channel Sensing Intrinsic Amplitude: 3.875 mV
Lead Channel Sensing Intrinsic Amplitude: 3.875 mV
Lead Channel Sensing Intrinsic Amplitude: 4 mV
Lead Channel Sensing Intrinsic Amplitude: 4 mV
Lead Channel Setting Pacing Amplitude: 2 V
Lead Channel Setting Pacing Amplitude: 2.5 V
Lead Channel Setting Pacing Pulse Width: 0.4 ms
Lead Channel Setting Sensing Sensitivity: 2 mV

## 2019-11-11 NOTE — Progress Notes (Signed)
Remote pacemaker transmission.   

## 2020-01-06 ENCOUNTER — Ambulatory Visit: Payer: Medicare PPO | Admitting: Family Medicine

## 2020-01-17 ENCOUNTER — Encounter: Payer: Self-pay | Admitting: Family Medicine

## 2020-01-17 ENCOUNTER — Other Ambulatory Visit: Payer: Self-pay

## 2020-01-17 ENCOUNTER — Ambulatory Visit: Payer: Medicare PPO | Admitting: Family Medicine

## 2020-01-17 VITALS — BP 124/80 | HR 72 | Temp 98.8°F | Wt 163.8 lb

## 2020-01-17 DIAGNOSIS — I498 Other specified cardiac arrhythmias: Secondary | ICD-10-CM

## 2020-01-17 DIAGNOSIS — F3342 Major depressive disorder, recurrent, in full remission: Secondary | ICD-10-CM

## 2020-01-17 DIAGNOSIS — E663 Overweight: Secondary | ICD-10-CM | POA: Diagnosis not present

## 2020-01-17 DIAGNOSIS — G90A Postural orthostatic tachycardia syndrome (POTS): Secondary | ICD-10-CM

## 2020-01-17 DIAGNOSIS — F339 Major depressive disorder, recurrent, unspecified: Secondary | ICD-10-CM

## 2020-01-17 MED ORDER — BUPROPION HCL ER (SR) 150 MG PO TB12: ORAL_TABLET | ORAL | 3 refills | Status: DC

## 2020-01-17 MED ORDER — MELOXICAM 15 MG PO TABS: 15.0000 mg | ORAL_TABLET | Freq: Every day | ORAL | 1 refills | Status: DC

## 2020-01-17 MED ORDER — METOPROLOL TARTRATE 50 MG PO TABS: 50.0000 mg | ORAL_TABLET | Freq: Every day | ORAL | 1 refills | Status: DC

## 2020-01-17 MED ORDER — VENLAFAXINE HCL ER 150 MG PO CP24: 150.0000 mg | ORAL_CAPSULE | Freq: Every day | ORAL | 1 refills | Status: DC

## 2020-01-17 NOTE — Progress Notes (Signed)
BP 124/80 (BP Location: Left Arm, Patient Position: Sitting, Cuff Size: Normal)   Pulse 72   Temp 98.8 F (37.1 C) (Oral)   Wt 163 lb 12.8 oz (74.3 kg)   SpO2 100%   BMI 28.12 kg/m    Subjective:    Patient ID: Angela [name], female    DOB: 11-12-1970, 49 y.o.   MRN: [id number]  HPI: Angela [name] is a 49 y.o. female  Chief Complaint  Patient presents with  . Depression  . Weight Loss   DEPRESSION Mood status: stable Satisfied with current treatment?: yes Symptom severity: mild  Duration of current treatment : chronic Side effects: no Medication compliance: excellent compliance Psychotherapy/counseling: no  Previous psychiatric medications: effexor Depressed mood: yes Anxious mood: yes Anhedonia: no Significant weight loss or gain: no Insomnia: no  Fatigue: no Feelings of worthlessness or guilt: no Impaired concentration/indecisiveness: no Suicidal ideations: no Hopelessness: no Crying spells: no Depression screen [facility] 2/9 01/17/2020 07/09/2019 07/08/2019 03/18/2018 01/23/2018  Decreased Interest 1 1 0 0 1  Down, Depressed, Hopeless 0 1 0 0 0  PHQ - 2 Score 1 2 0 0 1  Altered sleeping 2 2 - 1 -  Tired, decreased energy 1 1 - 1 -  Change in appetite 1 1 - 0 -  Feeling bad or failure about yourself  0 0 - 0 -  Trouble concentrating 2 1 - 1 -  Moving slowly or fidgety/restless 0 1 - 0 -  Suicidal thoughts 0 0 - 0 -  PHQ-9 Score 7 8 - 3 -  Difficult doing work/chores Somewhat difficult Somewhat difficult - - -  Some recent data might be hidden   WEIGHT GAIN Duration: chronic Previous attempts at weight loss: yes Complications of obesity: none Peak weight: 204 Weight loss goal: 150  Weight loss to date: 41lbs Requesting obesity pharmacotherapy: no Current weight loss supplements/medications: no Previous weight loss supplements/meds: no   Relevant past medical, surgical, family and social history reviewed and updated as indicated. Interim medical  history since our last visit reviewed. Allergies and medications reviewed and updated.  Review of Systems  Constitutional: Negative.   Respiratory: Negative.   Cardiovascular: Negative.   Gastrointestinal: Negative.   Musculoskeletal: Negative.   Neurological: Negative.   Psychiatric/Behavioral: Negative.     Per HPI unless specifically indicated above     Objective:    BP 124/80 (BP Location: Left Arm, Patient Position: Sitting, Cuff Size: Normal)   Pulse 72   Temp 98.8 F (37.1 C) (Oral)   Wt 163 lb 12.8 oz (74.3 kg)   SpO2 100%   BMI 28.12 kg/m   Wt Readings from Last 3 Encounters:  01/17/20 163 lb 12.8 oz (74.3 kg)  09/07/19 158 lb (71.7 kg)  08/16/19 160 lb (72.6 kg)    Physical Exam Vitals and nursing note reviewed.  Constitutional:      General: She is not in acute distress.    Appearance: Normal appearance. She is not ill-appearing, toxic-appearing or diaphoretic.  HENT:     Head: Normocephalic and atraumatic.     Right Ear: External ear normal.     Left Ear: External ear normal.     Nose: Nose normal.     Mouth/Throat:     Mouth: Mucous membranes are moist.     Pharynx: Oropharynx is clear.  Eyes:     General: No scleral icterus.       Right eye: No discharge.  Left eye: No discharge.     Extraocular Movements: Extraocular movements intact.     Conjunctiva/sclera: Conjunctivae normal.     Pupils: Pupils are equal, round, and reactive to light.  Cardiovascular:     Rate and Rhythm: Normal rate and regular rhythm.     Pulses: Normal pulses.     Heart sounds: Normal heart sounds. No murmur heard.  No friction rub. No gallop.   Pulmonary:     Effort: Pulmonary effort is normal. No respiratory distress.     Breath sounds: Normal breath sounds. No stridor. No wheezing, rhonchi or rales.  Chest:     Chest wall: No tenderness.  Musculoskeletal:        General: Normal range of motion.     Cervical back: Normal range of motion and neck supple.    Skin:    General: Skin is warm and dry.     Capillary Refill: Capillary refill takes less than 2 seconds.     Coloration: Skin is not jaundiced or pale.     Findings: No bruising, erythema, lesion or rash.  Neurological:     General: No focal deficit present.     Mental Status: She is alert and oriented to person, place, and time. Mental status is at baseline.  Psychiatric:        Mood and Affect: Mood normal.        Behavior: Behavior normal.        Thought Content: Thought content normal.        Judgment: Judgment normal.     Results for orders placed or performed in visit on 11/10/19  CUP PACEART REMOTE DEVICE CHECK  Result Value Ref Range   Date Time Interrogation Session [id number]    Pulse Generator Manufacturer MERM    Pulse Gen Model A2DR01 Advisa DR MRI    Pulse Gen Serial Number [id number]    Clinic Name [facility]    Implantable Pulse Generator Type Implantable Pulse Generator    Implantable Pulse Generator Implant Date [id number]    Implantable Lead Manufacturer MERM    Implantable Lead Model 5076 CapSureFix Novus    Implantable Lead Serial Number [id number]    Implantable Lead Implant Date [id number]    Implantable Lead Location Detail 1 APPENDAGE    Implantable Lead Location [id number]    Implantable Lead Manufacturer MERM    Implantable Lead Model 5076 CapSureFix Novus    Implantable Lead Serial Number [id number]    Implantable Lead Implant Date [id number]    Implantable Lead Location Detail 1 APEX    Implantable Lead Location [phone]    Lead Channel Setting Sensing Sensitivity 2 mV   Lead Channel Setting Pacing Amplitude 2 V   Lead Channel Setting Pacing Pulse Width 0.4 ms   Lead Channel Setting Pacing Amplitude 2.5 V   Lead Channel Impedance Value 418 ohm   Lead Channel Impedance Value 285 ohm   Lead Channel Sensing Intrinsic Amplitude 4 mV   Lead Channel Sensing Intrinsic Amplitude 4 mV   Lead Channel Pacing Threshold Amplitude 0.75 V   Lead Channel  Pacing Threshold Pulse Width 0.4 ms   Lead Channel Impedance Value 475 ohm   Lead Channel Impedance Value 399 ohm   Lead Channel Sensing Intrinsic Amplitude 3.875 mV   Lead Channel Sensing Intrinsic Amplitude 3.875 mV   Lead Channel Pacing Threshold Amplitude 0.75 V   Lead Channel Pacing Threshold Pulse Width 0.4 ms   Battery Status OK    Battery Remaining  Longevity 40 mo   Battery Voltage 2.98 V   Brady Statistic RA Percent Paced 17.82 %   Brady Statistic RV Percent Paced 99.83 %   Brady Statistic AP VP Percent 17.79 %   Brady Statistic AS VP Percent 82.1 %   Brady Statistic AP VS Percent 0.07 %   Brady Statistic AS VS Percent 0.04 %      Assessment & Plan:   Problem List Items Addressed This Visit      Cardiovascular and Mediastinum   POTS (postural orthostatic tachycardia syndrome)    Under good control on current regimen. Continue current regimen. Continue to monitor. Call with any concerns. Refills given.        Relevant Medications   metoprolol tartrate (LOPRESSOR) 50 MG tablet     Other   Depression, recurrent ([place]) - Primary    Under good control on current regimen. Continue current regimen. Continue to monitor. Call with any concerns. Refills given.        Relevant Medications   venlafaxine XR (EFFEXOR-XR) 150 MG 24 hr capsule   buPROPion (WELLBUTRIN SR) 150 MG 12 hr tablet    Other Visit Diagnoses    Overweight       Continue diet and exercise with goal of losing 1-2lbs per week. Start wellbutrin. Call with any concerns.    Recurrent major depressive disorder, in full remission (HCC)       Relevant Medications   venlafaxine XR (EFFEXOR-XR) 150 MG 24 hr capsule   buPROPion (WELLBUTRIN SR) 150 MG 12 hr tablet       Follow up plan: Return in about 4 weeks (around 02/14/2020) for follow up weight/wellbutrin.

## 2020-01-17 NOTE — Assessment & Plan Note (Signed)
Under good control on current regimen. Continue current regimen. Continue to monitor. Call with any concerns. Refills given.   

## 2020-02-09 ENCOUNTER — Ambulatory Visit (INDEPENDENT_AMBULATORY_CARE_PROVIDER_SITE_OTHER): Payer: Medicare PPO | Admitting: *Deleted

## 2020-02-09 DIAGNOSIS — I442 Atrioventricular block, complete: Secondary | ICD-10-CM | POA: Diagnosis not present

## 2020-02-09 LAB — CUP PACEART REMOTE DEVICE CHECK
Battery Remaining Longevity: 42 mo
Battery Voltage: 2.97 V
Brady Statistic AP VP Percent: 18.11 %
Brady Statistic AP VS Percent: 0.03 %
Brady Statistic AS VP Percent: 81.77 %
Brady Statistic AS VS Percent: 0.09 %
Brady Statistic RA Percent Paced: 18.12 %
Brady Statistic RV Percent Paced: 99.87 %
Date Time Interrogation Session: 20210908090538
Implantable Lead Implant Date: 20070319
Implantable Lead Implant Date: 20070319
Implantable Lead Location: 753859
Implantable Lead Location: 753860
Implantable Lead Model: 5076
Implantable Lead Model: 5076
Implantable Pulse Generator Implant Date: 20160210
Lead Channel Impedance Value: 285 Ohm
Lead Channel Impedance Value: 418 Ohm
Lead Channel Impedance Value: 418 Ohm
Lead Channel Impedance Value: 475 Ohm
Lead Channel Pacing Threshold Amplitude: 0.75 V
Lead Channel Pacing Threshold Amplitude: 0.875 V
Lead Channel Pacing Threshold Pulse Width: 0.4 ms
Lead Channel Pacing Threshold Pulse Width: 0.4 ms
Lead Channel Sensing Intrinsic Amplitude: 3.75 mV
Lead Channel Sensing Intrinsic Amplitude: 3.75 mV
Lead Channel Sensing Intrinsic Amplitude: 4.5 mV
Lead Channel Sensing Intrinsic Amplitude: 4.5 mV
Lead Channel Setting Pacing Amplitude: 2 V
Lead Channel Setting Pacing Amplitude: 2.5 V
Lead Channel Setting Pacing Pulse Width: 0.4 ms
Lead Channel Setting Sensing Sensitivity: 2 mV

## 2020-02-10 NOTE — Progress Notes (Signed)
Remote pacemaker transmission.   

## 2020-02-14 ENCOUNTER — Encounter: Payer: Self-pay | Admitting: Family Medicine

## 2020-02-14 ENCOUNTER — Ambulatory Visit: Payer: Medicare PPO | Admitting: Family Medicine

## 2020-02-14 ENCOUNTER — Other Ambulatory Visit: Payer: Self-pay

## 2020-02-14 VITALS — BP 115/78 | HR 85 | Temp 97.8°F | Wt 161.0 lb

## 2020-02-14 DIAGNOSIS — E663 Overweight: Secondary | ICD-10-CM

## 2020-02-14 DIAGNOSIS — F339 Major depressive disorder, recurrent, unspecified: Secondary | ICD-10-CM | POA: Diagnosis not present

## 2020-02-14 DIAGNOSIS — Z23 Encounter for immunization: Secondary | ICD-10-CM

## 2020-02-14 MED ORDER — BUPROPION HCL ER (SR) 150 MG PO TB12
150.0000 mg | ORAL_TABLET | Freq: Two times a day (BID) | ORAL | 1 refills | Status: DC
Start: 2020-02-14 — End: 2020-07-10

## 2020-02-14 NOTE — Assessment & Plan Note (Signed)
Doing well on the wellbutrin. Tolerating it well. Continue current regimen. Continue to monitor. Call with any concerns.

## 2020-02-14 NOTE — Progress Notes (Signed)
BP 115/78   Pulse 85   Temp 97.8 F (36.6 C) (Oral)   Wt 161 lb (73 kg)   SpO2 97%   BMI 27.64 kg/m    Subjective:    Patient ID: Angela [name], female    DOB: August 25, 1970, 49 y.o.   MRN: [id number]  HPI: Angela [name] is a 49 y.o. female  Chief Complaint  Patient presents with  . Depression  . Weight Check   Has been really tired for a couple of days. Has had her heart racing and skipping beats. Has been having a lot of stress. Feels like she's just run down. She has reached out to her cardiologist, but has not heard from him yet.   DEPRESSION Mood status: better Satisfied with current treatment?: yes Symptom severity: mild  Duration of current treatment : chronic Side effects: no Medication compliance: excellent compliance Psychotherapy/counseling: no  Previous psychiatric medications: effexor, wellbutrin Depressed mood: yes Anxious mood: yes Anhedonia: no Significant weight loss or gain: no Insomnia: no  Fatigue: yes Feelings of worthlessness or guilt: no Impaired concentration/indecisiveness: no Suicidal ideations: no Hopelessness: no Crying spells: no Depression screen [facility] 2/9 01/17/2020 07/09/2019 07/08/2019 03/18/2018 01/23/2018  Decreased Interest 1 1 0 0 1  Down, Depressed, Hopeless 0 1 0 0 0  PHQ - 2 Score 1 2 0 0 1  Altered sleeping 2 2 - 1 -  Tired, decreased energy 1 1 - 1 -  Change in appetite 1 1 - 0 -  Feeling bad or failure about yourself  0 0 - 0 -  Trouble concentrating 2 1 - 1 -  Moving slowly or fidgety/restless 0 1 - 0 -  Suicidal thoughts 0 0 - 0 -  PHQ-9 Score 7 8 - 3 -  Difficult doing work/chores Somewhat difficult Somewhat difficult - - -  Some recent data might be hidden   Feeling like the wellbutrin is helping with sleep and is helping with her snacking and her mood.   Relevant past medical, surgical, family and social history reviewed and updated as indicated. Interim medical history since our last visit  reviewed. Allergies and medications reviewed and updated.  Review of Systems  Constitutional: Negative.   Respiratory: Positive for shortness of breath. Negative for apnea, cough, choking, chest tightness, wheezing and stridor.   Cardiovascular: Positive for palpitations. Negative for chest pain and leg swelling.  Gastrointestinal: Negative.   Musculoskeletal: Negative.   Skin: Negative.   Neurological: Negative.   Psychiatric/Behavioral: Negative for agitation, behavioral problems, confusion, decreased concentration, dysphoric mood, hallucinations, self-injury, sleep disturbance and suicidal ideas. The patient is nervous/anxious. The patient is not hyperactive.     Per HPI unless specifically indicated above     Objective:    BP 115/78   Pulse 85   Temp 97.8 F (36.6 C) (Oral)   Wt 161 lb (73 kg)   SpO2 97%   BMI 27.64 kg/m   Wt Readings from Last 3 Encounters:  02/14/20 161 lb (73 kg)  01/17/20 163 lb 12.8 oz (74.3 kg)  09/07/19 158 lb (71.7 kg)    Physical Exam Vitals and nursing note reviewed.  Constitutional:      General: She is not in acute distress.    Appearance: Normal appearance. She is not ill-appearing, toxic-appearing or diaphoretic.  HENT:     Head: Normocephalic and atraumatic.     Right Ear: External ear normal.     Left Ear: External ear normal.  Nose: Nose normal.     Mouth/Throat:     Mouth: Mucous membranes are moist.     Pharynx: Oropharynx is clear.  Eyes:     General: No scleral icterus.       Right eye: No discharge.        Left eye: No discharge.     Extraocular Movements: Extraocular movements intact.     Conjunctiva/sclera: Conjunctivae normal.     Pupils: Pupils are equal, round, and reactive to light.  Cardiovascular:     Rate and Rhythm: Normal rate and regular rhythm.     Pulses: Normal pulses.     Heart sounds: Normal heart sounds. No murmur heard.  No friction rub. No gallop.   Pulmonary:     Effort: Pulmonary effort is  normal. No respiratory distress.     Breath sounds: Normal breath sounds. No stridor. No wheezing, rhonchi or rales.  Chest:     Chest wall: No tenderness.  Musculoskeletal:        General: Normal range of motion.     Cervical back: Normal range of motion and neck supple.  Skin:    General: Skin is warm and dry.     Capillary Refill: Capillary refill takes less than 2 seconds.     Coloration: Skin is not jaundiced or pale.     Findings: No bruising, erythema, lesion or rash.  Neurological:     General: No focal deficit present.     Mental Status: She is alert and oriented to person, place, and time. Mental status is at baseline.  Psychiatric:        Mood and Affect: Mood normal.        Behavior: Behavior normal.        Thought Content: Thought content normal.        Judgment: Judgment normal.     Results for orders placed or performed in visit on 02/09/20  CUP PACEART REMOTE DEVICE CHECK  Result Value Ref Range   Date Time Interrogation Session [id number]    Pulse Generator Manufacturer MERM    Pulse Gen Model A2DR01 Advisa DR MRI    Pulse Gen Serial Number [id number]    Clinic Name [facility]    Implantable Pulse Generator Type Implantable Pulse Generator    Implantable Pulse Generator Implant Date [id number]    Implantable Lead Manufacturer MERM    Implantable Lead Model 5076 CapSureFix Novus    Implantable Lead Serial Number [id number]    Implantable Lead Implant Date [id number]    Implantable Lead Location Detail 1 APPENDAGE    Implantable Lead Location [id number]    Implantable Lead Manufacturer MERM    Implantable Lead Model 5076 CapSureFix Novus    Implantable Lead Serial Number [id number]    Implantable Lead Implant Date [id number]    Implantable Lead Location Detail 1 APEX    Implantable Lead Location [id number]    Lead Channel Setting Sensing Sensitivity 2 mV   Lead Channel Setting Pacing Amplitude 2 V   Lead Channel Setting Pacing Pulse Width 0.4 ms   Lead Channel  Setting Pacing Amplitude 2.5 V   Lead Channel Impedance Value 418 ohm   Lead Channel Impedance Value 285 ohm   Lead Channel Sensing Intrinsic Amplitude 3.75 mV   Lead Channel Sensing Intrinsic Amplitude 3.75 mV   Lead Channel Pacing Threshold Amplitude 0.875 V   Lead Channel Pacing Threshold Pulse Width 0.4 ms   Lead Channel Impedance Value 475 ohm   Lead Channel  Impedance Value 418 ohm   Lead Channel Sensing Intrinsic Amplitude 4.5 mV   Lead Channel Sensing Intrinsic Amplitude 4.5 mV   Lead Channel Pacing Threshold Amplitude 0.75 V   Lead Channel Pacing Threshold Pulse Width 0.4 ms   Battery Status OK    Battery Remaining Longevity 42 mo   Battery Voltage 2.97 V   Brady Statistic RA Percent Paced 18.12 %   Brady Statistic RV Percent Paced 99.87 %   Brady Statistic AP VP Percent 18.11 %   Brady Statistic AS VP Percent 81.77 %   Brady Statistic AP VS Percent 0.03 %   Brady Statistic AS VS Percent 0.09 %      Assessment & Plan:   Problem List Items Addressed This Visit      Other   Depression, recurrent ([place]) - Primary    Doing well on the wellbutrin. Tolerating it well. Continue current regimen. Continue to monitor. Call with any concerns.      Relevant Medications   buPROPion (WELLBUTRIN SR) 150 MG 12 hr tablet    Other Visit Diagnoses    Overweight       Down 3lbs since last visit. Continue wellbutrin. Continue to monitor. Call with any concerns.    Flu vaccine need       Flu shot given today.   Relevant Orders   Flu Vaccine QUAD 36+ mos IM       Follow up plan: Return in about 6 months (around 08/13/2020).

## 2020-02-15 ENCOUNTER — Telehealth: Payer: Self-pay

## 2020-02-15 NOTE — Progress Notes (Signed)
Electrophysiology Office Note Date: 02/16/2020  ID:  Angela, [name] Dec 16, 1970, MRN [id number]  PCP: Valerie Roys, DO Electrophysiologist: Caryl Comes  CC: Pacemaker follow-up  JANEESE [name] is a 49 y.o. female seen today for Dr Caryl Comes.  She presents today for routine electrophysiology followup.  Since last being seen in our clinic, the patient reports doing relatively well.  Her daughter is at [employer] and her son is graduating from college this year.  She has had fatigue for the last 3 days that is unexplained. She has had no other significant symptoms. She remains very active.  She is fully vaccinated and received her flu shot on Sunday.   She denies chest pain, palpitations, dyspnea, PND, orthopnea, nausea, vomiting, dizziness, syncope, edema, weight gain, or early satiety.  Device History: MDT dual chamber PPM implanted 2007 for complete heart block; gen change 2016   Past Medical History:  Diagnosis Date  . Chest pain    a cath 2/10. EF 50-55% normal coronaries  . Complete heart block ([place])   . Depression   . Dysautonomia ([place])   . Dyspnea    CT negative for PE, 2007  . Hypertension   . Insomnia   . OCD (obsessive compulsive disorder)   . SVT (supraventricular tachycardia) (HCC)    s/p ablation a. c/b AV nod ablation requiring pacemaker   Past Surgical History:  Procedure Laterality Date  . BREAST BIOPSY Right 1998  . BREAST BIOPSY Right 2004  . BREAST LUMPECTOMY     RIGHT -BREAST   . COLONOSCOPY WITH PROPOFOL N/A 09/07/2019   Procedure: COLONOSCOPY WITH PROPOFOL;  Surgeon: Lucilla Lame, MD;  Location: [facility] ENDOSCOPY;  Service: Endoscopy;  Laterality: N/A;  . ENDOMETRIAL ABLATION  2005  . INSERT / REPLACE / REMOVE PACEMAKER    . PELVIC LAPAROSCOPY  1990  . PERMANENT PACEMAKER GENERATOR CHANGE N/A 07/13/2014   MDT MRI compatible dual chamber pacemaker implanted by Dr Caryl Comes  . SPARK  2005  . TUBAL LIGATION  2003    Current Outpatient Medications  Medication  Sig Dispense Refill  . buPROPion (WELLBUTRIN SR) 150 MG 12 hr tablet Take 1 tablet (150 mg total) by mouth 2 (two) times daily. 180 tablet 1  . Cyanocobalamin (VITAMIN B 12) 500 MCG TABS Take 1,000 mcg by mouth daily.    Marland Kitchen linaclotide (LINZESS) 145 MCG CAPS capsule Take 1 capsule (145 mcg total) by mouth daily before breakfast. 30 capsule 5  . linaclotide (LINZESS) 72 MCG capsule Take 1 capsule (72 mcg total) by mouth daily. 90 capsule 2  . LORazepam (ATIVAN) 0.5 MG tablet TAKE 1 TABLET(0.5 MG) BY MOUTH DAILY AS NEEDED FOR ANXIETY 30 tablet 0  . meloxicam (MOBIC) 15 MG tablet Take 1 tablet (15 mg total) by mouth daily. 90 tablet 1  . metoprolol tartrate (LOPRESSOR) 50 MG tablet Take 1 tablet (50 mg total) by mouth daily. 90 tablet 1  . Multiple Vitamins-Minerals (MULTIVITAMINS THER. W/MINERALS) TABS Take 1 tablet by mouth daily.    Marland Kitchen venlafaxine XR (EFFEXOR-XR) 150 MG 24 hr capsule Take 1 capsule (150 mg total) by mouth daily with breakfast. 90 capsule 1   No current facility-administered medications for this visit.    Allergies:   Toprol xl [metoprolol succinate]   Social History: Social History   Socioeconomic History  . Marital status: Married    Spouse name: Not on file  . Number of children: Not on file  . Years of education: Not on file  .  Highest education level: Bachelor's degree (e.g., BA, AB, BS)  Occupational History  . Occupation: disability   Tobacco Use  . Smoking status: Never Smoker  . Smokeless tobacco: Never Used  Vaping Use  . Vaping Use: Never used  Substance and Sexual Activity  . Alcohol use: No  . Drug use: No  . Sexual activity: Yes    Birth control/protection: Surgical  Other Topics Concern  . Not on file  Social History Narrative   Work with daughters school    Social Determinants of Health   Financial Resource Strain:   . Difficulty of Paying Living Expenses: Not on file  Food Insecurity:   . Worried About Charity fundraiser in the Last Year:  Not on file  . Ran Out of Food in the Last Year: Not on file  Transportation Needs:   . Lack of Transportation (Medical): Not on file  . Lack of Transportation (Non-Medical): Not on file  Physical Activity:   . Days of Exercise per Week: Not on file  . Minutes of Exercise per Session: Not on file  Stress:   . Feeling of Stress : Not on file  Social Connections:   . Frequency of Communication with Friends and Family: Not on file  . Frequency of Social Gatherings with Friends and Family: Not on file  . Attends Religious Services: Not on file  . Active Member of Clubs or Organizations: Not on file  . Attends Archivist Meetings: Not on file  . Marital Status: Not on file  Intimate Partner Violence:   . Fear of Current or Ex-Partner: Not on file  . Emotionally Abused: Not on file  . Physically Abused: Not on file  . Sexually Abused: Not on file    Family History: Family History  Problem Relation Age of Onset  . Hypertension Father   . Cancer Father 69       COLON  . Diabetes Father   . Breast cancer Paternal Aunt   . Hypertension Maternal Grandfather   . Diabetes Paternal Grandmother   . Breast cancer Paternal Grandmother   . Diabetes Paternal Grandfather   . Breast cancer Maternal Aunt        mastectomy  . Dementia Maternal Grandmother   . Breast cancer Paternal Aunt      Review of Systems: All other systems reviewed and are otherwise negative except as noted above.   Physical Exam: VS:  BP 110/82   Pulse 66   Ht 5\' 4"  (1.626 m)   Wt 162 lb 6.4 oz (73.7 kg)   SpO2 99%   BMI 27.88 kg/m  , BMI Body mass index is 27.88 kg/m.  GEN- The patient is well appearing, alert and oriented x 3 today.   HEENT: normocephalic, atraumatic; sclera clear, conjunctiva pink; hearing intact; oropharynx clear; neck supple  Lungs- Clear to ausculation bilaterally, normal work of breathing.  No wheezes, rales, rhonchi Heart- Regular rate and rhythm (paced) GI- soft,  non-tender, non-distended, bowel sounds present  Extremities- no clubbing, cyanosis, or edema  MS- no significant deformity or atrophy Skin- warm and dry, no rash or lesion; PPM pocket well healed Psych- euthymic mood, full affect Neuro- strength and sensation are intact  PPM Interrogation- reviewed in detail today,  See PACEART report  EKG:  EKG is not ordered today.  Recent Labs: 03/19/2019: ALT 18; BUN 17; Creatinine, Ser 0.83; Hemoglobin 14.4; Platelets 298; Potassium 4.7; Sodium 141; TSH 1.160   Wt Readings from Last  3 Encounters:  02/16/20 162 lb 6.4 oz (73.7 kg)  02/14/20 161 lb (73 kg)  01/17/20 163 lb 12.8 oz (74.3 kg)      Assessment and Plan:  1.  Complete heart block Normal PPM function See Pace Art report No changes today She is not device dependent today   2.  POTS Stable No change required today  3.  Atrial tachycardia Burden by device interrogation <1% Will continue to monitor   4.  Fatigue Unexplained Will update labs and echo with chronic RV pacing   Current medicines are reviewed at length with the patient today.   The patient does not have concerns regarding her medicines.  The following changes were made today:  none  Labs/ tests ordered today include: none Orders Placed This Encounter  Procedures  . CBC  . Basic metabolic panel  . Magnesium  . TSH  . EKG 12-Lead  . ECHOCARDIOGRAM COMPLETE     Disposition:   Follow up with [facility], 1 year with me or Dr Caryl Comes    Signed, Chanetta Marshall, NP 02/16/2020 10:35 AM  [place] [address] [phone] (office) [phone] (fax)

## 2020-02-15 NOTE — Telephone Encounter (Signed)
Patient called stating that she feels bad. She says that she has been feeling really tired and sob, she cant catch her breath. She did go see her pcp and everything checked out normal but they told her if she is still feeling bad to give [place] a call. She is sending a transmission now when it is received can you please call patient to reassure her if everything is alright

## 2020-02-15 NOTE — Telephone Encounter (Signed)
Transmission received, reviewed.  No issues/ changes noted.  Device is functioning appropriately.  Pt overdue for in clinic appt.   Spoke with pt, assured her device is functioning appropriately. Pt indicates she has been having these symptoms and feels it might be due to stress.  Advised I would have scheduling contact her to schedule regular office visit.

## 2020-02-16 ENCOUNTER — Encounter: Payer: Self-pay | Admitting: Nurse Practitioner

## 2020-02-16 ENCOUNTER — Ambulatory Visit: Payer: Medicare PPO | Admitting: Nurse Practitioner

## 2020-02-16 ENCOUNTER — Other Ambulatory Visit: Payer: Self-pay

## 2020-02-16 VITALS — BP 110/82 | HR 66 | Ht 64.0 in | Wt 162.4 lb

## 2020-02-16 DIAGNOSIS — G901 Familial dysautonomia [Riley-Day]: Secondary | ICD-10-CM

## 2020-02-16 DIAGNOSIS — I442 Atrioventricular block, complete: Secondary | ICD-10-CM | POA: Diagnosis not present

## 2020-02-16 DIAGNOSIS — R5383 Other fatigue: Secondary | ICD-10-CM

## 2020-02-16 DIAGNOSIS — I471 Supraventricular tachycardia: Secondary | ICD-10-CM

## 2020-02-16 LAB — BASIC METABOLIC PANEL
BUN/Creatinine Ratio: 21 (ref 9–23)
BUN: 18 mg/dL (ref 6–24)
CO2: 26 mmol/L (ref 20–29)
Calcium: 9.8 mg/dL (ref 8.7–10.2)
Chloride: 99 mmol/L (ref 96–106)
Creatinine, Ser: 0.84 mg/dL (ref 0.57–1.00)
GFR calc Af Amer: 95 mL/min/{1.73_m2} (ref >59)
GFR calc non Af Amer: 82 mL/min/{1.73_m2} (ref >59)
Glucose: 83 mg/dL (ref 65–99)
Potassium: 4.5 mmol/L (ref 3.5–5.2)
Sodium: 136 mmol/L (ref 134–144)

## 2020-02-16 LAB — CBC
Hematocrit: 43.3 % (ref 34.0–46.6)
Hemoglobin: 14.8 g/dL (ref 11.1–15.9)
MCH: 32.2 pg (ref 26.6–33.0)
MCHC: 34.2 g/dL (ref 31.5–35.7)
MCV: 94 fL (ref 79–97)
Platelets: 272 10*3/uL (ref 150–450)
RBC: 4.6 x10E6/uL (ref 3.77–5.28)
RDW: 11.8 % (ref 11.7–15.4)
WBC: 8.3 10*3/uL (ref 3.4–10.8)

## 2020-02-16 LAB — TSH: TSH: 1.08 u[IU]/mL (ref 0.450–4.500)

## 2020-02-16 LAB — MAGNESIUM: Magnesium: 1.8 mg/dL (ref 1.6–2.3)

## 2020-02-16 NOTE — Patient Instructions (Signed)
Medication Instructions:  No changes *If you need a refill on your cardiac medications before your next appointment, please call your pharmacy*   Lab Work: Today: cbc, tsh, bmet, magnesium If you have labs (blood work) drawn today and your tests are completely normal, you will receive your results only by: Marland Kitchen MyChart Message (if you have MyChart) OR . A paper copy in the mail If you have any lab test that is abnormal or we need to change your treatment, we will call you to review the results.   Testing/Procedures: Your physician has requested that you have an echocardiogram. Echocardiography is a painless test that uses sound waves to create images of your heart. It provides your doctor with information about the size and shape of your heart and how well your heart's chambers and valves are working. This procedure takes approximately one hour. There are no restrictions for this procedure.    Follow-Up: At [facility], you and your health needs are our priority.  As part of our continuing mission to provide you with exceptional heart care, we have created designated Provider Care Teams.  These Care Teams include your primary Cardiologist (physician) and Advanced Practice Providers (APPs -  Physician Assistants and Nurse Practitioners) who all work together to provide you with the care you need, when you need it.  Your next appointment:   12 month(s)  The format for your next appointment:   In Person  Provider:   You may see Dr. Caryl Comes or the following Advanced Practice Provider on your designated Care Team:    Chanetta Marshall, NP    Other Instructions

## 2020-02-17 ENCOUNTER — Telehealth: Payer: Self-pay

## 2020-02-17 NOTE — Telephone Encounter (Signed)
-----   Message from Angela Berthold, NP sent at 02/17/2020  7:46 AM EDT ----- Please notify patient of stable labs. Thanks!

## 2020-02-17 NOTE — Telephone Encounter (Signed)
Left detailed message of results and recommendations on cellphone per DPR. Instructed patient to call of any questions or concerns

## 2020-03-03 ENCOUNTER — Other Ambulatory Visit: Payer: Self-pay

## 2020-03-03 ENCOUNTER — Ambulatory Visit (HOSPITAL_COMMUNITY): Payer: Medicare PPO | Attending: Cardiology

## 2020-03-03 DIAGNOSIS — G901 Familial dysautonomia [Riley-Day]: Secondary | ICD-10-CM | POA: Diagnosis not present

## 2020-03-03 DIAGNOSIS — I442 Atrioventricular block, complete: Secondary | ICD-10-CM | POA: Diagnosis not present

## 2020-03-03 DIAGNOSIS — I471 Supraventricular tachycardia: Secondary | ICD-10-CM | POA: Diagnosis not present

## 2020-03-03 DIAGNOSIS — R5383 Other fatigue: Secondary | ICD-10-CM | POA: Insufficient documentation

## 2020-03-03 LAB — ECHOCARDIOGRAM COMPLETE
Area-P 1/2: 3.53 cm2
S' Lateral: 3 cm

## 2020-03-13 NOTE — Telephone Encounter (Signed)
Angela [name] is calling back to follow up in regards to this due to never hearing anything back. Her best contact for this is [phone]. Please advise.

## 2020-03-17 NOTE — Telephone Encounter (Signed)
Called and discussed echo results with patient last week.  Chanetta Marshall, NP 03/17/2020 8:23 AM

## 2020-03-19 ENCOUNTER — Other Ambulatory Visit: Payer: Self-pay | Admitting: Gastroenterology

## 2020-03-19 ENCOUNTER — Other Ambulatory Visit: Payer: Self-pay | Admitting: Family Medicine

## 2020-03-24 ENCOUNTER — Other Ambulatory Visit: Payer: Self-pay | Admitting: Family Medicine

## 2020-03-24 ENCOUNTER — Ambulatory Visit: Payer: Medicare PPO | Admitting: Family Medicine

## 2020-03-24 ENCOUNTER — Encounter: Payer: Self-pay | Admitting: Family Medicine

## 2020-03-24 ENCOUNTER — Other Ambulatory Visit: Payer: Self-pay

## 2020-03-24 ENCOUNTER — Ambulatory Visit
Admission: RE | Admit: 2020-03-24 | Discharge: 2020-03-24 | Disposition: A | Discharge status: Home / Self Care | Payer: Medicare PPO | Source: Ambulatory Visit | Attending: Family Medicine | Admitting: Family Medicine

## 2020-03-24 VITALS — BP 156/95 | HR 64 | Temp 97.8°F | Ht 64.0 in | Wt 159.8 lb

## 2020-03-24 DIAGNOSIS — H814 Vertigo of central origin: Secondary | ICD-10-CM | POA: Diagnosis not present

## 2020-03-24 DIAGNOSIS — R0602 Shortness of breath: Secondary | ICD-10-CM | POA: Diagnosis not present

## 2020-03-24 DIAGNOSIS — M79674 Pain in right toe(s): Secondary | ICD-10-CM

## 2020-03-24 DIAGNOSIS — R110 Nausea: Secondary | ICD-10-CM | POA: Diagnosis not present

## 2020-03-24 DIAGNOSIS — R42 Dizziness and giddiness: Secondary | ICD-10-CM

## 2020-03-24 DIAGNOSIS — M5030 Other cervical disc degeneration, unspecified cervical region: Secondary | ICD-10-CM | POA: Diagnosis not present

## 2020-03-24 DIAGNOSIS — R29898 Other symptoms and signs involving the musculoskeletal system: Secondary | ICD-10-CM

## 2020-03-24 DIAGNOSIS — M47812 Spondylosis without myelopathy or radiculopathy, cervical region: Secondary | ICD-10-CM | POA: Diagnosis not present

## 2020-03-24 DIAGNOSIS — Z981 Arthrodesis status: Secondary | ICD-10-CM | POA: Diagnosis not present

## 2020-03-24 DIAGNOSIS — R29818 Other symptoms and signs involving the nervous system: Secondary | ICD-10-CM | POA: Insufficient documentation

## 2020-03-24 DIAGNOSIS — M4312 Spondylolisthesis, cervical region: Secondary | ICD-10-CM | POA: Diagnosis not present

## 2020-03-24 IMAGING — CT CT HEAD W/O CM
2 of 4 series · 12 of 47 positions shown, 15 images · non-contrast
Comparison: None.

CLINICAL DATA: Vertigo, right upper extremity weakness

EXAM:
CT HEAD WITHOUT CONTRAST
CT CERVICAL SPINE WITHOUT CONTRAST
TECHNIQUE: Multidetector CT imaging of the head and cervical spine was
performed following the standard protocol without intravenous
contrast. Multiplanar CT image reconstructions of the cervical spine
were also generated.

[Series 2: axial st head 5.00 ax · axial · 0.30mm/px · z∈[-511,-392]mm · 9 of 29 slices shown, 12 images]
[im 3/29  brain]
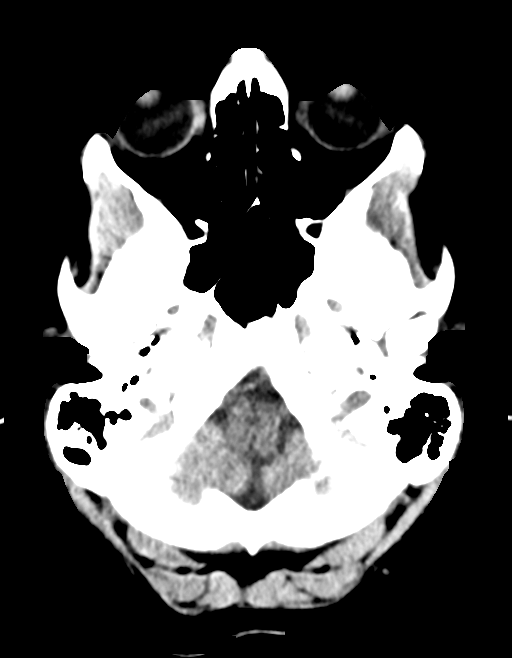
[im 3/29  bone]
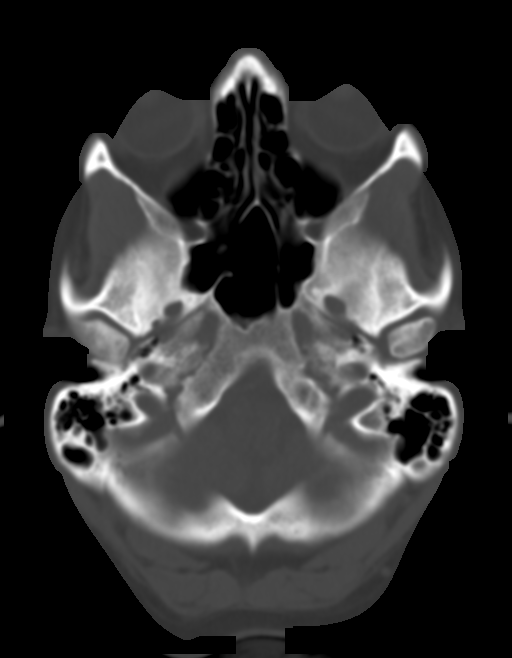
[im 7/29  brain]
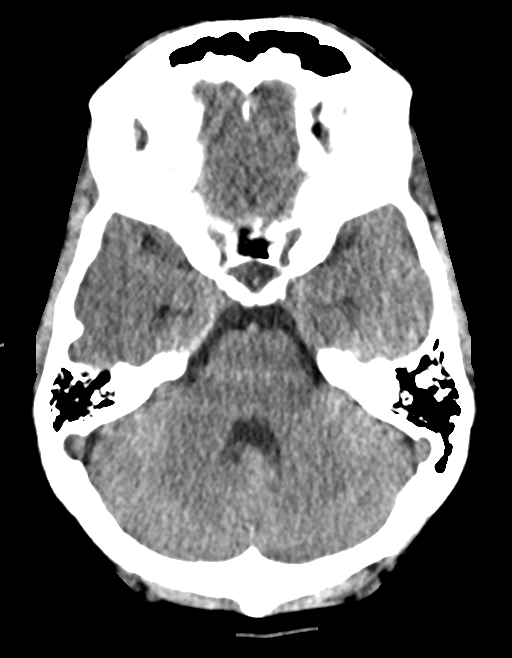
[im 9/29  brain]
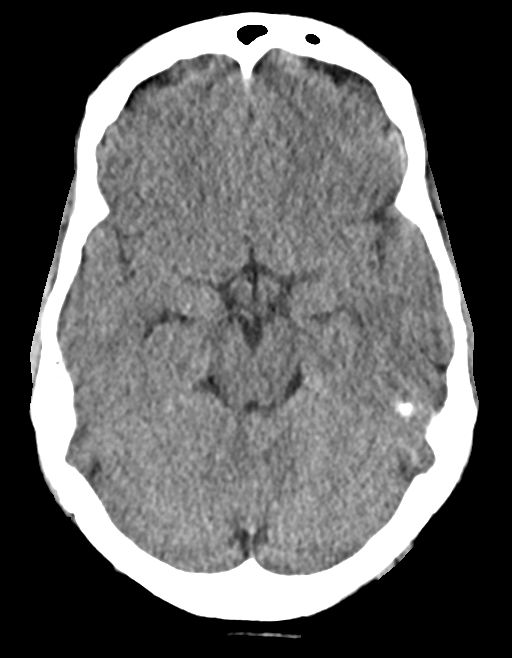
[im 13/29  brain]
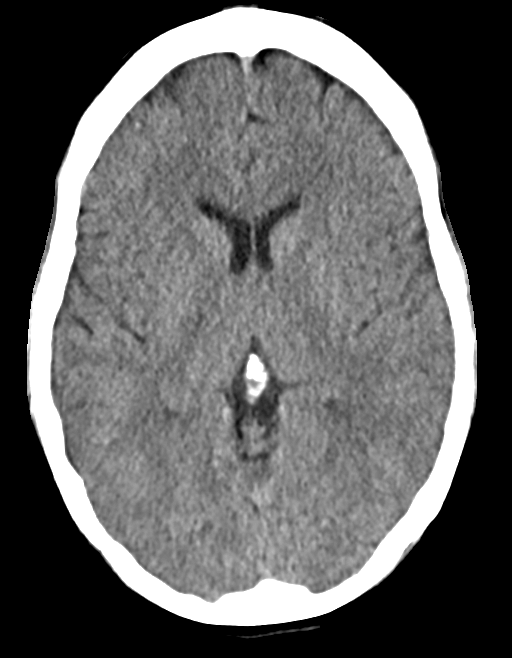
[im 15/29  brain]
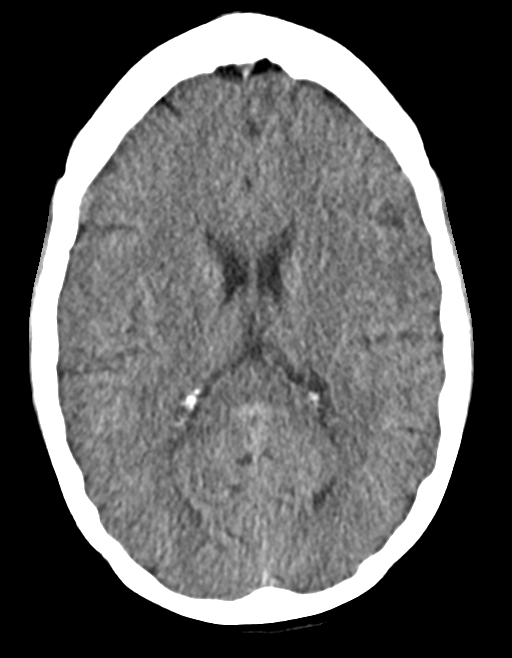
[im 15/29  bone]
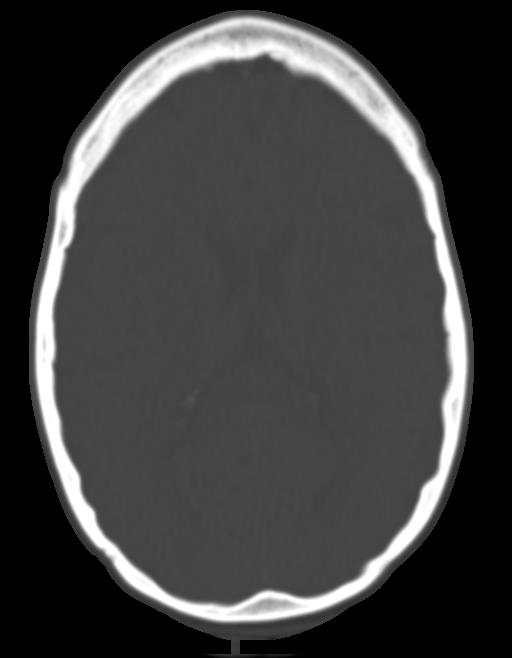
[im 17/29  brain]
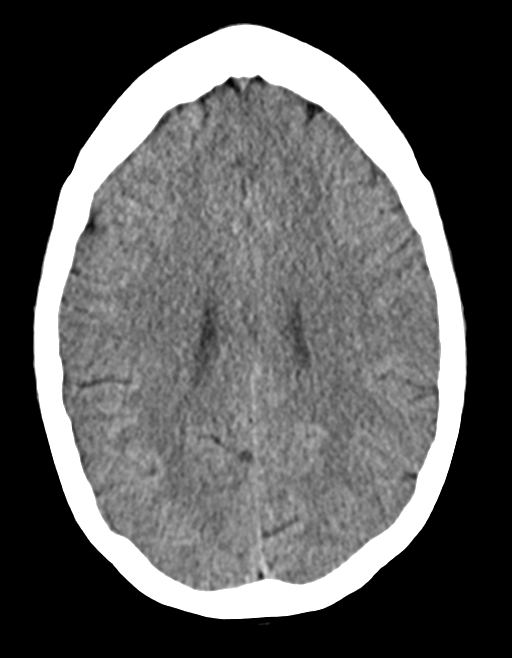
[im 21/29  brain]
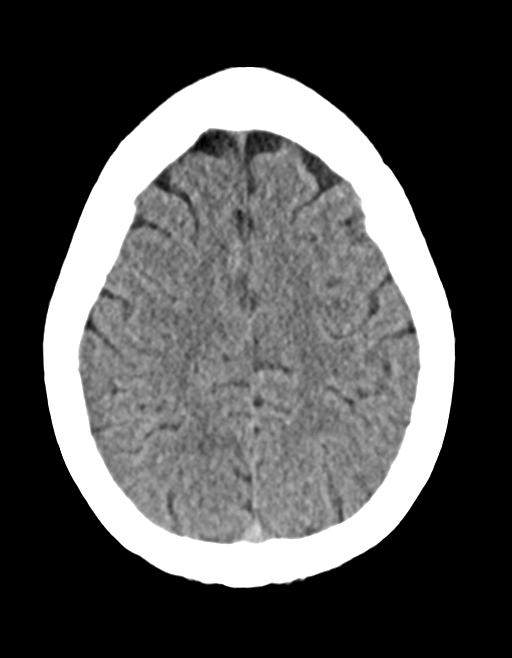
[im 23/29  brain]
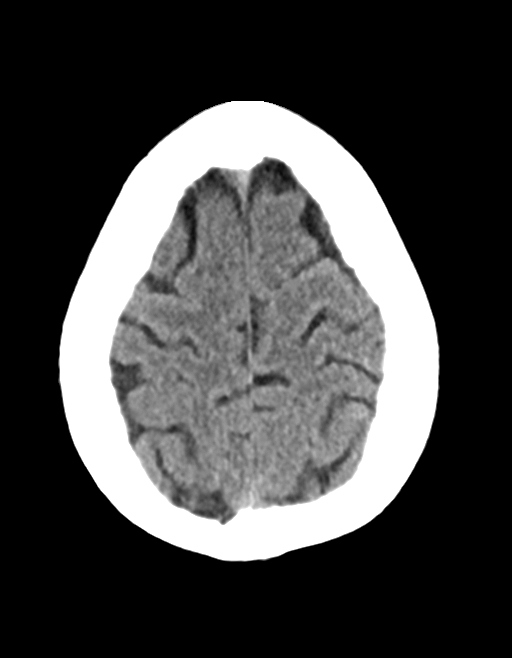
[im 27/29  brain]
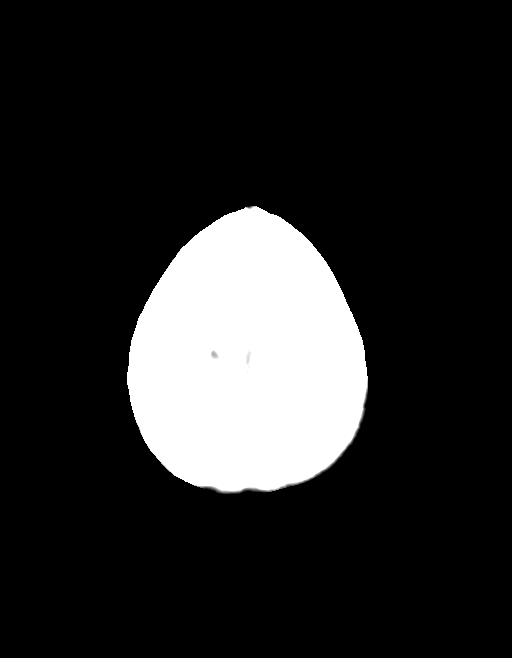
[im 27/29  bone]
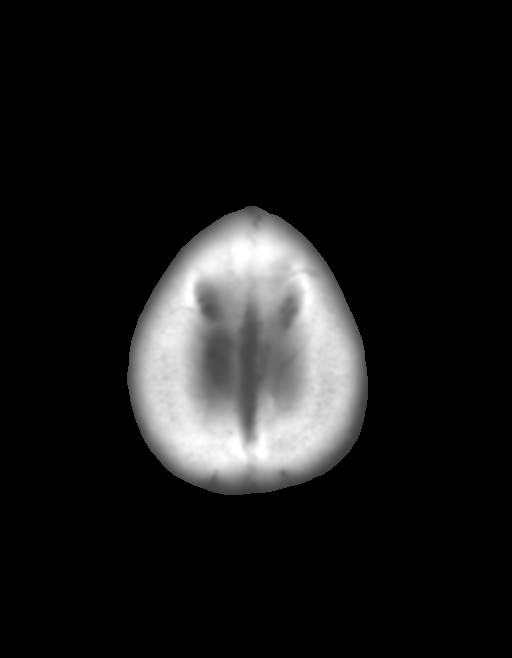

[Series 6: coronals head 3.00 cor · coronal · 0.29mm/px · 3 of 65 slices shown]
[im 22/65  brain]
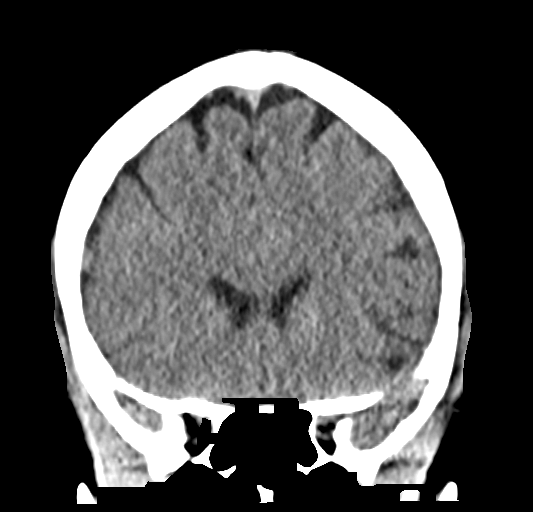
[im 29/65  brain]
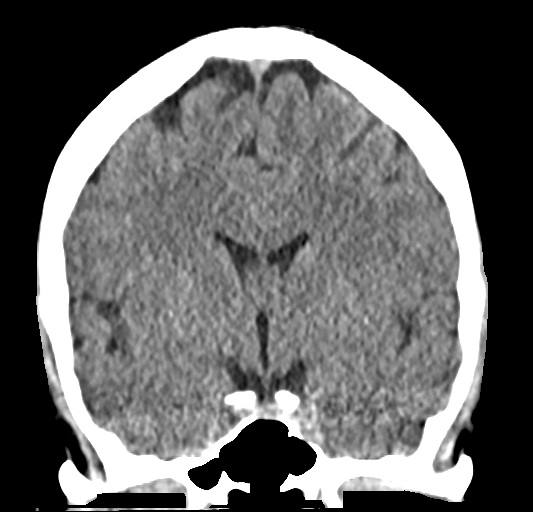
[im 36/65  brain]
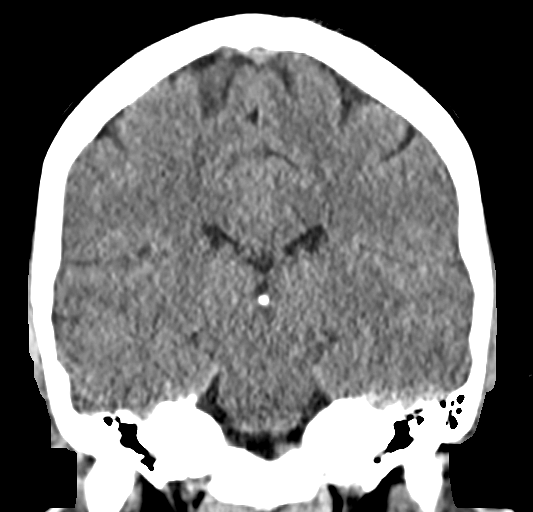

[12 of 47 positions shown; findings below may reference images not displayed]

FINDINGS: CT HEAD FINDINGS

Brain: No evidence of acute infarction, hemorrhage, hydrocephalus,
extra-axial collection or mass lesion/mass effect.

Vascular: No hyperdense vessel or unexpected calcification.

Skull: Normal. Negative for fracture or focal lesion.

Sinuses/Orbits: The visualized paranasal sinuses and mastoid air
cells are clear. Orbital structures unremarkable.

Other: None.

CT CERVICAL SPINE FINDINGS

Alignment: 2 mm grade 1 anterolisthesis C3 on C4, C4 on C5, and C5
on C6. Straightening with slight reversal of the cervical lordosis.
No facet joint dislocation. Dens and lateral masses aligned.

Skull base and vertebrae: No acute fracture. No primary bone lesion
or focal pathologic process.

Soft tissues and spinal canal: No prevertebral fluid or swelling. No
visible canal hematoma.

Disc levels: Mild disc height loss of C6-7. Uncovertebral spurring
is most pronounced at C5-6. Multilevel bulky facet arthropathy most
severe on the left at C3-4 and C4-5. Findings are most severe on the
right at C5-6 and C7-T1. Partial facet joint fusion on the right at
C5-6 and on the left at C2-3.

Upper chest: Visualized lung apices are clear.

Other: None.
IMPRESSION: 1. No acute intracranial findings.
2. No evidence of acute fracture or traumatic subluxation of the
cervical spine.
3. Moderate multilevel facet predominant cervical spondylosis.

## 2020-03-24 IMAGING — CT CT CERVICAL SPINE W/O CM
4 of 5 series · 13 of 33 positions shown, 15 images · non-contrast
Comparison: None.

CLINICAL DATA: Vertigo, right upper extremity weakness

EXAM:
CT HEAD WITHOUT CONTRAST
CT CERVICAL SPINE WITHOUT CONTRAST
TECHNIQUE: Multidetector CT imaging of the head and cervical spine was
performed following the standard protocol without intravenous
contrast. Multiplanar CT image reconstructions of the cervical spine
were also generated.

[Series 2: axial bone c-spine 2.00 · axial · 0.38mm/px · z∈[-610,-530]mm · 3 of 82 slices shown, 4 images]
[im 21/82  soft-tissue]
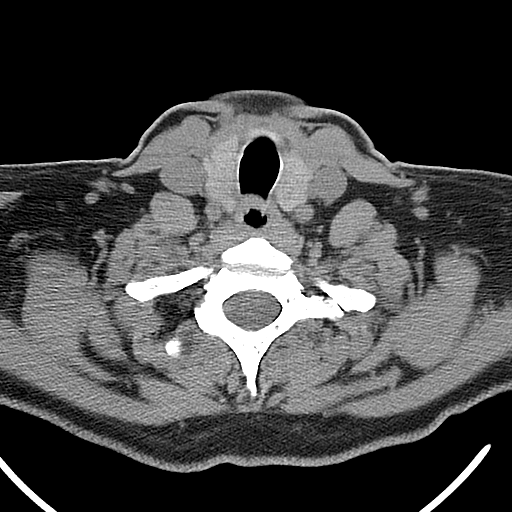
[im 21/82  bone]
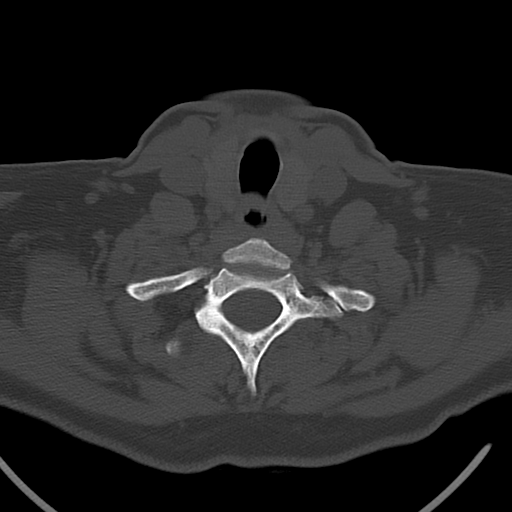
[im 41/82  bone]
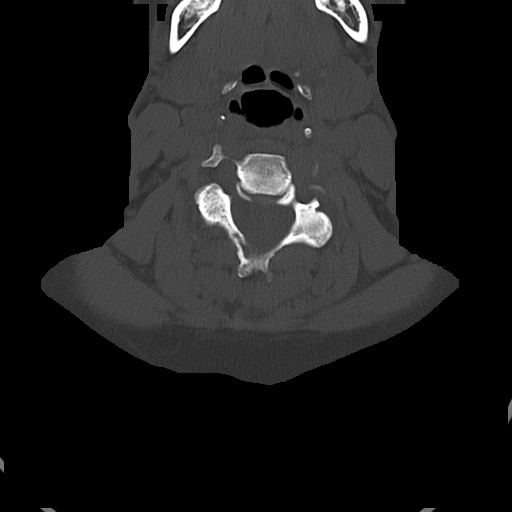
[im 61/82  bone]
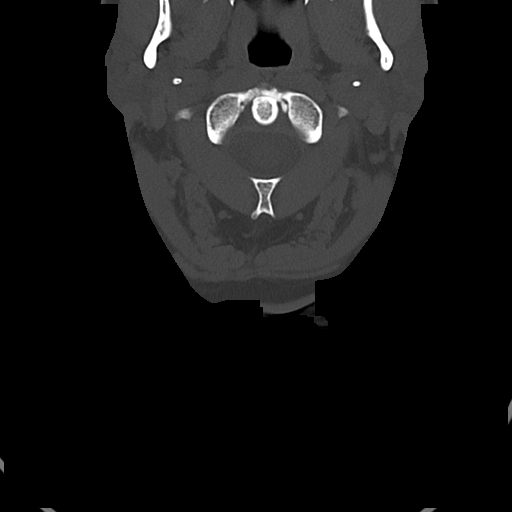

[Series 4: sag bone c-spine 2.00 sag · sagittal · 0.24mm/px · 5 of 79 slices shown, 6 images]
[im 27/79  bone]
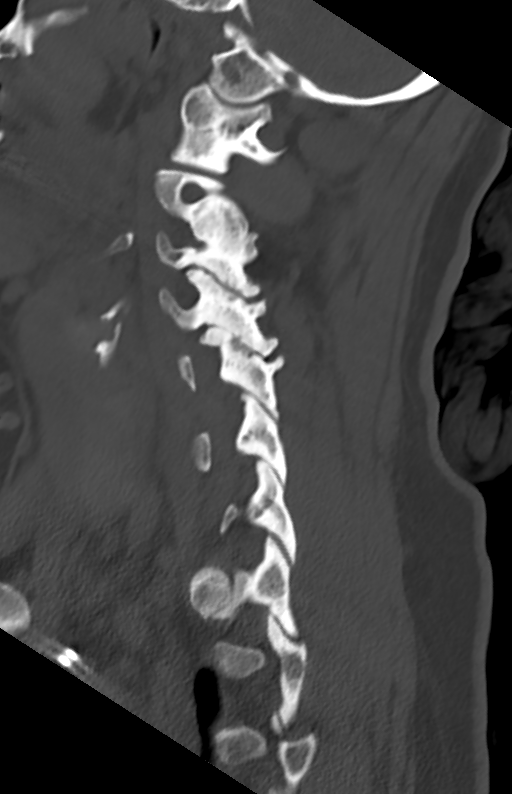
[im 33/79  bone]
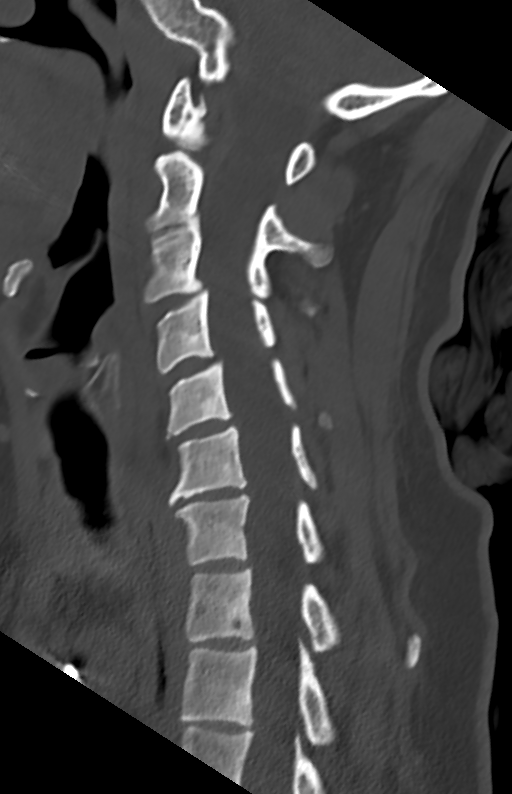
[im 40/79  soft-tissue]
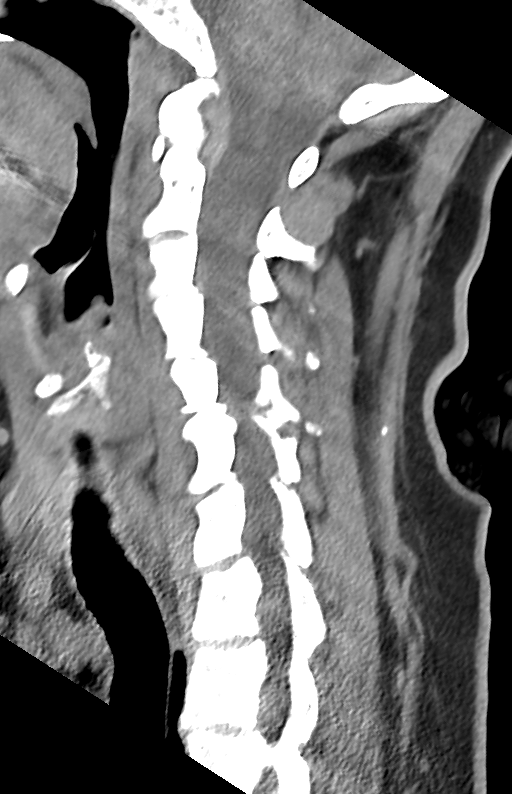
[im 40/79  bone]
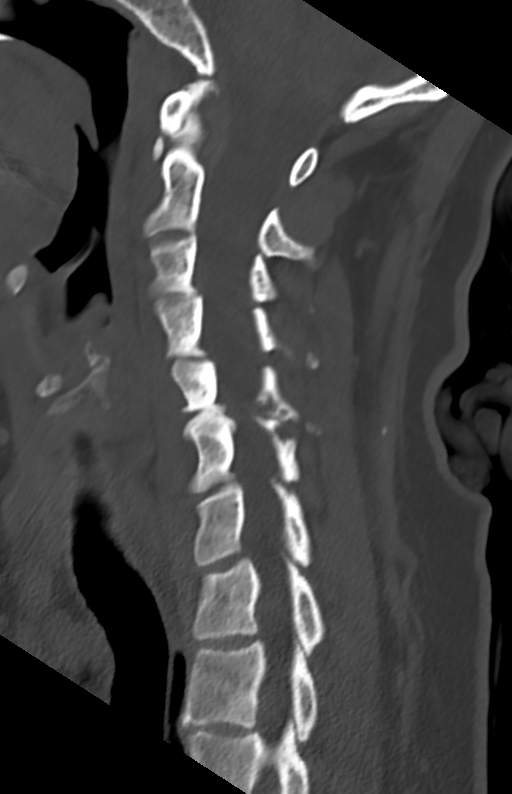
[im 46/79  bone]
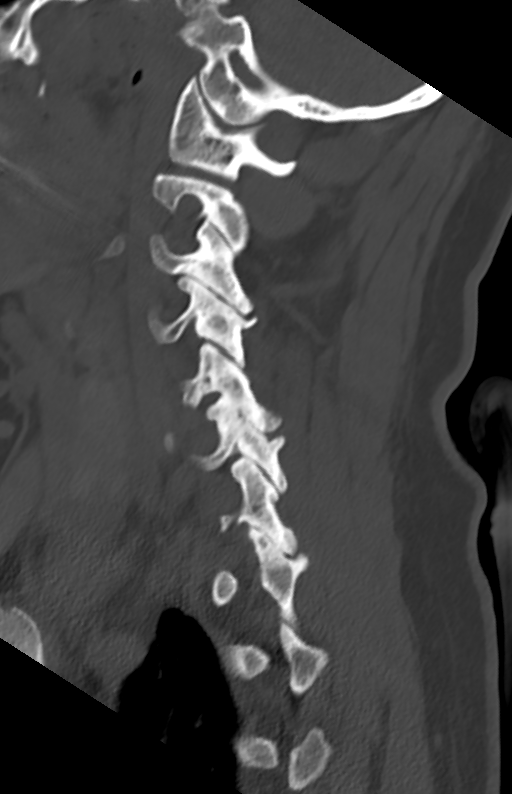
[im 53/79  bone]
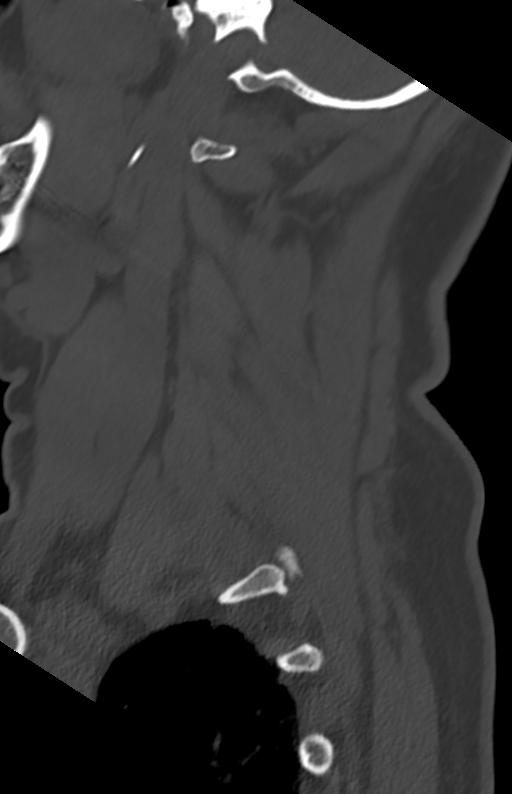

[Series 6: cor bone c-spine 2.00 cor · coronal · 0.31mm/px · 3 of 61 slices shown]
[im 17/61  bone]
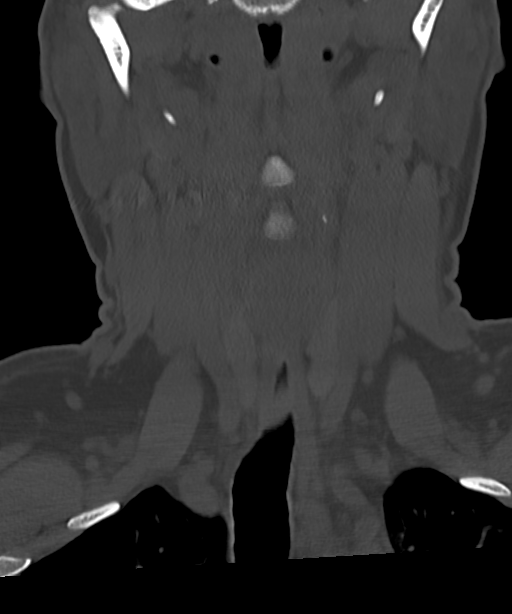
[im 26/61  bone]
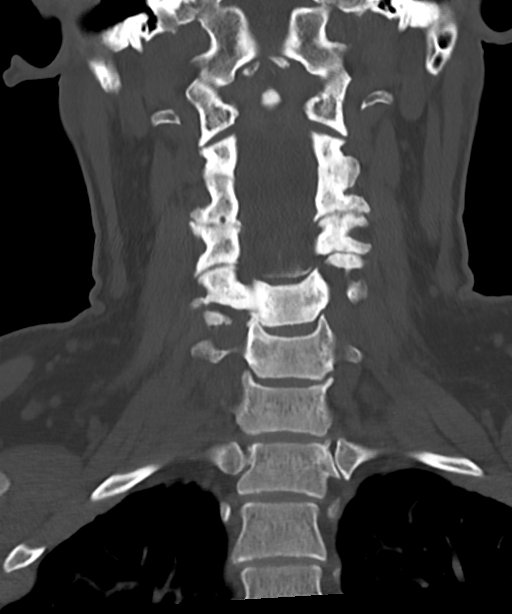
[im 35/61  bone]
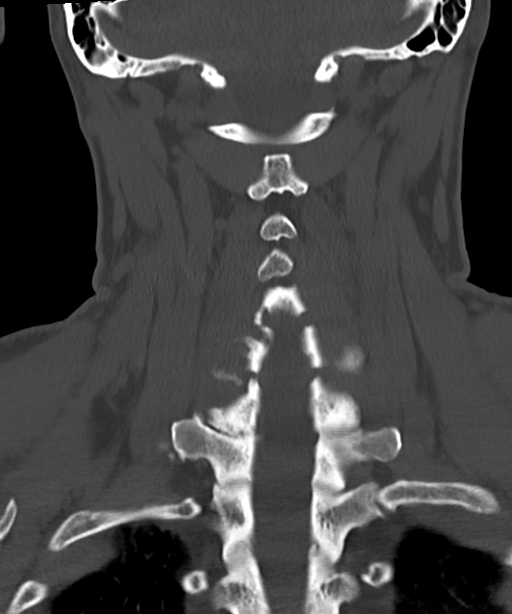

[Series 8: orthogonal axial c-spine 2.00 ax · axial · 0.24mm/px · z∈[-639,-601]mm · 2 of 94 slices shown]
[im 24/94  bone]
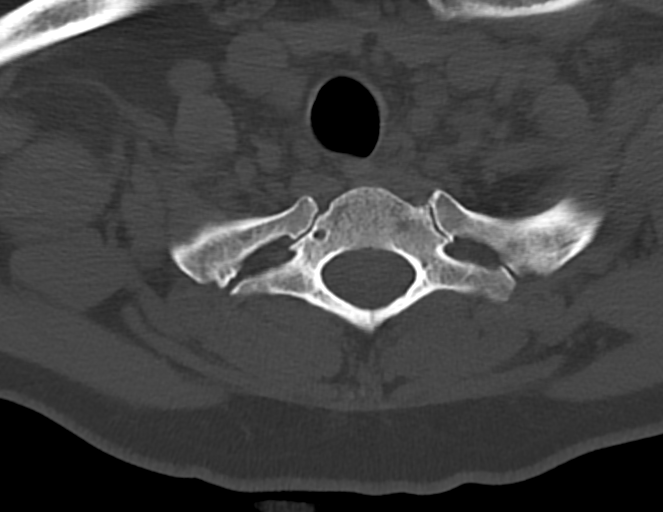
[im 47/94  bone]
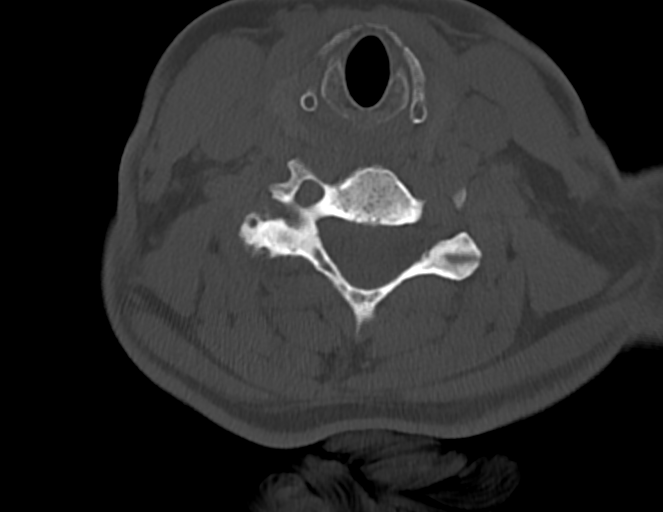

[13 of 33 positions shown; findings below may reference images not displayed]

FINDINGS: CT HEAD FINDINGS

Brain: No evidence of acute infarction, hemorrhage, hydrocephalus,
extra-axial collection or mass lesion/mass effect.

Vascular: No hyperdense vessel or unexpected calcification.

Skull: Normal. Negative for fracture or focal lesion.

Sinuses/Orbits: The visualized paranasal sinuses and mastoid air
cells are clear. Orbital structures unremarkable.

Other: None.

CT CERVICAL SPINE FINDINGS

Alignment: 2 mm grade 1 anterolisthesis C3 on C4, C4 on C5, and C5
on C6. Straightening with slight reversal of the cervical lordosis.
No facet joint dislocation. Dens and lateral masses aligned.

Skull base and vertebrae: No acute fracture. No primary bone lesion
or focal pathologic process.

Soft tissues and spinal canal: No prevertebral fluid or swelling. No
visible canal hematoma.

Disc levels: Mild disc height loss of C6-7. Uncovertebral spurring
is most pronounced at C5-6. Multilevel bulky facet arthropathy most
severe on the left at C3-4 and C4-5. Findings are most severe on the
right at C5-6 and C7-T1. Partial facet joint fusion on the right at
C5-6 and on the left at C2-3.

Upper chest: Visualized lung apices are clear.

Other: None.
IMPRESSION: 1. No acute intracranial findings.
2. No evidence of acute fracture or traumatic subluxation of the
cervical spine.
3. Moderate multilevel facet predominant cervical spondylosis.

## 2020-03-24 MED ORDER — ONDANSETRON 4 MG PO TBDP: 4.0000 mg | ORAL_TABLET | Freq: Three times a day (TID) | ORAL | 0 refills | Status: DC | PRN

## 2020-03-24 MED ORDER — MECLIZINE HCL 25 MG PO TABS: 25.0000 mg | ORAL_TABLET | Freq: Three times a day (TID) | ORAL | 0 refills | Status: DC | PRN

## 2020-03-24 MED ORDER — PROMETHAZINE HCL 25 MG/ML IJ SOLN
25.0000 mg | Freq: Once | INTRAMUSCULAR | Status: AC
  Administered 2020-03-24: 25 mg via INTRAMUSCULAR

## 2020-03-24 MED ORDER — ONDANSETRON HCL 4 MG/2ML IJ SOLN: 4.0000 mg | Freq: Once | INTRAMUSCULAR | Status: DC

## 2020-03-24 MED ORDER — PREDNISONE 10 MG PO TABS: ORAL_TABLET | ORAL | 0 refills | Status: DC

## 2020-03-24 NOTE — Progress Notes (Signed)
Patient notified of results by phone.

## 2020-03-24 NOTE — Progress Notes (Signed)
referral

## 2020-03-24 NOTE — Patient Instructions (Signed)

## 2020-03-24 NOTE — Progress Notes (Signed)
BP (!) 156/95   Pulse 64   Temp 97.8 F (36.6 C) (Oral)   Ht 5\' 4"  (1.626 m)   Wt 159 lb 12.8 oz (72.5 kg)   SpO2 100%   BMI 27.43 kg/m    Subjective:    Patient ID: Angela [name], female    DOB: May 22, 1971, 49 y.o.   MRN: [id number]  HPI: Angela [name] is a 49 y.o. female  Chief Complaint  Patient presents with  . Toe Pain    Pt concerned of toe infection.  . Extremity Weakness    R) hand weakness. Tingling and numbness in arm down into fingers. Neck pain - thinks she has a pinched nerve going down into her hand.   . Dizziness    X 4 days - possible vertigo. Room is spinning when she moves her head/ neck.. Almost fell in the shower twice this morning.   Was feeling weird in August, so called her cardiologist. Had an echo and got her metoprolol increased. She started feeling a little better, but about a week ago, she noticed that she started feeling really dizzy and started having weakness in her R hand. This got way worse last night and she has been barely able to walk or stand without feeling like she is going to fall over. Her R arm is weak and she has not been able to lift things. She has known arthritis in her neck and has not seen ortho in a couple of years due to the pandemic. She doesn't remember doing anything to her neck. She does have some pain in her neck that radiates down her R arm. She has also been weak. It's constant, it's not coming and going.  She feels weird when she moves her eye and feels really dizzy and not feeling well at all, and it has been getting worse.   DIZZINESS Duration: late last night Description of symptoms: lightheaded and off balance Duration of episode: constant Dizziness frequency: constant Provoking factors: moving head or eyes Aggravating factors:  moving head or eyes Triggered by rolling over in bed: yes Triggered by bending over: yes Aggravated by head movement: yes Aggravated by exertion, coughing, loud noises: yes Recent  head injury: no Recent or current viral symptoms: no History of vasovagal episodes: no Nausea: yes Vomiting: no Tinnitus: no Hearing loss: no Aural fullness: no Headache: no Photophobia: yes Phonophobia: no Unsteady gait: yes Postural instability: yes Diplopia, dysarthria, dysphagia: no Weakness: yes Related to exertion: yes Pallor: yes Diaphoresis: yes Dyspnea: yes Chest pain: no   Relevant past medical, surgical, family and social history reviewed and updated as indicated. Interim medical history since our last visit reviewed. Allergies and medications reviewed and updated.  Review of Systems  Constitutional: Positive for activity change and fatigue. Negative for appetite change, chills, diaphoresis, fever and unexpected weight change.  HENT: Negative.   Eyes: Positive for visual disturbance. Negative for photophobia, pain, discharge, redness and itching.  Respiratory: Negative.   Cardiovascular: Negative.   Gastrointestinal: Negative.   Musculoskeletal: Negative.   Neurological: Positive for dizziness, weakness, light-headedness and numbness. Negative for tremors, seizures, syncope, facial asymmetry, speech difficulty and headaches.  Psychiatric/Behavioral: Positive for confusion. Negative for agitation, behavioral problems, decreased concentration, dysphoric mood, hallucinations, self-injury, sleep disturbance and suicidal ideas. The patient is not nervous/anxious and is not hyperactive.     Per HPI unless specifically indicated above     Objective:    BP (!) 156/95   Pulse 64  Temp 97.8 F (36.6 C) (Oral)   Ht 5\' 4"  (1.626 m)   Wt 159 lb 12.8 oz (72.5 kg)   SpO2 100%   BMI 27.43 kg/m   Wt Readings from Last 3 Encounters:  03/24/20 159 lb 12.8 oz (72.5 kg)  02/16/20 162 lb 6.4 oz (73.7 kg)  02/14/20 161 lb (73 kg)    Physical Exam Vitals and nursing note reviewed.  Constitutional:      General: She is not in acute distress.    Appearance: Normal  appearance. She is not ill-appearing, toxic-appearing or diaphoretic.  HENT:     Head: Normocephalic and atraumatic.     Right Ear: External ear normal.     Left Ear: External ear normal.     Nose: Nose normal.     Mouth/Throat:     Mouth: Mucous membranes are moist.     Pharynx: Oropharynx is clear.  Eyes:     General: No scleral icterus.       Right eye: No discharge.        Left eye: No discharge.     Extraocular Movements: Extraocular movements intact.     Right eye: No nystagmus.     Left eye: No nystagmus.     Conjunctiva/sclera: Conjunctivae normal.     Pupils: Pupils are equal, round, and reactive to light.     Comments: Dizziness and weird feeling in her ear with eye movements  Cardiovascular:     Rate and Rhythm: Normal rate and regular rhythm.     Pulses: Normal pulses.     Heart sounds: Normal heart sounds. No murmur heard.  No friction rub. No gallop.   Pulmonary:     Effort: Pulmonary effort is normal. No respiratory distress.     Breath sounds: Normal breath sounds. No stridor. No wheezing, rhonchi or rales.  Chest:     Chest wall: No tenderness.  Musculoskeletal:        General: Normal range of motion.     Cervical back: Normal range of motion and neck supple.  Skin:    General: Skin is warm and dry.     Capillary Refill: Capillary refill takes less than 2 seconds.     Coloration: Skin is not jaundiced or pale.     Findings: No bruising, erythema, lesion or rash.  Neurological:     Mental Status: She is alert and oriented to person, place, and time. Mental status is at baseline.     Cranial Nerves: No cranial nerve deficit.     Motor: Weakness (r grip strength and R arm) present.     Coordination: Coordination abnormal.  Psychiatric:        Mood and Affect: Mood normal.        Behavior: Behavior normal.        Thought Content: Thought content normal.        Judgment: Judgment normal.     Results for orders placed or performed in visit on 03/03/20    ECHOCARDIOGRAM COMPLETE  Result Value Ref Range   Area-P 1/2 3.53 cm2   S' Lateral 3.00 cm      Assessment & Plan:   Problem List Items Addressed This Visit    None    Visit Diagnoses    Dizziness    -  Primary   Concern for vertigo, but given R arm weakness and her heart issues, we will get her in for CT scan of head and neck to R/o stroke vs  acute disc herniation.   Relevant Orders   EKG 12-Lead (Completed)   Other cervical disc degeneration, unspecified cervical region       Possibly causing her arm weakness, but with the dizziness, will get CT head and neck to R/o stroke vs acute disc herniation.    Relevant Orders   CT CERVICAL SPINE WO CONTRAST   Vertigo of central origin       Will get CT of head and neck. Await results. Treat with IM phenergan, zofran, meclizine and epley's manuvers. Follow up 1 week.    Relevant Orders   CT Head Wo Contrast   Nausea       IM phenergan today. PO zofran at home. Continue to montior.    Relevant Medications   promethazine (PHENERGAN) injection 25 mg (Completed)   Right arm weakness       Possibly due to cervical DJD, but with the dizziness, will get CT head and neck to R/o stroke vs acute disc herniation.   Pain of toe of right foot       Resolved. Continue to monitor.    SOB (shortness of breath)       Checking CT. EKG stable today. Continue to monitor.       Follow up plan: Return in about 1 week (around 03/31/2020).   >40 minutes spent with patient today

## 2020-03-27 NOTE — Progress Notes (Signed)
Interpreted by me on 03/24/20. Paced rhythm at 63bpm, no ST segment changes, WPW.

## 2020-04-03 ENCOUNTER — Encounter: Payer: Self-pay | Admitting: Family Medicine

## 2020-04-03 ENCOUNTER — Ambulatory Visit: Payer: Medicare PPO | Admitting: Family Medicine

## 2020-04-03 ENCOUNTER — Other Ambulatory Visit: Payer: Self-pay

## 2020-04-03 VITALS — BP 108/70 | HR 65 | Temp 98.2°F | Wt 164.6 lb

## 2020-04-03 DIAGNOSIS — M5030 Other cervical disc degeneration, unspecified cervical region: Secondary | ICD-10-CM | POA: Diagnosis not present

## 2020-04-03 DIAGNOSIS — H814 Vertigo of central origin: Secondary | ICD-10-CM

## 2020-04-03 NOTE — Progress Notes (Signed)
BP 108/70   Pulse 65   Temp 98.2 F (36.8 C) (Oral)   Wt 164 lb 9.6 oz (74.7 kg)   SpO2 98%   BMI 28.25 kg/m    Subjective:    Patient ID: Angela [name], female    DOB: Sep 16, 1970, 49 y.o.   MRN: [id number]  HPI: Angela [name] is a 49 y.o. female  Chief Complaint  Patient presents with  . Neck Pain    pt states she is better this week, states she has an appointment with neurosurgery on Friday    Dizziness is better. Had 1 episode on Sunday, but none since then. She notes that her neck and arm are really bothering her still. Seeing neurosurgery on Friday. Has been quite nauseous. She notes that even typing with her R arm is bothering her. She knew that her arthritis has been getting worse. Prednisone really helped calm things down, then it started to come back over the weekend. She is noticing weakness and some tingling. She is otherwise feeling pretty well. No other concerns or complaints at this time.   Relevant past medical, surgical, family and social history reviewed and updated as indicated. Interim medical history since our last visit reviewed. Allergies and medications reviewed and updated.  Review of Systems  Constitutional: Negative.   Respiratory: Negative.   Cardiovascular: Negative.   Gastrointestinal: Negative.   Musculoskeletal: Positive for neck pain and neck stiffness. Negative for arthralgias, back pain, gait problem, joint swelling and myalgias.  Skin: Negative.   Psychiatric/Behavioral: Negative.     Per HPI unless specifically indicated above     Objective:    BP 108/70   Pulse 65   Temp 98.2 F (36.8 C) (Oral)   Wt 164 lb 9.6 oz (74.7 kg)   SpO2 98%   BMI 28.25 kg/m   Wt Readings from Last 3 Encounters:  04/03/20 164 lb 9.6 oz (74.7 kg)  03/24/20 159 lb 12.8 oz (72.5 kg)  02/16/20 162 lb 6.4 oz (73.7 kg)    Physical Exam Vitals and nursing note reviewed.  Constitutional:      General: She is not in acute distress.     Appearance: Normal appearance. She is not ill-appearing, toxic-appearing or diaphoretic.  HENT:     Head: Normocephalic and atraumatic.     Right Ear: External ear normal.     Left Ear: External ear normal.     Nose: Nose normal.     Mouth/Throat:     Mouth: Mucous membranes are moist.     Pharynx: Oropharynx is clear.  Eyes:     General: No scleral icterus.       Right eye: No discharge.        Left eye: No discharge.     Extraocular Movements: Extraocular movements intact.     Conjunctiva/sclera: Conjunctivae normal.     Pupils: Pupils are equal, round, and reactive to light.  Cardiovascular:     Rate and Rhythm: Normal rate and regular rhythm.     Pulses: Normal pulses.     Heart sounds: Normal heart sounds. No murmur heard.  No friction rub. No gallop.   Pulmonary:     Effort: Pulmonary effort is normal. No respiratory distress.     Breath sounds: Normal breath sounds. No stridor. No wheezing, rhonchi or rales.  Chest:     Chest wall: No tenderness.  Musculoskeletal:        General: Normal range of motion.  Cervical back: Normal range of motion and neck supple.  Skin:    General: Skin is warm and dry.     Capillary Refill: Capillary refill takes less than 2 seconds.     Coloration: Skin is not jaundiced or pale.     Findings: No bruising, erythema, lesion or rash.  Neurological:     General: No focal deficit present.     Mental Status: She is alert and oriented to person, place, and time. Mental status is at baseline.  Psychiatric:        Mood and Affect: Mood normal.        Behavior: Behavior normal.        Thought Content: Thought content normal.        Judgment: Judgment normal.     Results for orders placed or performed in visit on 03/03/20  ECHOCARDIOGRAM COMPLETE  Result Value Ref Range   Area-P 1/2 3.53 cm2   S' Lateral 3.00 cm      Assessment & Plan:   Problem List Items Addressed This Visit    None    Visit Diagnoses    Other cervical disc  degeneration, unspecified cervical region    -  Primary   Seeing neurosurgery on Friday. Continue to follow with them. Call with any concerns.    Vertigo of central origin       Resolving. Significantly better. Continue to monitor.        Follow up plan: Return March, for Physical.   >15 minutes spent with patient today.

## 2020-04-04 ENCOUNTER — Encounter: Payer: Self-pay | Admitting: Family Medicine

## 2020-04-07 DIAGNOSIS — M47816 Spondylosis without myelopathy or radiculopathy, lumbar region: Secondary | ICD-10-CM | POA: Diagnosis not present

## 2020-04-07 DIAGNOSIS — M542 Cervicalgia: Secondary | ICD-10-CM | POA: Diagnosis not present

## 2020-04-07 DIAGNOSIS — M47814 Spondylosis without myelopathy or radiculopathy, thoracic region: Secondary | ICD-10-CM | POA: Diagnosis not present

## 2020-04-07 DIAGNOSIS — M4312 Spondylolisthesis, cervical region: Secondary | ICD-10-CM | POA: Diagnosis not present

## 2020-04-07 DIAGNOSIS — M5412 Radiculopathy, cervical region: Secondary | ICD-10-CM | POA: Diagnosis not present

## 2020-04-07 DIAGNOSIS — M5416 Radiculopathy, lumbar region: Secondary | ICD-10-CM | POA: Diagnosis not present

## 2020-04-10 ENCOUNTER — Other Ambulatory Visit (HOSPITAL_COMMUNITY): Payer: Self-pay | Admitting: Nurse Practitioner

## 2020-04-10 DIAGNOSIS — M5412 Radiculopathy, cervical region: Secondary | ICD-10-CM

## 2020-04-24 ENCOUNTER — Ambulatory Visit (HOSPITAL_COMMUNITY)
Admission: RE | Admit: 2020-04-24 | Discharge: 2020-04-24 | Disposition: A | Discharge status: Home / Self Care | Payer: Medicare PPO | Source: Ambulatory Visit | Attending: Nurse Practitioner | Admitting: Nurse Practitioner

## 2020-04-24 ENCOUNTER — Other Ambulatory Visit: Payer: Self-pay

## 2020-04-24 DIAGNOSIS — M4722 Other spondylosis with radiculopathy, cervical region: Secondary | ICD-10-CM | POA: Diagnosis not present

## 2020-04-24 DIAGNOSIS — M50122 Cervical disc disorder at C5-C6 level with radiculopathy: Secondary | ICD-10-CM | POA: Diagnosis not present

## 2020-04-24 DIAGNOSIS — M50121 Cervical disc disorder at C4-C5 level with radiculopathy: Secondary | ICD-10-CM | POA: Diagnosis not present

## 2020-04-24 DIAGNOSIS — M5412 Radiculopathy, cervical region: Secondary | ICD-10-CM | POA: Insufficient documentation

## 2020-04-24 DIAGNOSIS — M50123 Cervical disc disorder at C6-C7 level with radiculopathy: Secondary | ICD-10-CM | POA: Diagnosis not present

## 2020-04-24 IMAGING — MR MR CERVICAL SPINE W/O CM
5 series · 37 of 48 positions shown · non-contrast
Comparison: [DATE] CT cervical spine.

CLINICAL DATA: Cervical radiculopathy

EXAM:
MRI CERVICAL SPINE WITHOUT CONTRAST
TECHNIQUE: Multiplanar, multisequence MR imaging of the cervical spine was
performed. No intravenous contrast was administered.

[Series 5: T1 · sagittal · 3.0mm · 0.69mm/px · 6 of 15 slices shown]
[im 1/15]
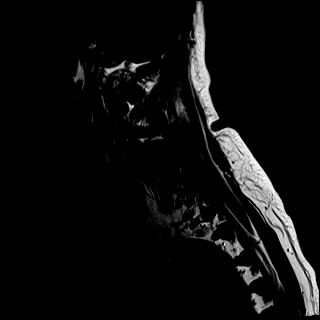
[im 3/15]
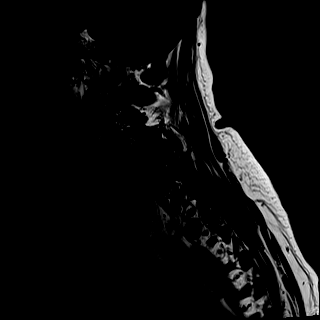
[im 6/15]
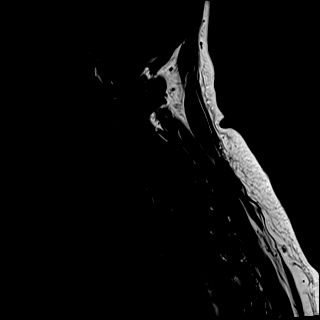
[im 9/15]
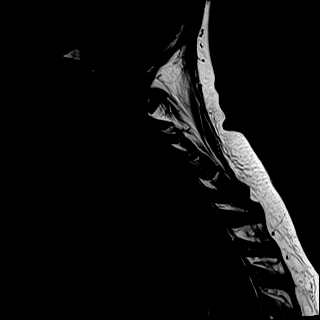
[im 12/15]
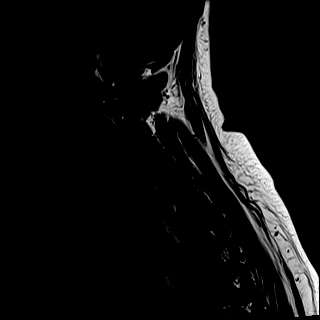
[im 15/15]
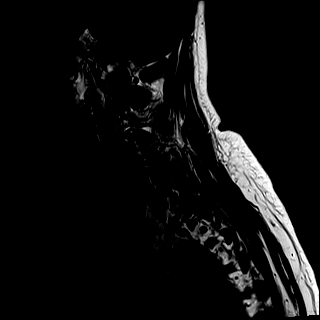

[Series 6: T2 · sagittal · 3.0mm · 0.69mm/px · 6 of 15 slices shown (1 of 2)]
[im 1/15]
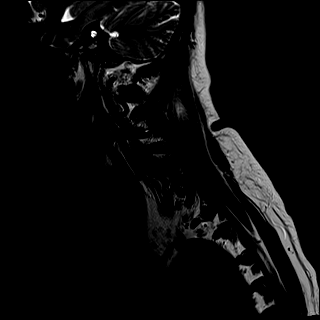
[im 3/15]
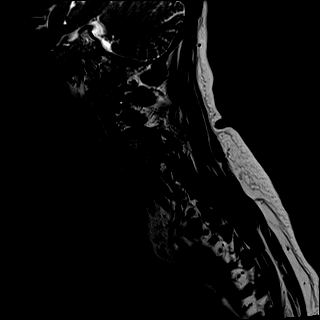
[im 6/15]
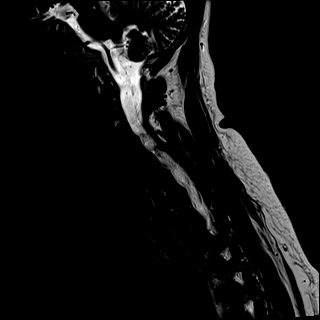
[im 9/15]
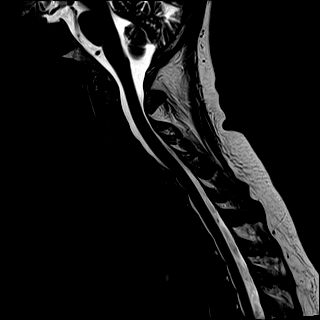
[im 12/15]
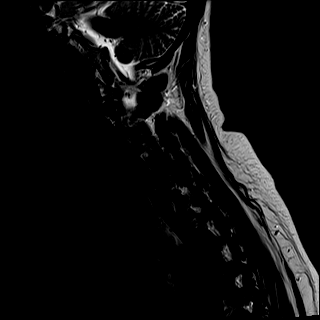
[im 15/15]
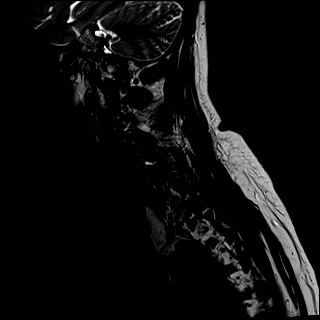

[Series 7: STIR · sagittal · 3.0mm · 0.86mm/px · 6 of 15 slices shown]
[im 1/15]
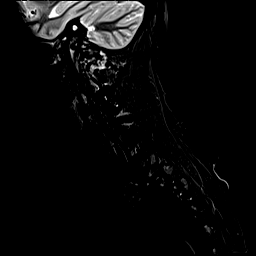
[im 3/15]
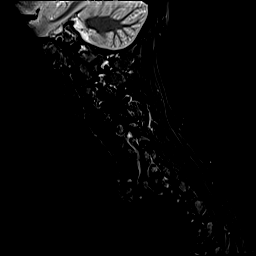
[im 6/15]
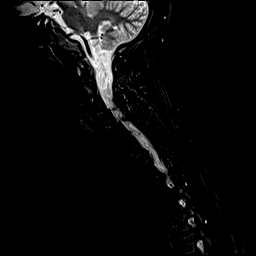
[im 9/15]
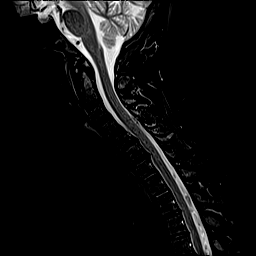
[im 12/15]
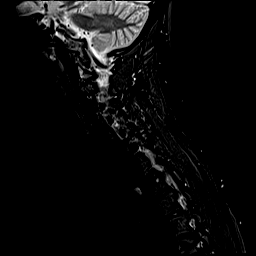
[im 15/15]
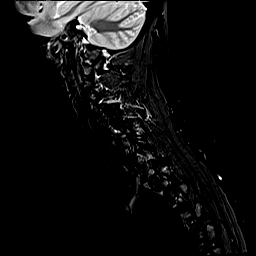

[Series 8: T2 · axial · 3.0mm · 0.66mm/px · z∈[-133,-36]mm · 11 of 35 slices shown (2 of 2)]
[im 1/35]
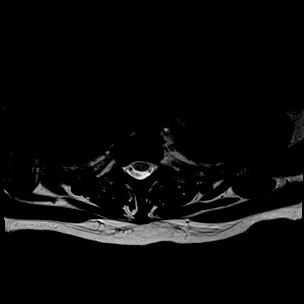
[im 3/35]
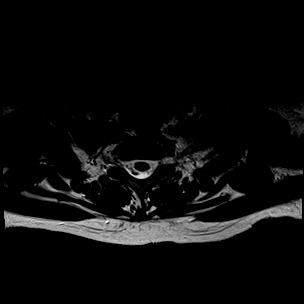
[im 5/35]
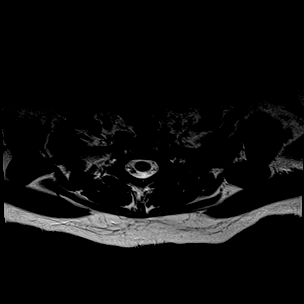
[im 8/35]
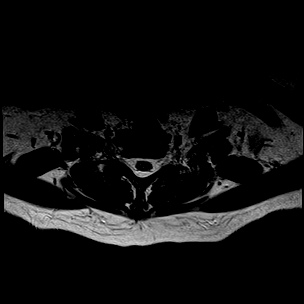
[im 10/35]
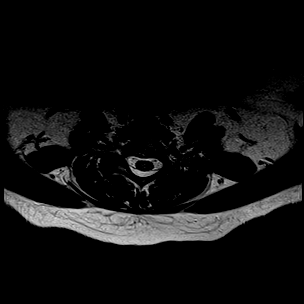
[im 15/35]
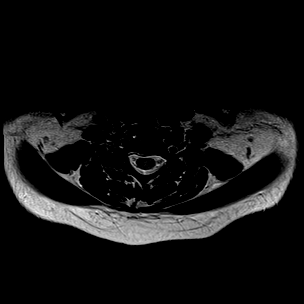
[im 18/35]
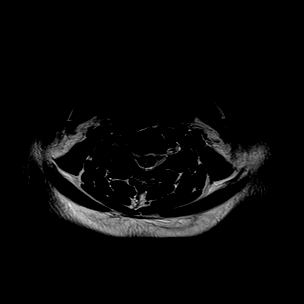
[im 20/35]
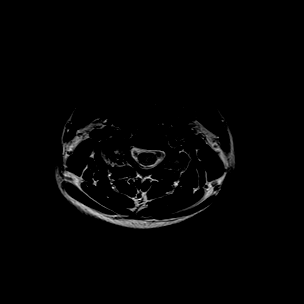
[im 25/35]
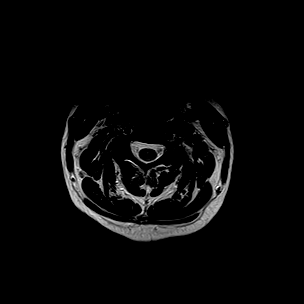
[im 30/35]
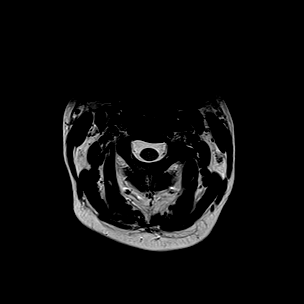
[im 35/35]
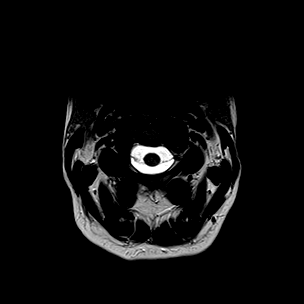

[Series 9: GRE · axial · 3.0mm · 0.39mm/px · z∈[-133,-36]mm · 8 of 35 slices shown]
[im 1/35]
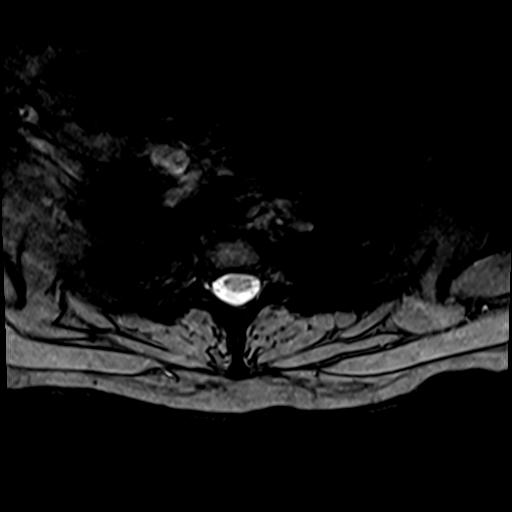
[im 5/35]
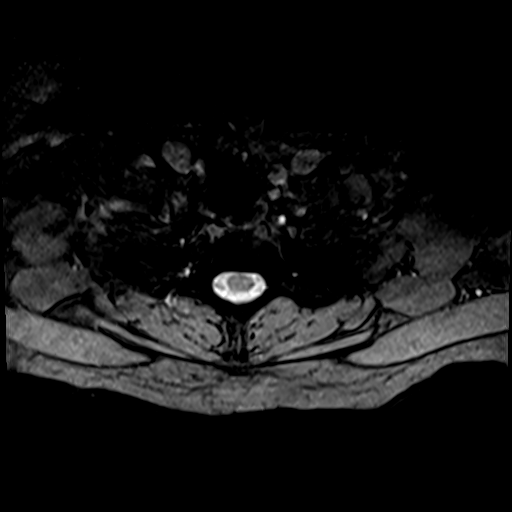
[im 10/35]
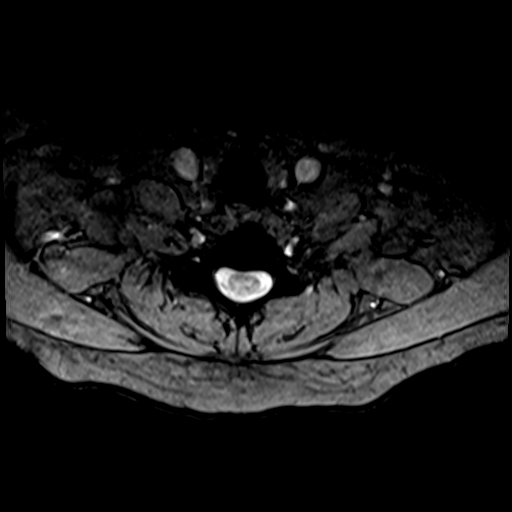
[im 15/35]
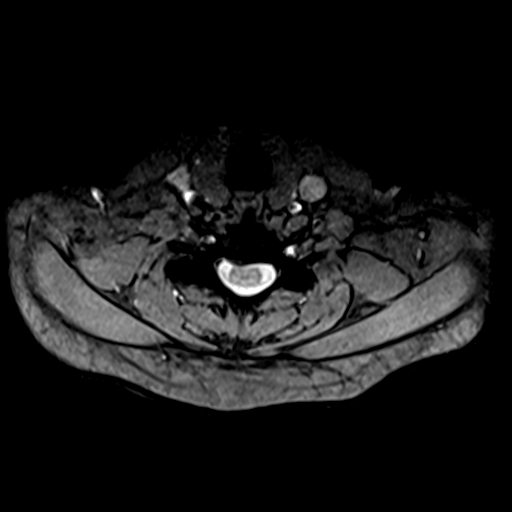
[im 20/35]
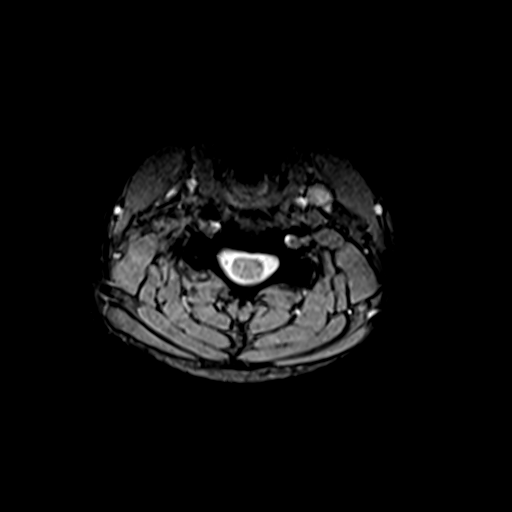
[im 25/35]
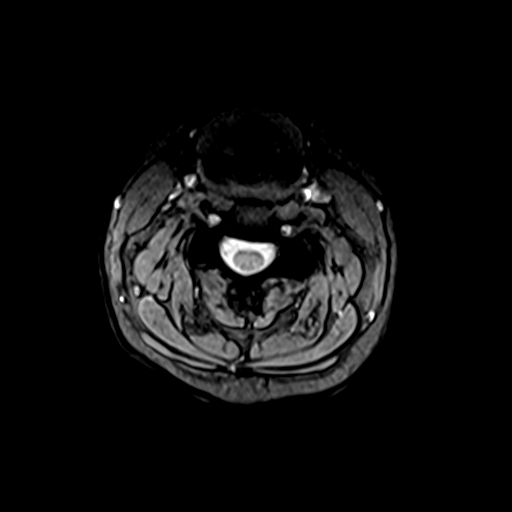
[im 30/35]
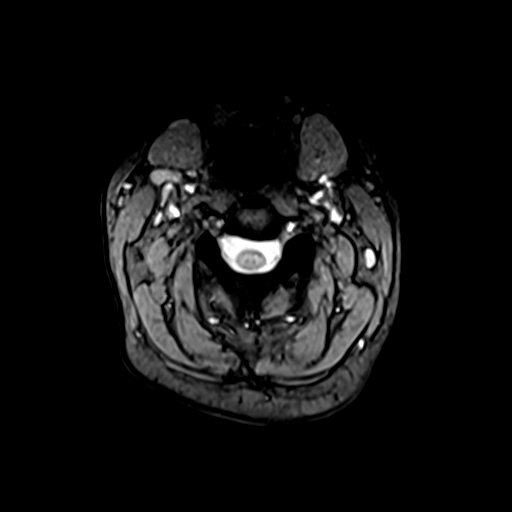
[im 35/35]
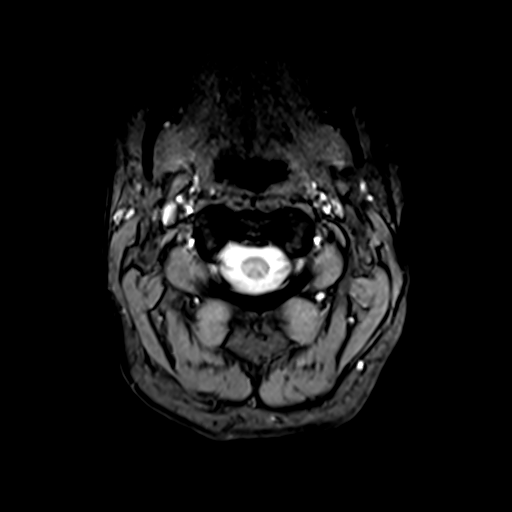

[37 of 48 positions shown; findings below may reference images not displayed]

FINDINGS: Alignment: Straightening of lordosis. Stepwise grade 1
anterolisthesis at the C3-6 levels.

Vertebrae: No focal osseous lesion. Minimal Modic type 2 endplate
degenerative changes at the C6-7 level.

Cord: Normal signal and morphology.

Posterior Fossa, vertebral arteries: Negative.

Disc levels: Mild multilevel desiccation however disc spaces are
grossly preserved.

C2-3: Left uncovertebral and facet degenerative spurring. Patent
spinal canal and neural foramen.

C3-4: Uncovertebral and facet degenerative spurring. Patent spinal
canal and neural foramen.

C4-5: Disc osteophyte complex with superimposed left foraminal
protrusion, uncovertebral and facet hypertrophy. Patent spinal canal
and right neural foramen. Mild left neural foraminal narrowing.

C5-6: Disc osteophyte complex with superimposed right
paracentral/foraminal protrusion. Uncovertebral and facet
degenerative spurring. Partial effacement of the ventral CSF
containing spaces. Mild right neural foraminal narrowing. Patent
spinal canal and left neural foramen.

C6-7: Disc osteophyte complex with superimposed shallow central and
left subarticular protrusions. Uncovertebral and facet degenerative
spurring. Mild left neural foraminal narrowing. Patent spinal canal
and right neural foramen.

C7-T1: Right uncovertebral and facet hypertrophy. Mild right neural
foraminal narrowing. Patent spinal canal and left neural foramen.

Paraspinal tissues: Negative.
IMPRESSION: Multilevel spondylosis.  Patent spinal canal.

Mild left C4-5, right C5-6, left C6-7 and right C7-T1 neural
foraminal narrowing.

## 2020-04-24 NOTE — Progress Notes (Signed)
Patient here today for MRI C-spine wo contrast. Patient has medtronic device. carelink express sent to [facility]- Cardiology PA and Mike-medtronic rep. Order received for DOO rate of 90. Will re-program once scan is complete.

## 2020-05-01 DIAGNOSIS — M150 Primary generalized (osteo)arthritis: Secondary | ICD-10-CM | POA: Insufficient documentation

## 2020-05-01 DIAGNOSIS — M159 Polyosteoarthritis, unspecified: Secondary | ICD-10-CM | POA: Insufficient documentation

## 2020-05-01 DIAGNOSIS — M47812 Spondylosis without myelopathy or radiculopathy, cervical region: Secondary | ICD-10-CM | POA: Insufficient documentation

## 2020-05-01 DIAGNOSIS — M8949 Other hypertrophic osteoarthropathy, multiple sites: Secondary | ICD-10-CM | POA: Diagnosis not present

## 2020-05-01 DIAGNOSIS — M542 Cervicalgia: Secondary | ICD-10-CM | POA: Diagnosis not present

## 2020-05-04 ENCOUNTER — Encounter: Payer: Self-pay | Admitting: Family Medicine

## 2020-05-04 DIAGNOSIS — R29898 Other symptoms and signs involving the musculoskeletal system: Secondary | ICD-10-CM

## 2020-05-04 DIAGNOSIS — M5030 Other cervical disc degeneration, unspecified cervical region: Secondary | ICD-10-CM

## 2020-05-09 ENCOUNTER — Encounter: Payer: Self-pay | Admitting: Family Medicine

## 2020-05-10 ENCOUNTER — Ambulatory Visit (INDEPENDENT_AMBULATORY_CARE_PROVIDER_SITE_OTHER): Payer: Medicare PPO

## 2020-05-10 DIAGNOSIS — I442 Atrioventricular block, complete: Secondary | ICD-10-CM

## 2020-05-11 DIAGNOSIS — M4802 Spinal stenosis, cervical region: Secondary | ICD-10-CM | POA: Diagnosis not present

## 2020-05-11 DIAGNOSIS — M5412 Radiculopathy, cervical region: Secondary | ICD-10-CM | POA: Diagnosis not present

## 2020-05-11 LAB — CUP PACEART REMOTE DEVICE CHECK
Battery Remaining Longevity: 37 mo
Battery Voltage: 2.97 V
Brady Statistic AP VP Percent: 18.57 %
Brady Statistic AP VS Percent: 0.02 %
Brady Statistic AS VP Percent: 81.36 %
Brady Statistic AS VS Percent: 0.04 %
Brady Statistic RA Percent Paced: 18.58 %
Brady Statistic RV Percent Paced: 99.91 %
Date Time Interrogation Session: 20211208105546
Implantable Lead Implant Date: 20070319
Implantable Lead Implant Date: 20070319
Implantable Lead Location: 753859
Implantable Lead Location: 753860
Implantable Lead Model: 5076
Implantable Lead Model: 5076
Implantable Pulse Generator Implant Date: 20160210
Lead Channel Impedance Value: 285 Ohm
Lead Channel Impedance Value: 399 Ohm
Lead Channel Impedance Value: 418 Ohm
Lead Channel Impedance Value: 456 Ohm
Lead Channel Pacing Threshold Amplitude: 0.75 V
Lead Channel Pacing Threshold Amplitude: 0.875 V
Lead Channel Pacing Threshold Pulse Width: 0.4 ms
Lead Channel Pacing Threshold Pulse Width: 0.4 ms
Lead Channel Sensing Intrinsic Amplitude: 3.125 mV
Lead Channel Sensing Intrinsic Amplitude: 3.125 mV
Lead Channel Sensing Intrinsic Amplitude: 3.375 mV
Lead Channel Sensing Intrinsic Amplitude: 3.375 mV
Lead Channel Setting Pacing Amplitude: 2 V
Lead Channel Setting Pacing Amplitude: 2.5 V
Lead Channel Setting Pacing Pulse Width: 0.4 ms
Lead Channel Setting Sensing Sensitivity: 2 mV

## 2020-05-15 DIAGNOSIS — M47812 Spondylosis without myelopathy or radiculopathy, cervical region: Secondary | ICD-10-CM | POA: Diagnosis not present

## 2020-05-15 DIAGNOSIS — R030 Elevated blood-pressure reading, without diagnosis of hypertension: Secondary | ICD-10-CM | POA: Diagnosis not present

## 2020-05-15 DIAGNOSIS — Z6827 Body mass index (BMI) 27.0-27.9, adult: Secondary | ICD-10-CM | POA: Diagnosis not present

## 2020-05-15 DIAGNOSIS — M5412 Radiculopathy, cervical region: Secondary | ICD-10-CM | POA: Diagnosis not present

## 2020-05-23 NOTE — Progress Notes (Signed)
Remote pacemaker transmission.   

## 2020-06-21 ENCOUNTER — Encounter: Payer: Self-pay | Admitting: Family Medicine

## 2020-06-21 DIAGNOSIS — R030 Elevated blood-pressure reading, without diagnosis of hypertension: Secondary | ICD-10-CM | POA: Diagnosis not present

## 2020-06-21 DIAGNOSIS — Z6828 Body mass index (BMI) 28.0-28.9, adult: Secondary | ICD-10-CM | POA: Diagnosis not present

## 2020-06-21 DIAGNOSIS — M47812 Spondylosis without myelopathy or radiculopathy, cervical region: Secondary | ICD-10-CM | POA: Diagnosis not present

## 2020-06-21 DIAGNOSIS — M5412 Radiculopathy, cervical region: Secondary | ICD-10-CM | POA: Diagnosis not present

## 2020-06-26 ENCOUNTER — Encounter: Payer: Self-pay | Admitting: Family Medicine

## 2020-06-26 NOTE — Telephone Encounter (Signed)
Angela [name] is calling due to not receiving a response back in regards to this. She states she is okay with a callback or reply via MyChart message. Please advise.

## 2020-06-27 ENCOUNTER — Other Ambulatory Visit: Payer: Self-pay

## 2020-06-27 ENCOUNTER — Encounter: Payer: Self-pay | Admitting: Family Medicine

## 2020-06-27 ENCOUNTER — Ambulatory Visit: Payer: Medicare PPO | Admitting: Family Medicine

## 2020-06-27 VITALS — BP 137/88 | HR 86 | Temp 98.0°F | Wt 165.0 lb

## 2020-06-27 DIAGNOSIS — R230 Cyanosis: Secondary | ICD-10-CM

## 2020-06-27 DIAGNOSIS — I059 Rheumatic mitral valve disease, unspecified: Secondary | ICD-10-CM | POA: Diagnosis not present

## 2020-06-27 DIAGNOSIS — I442 Atrioventricular block, complete: Secondary | ICD-10-CM

## 2020-06-27 NOTE — Patient Instructions (Signed)
It was great to see you!  Our plans for today:  - We are referring you to the Vascular Sur[facility]. If you don't hear about an appointment in the next few days, let us know.  - If you have trouble breathing, chest pain, worsened pain, discoloration that extends up your leg and doesn't go away, go to the Emergency Department.   We are checking some labs today, we will release these results to your MyChart.  Take care and seek immediate care sooner if you develop any concerns.   Dr. Ky Barban

## 2020-06-27 NOTE — Assessment & Plan Note (Signed)
Concern for limb ischemia 2/2 embolism given discoloration, coldness, known heart history. Intact DP pulses and cap refill with return of color/warmth with elevation. No motor/sensory deficits on exam. Will obtain baseline coag labs and place urgent referral to Vascular surgery for evaluation. Emergent precautions discussed.

## 2020-06-27 NOTE — Progress Notes (Signed)
   SUBJECTIVE:   CHIEF COMPLAINT / HPI:   Patient Active Problem List   Diagnosis Date Noted  . Blue toes 06/27/2020  . Rectal polyp   . Chronic constipation 10/14/2018  . Mucous cyst of digit of left hand 08/13/2018  . Low back pain 03/17/2017  . OCD (obsessive compulsive disorder) 01/24/2016  . Atrial fibrillation ([place]) 07/27/2015  . Depression, recurrent ([place]) 11/23/2014  . POTS (postural orthostatic tachycardia syndrome) 08/17/2013  . Atrial tachycardia/SINUS tachycardia 06/24/2013  . Mitral valve disease 12/04/2011  . DYSAUTONOMIA 03/23/2009  . AV BLOCK, COMPLETE 08/25/2008  . Pacemaker-Medtronic 08/25/2008   FOOT COMPLAINT - endorsing bluish discoloration to bilateral toes (up to midfoot at one point and cold to touch since Saturday - worse with walking, legs hanging down - associated with tingling, burning. Also with numbness in 2nd toe.  - Denies recent trauma, recent illness, recent interventions/operations/stent/grafts, fevers, weakness - stays fairly active - Denies recent med changes. - has h/o complete heart block, paced rhythm. Denies abnormal heart rhythm/palpitations. - recent ECHO 03/2020 with normal function, no vegetations, does have known tricuspid regurgitation. - not on anticoagulation   OBJECTIVE:   BP 137/88   Pulse 86   Temp 98 F (36.7 C)   Wt 165 lb (74.8 kg)   SpO2 100%   BMI 28.32 kg/m   Gen: well appearing, in NAD Card: RRR Lungs: CTAB Ext: bluish discoloration noted to bilateral toes, cold to touch. Intact DP pulse appreciated bilaterally. No open wounds. Bluish discoloration disappears with elevation. Cap refill 2 sec. 5/5 strength with flexion/extension/inversion/eversion. No sensory loss.  ASSESSMENT/PLAN:   Blue toes Concern for limb ischemia 2/2 embolism given discoloration, coldness, known heart history. Intact DP pulses and cap refill with return of color/warmth with elevation. No motor/sensory deficits on exam. Will obtain  baseline coag labs and place urgent referral to Vascular surgery for evaluation. Emergent precautions discussed.     Myles Gip, DO

## 2020-06-28 LAB — CBC
Hematocrit: 42.4 % (ref 34.0–46.6)
Hemoglobin: 14.3 g/dL (ref 11.1–15.9)
MCH: 31.5 pg (ref 26.6–33.0)
MCHC: 33.7 g/dL (ref 31.5–35.7)
MCV: 93 fL (ref 79–97)
Platelets: 291 10*3/uL (ref 150–450)
RBC: 4.54 x10E6/uL (ref 3.77–5.28)
RDW: 11.7 % (ref 11.7–15.4)
WBC: 7.2 10*3/uL (ref 3.4–10.8)

## 2020-06-28 LAB — PROTIME-INR
INR: 1 (ref 0.9–1.2)
Prothrombin Time: 10.2 s (ref 9.1–12.0)

## 2020-06-28 LAB — BASIC METABOLIC PANEL
BUN/Creatinine Ratio: 21 (ref 9–23)
BUN: 17 mg/dL (ref 6–24)
CO2: 24 mmol/L (ref 20–29)
Calcium: 9.4 mg/dL (ref 8.7–10.2)
Chloride: 102 mmol/L (ref 96–106)
Creatinine, Ser: 0.82 mg/dL (ref 0.57–1.00)
GFR calc Af Amer: 97 mL/min/{1.73_m2} (ref >59)
GFR calc non Af Amer: 84 mL/min/{1.73_m2} (ref >59)
Glucose: 78 mg/dL (ref 65–99)
Potassium: 4.5 mmol/L (ref 3.5–5.2)
Sodium: 140 mmol/L (ref 134–144)

## 2020-06-28 LAB — D-DIMER, QUANTITATIVE: D-DIMER: 0.29 mg/L FEU (ref 0.00–0.49)

## 2020-06-28 LAB — FIBRINOGEN: Fibrinogen: 404 mg/dL (ref 193–507)

## 2020-06-29 ENCOUNTER — Other Ambulatory Visit (INDEPENDENT_AMBULATORY_CARE_PROVIDER_SITE_OTHER): Payer: Self-pay | Admitting: Vascular Surgery

## 2020-06-29 DIAGNOSIS — R230 Cyanosis: Secondary | ICD-10-CM

## 2020-06-30 ENCOUNTER — Ambulatory Visit (INDEPENDENT_AMBULATORY_CARE_PROVIDER_SITE_OTHER): Payer: Medicare PPO | Admitting: Vascular Surgery

## 2020-06-30 ENCOUNTER — Other Ambulatory Visit: Payer: Self-pay

## 2020-06-30 ENCOUNTER — Ambulatory Visit (INDEPENDENT_AMBULATORY_CARE_PROVIDER_SITE_OTHER): Payer: Medicare PPO

## 2020-06-30 ENCOUNTER — Encounter (INDEPENDENT_AMBULATORY_CARE_PROVIDER_SITE_OTHER): Payer: Self-pay | Admitting: Vascular Surgery

## 2020-06-30 VITALS — BP 124/84 | HR 72 | Resp 16 | Ht 64.5 in | Wt 168.4 lb

## 2020-06-30 DIAGNOSIS — I059 Rheumatic mitral valve disease, unspecified: Secondary | ICD-10-CM | POA: Diagnosis not present

## 2020-06-30 DIAGNOSIS — R230 Cyanosis: Secondary | ICD-10-CM

## 2020-06-30 DIAGNOSIS — I471 Supraventricular tachycardia: Secondary | ICD-10-CM

## 2020-06-30 NOTE — Assessment & Plan Note (Signed)
We performed noninvasive studies today which showed normal triphasic ABIs bilaterally of 1.2 on the right and 1.3 on the left.  The digital tracings are somewhat dampened bilaterally.   Given these findings and her lack of atherosclerotic disease, this is less likely atheroembolic disease and more than likely some component of vasospasm like Raynaud's disease or some cardiac dysfunction creating some venous ingestion.  Either way, this is certainly not dangerous or requiring any intervention at this time.  Her symptoms are fairly mild.  I do think the addition of an aspirin daily is a reasonable option given her previous cardiac history and new findings.  Avoiding cold stimulation is recommended.  Elevating her legs and continuing to be active is important.  Using compression socks may be of some benefit as well.  At this point, I will see the patient back on an as-needed basis unless her symptoms worsen.

## 2020-06-30 NOTE — Progress Notes (Signed)
Patient ID: Angela [name], female   DOB: [birth year], 50 y.o.   MRN: [id number]  Chief Complaint  Patient presents with  . New Patient (Initial Visit)    Ref [place] blue toes    HPI Angela [name] is a 50 y.o. female.  I am asked to see the patient by Dr. Ky Barban for evaluation of blue toes bilaterally.  The patient has noticed this for about a week.  She says they feel little bit funny and they are definitely cold, but they are not overtly painful.  There was no trauma or injury.  No clear inciting event.  She does have a cardiac history with normal coronaries previously but required a pacemaker and does have some degree of tricuspid regurgitation and right atrial enlargement.  She denies any ulcers or infection.  No claudication symptoms.  No rest pain.  We performed noninvasive studies today which showed normal triphasic ABIs bilaterally of 1.2 on the right and 1.3 on the left.  The digital tracings are somewhat dampened bilaterally.     Past Medical History:  Diagnosis Date  . Chest pain    a cath 2/10. EF 50-55% normal coronaries  . Complete heart block ([place])   . Depression   . Dysautonomia ([place])   . Dyspnea    CT negative for PE, 2007  . Hypertension   . Insomnia   . OCD (obsessive compulsive disorder)   . SVT (supraventricular tachycardia) ([facility])    s/p ablation a. c/b AV nod ablation requiring pacemaker    Past Surgical History:  Procedure Laterality Date  . BREAST BIOPSY Right 1998  . BREAST BIOPSY Right 2004  . BREAST LUMPECTOMY     RIGHT -BREAST   . COLONOSCOPY WITH PROPOFOL N/A 09/07/2019   Procedure: COLONOSCOPY WITH PROPOFOL;  Surgeon: Lucilla Lame, MD;  Location[facility] END[facility]COPY;  Service: Endoscopy;  Laterality: N/A;  . ENDOMETRIAL ABLATION  2005  . INSERT / REPLACE / REMOVE PACEMAKER    . PELVIC LAPAROSCOPY  1990  . PERMANENT PACEMAKER GENERATOR CHANGE N/A 07/13/2014   MDT MRI compatible dual chamber pacemaker implanted by Dr Caryl Comes  . SPARK  2005  .  TUBAL LIGATION  2003     Family History  Problem Relation Age of Onset  . Hypertension Father   . Cancer Father 6       COLON  . Diabetes Father   . Breast cancer Paternal Aunt   . Hypertension Maternal Grandfather   . Diabetes Paternal Grandmother   . Breast cancer Paternal Grandmother   . Diabetes Paternal Grandfather   . Breast cancer Maternal Aunt        mastectomy  . Dementia Maternal Grandmother   . Breast cancer Paternal Aunt      Social History   Tobacco Use  . Smoking status: Never Smoker  . Smokeless tobacco: Never Used  Vaping Use  . Vaping Use: Never used  Substance Use Topics  . Alcohol use: No  . Drug use: No     Allergies  Allergen Reactions  . Toprol Xl [Metoprolol Succinate] Photosensitivity    Current Outpatient Medications  Medication Sig Dispense Refill  . buPROPion (WELLBUTRIN SR) 150 MG 12 hr tablet Take 1 tablet (150 mg total) by mouth 2 (two) times daily. 180 tablet 1  . Cyanocobalamin (VITAMIN B 12) 500 MCG TABS Take 1,000 mcg by mouth daily.    . cyclobenzaprine (FLEXERIL) 5 MG tablet Take by mouth.    . linaclotide (  LINZESS) 72 MCG capsule Take 1 capsule (72 mcg total) by mouth daily. 90 capsule 2  . LINZESS 145 MCG CAPS capsule TAKE 1 CAPSULE(145 MCG) BY MOUTH DAILY BEFORE BREAKFAST 30 capsule 6  . LORazepam (ATIVAN) 0.5 MG tablet TAKE 1 TABLET(0.5 MG) BY MOUTH DAILY AS NEEDED FOR ANXIETY 30 tablet 0  . meclizine (ANTIVERT) 25 MG tablet Take 1 tablet (25 mg total) by mouth 3 (three) times daily as needed for dizziness. 90 tablet 0  . meloxicam (MOBIC) 15 MG tablet Take 1 tablet (15 mg total) by mouth daily. 90 tablet 1  . metoprolol tartrate (LOPRESSOR) 50 MG tablet Take 1 tablet (50 mg total) by mouth daily. (Patient taking differently: Take 50 mg by mouth 2 (two) times daily.) 90 tablet 1  . Multiple Vitamins-Minerals (MULTIVITAMINS THER. W/MINERALS) TABS Take 1 tablet by mouth daily.    . ondansetron (ZOFRAN ODT) 4 MG disintegrating  tablet Take 1 tablet (4 mg total) by mouth every 8 (eight) hours as needed for nausea or vomiting. 60 tablet 0  . traMADol (ULTRAM) 50 MG tablet     . venlafaxine XR (EFFEXOR-XR) 150 MG 24 hr capsule Take 1 capsule (150 mg total) by mouth daily with breakfast. 90 capsule 1   No current facility-administered medications for this visit.      REVIEW OF SYSTEMS (Negative unless checked)  Constitutional: '[]' Weight loss  '[]' Fever  '[]' Chills Cardiac: '[x]' Chest pain   '[]' Chest pressure   '[x]' Palpitations   '[]' Shortness of breath when laying flat   '[]' Shortness of breath at rest   '[]' Shortness of breath with exertion. Vascular:  '[]' Pain in legs with walking   '[]' Pain in legs at rest   '[]' Pain in legs when laying flat   '[]' Claudication   '[]' Pain in feet when walking  '[]' Pain in feet at rest  '[]' Pain in feet when laying flat   '[]' History of DVT   '[]' Phlebitis   '[]' Swelling in legs   '[]' Varicose veins   '[]' Non-healing ulcers Pulmonary:   '[]' Uses home oxygen   '[]' Productive cough   '[]' Hemoptysis   '[]' Wheeze  '[]' COPD   '[]' Asthma Neurologic:  '[]' Dizziness  '[]' Blackouts   '[]' Seizures   '[]' History of stroke   '[]' History of TIA  '[]' Aphasia   '[]' Temporary blindness   '[]' Dysphagia   '[]' Weakness or numbness in arms   '[]' Weakness or numbness in legs Musculoskeletal:  '[]' Arthritis   '[]' Joint swelling   '[]' Joint pain   '[]' Low back pain Hematologic:  '[]' Easy bruising  '[]' Easy bleeding   '[]' Hypercoagulable state   '[]' Anemic  '[]' Hepatitis Gastrointestinal:  '[]' Blood in stool   '[]' Vomiting blood  '[]' Gastroesophageal reflux/heartburn   '[]' Abdominal pain Genitourinary:  '[]' Chronic kidney disease   '[]' Difficult urination  '[]' Frequent urination  '[]' Burning with urination   '[]' Hematuria Skin:  '[]' Rashes   '[]' Ulcers   '[]' Wounds Psychological:  '[]' History of anxiety   '[x]'  History of major depression.    Physical Exam BP 124/84 (BP Location: Right Arm)   Pulse 72   Resp 16   Ht 5' 4.5" (1.638 m)   Wt 168 lb 6.4 oz (76.4 kg)   BMI 28.46 kg/m  Gen:  WD/WN, NAD Head: [place]/AT, No  temporalis wasting.  Ear/Nose/Throat: Hearing grossly intact, nares w/o erythema or drainage, oropharynx w/o Erythema/Exudate Eyes: Conjunctiva clear, sclera non-icteric  Neck: trachea midline.  No JVD.  Pulmonary:  Good air movement, respirations not labored, no use of accessory muscles  Cardiac: RRR, no JVD Vascular:  Vessel Right Left  Radial Palpable Palpable  DP  2+  2+  PT  2+  2+   Gastrointestinal:. No masses, surgical incisions, or scars. Musculoskeletal: M/S 5/5 throughout.  Extremities without ischemic changes.  No deformity or atrophy.  Purplish discoloration is present in all the toes on both feet essentially.  No significant lower extremity edema. Neurologic: Sensation grossly intact in extremities.  Symmetrical.  Speech is fluent. Motor exam as listed above. Psychiatric: Judgment intact, Mood & affect appropriate for pt's clinical situation. Dermatologic: No rashes or ulcers noted.  No cellulitis or open wounds.    Radiology No results found.  Labs Recent Results (from the past 2160 hour(s))  CUP PACEART REMOTE DEVICE CHECK     Status: None   Collection Time: 05/10/20 10:55 AM  Result Value Ref Range   Date Time Interrogation Session [id number]    Pulse Generator Manufacturer MERM    Pulse Gen Model [id number] Advisa DR MRI    Pulse Gen Serial Number [id number]    Clinic Name [facility]    Implantable Pulse Generator Type Implantable Pulse Generator    Implantable Pulse Generator Implant Date [id number]    Implantable Lead Manufacturer [facility]    Implantable Lead Model 5076 CapSureFix Novus    Implantable Lead Serial Number [id number]    Implantable Lead Implant Date 84132440    Implantable Lead Location Detail 1 APPENDAGE    Implantable Lead Location [id number]    Implantable Lead Manufacturer [facility]    Implantable Lead Model 5076 CapSureFix Novus    Implantable Lead Serial Number [id number]    Implantable Lead Implant Date [id number]     Implantable Lead Location Detail 1 APEX    Implantable Lead Location [phone]    Lead Channel Setting Sensing Sensitivity 2 mV   Lead Channel Setting Pacing Amplitude 2 V   Lead Channel Setting Pacing Pulse Width 0.4 ms   Lead Channel Setting Pacing Amplitude 2.5 V   Lead Channel Impedance Value 418 ohm   Lead Channel Impedance Value 285 ohm   Lead Channel Sensing Intrinsic Amplitude 3.375 mV   Lead Channel Sensing Intrinsic Amplitude 3.375 mV   Lead Channel Pacing Threshold Amplitude 0.875 V   Lead Channel Pacing Threshold Pulse Width 0.4 ms   Lead Channel Impedance Value 456 ohm   Lead Channel Impedance Value 399 ohm   Lead Channel Sensing Intrinsic Amplitude 3.125 mV   Lead Channel Sensing Intrinsic Amplitude 3.125 mV   Lead Channel Pacing Threshold Amplitude 0.75 V   Lead Channel Pacing Threshold Pulse Width 0.4 ms   Battery Status OK    Battery Remaining Longevity 37 mo   Battery Voltage 2.97 V   Brady Statistic RA Percent Paced 18.58 %   Brady Statistic RV Percent Paced 99.91 %   Brady Statistic AP VP Percent 18.57 %   Brady Statistic AS VP Percent 81.36 %   Brady Statistic AP VS Percent 0.02 %   Brady Statistic AS VS Percent 0.04 %  CBC     Status: None   Collection Time: 06/27/20  3:19 PM  Result Value Ref Range   WBC 7.2 3.4 - 10.8 x10E3/uL   RBC 4.54 3.77 - 5.28 x10E6/uL   Hemoglobin 14.3 11.1 - 15.9 g/dL   Hematocrit 42.4 34.0 - 46.6 %   MCV 93 79 - 97 fL   MCH 31.5 26.6 - 33.0 pg   MCHC 33.7 31.5 - 35.7 g/dL   RDW 11.7 11.7 - 15.4 %   Platelets 291 150 - 450 x10E3/uL  Basic  Metabolic Panel (BMET)     Status: None   Collection Time: 06/27/20  3:19 PM  Result Value Ref Range   Glucose 78 65 - 99 mg/dL   BUN 17 6 - 24 mg/dL   Creatinine, Ser 0.82 0.57 - 1.00 mg/dL   GFR calc non Af Amer 84 >59 mL/min/1.73   GFR calc Af Amer 97 >59 mL/min/1.73    Comment: **In accordance with recommendations from the NKF-ASN Task force,**   [facility] is in the process of  updating its eGFR calculation to the   2021 CKD-EPI creatinine equation that estimates kidney function   without a race variable.    BUN/Creatinine Ratio 21 9 - 23   Sodium 140 134 - 144 mmol/L   Potassium 4.5 3.5 - 5.2 mmol/L   Chloride 102 96 - 106 mmol/L   CO2 24 20 - 29 mmol/L   Calcium 9.4 8.7 - 10.2 mg/dL  D-Dimer, Quantitative     Status: None   Collection Time: 06/27/20  3:19 PM  Result Value Ref Range   D-DIMER 0.29 0.00 - 0.49 mg/L FEU    Comment: According to the assay manufacturer's published package insert, a normal (<0.50 mg/L FEU) D-dimer result in conjunction with a non-high clinical probability assessment, excludes deep vein thrombosis (DVT) and pulmonary embolism (PE) with high sensitivity. D-dimer values increase with age and this can make VTE exclusion of an older population difficult. To address this, the [place], based on best available evidence and recent guidelines, recommends that clinicians use age-adjusted D-dimer thresholds in patients greater than 24 years of age with: a) a low probability of PE who do not meet all Pulmonary Embolism Rule Out Criteria, or b) in those with intermediate probability of PE. The formula for an age-adjusted D-dimer cut-off is "age/100". For example, a 50 year old patient would have an age-adjusted cut-off of 0.60 mg/L FEU and an 50 year old 0.80 mg/L FEU.   INR/PT     Status: None   Collection Time: 06/27/20  3:19 PM  Result Value Ref Range   INR 1.0 0.9 - 1.2    Comment: Reference interval is for non-anticoagulated patients. Suggested INR therapeutic range for Vitamin K antagonist therapy:    Standard Dose (moderate intensity                   therapeutic range):       2.0 - 3.0    Higher intensity therapeutic range       2.5 - 3.5    Prothrombin Time 10.2 9.1 - 12.0 sec  Fibrinogen     Status: None   Collection Time: 06/27/20  3:19 PM  Result Value Ref Range   Fibrinogen 404 193 - 507 mg/dL     Assessment/Plan:  Atrial tachycardia/SINUS tachycardia Status post pacemaker placement now.  Mitral valve disease Follows with cardiology.  Describes some right atrial enlargement and some tricuspid regurgitation.  Could certainly be contributing to some venous congestion and purplish discoloration of the feet and toes.  Blue toes We performed noninvasive studies today which showed normal triphasic ABIs bilaterally of 1.2 on the right and 1.3 on the left.  The digital tracings are somewhat dampened bilaterally.   Given these findings and her lack of atherosclerotic disease, this is less likely atheroembolic disease and more than likely some component of vasospasm like Raynaud's disease or some cardiac dysfunction creating some venous ingestion.  Either way, this is certainly not dangerous or requiring  any intervention at this time.  Her symptoms are fairly mild.  I do think the addition of an aspirin daily is a reasonable option given her previous cardiac history and new findings.  Avoiding cold stimulation is recommended.  Elevating her legs and continuing to be active is important.  Using compression socks may be of some benefit as well.  At this point, I will see the patient back on an as-needed basis unless her symptoms worsen.      Leotis Pain 06/30/2020, 9:33 AM   This note was created with Dragon medical transcription system.  Any errors from dictation are unintentional.

## 2020-06-30 NOTE — Assessment & Plan Note (Signed)
Follows with cardiology.  Describes some right atrial enlargement and some tricuspid regurgitation.  Could certainly be contributing to some venous congestion and purplish discoloration of the feet and toes.

## 2020-06-30 NOTE — Assessment & Plan Note (Signed)
Status post pacemaker placement now.

## 2020-06-30 NOTE — Patient Instructions (Signed)
 Raynaud Phenomenon  Raynaud phenomenon is a condition that affects the blood vessels (arteries) that carry blood to your fingers and toes. The arteries that supply blood to your ears, lips, nipples, or the tip of your nose might also be affected. Raynaud phenomenon causes the arteries to become narrow temporarily (spasm). As a result, the flow of blood to the affected areas is temporarily decreased. This usually occurs in response to cold temperatures or stress. During an attack, the skin in the affected areas turns white, then blue, and finally red. You may also feel tingling or numbness in those areas. Attacks usually last for only a brief period, and then the blood flow to the area returns to normal. In most cases, Raynaud phenomenon does not cause serious health problems. What are the causes? In many cases, the cause of this condition is not known. The condition may occur on its own (primary Raynaud phenomenon) or may be associated with other diseases or factors (secondary Raynaud phenomenon). Possible causes may include:  Diseases or medical conditions that damage the arteries.  Injuries and repetitive actions that hurt the hands or feet.  Being exposed to certain chemicals.  Taking medicines that narrow the arteries.  Other medical conditions, such as lupus, scleroderma, rheumatoid arthritis, thyroid problems, blood disorders, Sjogren syndrome, or atherosclerosis. What increases the risk? The following factors may make you more likely to develop this condition:  Being 20-40 years old.  Being female.  Having a family history of Raynaud phenomenon.  Living in a cold climate.  Smoking. What are the signs or symptoms? Symptoms of this condition usually occur when you are exposed to cold temperatures or when you have emotional stress. The symptoms may last for a few minutes or up to several hours. They usually affect your fingers but may also affect your toes, nipples, lips, ears,  or the tip of your nose. Symptoms may include:  Changes in skin color. The skin in the affected areas will turn pale or white. The skin may then change from white to bluish to red as normal blood flow returns to the area.  Numbness, tingling, or pain in the affected areas. In severe cases, symptoms may include:  Skin sores.  Tissues decaying and dying (gangrene). How is this diagnosed? This condition may be diagnosed based on:  Your symptoms and medical history.  A physical exam. During the exam, you may be asked to put your hands in cold water to check for a reaction to cold temperature.  Tests, such as: ? Blood tests to check for other diseases or conditions. ? A test to check the movement of blood through your arteries and veins (vascular ultrasound). ? A test in which the skin at the base of your fingernail is examined under a microscope (nailfold capillaroscopy). How is this treated? Treatment for this condition often involves making lifestyle changes and taking steps to control your exposure to cold temperatures. For more severe cases, medicine (calcium channel blockers) may be used to improve blood flow. Surgery is sometimes done to block the nerves that control the affected arteries, but this is rare. Follow these instructions at home: Avoiding cold temperatures Take these steps to avoid exposure to cold:  If possible, stay indoors during cold weather.  When you go outside during cold weather, dress in layers and wear mittens, a hat, a scarf, and warm footwear.  Wear mittens or gloves when handling ice or frozen food.  Use holders for glasses or cans containing cold drinks.    Let warm water run for a while before taking a shower or bath.  Warm up the car before driving in cold weather. Lifestyle  If possible, avoid stressful and emotional situations. Try to find ways to manage your stress, such as: ? Exercise. ? Yoga. ? Meditation. ? Biofeedback.  Do not use any  products that contain nicotine or tobacco, such as cigarettes and e-cigarettes. If you need help quitting, ask your health care provider.  Avoid secondhand smoke.  Limit your use of caffeine. ? Switch to decaffeinated coffee, tea, and soda. ? Avoid chocolate.  Avoid vibrating tools and machinery. General instructions  Protect your hands and feet from injuries, cuts, or bruises.  Avoid wearing tight rings or wristbands.  Wear loose fitting socks and comfortable, roomy shoes.  Take over-the-counter and prescription medicines only as told by your health care provider. Contact a health care provider if:  Your discomfort becomes worse despite lifestyle changes.  You develop sores on your fingers or toes that do not heal.  Your fingers or toes turn black.  You have breaks in the skin on your fingers or toes.  You have a fever.  You have pain or swelling in your joints.  You have a rash.  Your symptoms occur on only one side of your body. Summary  Raynaud phenomenon is a condition that affects the arteries that carry blood to your fingers, toes, ears, lips, nipples, or the tip of your nose.  In many cases, the cause of this condition is not known.  Symptoms of this condition include changes in skin color, and numbness and tingling of the affected area.  Treatment for this condition includes lifestyle changes, reducing exposure to cold temperatures, and using medicines for severe cases of the condition.  Contact your health care provider if your condition worsens despite treatment. This information is not intended to replace advice given to you by your health care provider. Make sure you discuss any questions you have with your health care provider. Document Revised: 09/30/2019 Document Reviewed: 09/30/2019 Elsevier Patient Education  2021 [employer]vier Inc.  

## 2020-07-10 ENCOUNTER — Telehealth: Payer: Self-pay

## 2020-07-10 ENCOUNTER — Ambulatory Visit (INDEPENDENT_AMBULATORY_CARE_PROVIDER_SITE_OTHER): Payer: Medicare PPO | Admitting: Family Medicine

## 2020-07-10 ENCOUNTER — Other Ambulatory Visit (HOSPITAL_COMMUNITY)
Admission: RE | Admit: 2020-07-10 | Discharge: 2020-07-10 | Disposition: A | Discharge status: Home / Self Care | Payer: Medicare PPO | Source: Ambulatory Visit | Attending: Family Medicine | Admitting: Family Medicine

## 2020-07-10 ENCOUNTER — Other Ambulatory Visit: Payer: Self-pay

## 2020-07-10 ENCOUNTER — Ambulatory Visit: Payer: Medicare PPO

## 2020-07-10 ENCOUNTER — Encounter: Payer: Self-pay | Admitting: Family Medicine

## 2020-07-10 VITALS — BP 120/86 | HR 77 | Temp 98.5°F | Ht 64.06 in | Wt 168.1 lb

## 2020-07-10 DIAGNOSIS — M47812 Spondylosis without myelopathy or radiculopathy, cervical region: Secondary | ICD-10-CM | POA: Diagnosis not present

## 2020-07-10 DIAGNOSIS — Z0000 Encounter for general adult medical examination without abnormal findings: Secondary | ICD-10-CM | POA: Diagnosis not present

## 2020-07-10 DIAGNOSIS — Z1159 Encounter for screening for other viral diseases: Secondary | ICD-10-CM | POA: Diagnosis not present

## 2020-07-10 DIAGNOSIS — F339 Major depressive disorder, recurrent, unspecified: Secondary | ICD-10-CM

## 2020-07-10 DIAGNOSIS — Z1231 Encounter for screening mammogram for malignant neoplasm of breast: Secondary | ICD-10-CM

## 2020-07-10 DIAGNOSIS — Z136 Encounter for screening for cardiovascular disorders: Secondary | ICD-10-CM | POA: Diagnosis not present

## 2020-07-10 DIAGNOSIS — Z124 Encounter for screening for malignant neoplasm of cervix: Secondary | ICD-10-CM | POA: Insufficient documentation

## 2020-07-10 DIAGNOSIS — I498 Other specified cardiac arrhythmias: Secondary | ICD-10-CM

## 2020-07-10 DIAGNOSIS — G90A Postural orthostatic tachycardia syndrome (POTS): Secondary | ICD-10-CM

## 2020-07-10 DIAGNOSIS — Z1151 Encounter for screening for human papillomavirus (HPV): Secondary | ICD-10-CM | POA: Diagnosis not present

## 2020-07-10 DIAGNOSIS — F3342 Major depressive disorder, recurrent, in full remission: Secondary | ICD-10-CM | POA: Diagnosis not present

## 2020-07-10 DIAGNOSIS — F3289 Other specified depressive episodes: Secondary | ICD-10-CM | POA: Diagnosis not present

## 2020-07-10 LAB — URINALYSIS, ROUTINE W REFLEX MICROSCOPIC
Bilirubin, UA: NEGATIVE
Glucose, UA: NEGATIVE
Ketones, UA: NEGATIVE
Leukocytes,UA: NEGATIVE
Nitrite, UA: NEGATIVE
Protein,UA: NEGATIVE
RBC, UA: NEGATIVE
Specific Gravity, UA: >1.03 — ABNORMAL HIGH (ref 1.005–1.030)
Urobilinogen, Ur: 0.2 mg/dL (ref 0.2–1.0)
pH, UA: 5.5 (ref 5.0–7.5)

## 2020-07-10 MED ORDER — LORAZEPAM 0.5 MG PO TABS
ORAL_TABLET | ORAL | 0 refills | Status: DC
Start: 2020-07-10 — End: 2021-05-22

## 2020-07-10 MED ORDER — BUPROPION HCL ER (SR) 200 MG PO TB12
200.0000 mg | ORAL_TABLET | Freq: Two times a day (BID) | ORAL | 1 refills | Status: DC
Start: 2020-07-10 — End: 2020-08-08

## 2020-07-10 MED ORDER — METOPROLOL TARTRATE 50 MG PO TABS: 50.0000 mg | ORAL_TABLET | Freq: Two times a day (BID) | ORAL | 1 refills | Status: DC

## 2020-07-10 MED ORDER — VENLAFAXINE HCL ER 150 MG PO CP24: 150.0000 mg | ORAL_CAPSULE | Freq: Every day | ORAL | 1 refills | Status: DC

## 2020-07-10 MED ORDER — MELOXICAM 15 MG PO TABS: 15.0000 mg | ORAL_TABLET | Freq: Every day | ORAL | 1 refills | Status: DC

## 2020-07-10 NOTE — Assessment & Plan Note (Signed)
Following with neurosurgery. Continue to monitor. Call with any concerns.

## 2020-07-10 NOTE — Assessment & Plan Note (Signed)
Under good control on current regimen. Continue current regimen. Continue to monitor. Call with any concerns. Refills given.   

## 2020-07-10 NOTE — Assessment & Plan Note (Signed)
Not doing well right now. Will increase her wellbutrin to 200mg  BID. Continue effexor. Call with any concerns.

## 2020-07-10 NOTE — Patient Instructions (Addendum)
Health Maintenance, Female Adopting a healthy lifestyle and getting preventive care are important in promoting health and wellness. Ask your health care provider about:  The right schedule for you to have regular tests and exams.  Things you can do on your own to prevent diseases and keep yourself healthy. What should I know about diet, weight, and exercise? Eat a healthy diet  Eat a diet that includes plenty of vegetables, fruits, low-fat dairy products, and lean protein.  Do not eat a lot of foods that are high in solid fats, added sugars, or sodium.   Maintain a healthy weight Body mass index (BMI) is used to identify weight problems. It estimates body fat based on height and weight. Your health care provider can help determine your BMI and help you achieve or maintain a healthy weight. Get regular exercise Get regular exercise. This is one of the most important things you can do for your health. Most adults should:  Exercise for at least 150 minutes each week. The exercise should increase your heart rate and make you sweat (moderate-intensity exercise).  Do strengthening exercises at least twice a week. This is in addition to the moderate-intensity exercise.  Spend less time sitting. Even light physical activity can be beneficial. Watch cholesterol and blood lipids Have your blood tested for lipids and cholesterol at 50 years of age, then have this test every 5 years. Have your cholesterol levels checked more often if:  Your lipid or cholesterol levels are high.  You are older than 50 years of age.  You are at high risk for heart disease. What should I know about cancer screening? Depending on your health history and family history, you may need to have cancer screening at various ages. This may include screening for:  Breast cancer.  Cervical cancer.  Colorectal cancer.  Skin cancer.  Lung cancer. What should I know about heart disease, diabetes, and high blood  pressure? Blood pressure and heart disease  High blood pressure causes heart disease and increases the risk of stroke. This is more likely to develop in people who have high blood pressure readings, are of African descent, or are overweight.  Have your blood pressure checked: ? Every 3-5 years if you are 18-39 years of age. ? Every year if you are 40 years old or older. Diabetes Have regular diabetes screenings. This checks your fasting blood sugar level. Have the screening done:  Once every three years after age 40 if you are at a normal weight and have a low risk for diabetes.  More often and at a younger age if you are overweight or have a high risk for diabetes. What should I know about preventing infection? Hepatitis B If you have a higher risk for hepatitis B, you should be screened for this virus. Talk with your health care provider to find out if you are at risk for hepatitis B infection. Hepatitis C Testing is recommended for:  Everyone born from 1945 through 1965.  Anyone with known risk factors for hepatitis C. Sexually transmitted infections (STIs)  Get screened for STIs, including gonorrhea and chlamydia, if: ? You are sexually active and are younger than 50 years of age. ? You are older than 50 years of age and your health care provider tells you that you are at risk for this type of infection. ? Your sexual activity has changed since you were last screened, and you are at increased risk for chlamydia or gonorrhea. Ask your health care provider   if you are at risk.  Ask your health care provider about whether you are at high risk for HIV. Your health care provider may recommend a prescription medicine to help prevent HIV infection. If you choose to take medicine to prevent HIV, you should first get tested for HIV. You should then be tested every 3 months for as long as you are taking the medicine. Pregnancy  If you are about to stop having your period (premenopausal) and  you may become pregnant, seek counseling before you get pregnant.  Take 400 to 800 micrograms (mcg) of folic acid every day if you become pregnant.  Ask for birth control (contraception) if you want to prevent pregnancy. Osteoporosis and menopause Osteoporosis is a disease in which the bones lose minerals and strength with aging. This can result in bone fractures. If you are 65 years old or older, or if you are at risk for osteoporosis and fractures, ask your health care provider if you should:  Be screened for bone loss.  Take a calcium or vitamin D supplement to lower your risk of fractures.  Be given hormone replacement therapy (HRT) to treat symptoms of menopause. Follow these instructions at home: Lifestyle  Do not use any products that contain nicotine or tobacco, such as cigarettes, e-cigarettes, and chewing tobacco. If you need help quitting, ask your health care provider.  Do not use street drugs.  Do not share needles.  Ask your health care provider for help if you need support or information about quitting drugs. Alcohol use  Do not drink alcohol if: ? Your health care provider tells you not to drink. ? You are pregnant, may be pregnant, or are planning to become pregnant.  If you drink alcohol: ? Limit how much you use to 0-1 drink a day. ? Limit intake if you are breastfeeding.  Be aware of how much alcohol is in your drink. In the [place] one drink equals one 12 oz bottle of beer (355 mL), one 5 oz glass of wine (148 mL), or one 1 oz glass of hard liquor (44 mL). General instructions  Schedule regular health, dental, and eye exams.  Stay current with your vaccines.  Tell your health care provider if: ? You often feel depressed. ? You have ever been abused or do not feel safe at home. Summary  Adopting a healthy lifestyle and getting preventive care are important in promoting health and wellness.  Follow your health care provider's instructions about healthy  diet, exercising, and getting tested or screened for diseases.  Follow your health care provider's instructions on monitoring your cholesterol and blood pressure. This information is not intended to replace advice given to you by your health care provider. Make sure you discuss any questions you have with your health care provider. Document Revised: 05/13/2018 Document Reviewed: 05/13/2018 Elsevier Patient Education  2021 Elsevier Inc.  

## 2020-07-10 NOTE — Progress Notes (Signed)
BP 120/86   Pulse 77   Temp 98.5 F (36.9 C)   Ht 5' 4.06" (1.627 m)   Wt 168 lb 2 oz (76.3 kg)   SpO2 99%   BMI 28.81 kg/m    Subjective:    Patient ID: Angela [name], female    DOB: 07/31/1970, 50 y.o.   MRN: [id number]  HPI: Angela [name] is a 50 y.o. female presenting on 07/10/2020 for comprehensive medical examination. Current medical complaints include:  Has been hurting most of the time. She is going for an injection later this week. Her neurosurgeon is not optimistic and is thinking that she is going to need surgery. If she does, she's going to do her foot at the same time.  ANXIETY/DEPRESSION Duration: chronic Status:exacerbated Anxious mood: yes  Excessive worrying: no Irritability: no  Sweating: no Nausea: no Palpitations:no Hyperventilation: no Panic attacks: no Agoraphobia: no  Obscessions/compulsions: no Depressed mood: yes Depression screen [facility] 2/9 06/27/2020 03/24/2020 01/17/2020 07/09/2019 07/08/2019  Decreased Interest 0 0 1 1 0  Down, Depressed, Hopeless 1 0 0 1 0  PHQ - 2 Score 1 0 1 2 0  Altered sleeping 3 0 2 2 -  Tired, decreased energy 3 0 1 1 -  Change in appetite 0 0 1 1 -  Feeling bad or failure about yourself  0 0 0 0 -  Trouble concentrating 3 0 2 1 -  Moving slowly or fidgety/restless 0 0 0 1 -  Suicidal thoughts 0 0 0 0 -  PHQ-9 Score 10 0 7 8 -  Difficult doing work/chores Not difficult at all Not difficult at all Somewhat difficult Somewhat difficult -  Some recent data might be hidden   Anhedonia: no Weight changes: no Insomnia: yes hard to fall asleep  Hypersomnia: no Fatigue/loss of energy: yes Feelings of worthlessness: no Feelings of guilt: yes Impaired concentration/indecisiveness: yes Suicidal ideations: no  Crying spells: yes Recent Stressors/Life Changes: yes   Relationship problems: no   Family stress: no     Financial stress: no    Job stress: no    Recent death/loss: no    She currently lives with:  husband and kids Menopausal Symptoms: yes  Depression Screen done today and results listed below:  Depression screen [facility] 2/9 06/27/2020 03/24/2020 01/17/2020 07/09/2019 07/08/2019  Decreased Interest 0 0 1 1 0  Down, Depressed, Hopeless 1 0 0 1 0  PHQ - 2 Score 1 0 1 2 0  Altered sleeping 3 0 2 2 -  Tired, decreased energy 3 0 1 1 -  Change in appetite 0 0 1 1 -  Feeling bad or failure about yourself  0 0 0 0 -  Trouble concentrating 3 0 2 1 -  Moving slowly or fidgety/restless 0 0 0 1 -  Suicidal thoughts 0 0 0 0 -  PHQ-9 Score 10 0 7 8 -  Difficult doing work/chores Not difficult at all Not difficult at all Somewhat difficult Somewhat difficult -  Some recent data might be hidden    Past Medical History:  Past Medical History:  Diagnosis Date  . 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 Chest pain    a cath 2/10. EF 50-55% normal coronaries  . Complete heart block ([place])   . Depression   . Dysautonomia ([place])   . Dyspnea    CT negative for PE, 2007  . Hypertension   . Insomnia   . OCD (  obsessive compulsive disorder)   . SVT (supraventricular tachycardia) (H[facility])    s/p ablation a. c/b AV nod ablation requiring pacemaker    Surgical History:  Past Surgical History:  Procedure Laterality Date  . BREAST BIOPSY Right 1998  . BREAST BIOPSY Right 2004  . BREAST LUMPECTOMY     RIGHT -BREAST   . COLONOSCOPY WITH PROPOFOL N/A 09/07/2019   Procedure: COLONOSCOPY WITH PROPOFOL;  Surgeon: Lucilla Lame, MD;  Location: [facility] ENDOSCOPY;  Service: Endoscopy;  Laterality: N/A;  . ENDOMETRIAL ABLATION  2005  . INSERT / REPLACE / REMOVE PACEMAKER    . PELVIC LAPAROSCOPY  1990  . PERMANENT PACEMAKER GENERATOR CHANGE N/A 07/13/2014   MDT MRI compatible dual chamber pacemaker implanted by Dr Caryl Comes  . SPARK  2005  . TUBAL LIGATION  2003    Medications:  Current Outpatient Medications on File Prior to Visit  Medication Sig  . Cyanocobalamin (VITAMIN B 12) 500 MCG TABS Take 1,000  mcg by mouth daily.  . cyclobenzaprine (FLEXERIL) 5 MG tablet Take by mouth.  . linaclotide (LINZESS) 72 MCG capsule Take 1 capsule (72 mcg total) by mouth daily.  Marland Kitchen LINZESS 145 MCG CAPS capsule TAKE 1 CAPSULE(145 MCG) BY MOUTH DAILY BEFORE BREAKFAST  . meclizine (ANTIVERT) 25 MG tablet Take 1 tablet (25 mg total) by mouth 3 (three) times daily as needed for dizziness.  . Multiple Vitamins-Minerals (MULTIVITAMINS THER. W/MINERALS) TABS Take 1 tablet by mouth daily.  . ondansetron (ZOFRAN ODT) 4 MG disintegrating tablet Take 1 tablet (4 mg total) by mouth every 8 (eight) hours as needed for nausea or vomiting.  . traMADol (ULTRAM) 50 MG tablet    No current facility-administered medications on file prior to visit.    Allergies:  Allergies  Allergen Reactions  . Toprol Xl [Metoprolol Succinate] Photosensitivity    Social History:  Social History   Socioeconomic History  . Marital status: Married    Spouse name: Not on file  . Number of children: Not on file  . Years of education: Not on file  . Highest education level: Bachelor's degree (e.g., BA, AB, BS)  Occupational History  . Occupation: disability   Tobacco Use  . Smoking status: Never Smoker  . Smokeless tobacco: Never Used  Vaping Use  . Vaping Use: Never used  Substance and Sexual Activity  . Alcohol use: No  . Drug use: No  . Sexual activity: Yes    Birth control/protection: Surgical    Comment: Uterine ablarion 2002  Other Topics Concern  . Not on file  Social History Narrative   Work with daughters school    Social Determinants of Health   Financial Resource Strain: Not on file  Food Insecurity: Not on file  Transportation Needs: Not on file  Physical Activity: Not on file  Stress: Not on file  Social Connections: Not on file  Intimate Partner Violence: Not on file   Social History   Tobacco Use  Smoking Status Never Smoker  Smokeless Tobacco Never Used   Social History   Substance and Sexual  Activity  Alcohol Use No    Family History:  Family History  Problem Relation Age of Onset  . Hypertension Father   . Cancer Father 21       COLON  . Diabetes Father   . Breast cancer Paternal Aunt   . Cancer Paternal Aunt   . Hypertension Maternal Grandfather   . Diabetes Paternal Grandmother   . Breast cancer Paternal Grandmother   .  Arthritis Paternal Grandmother   . Cancer Paternal Grandmother   . Diabetes Paternal Grandfather   . Cancer Paternal Grandfather   . Breast cancer Maternal Aunt        mastectomy  . Cancer Maternal Aunt   . Anxiety disorder Mother   . Arthritis Mother   . Obesity Mother   . Dementia Maternal Grandmother   . Breast cancer Paternal Aunt   . Anxiety disorder Sister     Past medical history, surgical history, medications, allergies, family history and social history reviewed with patient today and changes made to appropriate areas of the chart.   Review of Systems  Constitutional: Positive for diaphoresis and malaise/fatigue. Negative for chills, fever and weight loss.  HENT: Negative.   Eyes: Negative.        Issues seeing up close  Respiratory: Negative.   Cardiovascular: Positive for palpitations. Negative for chest pain, orthopnea, claudication, leg swelling and PND.  Gastrointestinal: Positive for constipation. Negative for abdominal pain, blood in stool, diarrhea, heartburn, melena, nausea and vomiting.  Genitourinary: Negative.   Musculoskeletal: Positive for myalgias and neck pain. Negative for back pain, falls and joint pain.  Skin: Negative.   Neurological: Positive for dizziness and weakness. Negative for tingling, tremors, sensory change, speech change, focal weakness, seizures, loss of consciousness and headaches.  Endo/Heme/Allergies: Negative.   Psychiatric/Behavioral: Positive for depression. Negative for hallucinations, memory loss, substance abuse and suicidal ideas. The patient is nervous/anxious and has insomnia.    All  other ROS negative except what is listed above and in the HPI.      Objective:    BP 120/86   Pulse 77   Temp 98.5 F (36.9 C)   Ht 5' 4.06" (1.627 m)   Wt 168 lb 2 oz (76.3 kg)   SpO2 99%   BMI 28.81 kg/m   Wt Readings from Last 3 Encounters:  07/10/20 168 lb 2 oz (76.3 kg)  06/30/20 168 lb 6.4 oz (76.4 kg)  06/27/20 165 lb (74.8 kg)    Physical Exam Vitals and nursing note reviewed. Exam conducted with a chaperone present.  Constitutional:      General: She is not in acute distress.    Appearance: Normal appearance. She is not ill-appearing, toxic-appearing or diaphoretic.  HENT:     Head: Normocephalic and atraumatic.     Right Ear: Tympanic membrane, ear canal and external ear normal. There is no impacted cerumen.     Left Ear: Tympanic membrane, ear canal and external ear normal. There is no impacted cerumen.     Nose: Nose normal. No congestion or rhinorrhea.     Mouth/Throat:     Mouth: Mucous membranes are moist.     Pharynx: Oropharynx is clear. No oropharyngeal exudate or posterior oropharyngeal erythema.  Eyes:     General: No scleral icterus.       Right eye: No discharge.        Left eye: No discharge.     Extraocular Movements: Extraocular movements intact.     Conjunctiva/sclera: Conjunctivae normal.     Pupils: Pupils are equal, round, and reactive to light.  Neck:     Vascular: No carotid bruit.  Cardiovascular:     Rate and Rhythm: Normal rate and regular rhythm.     Pulses: Normal pulses.     Heart sounds: No murmur heard. No friction rub. No gallop.   Pulmonary:     Effort: Pulmonary effort is normal. No respiratory distress.  Breath sounds: Normal breath sounds. No stridor. No wheezing, rhonchi or rales.  Chest:     Chest wall: No tenderness.  Breasts:     Right: Normal. No swelling, bleeding, inverted nipple, mass, nipple discharge, skin change or tenderness.     Left: Normal. No swelling, bleeding, inverted nipple, mass, nipple discharge,  skin change or tenderness.    Abdominal:     General: Abdomen is flat. Bowel sounds are normal. There is no distension.     Palpations: Abdomen is soft. There is no mass.     Tenderness: There is no abdominal tenderness. There is no right CVA tenderness, left CVA tenderness, guarding or rebound.     Hernia: No hernia is present.  Genitourinary:    Labia:        Right: No rash, tenderness, lesion or injury.        Left: No rash, tenderness, lesion or injury.      Urethra: No prolapse or urethral pain.     Vagina: Normal.     Cervix: Normal.     Uterus: Normal.      Adnexa: Right adnexa normal and left adnexa normal.  Musculoskeletal:        General: No swelling, tenderness, deformity or signs of injury.     Cervical back: Normal range of motion and neck supple. No rigidity. No muscular tenderness.     Right lower leg: No edema.     Left lower leg: No edema.  Lymphadenopathy:     Cervical: No cervical adenopathy.  Skin:    General: Skin is warm and dry.     Capillary Refill: Capillary refill takes less than 2 seconds.     Coloration: Skin is not jaundiced or pale.     Findings: No bruising, erythema, lesion or rash.  Neurological:     General: No focal deficit present.     Mental Status: She is alert and oriented to person, place, and time. Mental status is at baseline.     Cranial Nerves: No cranial nerve deficit.     Sensory: No sensory deficit.     Motor: No weakness.     Coordination: Coordination normal.     Gait: Gait normal.     Deep Tendon Reflexes: Reflexes normal.  Psychiatric:        Mood and Affect: Mood normal.        Behavior: Behavior normal.        Thought Content: Thought content normal.        Judgment: Judgment normal.     Results for orders placed or performed in visit on 06/27/20  CBC  Result Value Ref Range   WBC 7.2 3.4 - 10.8 x10E3/uL   RBC 4.54 3.77 - 5.28 x10E6/uL   Hemoglobin 14.3 11.1 - 15.9 g/dL   Hematocrit 42.4 34.0 - 46.6 %   MCV 93  79 - 97 fL   MCH 31.5 26.6 - 33.0 pg   MCHC 33.7 31.5 - 35.7 g/dL   RDW 11.7 11.7 - 15.4 %   Platelets 291 150 - 450 [id number]/JI  Basic Metabolic Panel (BMET)  Result Value Ref Range   Glucose 78 65 - 99 mg/dL   BUN 17 6 - 24 mg/dL   Creatinine, Ser 0.82 0.57 - 1.00 mg/dL   GFR calc non Af Amer 84 >59 mL/min/1.73   GFR calc Af Amer 97 >59 mL/min/1.73   BUN/Creatinine Ratio 21 9 - 23   Sodium 140 134 - 144 mmol/L  Potassium 4.5 3.5 - 5.2 mmol/L   Chloride 102 96 - 106 mmol/L   CO2 24 20 - 29 mmol/L   Calcium 9.4 8.7 - 10.2 mg/dL  D-Dimer, Quantitative  Result Value Ref Range   D-DIMER 0.29 0.00 - 0.49 mg/L FEU  INR/PT  Result Value Ref Range   INR 1.0 0.9 - 1.2   Prothrombin Time 10.2 9.1 - 12.0 sec  Fibrinogen  Result Value Ref Range   Fibrinogen 404 193 - 507 mg/dL      Assessment & Plan:   Problem List Items Addressed This Visit      Cardiovascular and Mediastinum   POTS (postural orthostatic tachycardia syndrome)    Under good control on current regimen. Continue current regimen. Continue to monitor. Call with any concerns. Refills given.        Relevant Medications   metoprolol tartrate (LOPRESSOR) 50 MG tablet     Musculoskeletal and Integument   Cervical spondylosis    Following with neurosurgery. Continue to monitor. Call with any concerns.       Relevant Medications   meloxicam (MOBIC) 15 MG tablet     Other   Depression, recurrent ([place])    Not doing well right now. Will increase her wellbutrin to 200mg  BID. Continue effexor. Call with any concerns.       Relevant Medications   venlafaxine XR (EFFEXOR-XR) 150 MG 24 hr capsule   buPROPion (WELLBUTRIN SR) 200 MG 12 hr tablet   LORazepam (ATIVAN) 0.5 MG tablet    Other Visit Diagnoses    Routine general medical examination at a health care facility    -  Primary   Vaccines up to date. Screening labs checked today. Pap done. Mammogram ordered. Colonoscopy up to date. Continue diet and exercise. Call  with any concerns.    Relevant Orders   CBC with Differential/Platelet   Comprehensive metabolic panel   Lipid Panel w/o Chol/HDL Ratio   Urinalysis, Routine w reflex microscopic   TSH   Recurrent major depressive disorder, in full remission (HCC)       Relevant Medications   venlafaxine XR (EFFEXOR-XR) 150 MG 24 hr capsule   buPROPion (WELLBUTRIN SR) 200 MG 12 hr tablet   LORazepam (ATIVAN) 0.5 MG tablet   Encounter for hepatitis C screening test for low risk patient       Labs drawn today. Await results.    Relevant Orders   Hepatitis C antibody   Screening for cervical cancer       Pap done today.    Relevant Orders   Cytology - PAP   Encounter for screening mammogram for malignant neoplasm of breast       Mammogram ordered today.   Relevant Orders   MM 3D SCREEN BREAST BILATERAL   Other depression       Relevant Medications   venlafaxine XR (EFFEXOR-XR) 150 MG 24 hr capsule   buPROPion (WELLBUTRIN SR) 200 MG 12 hr tablet   LORazepam (ATIVAN) 0.5 MG tablet       Follow up plan: Return in about 4 weeks (around 08/07/2020) for follow up mood- virtual OK.   LABORATORY TESTING:  - Pap smear: pap done  IMMUNIZATIONS:   - Tdap: Tetanus vaccination status reviewed: last tetanus booster within 10 years. - Influenza: Up to date - COVID: Up to date  SCREENING: -Mammogram: Up to date  - Colonoscopy: Up to date   PATIENT COUNSELING:   Advised to take 1 mg of folate supplement per  day if capable of pregnancy.   Sexuality: Discussed sexually transmitted diseases, partner selection, use of condoms, avoidance of unintended pregnancy  and contraceptive alternatives.   Advised to avoid cigarette smoking.  I discussed with the patient that most people either abstain from alcohol or drink within safe limits (<=14/week and <=4 drinks/occasion for males, <=7/weeks and <= 3 drinks/occasion for females) and that the risk for alcohol disorders and other health effects rises  proportionally with the number of drinks per week and how often a drinker exceeds daily limits.  Discussed cessation/primary prevention of drug use and availability of treatment for abuse.   Diet: Encouraged to adjust caloric intake to maintain  or achieve ideal body weight, to reduce intake of dietary saturated fat and total fat, to limit sodium intake by avoiding high sodium foods and not adding table salt, and to maintain adequate dietary potassium and calcium preferably from fresh fruits, vegetables, and low-fat dairy products.    stressed the importance of regular exercise  Injury prevention: Discussed safety belts, safety helmets, smoke detector, smoking near bedding or upholstery.   Dental health: Discussed importance of regular tooth brushing, flossing, and dental visits.    NEXT PREVENTATIVE PHYSICAL DUE IN 1 YEAR. Return in about 4 weeks (around 08/07/2020) for follow up mood- virtual OK.

## 2020-07-10 NOTE — Telephone Encounter (Signed)
Pt had to cancel apt for today 07/10/2020 due to attending funeral . Please reach out to pt to reschedule this apt.

## 2020-07-11 LAB — COMPREHENSIVE METABOLIC PANEL
ALT: 19 IU/L (ref 0–32)
AST: 22 IU/L (ref 0–40)
Albumin/Globulin Ratio: 2.3 — ABNORMAL HIGH (ref 1.2–2.2)
Albumin: 4.3 g/dL (ref 3.8–4.8)
Alkaline Phosphatase: 86 IU/L (ref 44–121)
BUN/Creatinine Ratio: 19 (ref 9–23)
BUN: 15 mg/dL (ref 6–24)
Bilirubin Total: 0.3 mg/dL (ref 0.0–1.2)
CO2: 23 mmol/L (ref 20–29)
Calcium: 9.4 mg/dL (ref 8.7–10.2)
Chloride: 100 mmol/L (ref 96–106)
Creatinine, Ser: 0.81 mg/dL (ref 0.57–1.00)
GFR calc Af Amer: 99 mL/min/{1.73_m2} (ref >59)
GFR calc non Af Amer: 86 mL/min/{1.73_m2} (ref >59)
Globulin, Total: 1.9 g/dL (ref 1.5–4.5)
Glucose: 80 mg/dL (ref 65–99)
Potassium: 4.2 mmol/L (ref 3.5–5.2)
Sodium: 137 mmol/L (ref 134–144)
Total Protein: 6.2 g/dL (ref 6.0–8.5)

## 2020-07-11 LAB — CBC WITH DIFFERENTIAL/PLATELET
Basophils Absolute: 0 10*3/uL (ref 0.0–0.2)
Basos: 0 %
EOS (ABSOLUTE): 0 10*3/uL (ref 0.0–0.4)
Eos: 1 %
Hematocrit: 41.5 % (ref 34.0–46.6)
Hemoglobin: 13.8 g/dL (ref 11.1–15.9)
Immature Grans (Abs): 0 10*3/uL (ref 0.0–0.1)
Immature Granulocytes: 0 %
Lymphocytes Absolute: 1.8 10*3/uL (ref 0.7–3.1)
Lymphs: 26 %
MCH: 31.4 pg (ref 26.6–33.0)
MCHC: 33.3 g/dL (ref 31.5–35.7)
MCV: 94 fL (ref 79–97)
Monocytes Absolute: 0.5 10*3/uL (ref 0.1–0.9)
Monocytes: 8 %
Neutrophils Absolute: 4.7 10*3/uL (ref 1.4–7.0)
Neutrophils: 65 %
Platelets: 293 10*3/uL (ref 150–450)
RBC: 4.4 x10E6/uL (ref 3.77–5.28)
RDW: 11.7 % (ref 11.7–15.4)
WBC: 7.2 10*3/uL (ref 3.4–10.8)

## 2020-07-11 LAB — TSH: TSH: 1.15 u[IU]/mL (ref 0.450–4.500)

## 2020-07-11 LAB — LIPID PANEL W/O CHOL/HDL RATIO
Cholesterol, Total: 195 mg/dL (ref 100–199)
HDL: 76 mg/dL (ref >39)
LDL Chol Calc (NIH): 102 mg/dL — ABNORMAL HIGH (ref 0–99)
Triglycerides: 94 mg/dL (ref 0–149)
VLDL Cholesterol Cal: 17 mg/dL (ref 5–40)

## 2020-07-11 LAB — HEPATITIS C ANTIBODY: Hep C Virus Ab: 0.1 s/co ratio (ref 0.0–0.9)

## 2020-07-12 LAB — CYTOLOGY - PAP
Adequacy: ABSENT
Comment: NEGATIVE
Diagnosis: NEGATIVE
High risk HPV: NEGATIVE

## 2020-07-13 DIAGNOSIS — M5412 Radiculopathy, cervical region: Secondary | ICD-10-CM | POA: Diagnosis not present

## 2020-07-19 DIAGNOSIS — M79676 Pain in unspecified toe(s): Secondary | ICD-10-CM

## 2020-07-21 ENCOUNTER — Other Ambulatory Visit: Payer: Self-pay

## 2020-07-21 ENCOUNTER — Ambulatory Visit (INDEPENDENT_AMBULATORY_CARE_PROVIDER_SITE_OTHER): Payer: Medicare PPO

## 2020-07-21 ENCOUNTER — Encounter: Payer: Self-pay | Admitting: Podiatry

## 2020-07-21 ENCOUNTER — Ambulatory Visit: Payer: Medicare PPO | Admitting: Podiatry

## 2020-07-21 VITALS — Ht 63.5 in | Wt 162.0 lb

## 2020-07-21 DIAGNOSIS — Z0000 Encounter for general adult medical examination without abnormal findings: Secondary | ICD-10-CM

## 2020-07-21 DIAGNOSIS — M722 Plantar fascial fibromatosis: Secondary | ICD-10-CM

## 2020-07-21 MED ORDER — BETAMETHASONE SOD PHOS & ACET 6 (3-3) MG/ML IJ SUSP
3.0000 mg | Freq: Once | INTRAMUSCULAR | Status: AC
  Administered 2020-07-21: 3 mg via INTRA_ARTICULAR

## 2020-07-21 NOTE — Patient Instructions (Signed)
Angela [name] , Thank you for taking time to come for your Medicare Wellness Visit. I appreciate your ongoing commitment to your health goals. Please review the following plan we discussed and let me know if I can assist you in the future.   Screening recommendations/referrals: Colonoscopy: completed 09/07/2019, due 09/06/2024 Mammogram: completed 08/03/2019 Bone Density: n/a Recommended yearly ophthalmology/optometry visit for glaucoma screening and checkup Recommended yearly dental visit for hygiene and checkup  Vaccinations: Influenza vaccine: completed 02/14/2020, due 01/01/2021 Pneumococcal vaccine: n/a Tdap vaccine: completed 08/02/2011, due 08/01/2021 Shingles vaccine: n/a  Covid-19:  05/04/2020, 09/22/2019, 08/30/2019  Advanced directives: Advance directive discussed with you today.   Conditions/risks identified: none  Next appointment: Follow up in one year for your annual wellness visit.   Preventive Care 40-64 Years, Female Preventive care refers to lifestyle choices and visits with your health care provider that can promote health and wellness. What does preventive care include?  A yearly physical exam. This is also called an annual well check.  Dental exams once or twice a year.  Routine eye exams. Ask your health care provider how often you should have your eyes checked.  Personal lifestyle choices, including:  Daily care of your teeth and gums.  Regular physical activity.  Eating a healthy diet.  Avoiding tobacco and drug use.  Limiting alcohol use.  Practicing safe sex.  Taking low-dose aspirin daily starting at age 60.  Taking vitamin and mineral supplements as recommended by your health care provider. What happens during an annual well check? The services and screenings done by your health care provider during your annual well check will depend on your age, overall health, lifestyle risk factors, and family history of disease. Counseling  Your health care provider  may ask you questions about your:  Alcohol use.  Tobacco use.  Drug use.  Emotional well-being.  Home and relationship well-being.  Sexual activity.  Eating habits.  Work and work Statistician.  Method of birth control.  Menstrual cycle.  Pregnancy history. Screening  You may have the following tests or measurements:  Height, weight, and BMI.  Blood pressure.  Lipid and cholesterol levels. These may be checked every 5 years, or more frequently if you are over 58 years old.  Skin check.  Lung cancer screening. You may have this screening every year starting at age 43 if you have a 30-pack-year history of smoking and currently smoke or have quit within the past 15 years.  Fecal occult blood test (FOBT) of the stool. You may have this test every year starting at age 27.  Flexible sigmoidoscopy or colonoscopy. You may have a sigmoidoscopy every 5 years or a colonoscopy every 10 years starting at age 52.  Hepatitis C blood test.  Hepatitis B blood test.  Sexually transmitted disease (STD) testing.  Diabetes screening. This is done by checking your blood sugar (glucose) after you have not eaten for a while (fasting). You may have this done every 1-3 years.  Mammogram. This may be done every 1-2 years. Talk to your health care provider about when you should start having regular mammograms. This may depend on whether you have a family history of breast cancer.  BRCA-related cancer screening. This may be done if you have a family history of breast, ovarian, tubal, or peritoneal cancers.  Pelvic exam and Pap test. This may be done every 3 years starting at age 110. Starting at age 57, this may be done every 5 years if you have a Pap test  in combination with an HPV test.  Bone density scan. This is done to screen for osteoporosis. You may have this scan if you are at high risk for osteoporosis. Discuss your test results, treatment options, and if necessary, the need for more  tests with your health care provider. Vaccines  Your health care provider may recommend certain vaccines, such as:  Influenza vaccine. This is recommended every year.  Tetanus, diphtheria, and acellular pertussis (Tdap, Td) vaccine. You may need a Td booster every 10 years.  Zoster vaccine. You may need this after age 74.  Pneumococcal 13-valent conjugate (PCV13) vaccine. You may need this if you have certain conditions and were not previously vaccinated.  Pneumococcal polysaccharide (PPSV23) vaccine. You may need one or two doses if you smoke cigarettes or if you have certain conditions. Talk to your health care provider about which screenings and vaccines you need and how often you need them. This information is not intended to replace advice given to you by your health care provider. Make sure you discuss any questions you have with your health care provider. Document Released: 06/16/2015 Document Revised: 02/07/2016 Document Reviewed: 03/21/2015 Elsevier Interactive Patient Education  2017 [employer] Prevention in the Home Falls can cause injuries. They can happen to people of all ages. There are many things you can do to make your home safe and to help prevent falls. What can I do on the outside of my home?  Regularly fix the edges of walkways and driveways and fix any cracks.  Remove anything that might make you trip as you walk through a door, such as a raised step or threshold.  Trim any bushes or trees on the path to your home.  Use bright outdoor lighting.  Clear any walking paths of anything that might make someone trip, such as rocks or tools.  Regularly check to see if handrails are loose or broken. Make sure that both sides of any steps have handrails.  Any raised decks and porches should have guardrails on the edges.  Have any leaves, snow, or ice cleared regularly.  Use sand or salt on walking paths during winter.  Clean up any spills in your  garage right away. This includes oil or grease spills. What can I do in the bathroom?  Use night lights.  Install grab bars by the toilet and in the tub and shower. Do not use towel bars as grab bars.  Use non-skid mats or decals in the tub or shower.  If you need to sit down in the shower, use a plastic, non-slip stool.  Keep the floor dry. Clean up any water that spills on the floor as soon as it happens.  Remove soap buildup in the tub or shower regularly.  Attach bath mats securely with double-sided non-slip rug tape.  Do not have throw rugs and other things on the floor that can make you trip. What can I do in the bedroom?  Use night lights.  Make sure that you have a light by your bed that is easy to reach.  Do not use any sheets or blankets that are too big for your bed. They should not hang down onto the floor.  Have a firm chair that has side arms. You can use this for support while you get dressed.  Do not have throw rugs and other things on the floor that can make you trip. What can I do in the kitchen?  Clean up any  spills right away.  Avoid walking on wet floors.  Keep items that you use a lot in easy-to-reach places.  If you need to reach something above you, use a strong step stool that has a grab bar.  Keep electrical cords out of the way.  Do not use floor polish or wax that makes floors slippery. If you must use wax, use non-skid floor wax.  Do not have throw rugs and other things on the floor that can make you trip. What can I do with my stairs?  Do not leave any items on the stairs.  Make sure that there are handrails on both sides of the stairs and use them. Fix handrails that are broken or loose. Make sure that handrails are as long as the stairways.  Check any carpeting to make sure that it is firmly attached to the stairs. Fix any carpet that is loose or worn.  Avoid having throw rugs at the top or bottom of the stairs. If you do have throw  rugs, attach them to the floor with carpet tape.  Make sure that you have a light switch at the top of the stairs and the bottom of the stairs. If you do not have them, ask someone to add them for you. What else can I do to help prevent falls?  Wear shoes that:  Do not have high heels.  Have rubber bottoms.  Are comfortable and fit you well.  Are closed at the toe. Do not wear sandals.  If you use a stepladder:  Make sure that it is fully opened. Do not climb a closed stepladder.  Make sure that both sides of the stepladder are locked into place.  Ask someone to hold it for you, if possible.  Clearly mark and make sure that you can see:  Any grab bars or handrails.  First and last steps.  Where the edge of each step is.  Use tools that help you move around (mobility aids) if they are needed. These include:  Canes.  Walkers.  Scooters.  Crutches.  Turn on the lights when you go into a dark area. Replace any light bulbs as soon as they burn out.  Set up your furniture so you have a clear path. Avoid moving your furniture around.  If any of your floors are uneven, fix them.  If there are any pets around you, be aware of where they are.  Review your medicines with your doctor. Some medicines can make you feel dizzy. This can increase your chance of falling. Ask your doctor what other things that you can do to help prevent falls. This information is not intended to replace advice given to you by your health care provider. Make sure you discuss any questions you have with your health care provider. Document Released: 03/16/2009 Document Revised: 10/26/2015 Document Reviewed: 06/24/2014 Elsevier Interactive Patient Education  2017 [employer].

## 2020-07-21 NOTE — Progress Notes (Signed)
   Subjective: 50 y.o. female presenting today for follow up evaluation of plantar fasciitis of the left foot.  Overall the patient is doing well however she states that the pain is slowly returned.  She wears good supportive shoes.  She presents for further treatment evaluation   Past Medical History:  Diagnosis Date  . 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 Chest pain    a cath 2/10. EF 50-55% normal coronaries  . Complete heart block ([place])   . Depression   . Dysautonomia ([place])   . Dyspnea    CT negative for PE, 2007  . Hypertension   . Insomnia   . OCD (obsessive compulsive disorder)   . SVT (supraventricular tachycardia) ([facility])    s/p ablation a. c/b AV nod ablation requiring pacemaker     Objective: Physical Exam General: The patient is alert and oriented x3 in no acute distress.  Dermatology: Skin is warm, dry and supple bilateral lower extremities. Negative for open lesions or macerations bilateral.   Vascular: Dorsalis Pedis and Posterior Tibial pulses palpable bilateral.  Capillary fill time is immediate to all digits.  Neurological: Epicritic and protective threshold intact bilateral.   Musculoskeletal: Improved tenderness to palpation to the plantar aspect of the left heel along the plantar fascia. All other joints range of motion within normal limits bilateral. Strength 5/5 in all groups bilateral.  There is a palpable nodule noted well adhered along the plantar fascia of the left foot consistent with findings of plantar fibroma  Assessment: 1. Plantar fasciitis left foot  2.  Plantar fibroma left  Plan of Care:  1. Patient evaluated.   Injection of 0.5 cc Celestone Soluspan injected into the plantar fascia and fibroma area left 2.  Continue meloxicam daily as needed 3.  Continue daily stretching exercises 4.  Continue wearing [place] and Brooks shoes 5.  Recommend bionic sandals for the summer. 6.  Return to clinic as needed.    Works part time  book keeping. 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 Going to [place] in December.    Edrick Kins, DPM [facility]  Dr. Edrick Kins, DPM    [address], [place] [address]                Office [phone]  Fax [phone]

## 2020-07-21 NOTE — Progress Notes (Signed)
I connected with Caryle Helgeson today by telephone and verified that I am speaking with the correct person using two identifiers. Location patient: home Location provider: work Persons participating in the virtual visit: [place], Sparrow LPN.   I discussed the limitations, risks, security and privacy concerns of performing an evaluation and management service by telephone and the availability of in person appointments. I also discussed with the patient that there may be a patient responsible charge related to this service. The patient expressed understanding and verbally consented to this telephonic visit.    Interactive audio and video telecommunications were attempted between this provider and patient, however failed, due to patient having technical difficulties OR patient did not have access to video capability.  We continued and completed visit with audio only.     Vital signs may be patient reported or missing.  Subjective:   FREIDA NEBEL is a 50 y.o. female who presents for Medicare Annual (Subsequent) preventive examination.  Review of Systems     Cardiac Risk Factors include: family history of premature cardiovascular disease     Objective:    Today's Vitals   07/21/20 1432 07/21/20 1433  Weight: 162 lb (73.5 kg)   Height: 5' 3.5" (1.613 m)   PainSc:  6    Body mass index is 28.25 kg/m.  Advanced Directives 07/21/2020 09/07/2019 07/08/2019 01/23/2018 01/17/2017 12/11/2015 07/13/2014  Does Patient Have a Medical Advance Directive? No No Yes Yes No No No  Type of Advance Directive - - Living will;Healthcare Power of [place];Living will - - -  Copy of [place] in Chart? - - No - copy requested No - copy requested - - -  Would patient like information on creating a medical advance directive? - No - Patient declined - - No - Patient declined No - patient declined information No - patient declined information     Current Medications (verified) Outpatient Encounter Medications as of 07/21/2020  Medication Sig  . buPROPion (WELLBUTRIN SR) 200 MG 12 hr tablet Take 1 tablet (200 mg total) by mouth 2 (two) times daily.  . Cyanocobalamin (VITAMIN B 12) 500 MCG TABS Take 1,000 mcg by mouth daily.  . cyclobenzaprine (FLEXERIL) 5 MG tablet Take by mouth.  . linaclotide (LINZESS) 72 MCG capsule Take 1 capsule (72 mcg total) by mouth daily.  Marland Kitchen LINZESS 145 MCG CAPS capsule TAKE 1 CAPSULE(145 MCG) BY MOUTH DAILY BEFORE BREAKFAST  . LORazepam (ATIVAN) 0.5 MG tablet TAKE 1 TABLET(0.5 MG) BY MOUTH DAILY AS NEEDED FOR ANXIETY  . meclizine (ANTIVERT) 25 MG tablet Take 1 tablet (25 mg total) by mouth 3 (three) times daily as needed for dizziness.  . meloxicam (MOBIC) 15 MG tablet Take 1 tablet (15 mg total) by mouth daily.  . metoprolol tartrate (LOPRESSOR) 50 MG tablet Take 1 tablet (50 mg total) by mouth 2 (two) times daily.  . Multiple Vitamins-Minerals (MULTIVITAMINS THER. W/MINERALS) TABS Take 1 tablet by mouth daily.  . ondansetron (ZOFRAN ODT) 4 MG disintegrating tablet Take 1 tablet (4 mg total) by mouth every 8 (eight) hours as needed for nausea or vomiting.  . traMADol (ULTRAM) 50 MG tablet   . venlafaxine XR (EFFEXOR-XR) 150 MG 24 hr capsule Take 1 capsule (150 mg total) by mouth daily with breakfast.   No facility-administered encounter medications on file as of 07/21/2020.    Allergies (verified) Toprol xl [metoprolol succinate]   History: Past Medical History:  Diagnosis Date  .  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 Chest pain    a cath 2/10. EF 50-55% normal coronaries  . Complete heart block ([place])   . Depression   . Dysautonomia ([place])   . Dyspnea    CT negative for PE, 2007  . Hypertension   . Insomnia   . OCD (obsessive compulsive disorder)   . SVT (supraventricular tachycardia) ([facility])    s/p ablation a. c/b AV nod ablation requiring pacemaker   Past Surgical History:   Procedure Laterality Date  . BREAST BIOPSY Right 1998  . BREAST BIOPSY Right 2004  . BREAST LUMPECTOMY     RIGHT -BREAST   . COLONOSCOPY WITH PROPOFOL N/A 09/07/2019   Procedure: COLONOSCOPY WITH PROPOFOL;  Surgeon: Lucilla Lame, MD;  Location[facility]COPY;  Service: Endoscopy;  Laterality: N/A;  . ENDOMETRIAL ABLATION  2005  . INSERT / REPLACE / REMOVE PACEMAKER    . PELVIC LAPAROSCOPY  1990  . PERMANENT PACEMAKER GENERATOR CHANGE N/A 07/13/2014   MDT MRI compatible dual chamber pacemaker implanted by Dr Caryl Comes  . SPARK  2005  . TUBAL LIGATION  2003   Family History  Problem Relation Age of Onset  . Hypertension Father   . Cancer Father 46       COLON  . Diabetes Father   . Breast cancer Paternal Aunt   . Cancer Paternal Aunt   . Hypertension Maternal Grandfather   . Diabetes Paternal Grandmother   . Breast cancer Paternal Grandmother   . Arthritis Paternal Grandmother   . Cancer Paternal Grandmother   . Diabetes Paternal Grandfather   . Cancer Paternal Grandfather   . Breast cancer Maternal Aunt        mastectomy  . Cancer Maternal Aunt   . Anxiety disorder Mother   . Arthritis Mother   . Obesity Mother   . Dementia Maternal Grandmother   . Breast cancer Paternal Aunt   . Anxiety disorder Sister    Social History   Socioeconomic History  . Marital status: Married    Spouse name: Not on file  . Number of children: Not on file  . Years of education: Not on file  . Highest education level: Bachelor's degree (e.g., BA, AB, BS)  Occupational History  . Occupation: disability   Tobacco Use  . Smoking status: Never Smoker  . Smokeless tobacco: Never Used  Vaping Use  . Vaping Use: Never used  Substance and Sexual Activity  . Alcohol use: No  . Drug use: No  . Sexual activity: Yes    Birth control/protection: Surgical    Comment: Uterine ablarion 2002  Other Topics Concern  . Not on file  Social History Narrative   Work with daughters school    Social  Determinants of Health   Financial Resource Strain: Low Risk   . Difficulty of Paying Living Expenses: Not hard at all  Food Insecurity: No Food Insecurity  . Worried About Charity fundraiser in the Last Year: Never true  . Ran Out of Food in the Last Year: Never true  Transportation Needs: No Transportation Needs  . Lack of Transportation (Medical): No  . Lack of Transportation (Non-Medical): No  Physical Activity: Sufficiently Active  . Days of Exercise per Week: 5 days  . Minutes of Exercise per Session: 30 min  Stress: No Stress Concern Present  . Feeling of Stress : Not at all  Social Connections: Not on file    Tobacco  Counseling Counseling given: Not Answered   Clinical Intake:  Pre-visit preparation completed: Yes  Pain : 0-10 Pain Score: 6  Pain Type: Chronic pain Pain Location:  (neck, arm and foot) Pain Descriptors / Indicators: Aching,Sharp,Constant Pain Frequency: Constant     Nutritional Status: BMI 25 -29 Overweight Nutritional Risks: None Diabetes: No  How often do you need to have someone help you when you read instructions, pamphlets, or other written materials from your doctor or pharmacy?: 1 - Never What is the last grade level you completed in school?: college  Diabetic? no  Interpreter Needed?: No  Information entered by :: NAllen LPN   Activities of Daily Living In your present state of health, do you have any difficulty performing the following activities: 07/21/2020 07/10/2020  Hearing? N N  Vision? N N  Difficulty concentrating or making decisions? Y Y  Comment some brain fog -  Walking or climbing stairs? Y Y  Comment gets winded Dizziness and joint pain  Dressing or bathing? N N  Comment sometimes husband has to help due to neck -  Doing errands, shopping? N N  Preparing Food and eating ? N -  Using the Toilet? N -  In the past six months, have you accidently leaked urine? Y -  Comment with sneezes -  Do you have problems with  loss of bowel control? N -  Managing your Medications? N -  Managing your Finances? N -  Housekeeping or managing your Housekeeping? Y -  Comment husband assists -  Some recent data might be hidden    Patient Care Team: Valerie Roys, DO as PCP - General (Family Medicine)  Indicate any recent Medical Services you may have received from other than [facility] providers in the past year (date may be approximate).     Assessment:   This is a routine wellness examination for Alana.  Hearing/Vision screen  Hearing Screening   125Hz  250Hz  500Hz  1000Hz  2000Hz  3000Hz  4000Hz  6000Hz  8000Hz   Right ear:           Left ear:           Vision Screening Comments: Regular eye exams, Dr. Ellin Mayhew  Dietary issues and exercise activities discussed: Current Exercise Habits: Home exercise routine, Type of exercise: Other - see comments (stationary bike), Time (Minutes): 30, Frequency (Times/Week): 5, Weekly Exercise (Minutes/Week): 150  Goals    . Patient Stated     07/21/2020, wants to lose 5-10 pounds      Depression Screen PHQ 2/9 Scores 07/21/2020 06/27/2020 03/24/2020 01/17/2020 07/09/2019 07/08/2019 03/18/2018  PHQ - 2 Score 0 1 0 1 2 0 0  PHQ- 9 Score - 10 0 7 8 - 3    Fall Risk Fall Risk  07/21/2020 03/24/2020 07/08/2019 03/18/2018 01/23/2018  Falls in the past year? 1 0 1 Yes Yes  Comment got dizzy, not sure what happened - - - -  Number falls in past yr: 1 0 0 2 or more 1  Comment - - November, passed out in the gym, fell off eliptical. - -  Injury with Fall? 0 0 0 No No  Comment - - just some bruising - -  Risk for fall due to : Medication side effect;Other (Comment) No Fall Risks - - -  Risk for fall due to: Comment gets dizzy regularly - - - -  Follow up Falls evaluation completed;Education provided;Falls prevention discussed Falls evaluation completed - - -    FALL RISK PREVENTION PERTAINING TO THE  HOME:  Any stairs in or around the home? Yes  If so, are there any without handrails?  No  Home free of loose throw rugs in walkways, pet beds, electrical cords, etc? Yes  Adequate lighting in your home to reduce risk of falls? Yes   ASSISTIVE DEVICES UTILIZED TO PREVENT FALLS:  Life alert? No  Use of a cane, walker or w/c? No  Grab bars in the bathroom? No  Shower chair or bench in shower? Yes  Elevated toilet seat or a handicapped toilet? Yes   TIMED UP AND GO:  Was the test performed? No .      Cognitive Function:     6CIT Screen 07/21/2020 07/09/2019 01/23/2018 01/17/2017  What Year? 0 points 0 points 0 points 0 points  What month? 0 points 0 points 0 points 0 points  What time? 0 points 0 points 0 points 0 points  Count back from 20 0 points 0 points 0 points 0 points  Months in reverse 0 points 0 points 0 points 0 points  Repeat phrase 4 points 2 points 0 points 2 points  Total Score 4 2 0 2    Immunizations Immunization History  Administered Date(s) Administered  . Influenza,inj,Quad PF,6+ Mos 02/27/2017, 03/18/2018, 03/19/2019, 02/14/2020  . PFIZER(Purple Top)SARS-COV-2 Vaccination 08/30/2019, 09/22/2019, 05/04/2020  . Tdap 08/02/2011    TDAP status: Up to date  Flu Vaccine status: Up to date  Pneumococcal vaccine status: Up to date  Covid-19 vaccine status: Completed vaccines  Qualifies for Shingles Vaccine? No   Zostavax completed n/a  Shingrix Completed?: n/a  Screening Tests Health Maintenance  Topic Date Due  . MAMMOGRAM  08/02/2020  . TETANUS/TDAP  08/01/2021  . COLONOSCOPY (Pts 45-82yrs Insurance coverage will need to be confirmed)  09/06/2024  . PAP SMEAR-Modifier  07/10/2025  . INFLUENZA VACCINE  Completed  . COVID-19 Vaccine  Completed  . Hepatitis C Screening  Completed  . HIV Screening  Completed    Health Maintenance  There are no preventive care reminders to display for this patient.  Colorectal cancer screening: Type of screening: Colonoscopy. Completed 09/07/2019. Repeat every 5 years  Mammogram status: Completed  08/03/2019. Repeat every year  Bone Density status: n/a  Lung Cancer Screening: (Low Dose CT Chest recommended if Age 80-80 years, 30 pack-year currently smoking OR have quit w/in 15years.) does not qualify.   Lung Cancer Screening Referral: no  Additional Screening:  Hepatitis C Screening: does qualify; Completed 07/10/2020  Vision Screening: Recommended annual ophthalmology exams for early detection of glaucoma and other disorders of the eye. Is the patient up to date with their annual eye exam?  Yes  Who is the provider or what is the name of the office in which the patient attends annual eye exams? Dr. Ellin Mayhew If pt is not established with a provider, would they like to be referred to a provider to establish care? No .   Dental Screening: Recommended annual dental exams for proper oral hygiene  Community Resource Referral / Chronic Care Management: CRR required this visit?  No   CCM required this visit?  No      Plan:     I have personally reviewed and noted the following in the patient's chart:   . Medical and social history . Use of alcohol, tobacco or illicit drugs  . Current medications and supplements . Functional ability and status . Nutritional status . Physical activity . Advanced directives . List of other physicians . Hospitalizations, surgeries, and  ER visits in previous 12 months . Vitals . Screenings to include cognitive, depression, and falls . Referrals and appointments  In addition, I have reviewed and discussed with patient certain preventive protocols, quality metrics, and best practice recommendations. A written personalized care plan for preventive services as well as general preventive health recommendations were provided to patient.     Kellie Simmering, LPN   [id number]/9983   Nurse Notes:

## 2020-07-31 DIAGNOSIS — M5412 Radiculopathy, cervical region: Secondary | ICD-10-CM | POA: Diagnosis not present

## 2020-07-31 DIAGNOSIS — R030 Elevated blood-pressure reading, without diagnosis of hypertension: Secondary | ICD-10-CM | POA: Diagnosis not present

## 2020-07-31 DIAGNOSIS — M47812 Spondylosis without myelopathy or radiculopathy, cervical region: Secondary | ICD-10-CM | POA: Diagnosis not present

## 2020-07-31 DIAGNOSIS — Z6828 Body mass index (BMI) 28.0-28.9, adult: Secondary | ICD-10-CM | POA: Diagnosis not present

## 2020-08-08 ENCOUNTER — Encounter: Payer: Self-pay | Admitting: Family Medicine

## 2020-08-08 ENCOUNTER — Telehealth: Payer: Medicare PPO | Admitting: Family Medicine

## 2020-08-08 ENCOUNTER — Other Ambulatory Visit: Payer: Self-pay

## 2020-08-08 DIAGNOSIS — F339 Major depressive disorder, recurrent, unspecified: Secondary | ICD-10-CM | POA: Diagnosis not present

## 2020-08-08 MED ORDER — VENLAFAXINE HCL ER 75 MG PO CP24: ORAL_CAPSULE | ORAL | 2 refills | Status: DC

## 2020-08-08 MED ORDER — BUPROPION HCL ER (SR) 150 MG PO TB12: 150.0000 mg | ORAL_TABLET | Freq: Two times a day (BID) | ORAL | 1 refills | Status: DC

## 2020-08-08 NOTE — Assessment & Plan Note (Signed)
Not doing well. Feels like the wellbutrin has been making her worse. Will go back to 150mg  on the wellbutrin and increase her effexor to 225mg  daily. Recheck 3-4 weeks. Call with any concerns.

## 2020-08-08 NOTE — Progress Notes (Signed)
There were no vitals taken for this visit.   Subjective:    Patient ID: Angela [name], female    DOB: 1970/08/04, 50 y.o.   MRN: [id number]  HPI: Angela [name] is a 50 y.o. female  Chief Complaint  Patient presents with  . Anxiety  . Depression   DEPRESSION Mood status: uncontrolled Satisfied with current treatment?: no Symptom severity: moderate  Duration of current treatment : chronic Side effects: no Medication compliance: excellent compliance Psychotherapy/counseling: no  Previous psychiatric medications: effexor, wellbutrin Depressed mood: yes Anxious mood: yes Anhedonia: no Significant weight loss or gain: no Insomnia: no  Fatigue: yes Feelings of worthlessness or guilt: yes Impaired concentration/indecisiveness: no Suicidal ideations: no Hopelessness: no Crying spells: yes Depression screen [facility] 2/9 08/08/2020 07/21/2020 06/27/2020 03/24/2020 01/17/2020  Decreased Interest 1 0 0 0 1  Down, Depressed, Hopeless 2 0 1 0 0  PHQ - 2 Score 3 0 1 0 1  Altered sleeping 2 - 3 0 2  Tired, decreased energy 2 - 3 0 1  Change in appetite 2 - 0 0 1  Feeling bad or failure about yourself  0 - 0 0 0  Trouble concentrating 2 - 3 0 2  Moving slowly or fidgety/restless 0 - 0 0 0  Suicidal thoughts 0 - 0 0 0  PHQ-9 Score 11 - 10 0 7  Difficult doing work/chores Somewhat difficult - Not difficult at all Not difficult at all Somewhat difficult  Some recent data might be hidden   Relevant past medical, surgical, family and social history reviewed and updated as indicated. Interim medical history since our last visit reviewed. Allergies and medications reviewed and updated.  Review of Systems  Constitutional: Negative.   Respiratory: Negative.   Cardiovascular: Negative.   Gastrointestinal: Negative.   Musculoskeletal: Negative.   Neurological: Negative.   Psychiatric/Behavioral: Positive for dysphoric mood. Negative for agitation, behavioral problems, confusion,  decreased concentration, hallucinations, self-injury, sleep disturbance and suicidal ideas. The patient is nervous/anxious. The patient is not hyperactive.     Per HPI unless specifically indicated above     Objective:    There were no vitals taken for this visit.  Wt Readings from Last 3 Encounters:  07/21/20 162 lb (73.5 kg)  07/10/20 168 lb 2 oz (76.3 kg)  06/30/20 168 lb 6.4 oz (76.4 kg)    Physical Exam Vitals and nursing note reviewed.  Constitutional:      General: She is not in acute distress.    Appearance: Normal appearance. She is not ill-appearing, toxic-appearing or diaphoretic.  HENT:     Head: Normocephalic and atraumatic.     Right Ear: External ear normal.     Left Ear: External ear normal.     Nose: Nose normal.     Mouth/Throat:     Mouth: Mucous membranes are moist.     Pharynx: Oropharynx is clear.  Eyes:     General: No scleral icterus.       Right eye: No discharge.        Left eye: No discharge.     Conjunctiva/sclera: Conjunctivae normal.     Pupils: Pupils are equal, round, and reactive to light.  Pulmonary:     Effort: Pulmonary effort is normal. No respiratory distress.     Comments: Speaking in full sentences Musculoskeletal:        General: Normal range of motion.     Cervical back: Normal range of motion.  Skin:    Coloration:  Skin is not jaundiced or pale.     Findings: No bruising, erythema, lesion or rash.  Neurological:     Mental Status: She is alert and oriented to person, place, and time. Mental status is at baseline.  Psychiatric:        Mood and Affect: Mood normal.        Behavior: Behavior normal.        Thought Content: Thought content normal.        Judgment: Judgment normal.     Results for orders placed or performed in visit on 07/10/20  Hepatitis C antibody  Result Value Ref Range   Hep C Virus Ab 0.1 0.0 - 0.9 s/co ratio  CBC with Differential/Platelet  Result Value Ref Range   WBC 7.2 3.4 - 10.8 x10E3/uL   RBC  4.40 3.77 - 5.28 x10E6/uL   Hemoglobin 13.8 11.1 - 15.9 g/dL   Hematocrit 41.5 34.0 - 46.6 %   MCV 94 79 - 97 fL   MCH 31.4 26.6 - 33.0 pg   MCHC 33.3 31.5 - 35.7 g/dL   RDW 11.7 11.7 - 15.4 %   Platelets 293 150 - 450 x10E3/uL   Neutrophils 65 Not Estab. %   Lymphs 26 Not Estab. %   Monocytes 8 Not Estab. %   Eos 1 Not Estab. %   Basos 0 Not Estab. %   Neutrophils Absolute 4.7 1.4 - 7.0 x10E3/uL   Lymphocytes Absolute 1.8 0.7 - 3.1 x10E3/uL   Monocytes Absolute 0.5 0.1 - 0.9 x10E3/uL   EOS (ABSOLUTE) 0.0 0.0 - 0.4 x10E3/uL   Basophils Absolute 0.0 0.0 - 0.2 x10E3/uL   Immature Granulocytes 0 Not Estab. %   Immature Grans (Abs) 0.0 0.0 - 0.1 x10E3/uL  Comprehensive metabolic panel  Result Value Ref Range   Glucose 80 65 - 99 mg/dL   BUN 15 6 - 24 mg/dL   Creatinine, Ser 0.81 0.57 - 1.00 mg/dL   GFR calc non Af Amer 86 >59 mL/min/1.73   GFR calc Af Amer 99 >59 mL/min/1.73   BUN/Creatinine Ratio 19 9 - 23   Sodium 137 134 - 144 mmol/L   Potassium 4.2 3.5 - 5.2 mmol/L   Chloride 100 96 - 106 mmol/L   CO2 23 20 - 29 mmol/L   Calcium 9.4 8.7 - 10.2 mg/dL   Total Protein 6.2 6.0 - 8.5 g/dL   Albumin 4.3 3.8 - 4.8 g/dL   Globulin, Total 1.9 1.5 - 4.5 g/dL   Albumin/Globulin Ratio 2.3 (H) 1.2 - 2.2   Bilirubin Total 0.3 0.0 - 1.2 mg/dL   Alkaline Phosphatase 86 44 - 121 IU/L   AST 22 0 - 40 IU/L   ALT 19 0 - 32 IU/L  Lipid Panel w/o Chol/HDL Ratio  Result Value Ref Range   Cholesterol, Total 195 100 - 199 mg/dL   Triglycerides 94 0 - 149 mg/dL   HDL 76 >39 mg/dL   VLDL Cholesterol Cal 17 5 - 40 mg/dL   LDL Chol Calc (NIH) 102 (H) 0 - 99 mg/dL  Urinalysis, Routine w reflex microscopic  Result Value Ref Range   Specific Gravity, UA >1.030 (H) 1.005 - 1.030   pH, UA 5.5 5.0 - 7.5   Color, UA Yellow Yellow   Appearance Ur Clear Clear   Leukocytes,UA Negative Negative   Protein,UA Negative Negative/Trace   Glucose, UA Negative Negative   Ketones, UA Negative Negative    RBC, UA Negative Negative   Bilirubin,  UA Negative Negative   Urobilinogen, Ur 0.2 0.2 - 1.0 mg/dL   Nitrite, UA Negative Negative  TSH  Result Value Ref Range   TSH 1.150 0.450 - 4.500 uIU/mL  Cytology - PAP  Result Value Ref Range   High risk HPV Negative    Adequacy      Satisfactory for evaluation; transformation zone component ABSENT.   Diagnosis      - Negative for intraepithelial lesion or malignancy (NILM)   Comment Normal Reference Range HPV - Negative       Assessment & Plan:   Problem List Items Addressed This Visit      Other   Depression, recurrent ([place])    Not doing well. Feels like the wellbutrin has been making her worse. Will go back to 150mg  on the wellbutrin and increase her effexor to 225mg  daily. Recheck 3-4 weeks. Call with any concerns.       Relevant Medications   buPROPion (WELLBUTRIN SR) 150 MG 12 hr tablet   venlafaxine XR (EFFEXOR XR) 75 MG 24 hr capsule       Follow up plan: Return in about 4 weeks (around 09/05/2020).    . This visit was completed via MyChart due to the restrictions of the COVID-19 pandemic. All issues as above were discussed and addressed. Physical exam was done as above through visual confirmation on MyChart. If it was felt that the patient should be evaluated in the office, they were directed there. The patient verbally consented to this visit. . Location of the patient: work . Location of the provider: work . Those involved with this call:  . Provider: Park Liter, DO . CMA: Tiffany Reel, CMA . Front Desk/Registration: Jill Side  . Time spent on call: 15 minutes with patient face to face via video conference. More than 50% of this time was spent in counseling and coordination of care. 23 minutes total spent in review of patient's record and preparation of their chart.

## 2020-08-09 ENCOUNTER — Ambulatory Visit (INDEPENDENT_AMBULATORY_CARE_PROVIDER_SITE_OTHER): Payer: Medicare PPO

## 2020-08-09 DIAGNOSIS — I442 Atrioventricular block, complete: Secondary | ICD-10-CM

## 2020-08-11 LAB — CUP PACEART REMOTE DEVICE CHECK
Battery Remaining Longevity: 34 mo
Battery Voltage: 2.96 V
Brady Statistic AP VP Percent: 15 %
Brady Statistic AP VS Percent: 0.02 %
Brady Statistic AS VP Percent: 84.96 %
Brady Statistic AS VS Percent: 0.02 %
Brady Statistic RA Percent Paced: 15 %
Brady Statistic RV Percent Paced: 99.95 %
Date Time Interrogation Session: 20220309092338
Implantable Lead Implant Date: 20070319
Implantable Lead Implant Date: 20070319
Implantable Lead Location: 753859
Implantable Lead Location: 753860
Implantable Lead Model: 5076
Implantable Lead Model: 5076
Implantable Pulse Generator Implant Date: 20160210
Lead Channel Impedance Value: 285 Ohm
Lead Channel Impedance Value: 399 Ohm
Lead Channel Impedance Value: 418 Ohm
Lead Channel Impedance Value: 475 Ohm
Lead Channel Pacing Threshold Amplitude: 0.75 V
Lead Channel Pacing Threshold Amplitude: 0.75 V
Lead Channel Pacing Threshold Pulse Width: 0.4 ms
Lead Channel Pacing Threshold Pulse Width: 0.4 ms
Lead Channel Sensing Intrinsic Amplitude: 3.625 mV
Lead Channel Sensing Intrinsic Amplitude: 3.625 mV
Lead Channel Sensing Intrinsic Amplitude: 4.375 mV
Lead Channel Sensing Intrinsic Amplitude: 4.375 mV
Lead Channel Setting Pacing Amplitude: 2 V
Lead Channel Setting Pacing Amplitude: 2.5 V
Lead Channel Setting Pacing Pulse Width: 0.4 ms
Lead Channel Setting Sensing Sensitivity: 2 mV

## 2020-08-17 ENCOUNTER — Telehealth: Payer: Self-pay

## 2020-08-17 NOTE — Progress Notes (Signed)
Remote pacemaker transmission.   

## 2020-08-17 NOTE — Telephone Encounter (Signed)
Fax medical records to Disability determination services per there request. Faxed from 05/11/2019 to present

## 2020-09-06 ENCOUNTER — Other Ambulatory Visit: Payer: Self-pay

## 2020-09-06 ENCOUNTER — Encounter: Payer: Self-pay | Admitting: Family Medicine

## 2020-09-06 ENCOUNTER — Ambulatory Visit: Payer: Medicare PPO | Admitting: Family Medicine

## 2020-09-06 DIAGNOSIS — F339 Major depressive disorder, recurrent, unspecified: Secondary | ICD-10-CM

## 2020-09-06 MED ORDER — VENLAFAXINE HCL ER 75 MG PO CP24: ORAL_CAPSULE | ORAL | 1 refills | Status: DC

## 2020-09-06 NOTE — Assessment & Plan Note (Signed)
Doing well, still has a lot of stress. Will continue current regimen and we'll check in at the end of May after some of her social stressors have eased. Call with any concerns. Refills given today.

## 2020-09-06 NOTE — Progress Notes (Signed)
BP 115/79   Pulse 73   Temp 98 F (36.7 C)   Wt 167 lb 12.8 oz (76.1 kg)   SpO2 99%   BMI 29.26 kg/m    Subjective:    Patient ID: Angela [name], female    DOB: [birth year], 50 y.o.   MRN: [id number]  HPI: Angela [name] is a 50 y.o. female  Chief Complaint  Patient presents with  . Depression   ANXIETY/DEPRESSION Duration: chronic Status:better Anxious mood: yes  Excessive worrying: yes Irritability: no  Sweating: no Nausea: no Palpitations:yes Hyperventilation: no Panic attacks: no Agoraphobia: no  Obscessions/compulsions: no Depressed mood: no Depression screen [facility] 2/9 09/06/2020 08/08/2020 07/21/2020 06/27/2020 03/24/2020  Decreased Interest 1 1 0 0 0  Down, Depressed, Hopeless 1 2 0 1 0  PHQ - 2 Score 2 3 0 1 0  Altered sleeping 2 2 - 3 0  Tired, decreased energy 1 2 - 3 0  Change in appetite 0 2 - 0 0  Feeling bad or failure about yourself  0 0 - 0 0  Trouble concentrating 2 2 - 3 0  Moving slowly or fidgety/restless 0 0 - 0 0  Suicidal thoughts 0 0 - 0 0  PHQ-9 Score 7 11 - 10 0  Difficult doing work/chores Somewhat difficult Somewhat difficult - Not difficult at all Not difficult at all  Some recent data might be hidden   Anhedonia: no Weight changes: no Insomnia: no   Hypersomnia: no Fatigue/loss of energy: yes Feelings of worthlessness: no Feelings of guilt: no Impaired concentration/indecisiveness: no Suicidal ideations: no  Crying spells: no Recent Stressors/Life Changes: yes   Relationship problems: no   Family stress: yes     Financial stress: yes    Job stress: yes    Recent death/loss: no  Relevant past medical, surgical, family and social history reviewed and updated as indicated. Interim medical history since our last visit reviewed. Allergies and medications reviewed and updated.  Review of Systems  Constitutional: Negative.   Respiratory: Negative.   Cardiovascular: Negative.   Gastrointestinal: Negative.    Psychiatric/Behavioral: Negative.     Per HPI unless specifically indicated above     Objective:    BP 115/79   Pulse 73   Temp 98 F (36.7 C)   Wt 167 lb 12.8 oz (76.1 kg)   SpO2 99%   BMI 29.26 kg/m   Wt Readings from Last 3 Encounters:  09/06/20 167 lb 12.8 oz (76.1 kg)  07/21/20 162 lb (73.5 kg)  07/10/20 168 lb 2 oz (76.3 kg)    Physical Exam Vitals and nursing note reviewed.  Constitutional:      General: She is not in acute distress.    Appearance: Normal appearance. She is not ill-appearing, toxic-appearing or diaphoretic.  HENT:     Head: Normocephalic and atraumatic.     Right Ear: External ear normal.     Left Ear: External ear normal.     Nose: Nose normal.     Mouth/Throat:     Mouth: Mucous membranes are moist.     Pharynx: Oropharynx is clear.  Eyes:     General: No scleral icterus.       Right eye: No discharge.        Left eye: No discharge.     Extraocular Movements: Extraocular movements intact.     Conjunctiva/sclera: Conjunctivae normal.     Pupils: Pupils are equal, round, and reactive to light.  Cardiovascular:  Rate and Rhythm: Normal rate and regular rhythm.     Pulses: Normal pulses.     Heart sounds: Normal heart sounds. No murmur heard. No friction rub. No gallop.   Pulmonary:     Effort: Pulmonary effort is normal. No respiratory distress.     Breath sounds: Normal breath sounds. No stridor. No wheezing, rhonchi or rales.  Chest:     Chest wall: No tenderness.  Musculoskeletal:        General: Normal range of motion.     Cervical back: Normal range of motion and neck supple.  Skin:    General: Skin is warm and dry.     Capillary Refill: Capillary refill takes less than 2 seconds.     Coloration: Skin is not jaundiced or pale.     Findings: No bruising, erythema, lesion or rash.  Neurological:     General: No focal deficit present.     Mental Status: She is alert and oriented to person, place, and time. Mental status is at  baseline.  Psychiatric:        Mood and Affect: Mood normal.        Behavior: Behavior normal.        Thought Content: Thought content normal.        Judgment: Judgment normal.     Results for orders placed or performed in visit on 08/09/20  CUP PACEART REMOTE DEVICE CHECK  Result Value Ref Range   Date Time Interrogation Session [id number]    Pulse Generator Manufacturer MERM    Pulse Gen Model A2DR01 Advisa DR MRI    Pulse Gen Serial Number [id number]    Clinic Name [place] Pulse Generator Type Implantable Pulse Generator    Implantable Pulse Generator Implant Date 16010932    Implantable Lead Manufacturer MERM    Implantable Lead Model 5076 CapSureFix Novus    Implantable Lead Serial Number [id number]    Implantable Lead Implant Date 42706237    Implantable Lead Location Detail 1 APPENDAGE    Implantable Lead Location [id number]    Implantable Lead Manufacturer MERM    Implantable Lead Model 5076 CapSureFix Novus    Implantable Lead Serial Number [id number]    Implantable Lead Implant Date 62831517    Implantable Lead Location Detail 1 APEX    Implantable Lead Location [id number]    Lead Channel Setting Sensing Sensitivity 2 mV   Lead Channel Setting Pacing Amplitude 2 V   Lead Channel Setting Pacing Pulse Width 0.4 ms   Lead Channel Setting Pacing Amplitude 2.5 V   Lead Channel Impedance Value 418 ohm   Lead Channel Impedance Value 285 ohm   Lead Channel Sensing Intrinsic Amplitude 3.625 mV   Lead Channel Sensing Intrinsic Amplitude 3.625 mV   Lead Channel Pacing Threshold Amplitude 0.75 V   Lead Channel Pacing Threshold Pulse Width 0.4 ms   Lead Channel Impedance Value 475 ohm   Lead Channel Impedance Value 399 ohm   Lead Channel Sensing Intrinsic Amplitude 4.375 mV   Lead Channel Sensing Intrinsic Amplitude 4.375 mV   Lead Channel Pacing Threshold Amplitude 0.75 V   Lead Channel Pacing Threshold Pulse Width 0.4 ms   Battery Status OK    Battery  Remaining Longevity 34 mo   Battery Voltage 2.96 V   Brady Statistic RA Percent Paced 15 %   Brady Statistic RV Percent Paced 99.95 %   Brady Statistic AP VP Percent 15 %   [employer] AS  VP Percent 84.96 %   Brady Statistic AP VS Percent 0.02 %   Brady Statistic AS VS Percent 0.02 %      Assessment & Plan:   Problem List Items Addressed This Visit      Other   Depression, recurrent ([place])    Doing well, still has a lot of stress. Will continue current regimen and we'll check in at the end of May after some of her social stressors have eased. Call with any concerns. Refills given today.       Relevant Medications   venlafaxine XR (EFFEXOR XR) 75 MG 24 hr capsule       Follow up plan: Return August 6 month follow up.

## 2020-09-13 ENCOUNTER — Ambulatory Visit
Admission: RE | Admit: 2020-09-13 | Discharge: 2020-09-13 | Disposition: A | Discharge status: Home / Self Care | Payer: Medicare PPO | Source: Ambulatory Visit | Attending: Family Medicine | Admitting: Family Medicine

## 2020-09-13 ENCOUNTER — Other Ambulatory Visit: Payer: Self-pay

## 2020-09-13 DIAGNOSIS — Z1231 Encounter for screening mammogram for malignant neoplasm of breast: Secondary | ICD-10-CM

## 2020-09-13 IMAGING — MG MM DIGITAL SCREENING BILAT W/ TOMO AND CAD
6 of 10 series · 6 of 30 positions shown · non-contrast
Comparison: Previous exam(s).

CLINICAL DATA: Screening.

EXAM:
DIGITAL SCREENING BILATERAL MAMMOGRAM WITH TOMOSYNTHESIS AND CAD
TECHNIQUE: Bilateral screening digital craniocaudal and mediolateral oblique
mammograms were obtained. Bilateral screening digital breast
tomosynthesis was performed. The images were evaluated with
computer-aided detection.

[R MLO synth-2D]
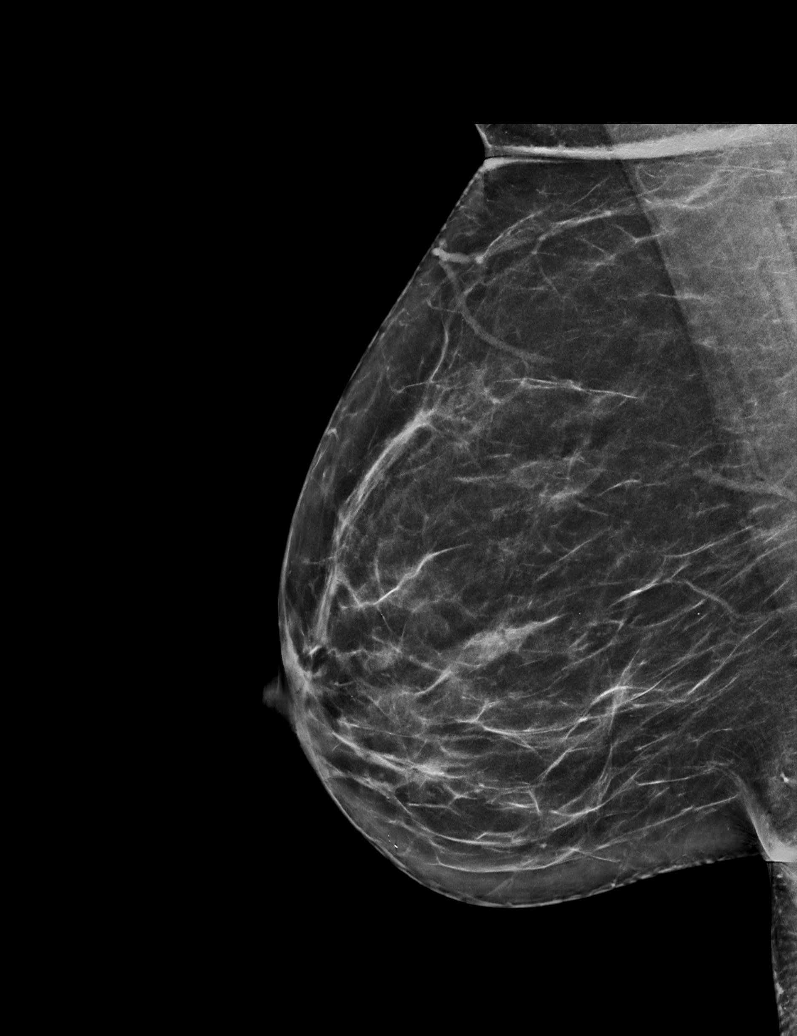

[L CC synth-2D]
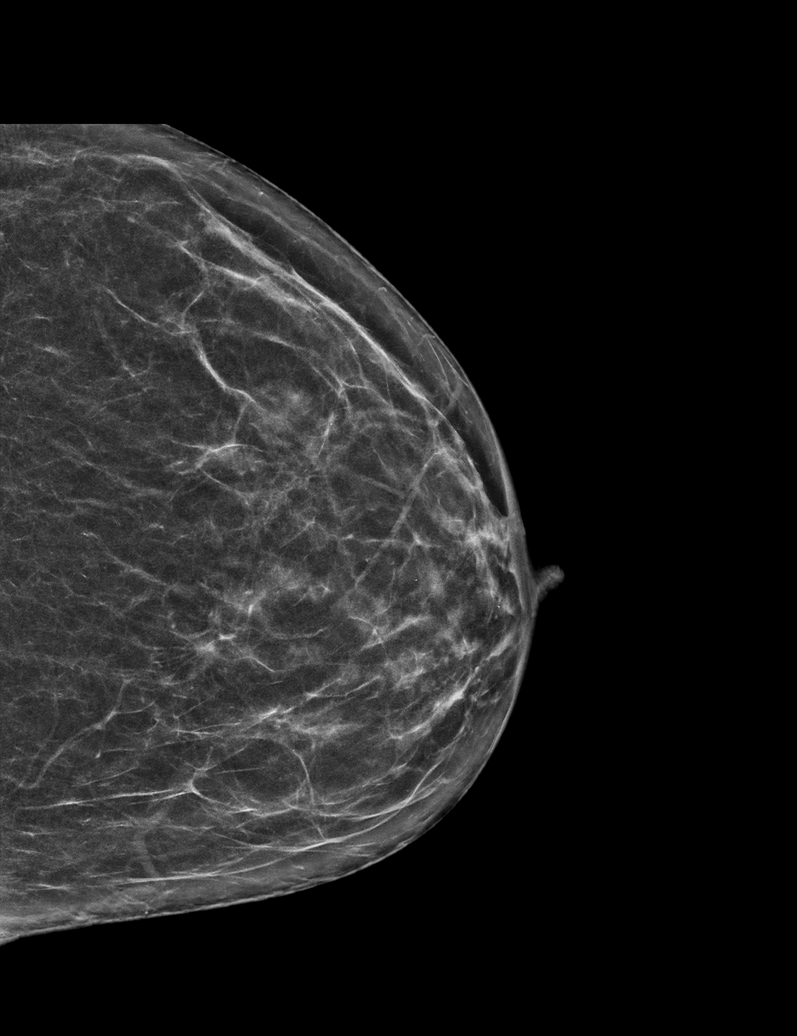

[L MLO synth-2D (1 of 2)]
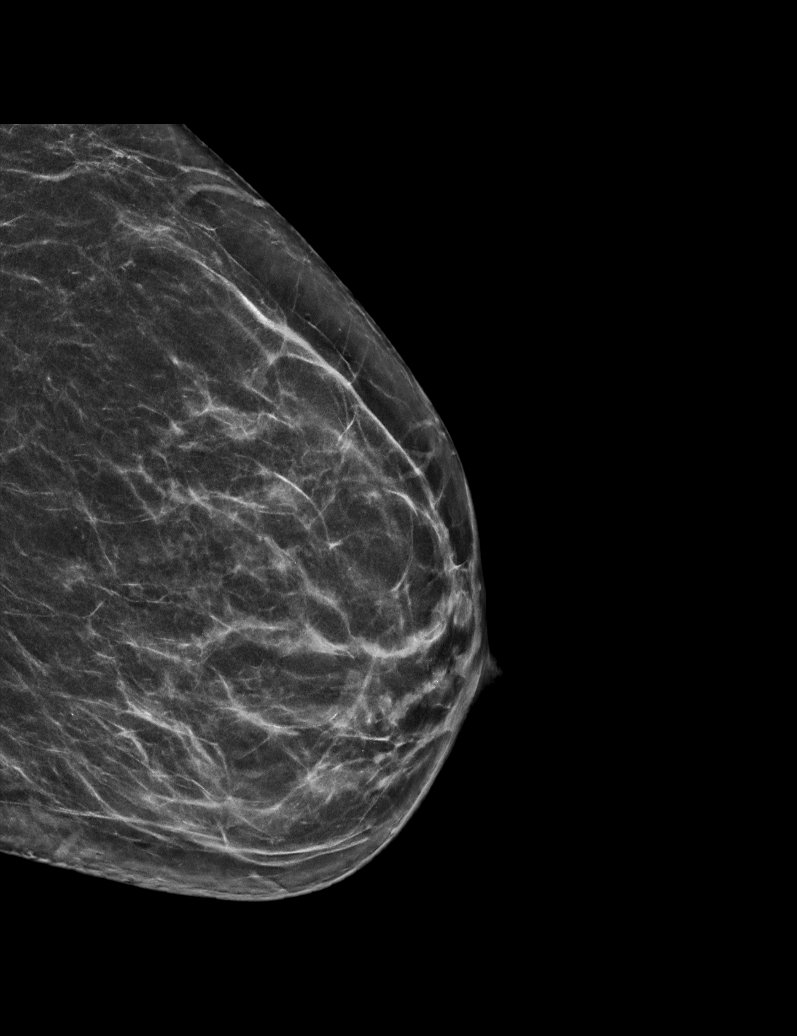

[L MLO synth-2D (2 of 2)]
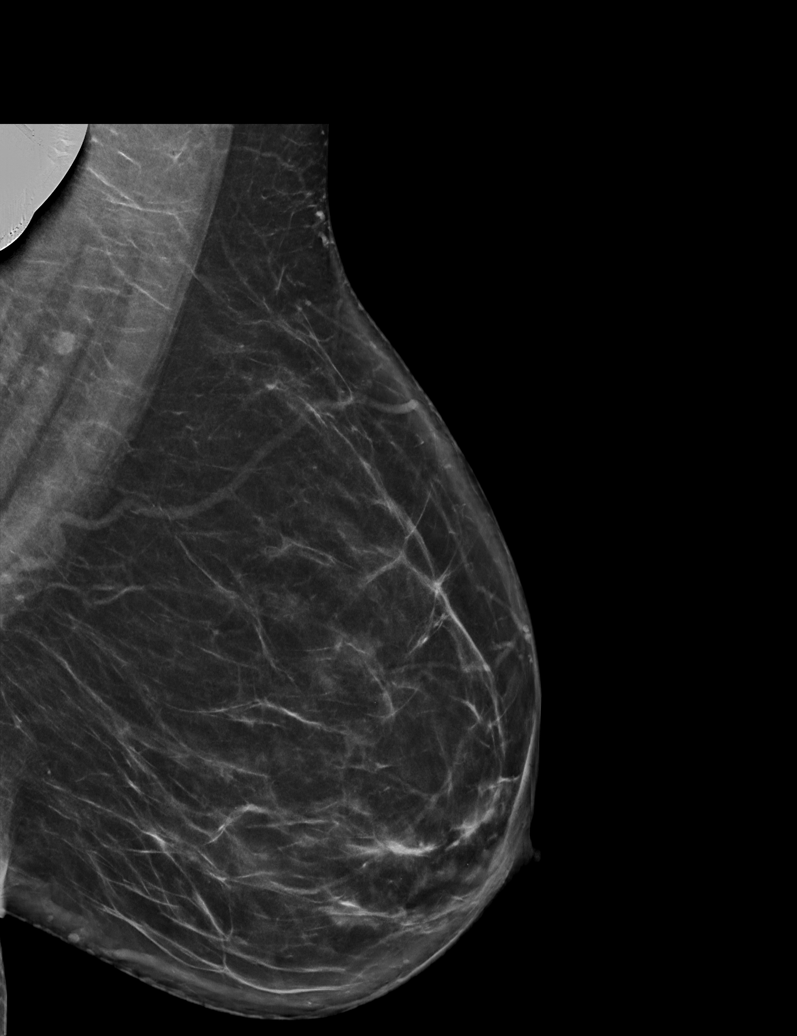

[R CC synth-2D]
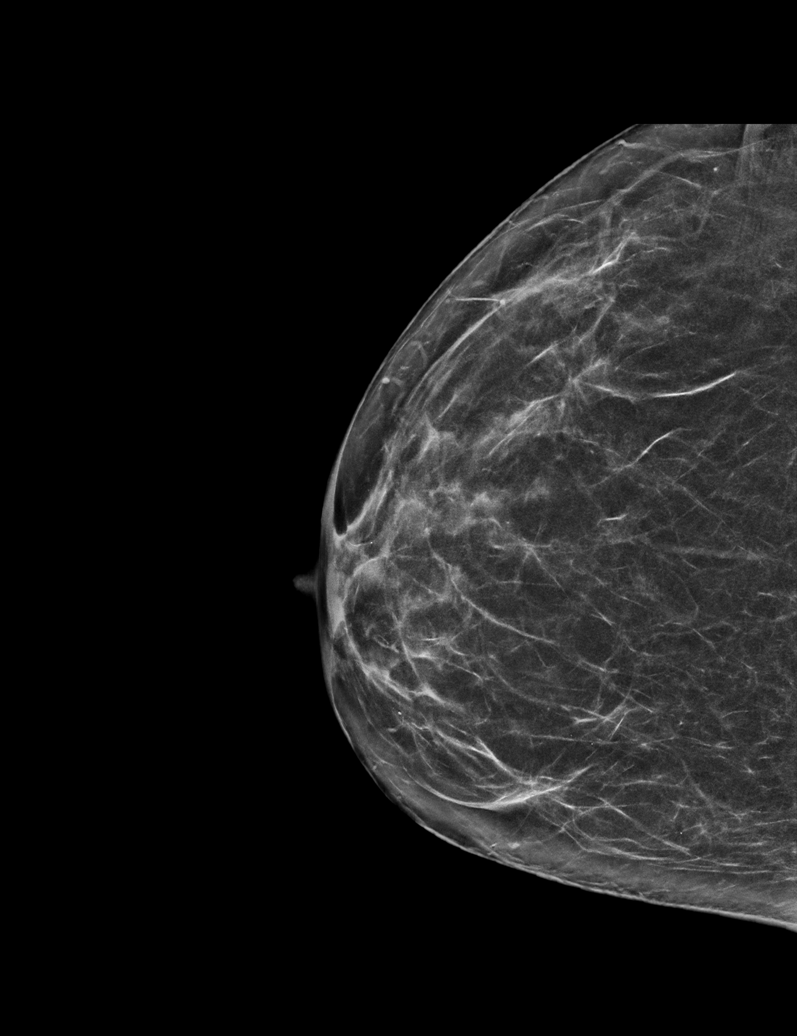

[L MLO tomo · tomo slice 30/59.0]
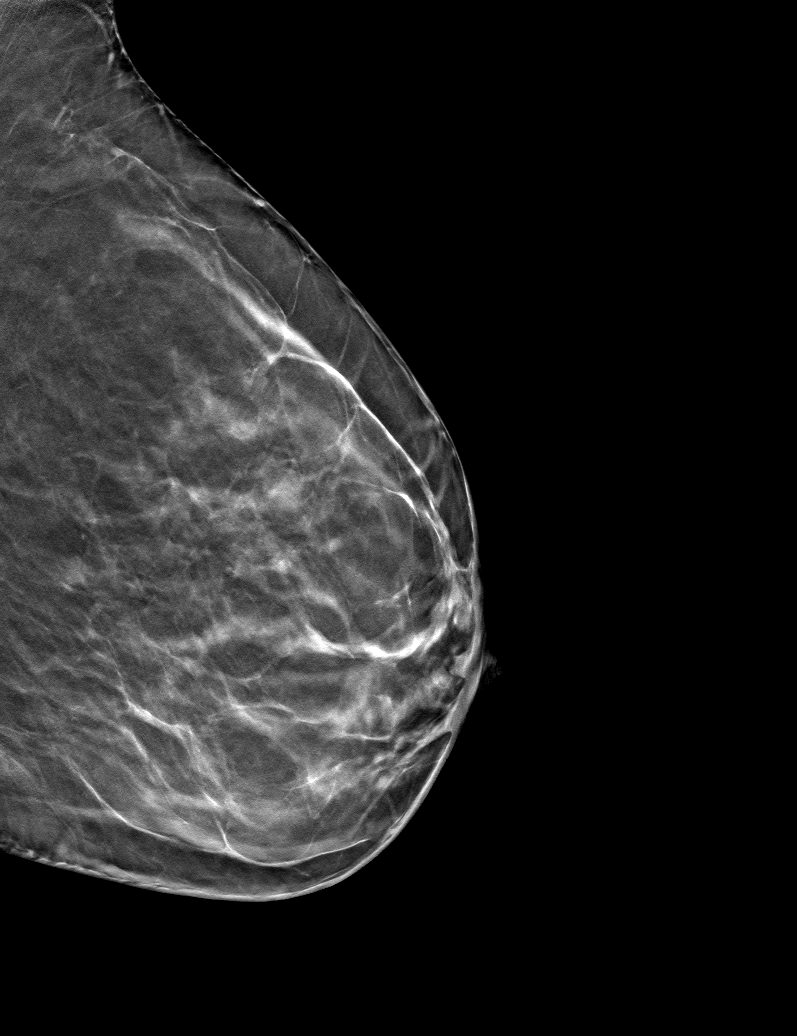

[6 of 30 positions shown; findings below may reference images not displayed]

ACR Breast Density Category b: There are scattered areas of
fibroglandular density.
FINDINGS: There are no findings suspicious for malignancy. The images were
evaluated with computer-aided detection.
IMPRESSION: No mammographic evidence of malignancy. A result letter of this
screening mammogram will be mailed directly to the patient.

RECOMMENDATION:
Screening mammogram in one year. (Code:[OD])

BI-RADS CATEGORY  1: Negative.

## 2020-10-07 ENCOUNTER — Other Ambulatory Visit: Payer: Self-pay | Admitting: Family Medicine

## 2020-10-31 ENCOUNTER — Encounter: Payer: Self-pay | Admitting: Family Medicine

## 2020-10-31 DIAGNOSIS — F3342 Major depressive disorder, recurrent, in full remission: Secondary | ICD-10-CM

## 2020-10-31 MED ORDER — VENLAFAXINE HCL ER 75 MG PO CP24: ORAL_CAPSULE | ORAL | 1 refills | Status: DC

## 2020-10-31 MED ORDER — VENLAFAXINE HCL ER 150 MG PO CP24: 150.0000 mg | ORAL_CAPSULE | Freq: Every day | ORAL | 1 refills | Status: DC

## 2020-11-08 ENCOUNTER — Ambulatory Visit (INDEPENDENT_AMBULATORY_CARE_PROVIDER_SITE_OTHER): Payer: Medicare PPO

## 2020-11-08 DIAGNOSIS — I442 Atrioventricular block, complete: Secondary | ICD-10-CM

## 2020-11-09 LAB — CUP PACEART REMOTE DEVICE CHECK
Battery Remaining Longevity: 30 mo
Battery Voltage: 2.96 V
Brady Statistic AP VP Percent: 14.15 %
Brady Statistic AP VS Percent: 0.01 %
Brady Statistic AS VP Percent: 85.83 %
Brady Statistic AS VS Percent: 0.01 %
Brady Statistic RA Percent Paced: 14.15 %
Brady Statistic RV Percent Paced: 99.97 %
Date Time Interrogation Session: 20220609112227
Implantable Lead Implant Date: 20070319
Implantable Lead Implant Date: 20070319
Implantable Lead Location: 753859
Implantable Lead Location: 753860
Implantable Lead Model: 5076
Implantable Lead Model: 5076
Implantable Pulse Generator Implant Date: 20160210
Lead Channel Impedance Value: 304 Ohm
Lead Channel Impedance Value: 418 Ohm
Lead Channel Impedance Value: 437 Ohm
Lead Channel Impedance Value: 475 Ohm
Lead Channel Pacing Threshold Amplitude: 0.625 V
Lead Channel Pacing Threshold Amplitude: 1 V
Lead Channel Pacing Threshold Pulse Width: 0.4 ms
Lead Channel Pacing Threshold Pulse Width: 0.4 ms
Lead Channel Sensing Intrinsic Amplitude: 3.75 mV
Lead Channel Sensing Intrinsic Amplitude: 3.75 mV
Lead Channel Sensing Intrinsic Amplitude: 5.125 mV
Lead Channel Sensing Intrinsic Amplitude: 5.125 mV
Lead Channel Setting Pacing Amplitude: 2 V
Lead Channel Setting Pacing Amplitude: 2.5 V
Lead Channel Setting Pacing Pulse Width: 0.4 ms
Lead Channel Setting Sensing Sensitivity: 2 mV

## 2020-11-27 DIAGNOSIS — M5412 Radiculopathy, cervical region: Secondary | ICD-10-CM | POA: Diagnosis not present

## 2020-11-27 DIAGNOSIS — M47812 Spondylosis without myelopathy or radiculopathy, cervical region: Secondary | ICD-10-CM | POA: Diagnosis not present

## 2020-11-30 NOTE — Progress Notes (Signed)
Remote pacemaker transmission.   

## 2020-12-21 DIAGNOSIS — M5412 Radiculopathy, cervical region: Secondary | ICD-10-CM | POA: Diagnosis not present

## 2021-01-08 ENCOUNTER — Encounter: Payer: Self-pay | Admitting: Family Medicine

## 2021-01-08 DIAGNOSIS — M25849 Other specified joint disorders, unspecified hand: Secondary | ICD-10-CM

## 2021-01-11 DIAGNOSIS — M67442 Ganglion, left hand: Secondary | ICD-10-CM | POA: Diagnosis not present

## 2021-01-11 DIAGNOSIS — M67441 Ganglion, right hand: Secondary | ICD-10-CM | POA: Diagnosis not present

## 2021-01-14 ENCOUNTER — Other Ambulatory Visit: Payer: Self-pay | Admitting: Family Medicine

## 2021-01-14 NOTE — Telephone Encounter (Signed)
Requested Prescriptions  Pending Prescriptions Disp Refills  . meloxicam (MOBIC) 15 MG tablet [Pharmacy Med Name: MELOXICAM '15MG'$  TABLETS] 90 tablet 1    Sig: TAKE 1 TABLET(15 MG) BY MOUTH DAILY     Analgesics:  COX2 Inhibitors Passed - 01/14/2021 11:12 AM      Passed - HGB in normal range and within 360 days    Hemoglobin  Date Value Ref Range Status  07/10/2020 13.8 11.1 - 15.9 g/dL Final         Passed - Cr in normal range and within 360 days    Creatinine  Date Value Ref Range Status  04/03/2012 0.97 0.60 - 1.30 mg/dL Final   Creatinine, Ser  Date Value Ref Range Status  07/10/2020 0.81 0.57 - 1.00 mg/dL Final         Passed - Patient is not pregnant      Passed - Valid encounter within last 12 months    Recent Outpatient Visits          4 months ago Depression, recurrent ([place])   [place], [place], DO   5 months ago Depression, recurrent ([place])   Gonzalez, Megan P, DO   6 months ago Routine general medical examination at a health care facility   [place], [place], DO   6 months ago Blue toes   [place], DO   9 months ago Other cervical disc degeneration, unspecified cervical region   [place], [place], DO      Future Appointments            In 1 month Caryl Comes, Revonda Standard, MD [place], [facility]   In 1 month [place], Barb Merino, DO [place], [place]   In 6 months  [employer], [place]

## 2021-01-18 DIAGNOSIS — M67442 Ganglion, left hand: Secondary | ICD-10-CM | POA: Diagnosis not present

## 2021-01-18 DIAGNOSIS — M71342 Other bursal cyst, left hand: Secondary | ICD-10-CM | POA: Diagnosis not present

## 2021-02-07 ENCOUNTER — Ambulatory Visit (INDEPENDENT_AMBULATORY_CARE_PROVIDER_SITE_OTHER): Payer: Medicare PPO

## 2021-02-07 DIAGNOSIS — I442 Atrioventricular block, complete: Secondary | ICD-10-CM

## 2021-02-07 LAB — CUP PACEART REMOTE DEVICE CHECK
Battery Remaining Longevity: 28 mo
Battery Voltage: 2.95 V
Brady Statistic AP VP Percent: 14.63 %
Brady Statistic AP VS Percent: 0.01 %
Brady Statistic AS VP Percent: 85.35 %
Brady Statistic AS VS Percent: 0.01 %
Brady Statistic RA Percent Paced: 14.63 %
Brady Statistic RV Percent Paced: 99.98 %
Date Time Interrogation Session: 20220907091351
Implantable Lead Implant Date: 20070319
Implantable Lead Implant Date: 20070319
Implantable Lead Location: 753859
Implantable Lead Location: 753860
Implantable Lead Model: 5076
Implantable Lead Model: 5076
Implantable Pulse Generator Implant Date: 20160210
Lead Channel Impedance Value: 304 Ohm
Lead Channel Impedance Value: 418 Ohm
Lead Channel Impedance Value: 456 Ohm
Lead Channel Impedance Value: 475 Ohm
Lead Channel Pacing Threshold Amplitude: 0.75 V
Lead Channel Pacing Threshold Amplitude: 1 V
Lead Channel Pacing Threshold Pulse Width: 0.4 ms
Lead Channel Pacing Threshold Pulse Width: 0.4 ms
Lead Channel Sensing Intrinsic Amplitude: 2.875 mV
Lead Channel Sensing Intrinsic Amplitude: 2.875 mV
Lead Channel Sensing Intrinsic Amplitude: 26.75 mV
Lead Channel Sensing Intrinsic Amplitude: 26.75 mV
Lead Channel Setting Pacing Amplitude: 2 V
Lead Channel Setting Pacing Amplitude: 2.5 V
Lead Channel Setting Pacing Pulse Width: 0.4 ms
Lead Channel Setting Sensing Sensitivity: 2 mV

## 2021-02-08 DIAGNOSIS — M67441 Ganglion, right hand: Secondary | ICD-10-CM | POA: Diagnosis not present

## 2021-02-15 NOTE — Progress Notes (Signed)
Remote pacemaker transmission.   

## 2021-02-19 ENCOUNTER — Other Ambulatory Visit: Payer: Self-pay | Admitting: Family Medicine

## 2021-02-20 ENCOUNTER — Encounter: Payer: Medicare PPO | Admitting: Internal Medicine

## 2021-02-28 ENCOUNTER — Ambulatory Visit: Payer: Medicare PPO | Admitting: Internal Medicine

## 2021-03-02 ENCOUNTER — Encounter: Payer: Self-pay | Admitting: Internal Medicine

## 2021-03-02 ENCOUNTER — Ambulatory Visit: Payer: Medicare PPO | Admitting: Internal Medicine

## 2021-03-02 ENCOUNTER — Other Ambulatory Visit: Payer: Self-pay

## 2021-03-02 VITALS — BP 112/78 | HR 88 | Ht 63.5 in | Wt 162.6 lb

## 2021-03-02 DIAGNOSIS — Z950 Presence of cardiac pacemaker: Secondary | ICD-10-CM | POA: Diagnosis not present

## 2021-03-02 DIAGNOSIS — I471 Supraventricular tachycardia: Secondary | ICD-10-CM | POA: Diagnosis not present

## 2021-03-02 DIAGNOSIS — I442 Atrioventricular block, complete: Secondary | ICD-10-CM

## 2021-03-02 DIAGNOSIS — I4719 Other supraventricular tachycardia: Secondary | ICD-10-CM

## 2021-03-02 DIAGNOSIS — G90A Postural orthostatic tachycardia syndrome (POTS): Secondary | ICD-10-CM

## 2021-03-02 DIAGNOSIS — I10 Essential (primary) hypertension: Secondary | ICD-10-CM | POA: Insufficient documentation

## 2021-03-02 DIAGNOSIS — I498 Other specified cardiac arrhythmias: Secondary | ICD-10-CM | POA: Diagnosis not present

## 2021-03-02 NOTE — Progress Notes (Signed)
Patient Care Team: Valerie Roys, DO as PCP - General (Family Medicine)   HPI  Angela [name] is a 50 y.o. 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   Because of some degree of atrial tachycardia/sinus tachycardia we have tried reprogrammed her device DDD--DDI. She did not tolerate this at all. We subsequently programmed her back with a lower upper tracking rate  She underwent device revision surgery 2/16  having reverted to VVI and felt terrible.  She is much improved. The heat however remains a problem. Lower extremity edema is issue also.   Continues with dyspnea.  Has been able to lose 30 pounds.  Is on her way for another 15.  Excited.  Mental health is much improved.  Her new complaint is that she has purple toes and that they hurt.  She underwent vascular testing which showed decreased perfusion to her toes.   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 EF 50-55% normal coronaries   Complete heart block (HCC)    Depression    Dysautonomia (HCC)    Dyspnea    CT negative for PE, 2007   Hypertension    Insomnia    OCD (obsessive compulsive disorder)    SVT (supraventricular tachycardia) (HCC)    s/p ablation a. c/b AV nod ablation requiring pacemaker    Past Surgical History:  Procedure Laterality Date   BREAST BIOPSY Right 1998   BREAST BIOPSY Right 2004   BREAST LUMPECTOMY     RIGHT -BREAST    COLONOSCOPY WITH PROPOFOL N/A 09/07/2019   Procedure: COLONOSCOPY WITH PROPOFOL;  Surgeon: Lucilla Lame, MD;  Location: [facility] ENDOSCOPY;  Service: Endoscopy;  Laterality: N/A;   ENDOMETRIAL ABLATION  2005   INSERT / REPLACE / REMOVE PACEMAKER     PELVIC  LAPAROSCOPY  1990   PERMANENT PACEMAKER GENERATOR CHANGE N/A 07/13/2014   MDT MRI compatible dual chamber pacemaker implanted by Dr Jeralene Peters  2005   TUBAL LIGATION  2003    Current Outpatient Medications  Medication Sig Dispense Refill   Cyanocobalamin (VITAMIN B 12) 500 MCG TABS Take 1,000 mcg by mouth daily.     cyclobenzaprine (FLEXERIL) 5 MG tablet Take by mouth.     linaclotide (LINZESS) 72 MCG capsule Take 1 capsule (72 mcg total) by mouth daily. 90 capsule 2   LINZESS 145 MCG CAPS capsule TAKE 1 CAPSULE(145 MCG) BY MOUTH DAILY BEFORE BREAKFAST 30 capsule 6   LORazepam (ATIVAN) 0.5 MG tablet TAKE 1 TABLET(0.5 MG) BY MOUTH DAILY AS NEEDED FOR ANXIETY 30 tablet 0   meclizine (ANTIVERT) 25 MG tablet Take 1 tablet (25 mg total) by mouth 3 (three) times daily as needed for dizziness. 90 tablet 0   meloxicam (MOBIC) 15 MG tablet TAKE 1 TABLET(15 MG) BY MOUTH DAILY 90 tablet 1   metoprolol tartrate (LOPRESSOR) 50 MG tablet Take 1 tablet (50 mg total) by mouth 2 (two) times daily. 180 tablet 1   Multiple Vitamins-Minerals (MULTIVITAMINS THER. W/MINERALS) TABS Take 1 tablet by mouth daily.     traMADol (  ULTRAM) 50 MG tablet      venlafaxine XR (EFFEXOR XR) 75 MG 24 hr capsule Take 1 tab daily with her 150mg  for a total of 225mg  daily 90 capsule 1   venlafaxine XR (EFFEXOR-XR) 150 MG 24 hr capsule Take 1 capsule (150 mg total) by mouth daily with breakfast. 90 capsule 1   buPROPion (WELLBUTRIN SR) 150 MG 12 hr tablet TAKE 1 TABLET(150 MG) BY MOUTH TWICE DAILY (Patient not taking: Reported on 03/02/2021) 180 tablet 0   No current facility-administered medications for this visit.    Allergies  Allergen Reactions   Toprol Xl [Metoprolol Succinate] Photosensitivity    Review of Systems negative except from HPI and PMH BP 112/78   Pulse 88   Ht 5' 3.5" (1.613 m)   Wt 162 lb 9.6 oz (73.8 kg)   SpO2 98%   BMI 28.35 kg/m   Well developed and well nourished in no acute distress HENT  normal Neck supple with JVP-flat Clear Device pocket well healed; without hematoma or erythema.  There is no tethering  Regular rate and rhythm, no is a murmur Abd-soft with active BS No Clubbing cyanosis knee edema Skin-warm and dry A & Oriented  Grossly normal sensory and motor function  ECG sinus at 88 Intervals 19/15/42     Assessment and  Plan  Complete heart block  Pacemaker Medtronic     POTS  Obesity  Anxiety/depression    Raynaud's  Atrial tachycardia/  Elevated blood pressure  Ventricular sensed events question mechanism  No significant atrial arrhythmias.  Hence, in the context of her Raynaud's we will plan to wean her off of her beta-blocker.  I went back as far as I could into the records 2008 she was on beta-blockers at that time.  As noted above, she has had some problem atrial tachycardia and some problems with elevated blood pressure.  In the event that either these ensue, and she will be checking her blood pressure at home, we will try low-dose calcium blockers in their stead  Mental health is much improved.  POTS is quiescient.  Continue fluid but not sodium intake.

## 2021-03-02 NOTE — Patient Instructions (Addendum)
Medication Instructions:  Your physician has recommended you make the following change in your medication:   You will wean off your metoprolol 50 mg-    -Take 1/2 tablet 25 mg x 7 days  -Then reduce to 1/2 tablet 25 mg every other day for 10 days  -Then stop   Labwork: None ordered.  Testing/Procedures: None ordered.  Follow-Up: Your physician wants you to follow-up in: one year with Dr. Caryl Comes or one of the following Advanced Practice Providers on your designated Care Team:   Tommye Standard, [place] Legrand Como "Jonni Sanger" Chalmers Cater, [place]  Remote monitoring is used to monitor your Pacemaker from home. This monitoring reduces the number of office visits required to check your device to one time per year. It allows [place] to keep an eye on the functioning of your device to ensure it is working properly. You are scheduled for a device check from home on 05/09/2021. You may send your transmission at any time that day. If you have a wireless device, the transmission will be sent automatically. After your physician reviews your transmission, you will receive a postcard with your next transmission date.  Any Other Special Instructions Will Be Listed Below (If Applicable).  If you need a refill on your cardiac medications before your next appointment, please call your pharmacy.

## 2021-03-07 ENCOUNTER — Other Ambulatory Visit: Payer: Self-pay | Admitting: Gastroenterology

## 2021-03-08 ENCOUNTER — Encounter: Payer: Self-pay | Admitting: Family Medicine

## 2021-03-08 ENCOUNTER — Ambulatory Visit: Payer: Medicare PPO | Admitting: Family Medicine

## 2021-03-08 ENCOUNTER — Other Ambulatory Visit: Payer: Self-pay

## 2021-03-08 VITALS — BP 102/68 | HR 92 | Temp 97.5°F | Resp 16 | Ht 64.5 in | Wt 157.4 lb

## 2021-03-08 DIAGNOSIS — Z23 Encounter for immunization: Secondary | ICD-10-CM

## 2021-03-08 DIAGNOSIS — F339 Major depressive disorder, recurrent, unspecified: Secondary | ICD-10-CM | POA: Diagnosis not present

## 2021-03-08 DIAGNOSIS — F3342 Major depressive disorder, recurrent, in full remission: Secondary | ICD-10-CM | POA: Diagnosis not present

## 2021-03-08 DIAGNOSIS — G901 Familial dysautonomia [Riley-Day]: Secondary | ICD-10-CM | POA: Diagnosis not present

## 2021-03-08 MED ORDER — VENLAFAXINE HCL ER 150 MG PO CP24: 150.0000 mg | ORAL_CAPSULE | Freq: Every day | ORAL | 1 refills | Status: DC

## 2021-03-08 MED ORDER — VENLAFAXINE HCL ER 75 MG PO CP24: ORAL_CAPSULE | ORAL | 1 refills | Status: DC

## 2021-03-08 NOTE — Assessment & Plan Note (Signed)
Under good control on current regimen. Continue current regimen. Continue to monitor. Call with any concerns. Refills given.   

## 2021-03-08 NOTE — Assessment & Plan Note (Signed)
Continues to follow with cardiology. Call with any concerns. Continue to monitor.

## 2021-03-08 NOTE — Progress Notes (Signed)
BP 102/68 (BP Location: Left Arm, Patient Position: Sitting, Cuff Size: Large)   Pulse 92   Temp (!) 97.5 F (36.4 C) (Oral)   Resp 16   Ht 5' 4.5" (1.638 m)   Wt 157 lb 6.4 oz (71.4 kg)   SpO2 98%   BMI 26.60 kg/m    Subjective:    Patient ID: Angela [name], female    DOB: 1970-12-14, 50 y.o.   MRN: [id number]  HPI: Angela [name] is a 50 y.o. female  Chief Complaint  Patient presents with   Depression   DEPRESSION Mood status: better Satisfied with current treatment?: yes Symptom severity: mild  Duration of current treatment : chronic Side effects: no Medication compliance: excellent compliance Psychotherapy/counseling: no  Previous psychiatric medications: effexor Depressed mood: yes Anxious mood: no Anhedonia: no Significant weight loss or gain: no Insomnia: no  Fatigue: yes Feelings of worthlessness or guilt: no Impaired concentration/indecisiveness: no Suicidal ideations: no Hopelessness: no Crying spells: no Depression screen [facility] 2/9 03/08/2021 09/06/2020 08/08/2020 07/21/2020 06/27/2020  Decreased Interest 0 1 1 0 0  Down, Depressed, Hopeless 0 1 2 0 1  PHQ - 2 Score 0 2 3 0 1  Altered sleeping 1 2 2  - 3  Tired, decreased energy 3 1 2  - 3  Change in appetite 0 0 2 - 0  Feeling bad or failure about yourself  - 0 0 - 0  Trouble concentrating 0 2 2 - 3  Moving slowly or fidgety/restless 0 0 0 - 0  Suicidal thoughts 0 0 0 - 0  PHQ-9 Score 4 7 11  - 10  Difficult doing work/chores Somewhat difficult Somewhat difficult Somewhat difficult - Not difficult at all  Some recent data might be hidden    Relevant past medical, surgical, family and social history reviewed and updated as indicated. Interim medical history since our last visit reviewed. Allergies and medications reviewed and updated.  Review of Systems  Constitutional: Negative.   Respiratory: Negative.    Cardiovascular: Negative.   Gastrointestinal: Negative.   Genitourinary: Negative.    Neurological: Negative.   Psychiatric/Behavioral: Negative.     Per HPI unless specifically indicated above     Objective:    BP 102/68 (BP Location: Left Arm, Patient Position: Sitting, Cuff Size: Large)   Pulse 92   Temp (!) 97.5 F (36.4 C) (Oral)   Resp 16   Ht 5' 4.5" (1.638 m)   Wt 157 lb 6.4 oz (71.4 kg)   SpO2 98%   BMI 26.60 kg/m   Wt Readings from Last 3 Encounters:  03/08/21 157 lb 6.4 oz (71.4 kg)  03/02/21 162 lb 9.6 oz (73.8 kg)  09/06/20 167 lb 12.8 oz (76.1 kg)    Physical Exam Vitals and nursing note reviewed.  Constitutional:      General: She is not in acute distress.    Appearance: Normal appearance. She is not ill-appearing, toxic-appearing or diaphoretic.  HENT:     Head: Normocephalic and atraumatic.     Right Ear: External ear normal.     Left Ear: External ear normal.     Nose: Nose normal.     Mouth/Throat:     Mouth: Mucous membranes are moist.     Pharynx: Oropharynx is clear.  Eyes:     General: No scleral icterus.       Right eye: No discharge.        Left eye: No discharge.     Extraocular Movements: Extraocular  movements intact.     Conjunctiva/sclera: Conjunctivae normal.     Pupils: Pupils are equal, round, and reactive to light.  Cardiovascular:     Rate and Rhythm: Normal rate and regular rhythm.     Pulses: Normal pulses.     Heart sounds: Normal heart sounds. No murmur heard.   No friction rub. No gallop.  Pulmonary:     Effort: Pulmonary effort is normal. No respiratory distress.     Breath sounds: Normal breath sounds. No stridor. No wheezing, rhonchi or rales.  Chest:     Chest wall: No tenderness.  Musculoskeletal:        General: Normal range of motion.     Cervical back: Normal range of motion and neck supple.  Skin:    General: Skin is warm and dry.     Capillary Refill: Capillary refill takes less than 2 seconds.     Coloration: Skin is not jaundiced or pale.     Findings: No bruising, erythema, lesion or  rash.  Neurological:     General: No focal deficit present.     Mental Status: She is alert and oriented to person, place, and time. Mental status is at baseline.  Psychiatric:        Mood and Affect: Mood normal.        Behavior: Behavior normal.        Thought Content: Thought content normal.        Judgment: Judgment normal.    Results for orders placed or performed in visit on 02/07/21  CUP PACEART REMOTE DEVICE CHECK  Result Value Ref Range   Date Time Interrogation Session [id number]    Pulse Generator Manufacturer MERM    Pulse Gen Model A2DR01 Advisa DR MRI    Pulse Gen Serial Number [id number]    Clinic Name [place] Pulse Generator Type Implantable Pulse Generator    Implantable Pulse Generator Implant Date [id number]    Implantable Lead Manufacturer MERM    Implantable Lead Model 5076 CapSureFix Novus    Implantable Lead Serial Number [id number]    Implantable Lead Implant Date [id number]    Implantable Lead Location Detail 1 APPENDAGE    Implantable Lead Location [id number]    Implantable Lead Manufacturer MERM    Implantable Lead Model 5076 CapSureFix Novus    Implantable Lead Serial Number [id number]    Implantable Lead Implant Date 38937342    Implantable Lead Location Detail 1 APEX    Implantable Lead Location [id number]    Lead Channel Setting Sensing Sensitivity 2 mV   Lead Channel Setting Pacing Amplitude 2 V   Lead Channel Setting Pacing Pulse Width 0.4 ms   Lead Channel Setting Pacing Amplitude 2.5 V   Lead Channel Impedance Value 456 ohm   Lead Channel Impedance Value 304 ohm   Lead Channel Sensing Intrinsic Amplitude 2.875 mV   Lead Channel Sensing Intrinsic Amplitude 2.875 mV   Lead Channel Pacing Threshold Amplitude 1 V   Lead Channel Pacing Threshold Pulse Width 0.4 ms   Lead Channel Impedance Value 475 ohm   Lead Channel Impedance Value 418 ohm   Lead Channel Sensing Intrinsic Amplitude 26.75 mV   Lead Channel Sensing Intrinsic  Amplitude 26.75 mV   Lead Channel Pacing Threshold Amplitude 0.75 V   Lead Channel Pacing Threshold Pulse Width 0.4 ms   Battery Status OK    Battery Remaining Longevity 28 mo   Battery Voltage 2.95 V   [employer]  RA Percent Paced 14.63 %   Brady Statistic RV Percent Paced 99.98 %   Brady Statistic AP VP Percent 14.63 %   Brady Statistic AS VP Percent 85.35 %   Brady Statistic AP VS Percent 0.01 %   Brady Statistic AS VS Percent 0.01 %      Assessment & Plan:   Problem List Items Addressed This Visit       Nervous and Auditory   Dysautonomia ([place])    Continues to follow with cardiology. Call with any concerns. Continue to monitor.         Other   Depression, recurrent ([place])    Under good control on current regimen. Continue current regimen. Continue to monitor. Call with any concerns. Refills given.        Relevant Medications   venlafaxine XR (EFFEXOR-XR) 150 MG 24 hr capsule   venlafaxine XR (EFFEXOR XR) 75 MG 24 hr capsule   Other Visit Diagnoses     Needs flu shot    -  Primary   Relevant Orders   Flu Vaccine QUAD 6+ mos PF IM (Fluarix Quad PF)   Recurrent major depressive disorder, in full remission (HCC)       Relevant Medications   venlafaxine XR (EFFEXOR-XR) 150 MG 24 hr capsule   venlafaxine XR (EFFEXOR XR) 75 MG 24 hr capsule        Follow up plan: Return February Physical.

## 2021-03-19 DIAGNOSIS — M5412 Radiculopathy, cervical region: Secondary | ICD-10-CM | POA: Diagnosis not present

## 2021-04-17 ENCOUNTER — Other Ambulatory Visit: Payer: Self-pay

## 2021-04-17 ENCOUNTER — Ambulatory Visit: Payer: Medicare PPO | Admitting: Family Medicine

## 2021-04-17 ENCOUNTER — Encounter: Payer: Self-pay | Admitting: Family Medicine

## 2021-04-17 VITALS — BP 145/90 | HR 86 | Temp 98.1°F | Wt 158.0 lb

## 2021-04-17 DIAGNOSIS — N6321 Unspecified lump in the left breast, upper outer quadrant: Secondary | ICD-10-CM

## 2021-04-17 NOTE — Progress Notes (Signed)
BP (!) 145/90   Pulse 86   Temp 98.1 F (36.7 C)   Wt 158 lb (71.7 kg)   SpO2 99%   BMI 26.70 kg/m    Subjective:    Patient ID: Angela [name], female    DOB: 05-28-1971, 50 y.o.   MRN: [id number]  HPI: Angela [name] is a 50 y.o. female  Chief Complaint  Patient presents with   Breast Pain    Patient states she has left breast tenderness, patient states she feels one knot.    BREAST PAIN Duration : 5 days Location: L upper outer quadrant Onset: sudden Severity: moderate Quality: aching Frequency: just if she touches it Redness: no Swelling: no Trauma: no trauma Breastfeeding: no Associated with menstral cycle: no Nipple discharge: no Breast lump: yes Status: stable Treatments attempted: none Previous mammogram: yes  Relevant past medical, surgical, family and social history reviewed and updated as indicated. Interim medical history since our last visit reviewed. Allergies and medications reviewed and updated.  Review of Systems  Constitutional: Negative.   Respiratory: Negative.    Cardiovascular: Negative.   Gastrointestinal: Negative.   Genitourinary: Negative.   Musculoskeletal: Negative.   Neurological: Negative.   Psychiatric/Behavioral: Negative.     Per HPI unless specifically indicated above     Objective:    BP (!) 145/90   Pulse 86   Temp 98.1 F (36.7 C)   Wt 158 lb (71.7 kg)   SpO2 99%   BMI 26.70 kg/m   Wt Readings from Last 3 Encounters:  04/17/21 158 lb (71.7 kg)  03/08/21 157 lb 6.4 oz (71.4 kg)  03/02/21 162 lb 9.6 oz (73.8 kg)    Physical Exam Vitals and nursing note reviewed.  Constitutional:      General: She is not in acute distress.    Appearance: Normal appearance. She is not ill-appearing, toxic-appearing or diaphoretic.  HENT:     Head: Normocephalic and atraumatic.     Right Ear: External ear normal.     Left Ear: External ear normal.     Nose: Nose normal.     Mouth/Throat:     Mouth: Mucous  membranes are moist.     Pharynx: Oropharynx is clear.  Eyes:     General: No scleral icterus.       Right eye: No discharge.        Left eye: No discharge.     Extraocular Movements: Extraocular movements intact.     Conjunctiva/sclera: Conjunctivae normal.     Pupils: Pupils are equal, round, and reactive to light.  Cardiovascular:     Rate and Rhythm: Normal rate and regular rhythm.     Pulses: Normal pulses.     Heart sounds: Normal heart sounds. No murmur heard.   No friction rub. No gallop.  Pulmonary:     Effort: Pulmonary effort is normal. No respiratory distress.     Breath sounds: Normal breath sounds. No stridor. No wheezing, rhonchi or rales.  Chest:     Chest wall: No tenderness.    Musculoskeletal:        General: Normal range of motion.     Cervical back: Normal range of motion and neck supple.  Skin:    General: Skin is warm and dry.     Capillary Refill: Capillary refill takes less than 2 seconds.     Coloration: Skin is not jaundiced or pale.     Findings: No bruising, erythema, lesion or rash.  Neurological:  General: No focal deficit present.     Mental Status: She is alert and oriented to person, place, and time. Mental status is at baseline.  Psychiatric:        Mood and Affect: Mood normal.        Behavior: Behavior normal.        Thought Content: Thought content normal.        Judgment: Judgment normal.    Results for orders placed or performed in visit on 02/07/21  CUP PACEART REMOTE DEVICE CHECK  Result Value Ref Range   Date Time Interrogation Session [id number]    Pulse Generator Manufacturer MERM    Pulse Gen Model [id number] Advisa DR MRI    Pulse Gen Serial Number [id number]    Clinic Name [facility] Pulse Generator Type Implantable Pulse Generator    Implantable Pulse Generator Implant Date [id number]    Implantable Lead Manufacturer MERM    Implantable Lead Model 5076 CapSureFix Novus    Implantable Lead Serial  Number [id number]    Implantable Lead Implant Date [id number]    Implantable Lead Location Detail 1 APPENDAGE    Implantable Lead Location [id number]    Implantable Lead Manufacturer MERM    Implantable Lead Model 5076 CapSureFix Novus    Implantable Lead Serial Number [id number]    Implantable Lead Implant Date 14239532    Implantable Lead Location Detail 1 APEX    Implantable Lead Location [id number]    Lead Channel Setting Sensing Sensitivity 2 mV   Lead Channel Setting Pacing Amplitude 2 V   Lead Channel Setting Pacing Pulse Width 0.4 ms   Lead Channel Setting Pacing Amplitude 2.5 V   Lead Channel Impedance Value 456 ohm   Lead Channel Impedance Value 304 ohm   Lead Channel Sensing Intrinsic Amplitude 2.875 mV   Lead Channel Sensing Intrinsic Amplitude 2.875 mV   Lead Channel Pacing Threshold Amplitude 1 V   Lead Channel Pacing Threshold Pulse Width 0.4 ms   Lead Channel Impedance Value 475 ohm   Lead Channel Impedance Value 418 ohm   Lead Channel Sensing Intrinsic Amplitude 26.75 mV   Lead Channel Sensing Intrinsic Amplitude 26.75 mV   Lead Channel Pacing Threshold Amplitude 0.75 V   Lead Channel Pacing Threshold Pulse Width 0.4 ms   Battery Status OK    Battery Remaining Longevity 28 mo   Battery Voltage 2.95 V   Brady Statistic RA Percent Paced 14.63 %   Brady Statistic RV Percent Paced 99.98 %   Brady Statistic AP VP Percent 14.63 %   Brady Statistic AS VP Percent 85.35 %   Brady Statistic AP VS Percent 0.01 %   Brady Statistic AS VS Percent 0.01 %      Assessment & Plan:   Problem List Items Addressed This Visit   None Visit Diagnoses     Breast lump on left side at 2 o'clock position    -  Primary   Will check diagnostic mammo and [place]. Await results. Call with any concerns.    Relevant Orders   MM DIAG BREAST TOMO UNI LEFT   US BREAST LTD UNI LEFT INC AXILLA        Follow up plan: No follow-ups on file.

## 2021-04-17 NOTE — Patient Instructions (Signed)
Please call to schedule your mammogram and/or bone density: °[facility]  °Address: [address], [place], [place] [address]  °Phone: [phone] ° °

## 2021-05-01 ENCOUNTER — Ambulatory Visit
Admission: RE | Admit: 2021-05-01 | Discharge: 2021-05-01 | Disposition: A | Discharge status: Home / Self Care | Payer: Medicare PPO | Source: Ambulatory Visit | Attending: Family Medicine | Admitting: Family Medicine

## 2021-05-01 ENCOUNTER — Other Ambulatory Visit: Payer: Self-pay

## 2021-05-01 DIAGNOSIS — N6321 Unspecified lump in the left breast, upper outer quadrant: Secondary | ICD-10-CM

## 2021-05-01 DIAGNOSIS — R922 Inconclusive mammogram: Secondary | ICD-10-CM | POA: Diagnosis not present

## 2021-05-01 IMAGING — MG MM DIGITAL DIAGNOSTIC UNILAT*L* W/ TOMO W/ CAD
8 series · 8 of 24 positions shown · non-contrast
Comparison: Previous exam(s).

CLINICAL DATA: Patient palpates a mass in the OUTER portion of the
LEFT breast. On physical exam, her provider palpates a second mass
in the UPPER central LEFT breast. Patient reports recent weight loss
of greater than 30 pounds.

EXAM:
DIGITAL DIAGNOSTIC UNILATERAL LEFT MAMMOGRAM WITH TOMOSYNTHESIS AND
CAD; ULTRASOUND LEFT BREAST LIMITED
TECHNIQUE: Left digital diagnostic mammography and breast tomosynthesis was
performed. The images were evaluated with computer-aided detection.;
Targeted ultrasound examination of the left breast was performed.

[L CC synth-2D (1 of 3)]
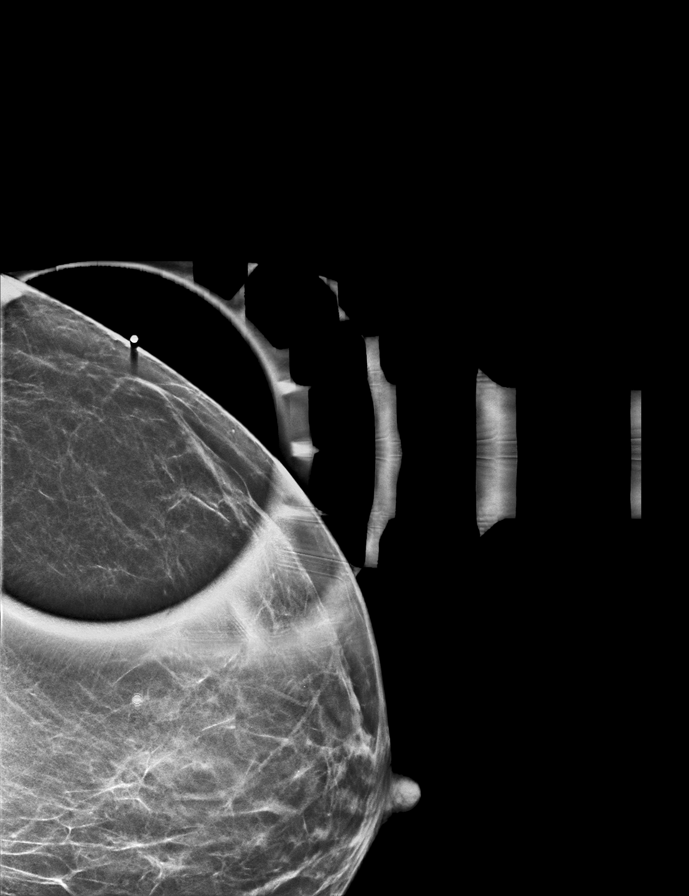

[L CC synth-2D (2 of 3)]
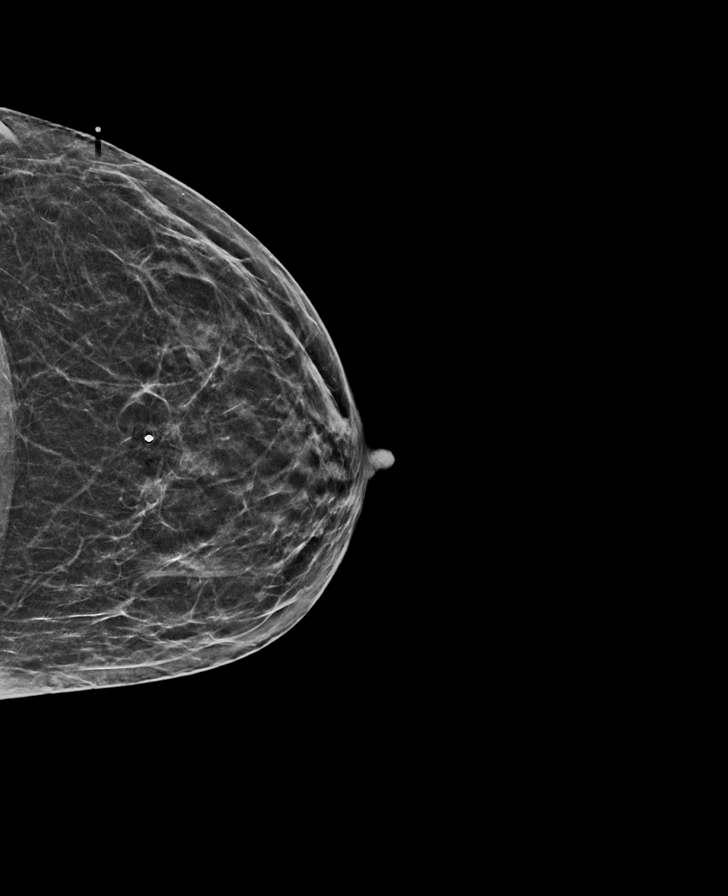

[L CC synth-2D (3 of 3)]
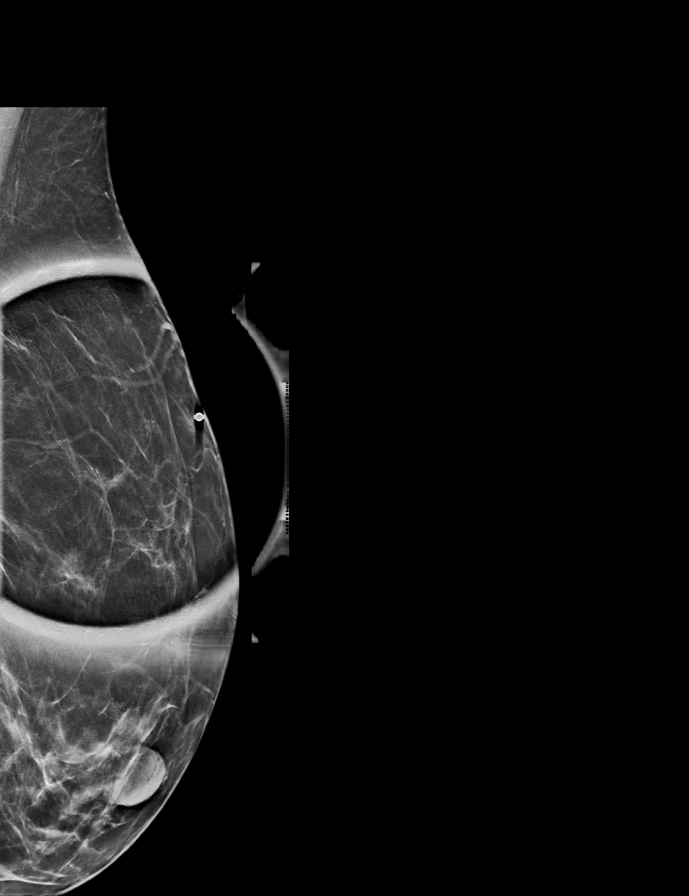

[L MLO synth-2D]
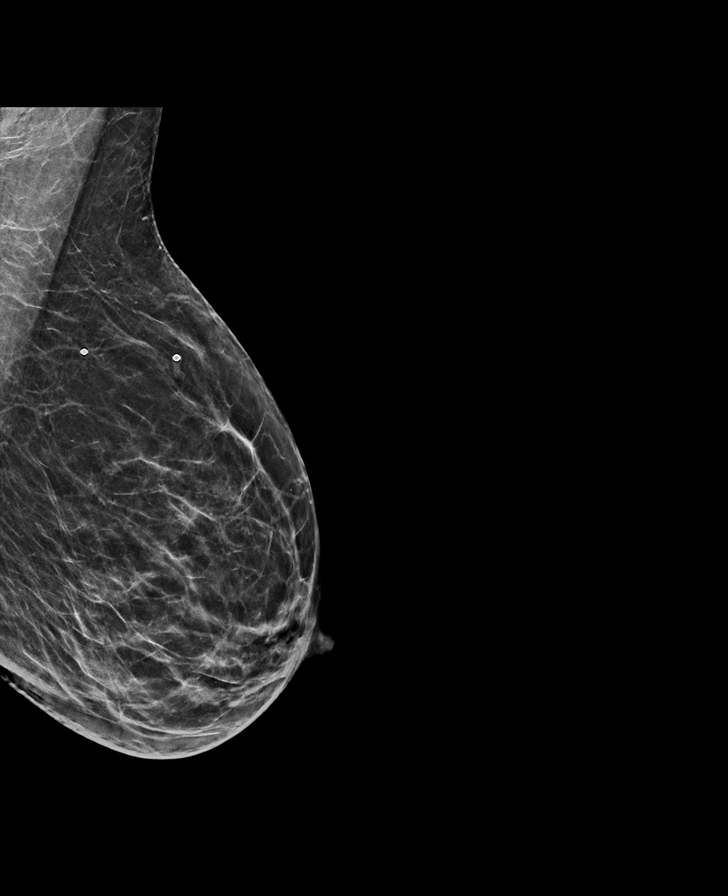

[L CC tomo (1 of 3) · tomo slice 24/47.0]
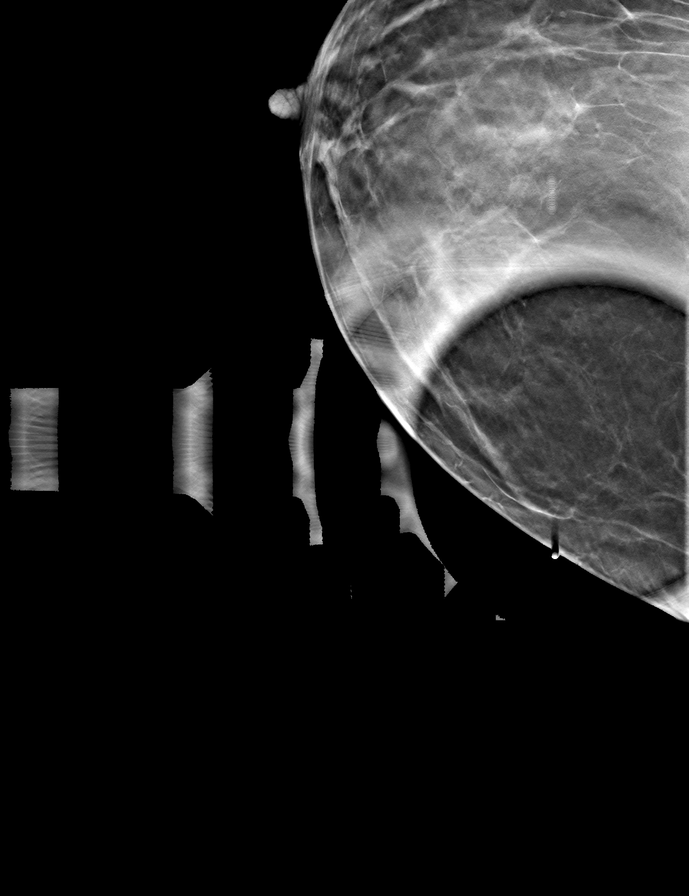

[L CC tomo (2 of 3) · tomo slice 27/52.0]
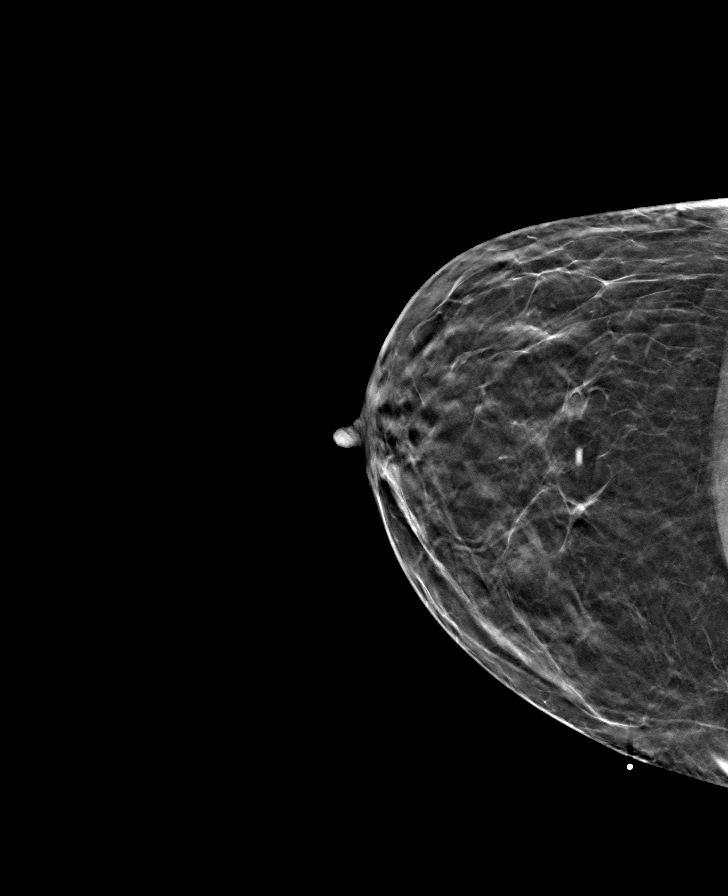

[L CC tomo (3 of 3) · tomo slice 29/56.0]
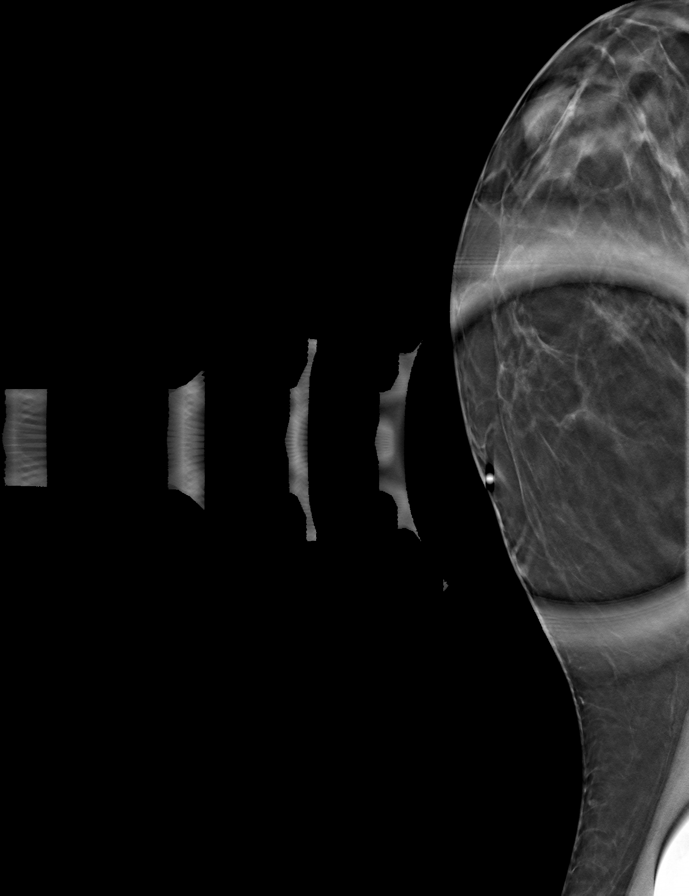

[L MLO tomo · tomo slice 29/58.0]
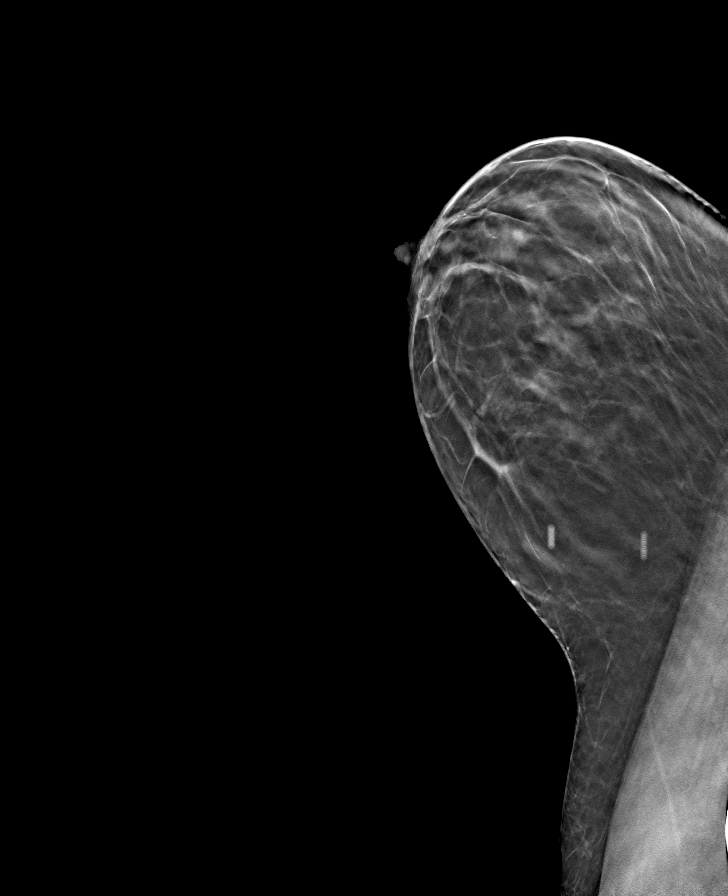

[8 of 24 positions shown; findings below may reference images not displayed]

ACR Breast Density Category b: There are scattered areas of
fibroglandular density.
FINDINGS: No suspicious mass, distortion, or microcalcifications are
identified to suggest presence of malignancy. Spot tangential view
of 2 areas of concern in the LEFT breast show normal appearing
fibrofatty tissue.

On physical exam, I palpate soft thickening without discrete mass in
the 1 o'clock location of the LEFT breast 9 centimeters from the
nipple. I palpate similar soft thickening without discrete mass in
the 11:30 o'clock location of the LEFT breast.

Targeted ultrasound is performed, showing normal appearing
fibrofatty tissue in the 11 30 and 1 o'clock locations of the LEFT
breast. No suspicious mass, distortion, or acoustic shadowing is
demonstrated with ultrasound.
IMPRESSION: No mammographic or ultrasound evidence for malignancy.

RECOMMENDATION:
Bilateral screening mammogram is recommended in [DATE].

I have discussed the findings and recommendations with the patient.
If applicable, a reminder letter will be sent to the patient
regarding the next appointment.

BI-RADS CATEGORY  1: Negative.

## 2021-05-01 IMAGING — US US BREAST*L* LIMITED INC AXILLA
1 series · 4 of 4 positions shown · non-contrast
Comparison: Previous exam(s).

CLINICAL DATA: Patient palpates a mass in the OUTER portion of the
LEFT breast. On physical exam, her provider palpates a second mass
in the UPPER central LEFT breast. Patient reports recent weight loss
of greater than 30 pounds.

EXAM:
DIGITAL DIAGNOSTIC UNILATERAL LEFT MAMMOGRAM WITH TOMOSYNTHESIS AND
CAD; ULTRASOUND LEFT BREAST LIMITED
TECHNIQUE: Left digital diagnostic mammography and breast tomosynthesis was
performed. The images were evaluated with computer-aided detection.;
Targeted ultrasound examination of the left breast was performed.

[Series 1: us breast*left* limited inc axilla · 0.07mm/px · 4 of 4 slices shown]
[im 1/4]
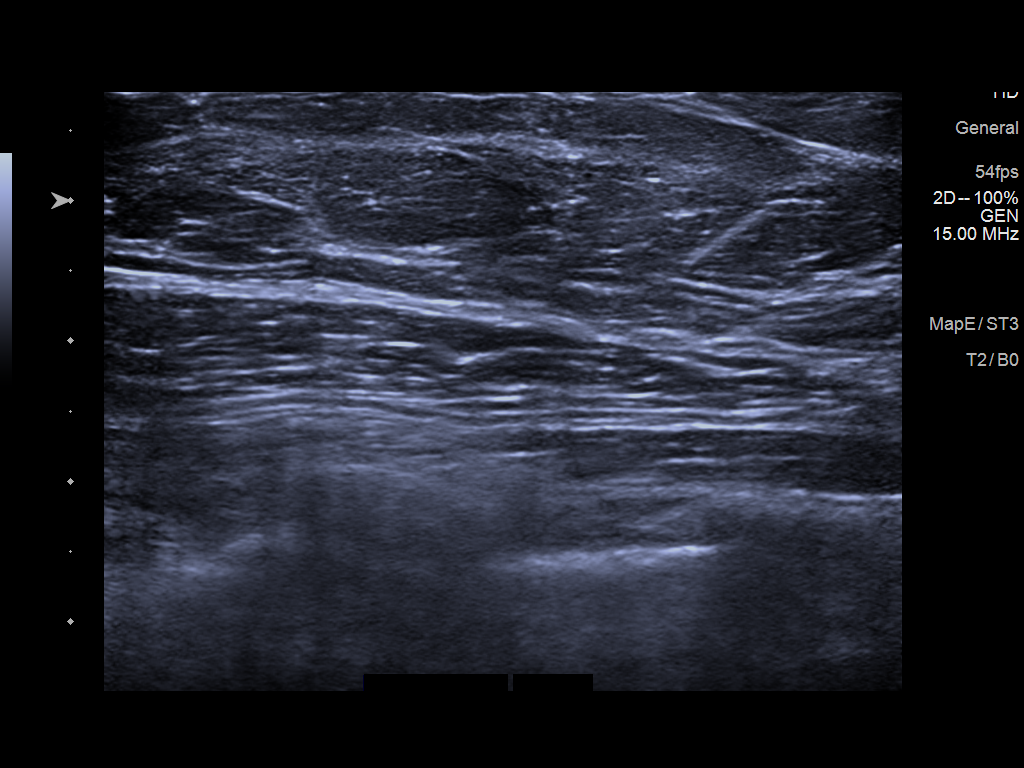
[im 2/4]
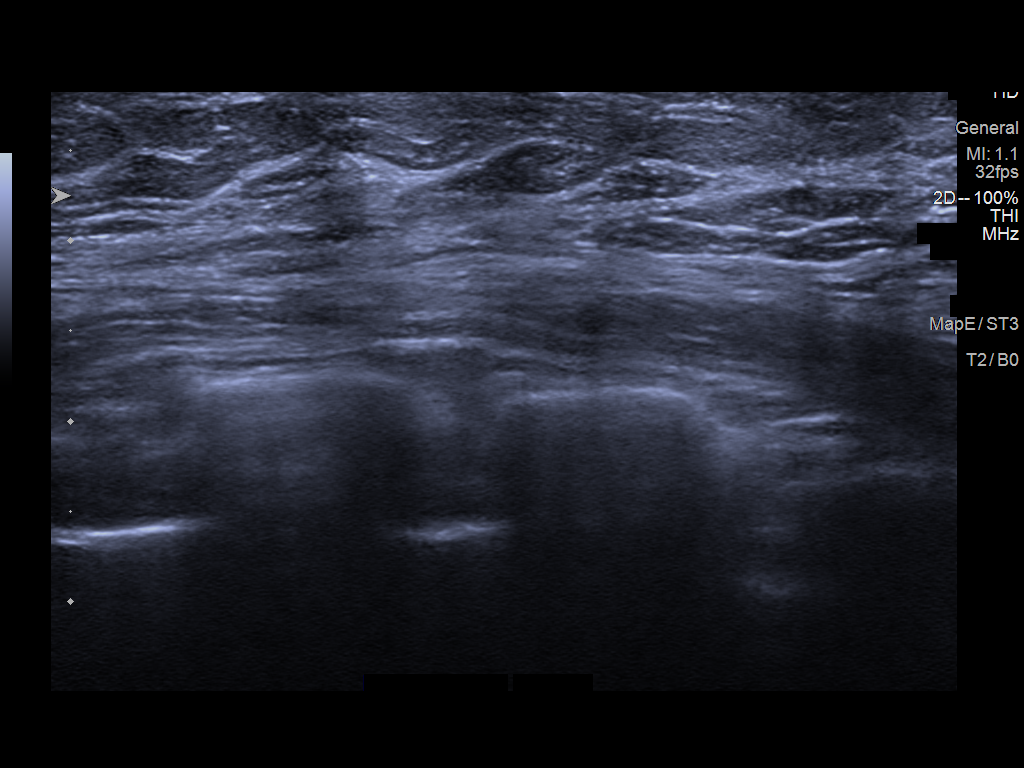
[im 3/4]
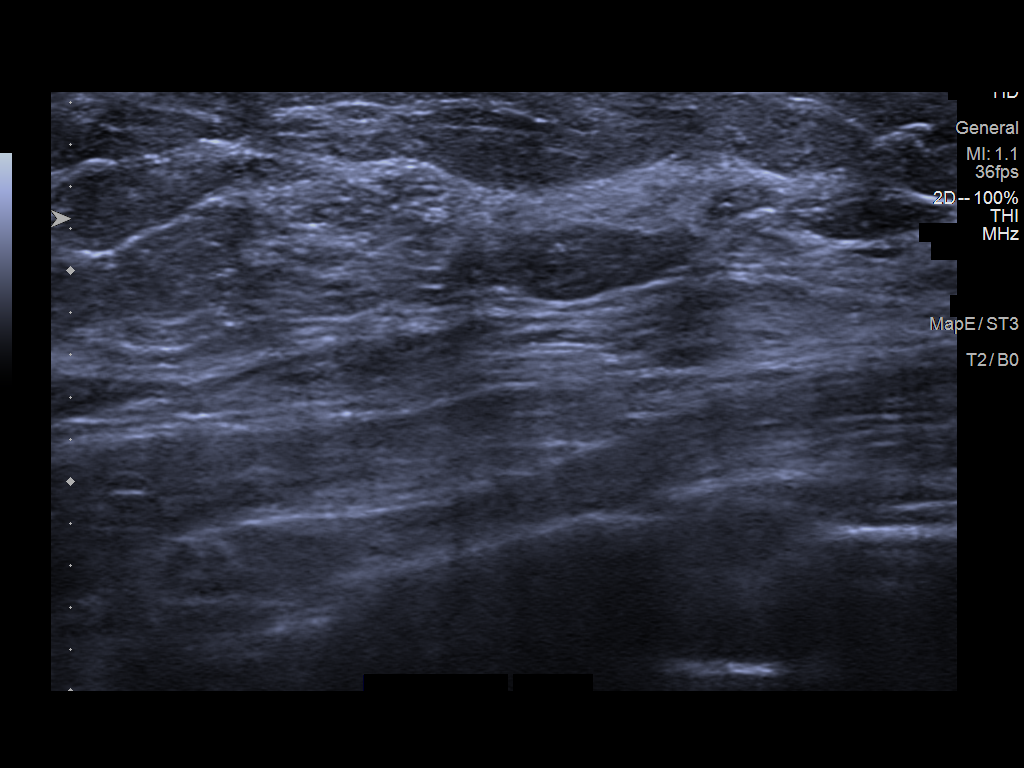
[im 4/4]
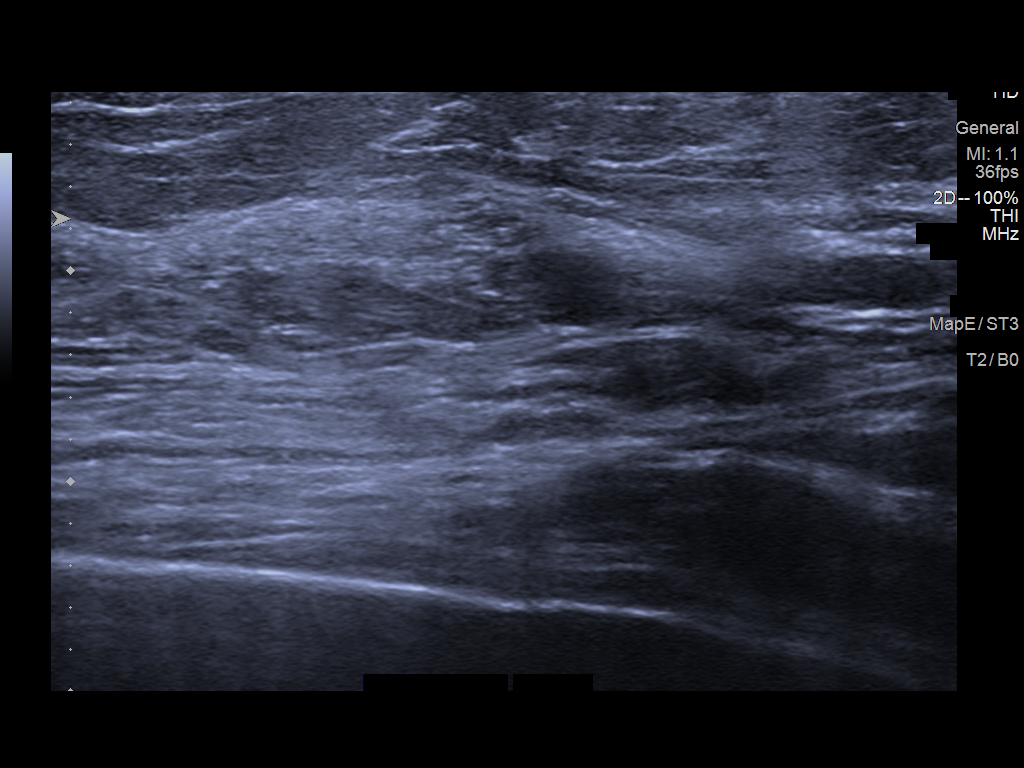

[4 of 4 positions shown; findings below may reference images not displayed]

ACR Breast Density Category b: There are scattered areas of
fibroglandular density.
FINDINGS: No suspicious mass, distortion, or microcalcifications are
identified to suggest presence of malignancy. Spot tangential view
of 2 areas of concern in the LEFT breast show normal appearing
fibrofatty tissue.

On physical exam, I palpate soft thickening without discrete mass in
the 1 o'clock location of the LEFT breast 9 centimeters from the
nipple. I palpate similar soft thickening without discrete mass in
the 11:30 o'clock location of the LEFT breast.

Targeted ultrasound is performed, showing normal appearing
fibrofatty tissue in the 11 30 and 1 o'clock locations of the LEFT
breast. No suspicious mass, distortion, or acoustic shadowing is
demonstrated with ultrasound.
IMPRESSION: No mammographic or ultrasound evidence for malignancy.

RECOMMENDATION:
Bilateral screening mammogram is recommended in [DATE].

I have discussed the findings and recommendations with the patient.
If applicable, a reminder letter will be sent to the patient
regarding the next appointment.

BI-RADS CATEGORY  1: Negative.

## 2021-05-03 DIAGNOSIS — M67441 Ganglion, right hand: Secondary | ICD-10-CM | POA: Diagnosis not present

## 2021-05-09 ENCOUNTER — Ambulatory Visit (INDEPENDENT_AMBULATORY_CARE_PROVIDER_SITE_OTHER): Payer: Medicare PPO

## 2021-05-09 DIAGNOSIS — I442 Atrioventricular block, complete: Secondary | ICD-10-CM

## 2021-05-09 LAB — CUP PACEART REMOTE DEVICE CHECK
Battery Remaining Longevity: 26 mo
Battery Voltage: 2.94 V
Brady Statistic AP VP Percent: 4.77 %
Brady Statistic AP VS Percent: 0 %
Brady Statistic AS VP Percent: 95.23 %
Brady Statistic AS VS Percent: 0 %
Brady Statistic RA Percent Paced: 4.77 %
Brady Statistic RV Percent Paced: 99.99 %
Date Time Interrogation Session: 20221207103652
Implantable Lead Implant Date: 20070319
Implantable Lead Implant Date: 20070319
Implantable Lead Location: 753859
Implantable Lead Location: 753860
Implantable Lead Model: 5076
Implantable Lead Model: 5076
Implantable Pulse Generator Implant Date: 20160210
Lead Channel Impedance Value: 285 Ohm
Lead Channel Impedance Value: 380 Ohm
Lead Channel Impedance Value: 437 Ohm
Lead Channel Impedance Value: 456 Ohm
Lead Channel Pacing Threshold Amplitude: 0.75 V
Lead Channel Pacing Threshold Amplitude: 0.875 V
Lead Channel Pacing Threshold Pulse Width: 0.4 ms
Lead Channel Pacing Threshold Pulse Width: 0.4 ms
Lead Channel Sensing Intrinsic Amplitude: 2.875 mV
Lead Channel Sensing Intrinsic Amplitude: 2.875 mV
Lead Channel Sensing Intrinsic Amplitude: 5.25 mV
Lead Channel Sensing Intrinsic Amplitude: 5.25 mV
Lead Channel Setting Pacing Amplitude: 2 V
Lead Channel Setting Pacing Amplitude: 2.5 V
Lead Channel Setting Pacing Pulse Width: 0.4 ms
Lead Channel Setting Sensing Sensitivity: 2 mV

## 2021-05-16 ENCOUNTER — Encounter: Payer: Self-pay | Admitting: Family Medicine

## 2021-05-16 ENCOUNTER — Other Ambulatory Visit: Payer: Self-pay | Admitting: Family Medicine

## 2021-05-16 DIAGNOSIS — F3289 Other specified depressive episodes: Secondary | ICD-10-CM

## 2021-05-16 NOTE — Telephone Encounter (Signed)
Requested medication (s) are due for refill today - yes   Requested medication (s) are on the active medication list -yes  Future visit scheduled -yes  Last refill: 07/10/20 #30  Notes to clinic: Request RF: non delegated Rx  Requested Prescriptions  Pending Prescriptions Disp Refills   LORazepam (ATIVAN) 0.5 MG tablet [Pharmacy Med Name: LORAZEPAM 0.5MG  TABLETS] 30 tablet     Sig: TAKE 1 TABLET(0.5 MG) BY MOUTH DAILY AS NEEDED FOR ANXIETY     Not Delegated - Psychiatry:  Anxiolytics/Hypnotics Failed - 05/16/2021 10:12 AM      Failed - This refill cannot be delegated      Failed - Urine Drug Screen completed in last 360 days      Passed - Valid encounter within last 6 months    Recent Outpatient Visits           4 weeks ago Breast lump on left side at 2 o'clock position   [facility], Megan P, DO   2 months ago Needs flu shot   [place], [place], DO   8 months ago Depression, recurrent ([place])   [place], Megan P, DO   9 months ago Depression, recurrent ([place])   [place], Megan P, DO   10 months ago Routine general medical examination at a health care facility   [place], [place], DO       Future Appointments             In 1 month [place], Megan P, DO [place], [facility]   In 2 months  [facility], [facility]               Requested Prescriptions  Pending Prescriptions Disp Refills   LORazepam (ATIVAN) 0.5 MG tablet [Pharmacy Med Name: LORAZEPAM 0.5MG  TABLETS] 30 tablet     Sig: TAKE 1 TABLET(0.5 MG) BY MOUTH DAILY AS NEEDED FOR ANXIETY     Not Delegated - Psychiatry:  Anxiolytics/Hypnotics Failed - 05/16/2021 10:12 AM      Failed - This refill cannot be delegated      Failed - Urine Drug Screen completed in last 360 days      Passed - Valid encounter within last 6 months    Recent Outpatient Visits           4 weeks ago  Breast lump on left side at 2 o'clock position   [facility], Megan P, DO   2 months ago Needs flu shot   [employer], [place], DO   8 months ago Depression, recurrent ([place])   [place], [place] P, DO   9 months ago Depression, recurrent ([place])   [place], [place], DO   10 months ago Routine general medical examination at a health care facility   [place], [place], DO       Future Appointments             In 1 month Johnson, Barb Merino, DO [place], [place]   In 2 months  [facility], [place]

## 2021-05-17 NOTE — Progress Notes (Signed)
Remote pacemaker transmission.   

## 2021-05-21 ENCOUNTER — Other Ambulatory Visit: Payer: Self-pay | Admitting: Family Medicine

## 2021-05-21 DIAGNOSIS — F3289 Other specified depressive episodes: Secondary | ICD-10-CM

## 2021-05-22 ENCOUNTER — Other Ambulatory Visit: Payer: Self-pay | Admitting: Family Medicine

## 2021-05-22 DIAGNOSIS — F3289 Other specified depressive episodes: Secondary | ICD-10-CM

## 2021-05-22 MED ORDER — LORAZEPAM 0.5 MG PO TABS: ORAL_TABLET | ORAL | 0 refills | Status: DC

## 2021-05-23 ENCOUNTER — Telehealth: Payer: Self-pay

## 2021-05-23 NOTE — Telephone Encounter (Signed)
PA initiated via CoverMyMeds for Lorazepam 0.5MG  tab Key: [id number] Waiting on response.

## 2021-05-25 NOTE — Telephone Encounter (Signed)
PA has been approved

## 2021-05-31 ENCOUNTER — Ambulatory Visit: Payer: Medicare PPO | Admitting: Internal Medicine

## 2021-06-19 DIAGNOSIS — M159 Polyosteoarthritis, unspecified: Secondary | ICD-10-CM | POA: Diagnosis not present

## 2021-06-19 DIAGNOSIS — M47812 Spondylosis without myelopathy or radiculopathy, cervical region: Secondary | ICD-10-CM | POA: Diagnosis not present

## 2021-06-27 DIAGNOSIS — M5412 Radiculopathy, cervical region: Secondary | ICD-10-CM | POA: Diagnosis not present

## 2021-06-28 ENCOUNTER — Other Ambulatory Visit (HOSPITAL_COMMUNITY): Payer: Self-pay | Admitting: Neurological Surgery

## 2021-06-28 DIAGNOSIS — M5412 Radiculopathy, cervical region: Secondary | ICD-10-CM

## 2021-07-11 ENCOUNTER — Other Ambulatory Visit: Payer: Self-pay

## 2021-07-11 ENCOUNTER — Ambulatory Visit (INDEPENDENT_AMBULATORY_CARE_PROVIDER_SITE_OTHER): Payer: Medicare PPO | Admitting: Family Medicine

## 2021-07-11 ENCOUNTER — Encounter: Payer: Self-pay | Admitting: Family Medicine

## 2021-07-11 VITALS — BP 116/81 | HR 86 | Temp 98.0°F | Ht 64.49 in | Wt 147.2 lb

## 2021-07-11 DIAGNOSIS — F429 Obsessive-compulsive disorder, unspecified: Secondary | ICD-10-CM | POA: Diagnosis not present

## 2021-07-11 DIAGNOSIS — Z0000 Encounter for general adult medical examination without abnormal findings: Secondary | ICD-10-CM | POA: Diagnosis not present

## 2021-07-11 DIAGNOSIS — I4891 Unspecified atrial fibrillation: Secondary | ICD-10-CM | POA: Diagnosis not present

## 2021-07-11 DIAGNOSIS — S51802A Unspecified open wound of left forearm, initial encounter: Secondary | ICD-10-CM | POA: Diagnosis not present

## 2021-07-11 DIAGNOSIS — F3342 Major depressive disorder, recurrent, in full remission: Secondary | ICD-10-CM

## 2021-07-11 DIAGNOSIS — I4719 Other supraventricular tachycardia: Secondary | ICD-10-CM

## 2021-07-11 DIAGNOSIS — I10 Essential (primary) hypertension: Secondary | ICD-10-CM | POA: Diagnosis not present

## 2021-07-11 DIAGNOSIS — Z1231 Encounter for screening mammogram for malignant neoplasm of breast: Secondary | ICD-10-CM

## 2021-07-11 DIAGNOSIS — G901 Familial dysautonomia [Riley-Day]: Secondary | ICD-10-CM | POA: Diagnosis not present

## 2021-07-11 DIAGNOSIS — Z23 Encounter for immunization: Secondary | ICD-10-CM

## 2021-07-11 DIAGNOSIS — M47812 Spondylosis without myelopathy or radiculopathy, cervical region: Secondary | ICD-10-CM | POA: Diagnosis not present

## 2021-07-11 DIAGNOSIS — Z136 Encounter for screening for cardiovascular disorders: Secondary | ICD-10-CM

## 2021-07-11 DIAGNOSIS — R61 Generalized hyperhidrosis: Secondary | ICD-10-CM

## 2021-07-11 DIAGNOSIS — I471 Supraventricular tachycardia: Secondary | ICD-10-CM | POA: Diagnosis not present

## 2021-07-11 DIAGNOSIS — F339 Major depressive disorder, recurrent, unspecified: Secondary | ICD-10-CM

## 2021-07-11 DIAGNOSIS — F3289 Other specified depressive episodes: Secondary | ICD-10-CM

## 2021-07-11 LAB — URINALYSIS, ROUTINE W REFLEX MICROSCOPIC
Bilirubin, UA: NEGATIVE
Glucose, UA: NEGATIVE
Ketones, UA: NEGATIVE
Leukocytes,UA: NEGATIVE
Nitrite, UA: NEGATIVE
Protein,UA: NEGATIVE
RBC, UA: NEGATIVE
Specific Gravity, UA: 1.02 (ref 1.005–1.030)
Urobilinogen, Ur: 0.2 mg/dL (ref 0.2–1.0)
pH, UA: 8.5 — ABNORMAL HIGH (ref 5.0–7.5)

## 2021-07-11 LAB — MICROALBUMIN, URINE WAIVED
Creatinine, Urine Waived: 100 mg/dL (ref 10–300)
Microalb, Ur Waived: 10 mg/L (ref 0–19)
Microalb/Creat Ratio: <30 mg/g (ref <30)

## 2021-07-11 MED ORDER — VENLAFAXINE HCL ER 75 MG PO CP24: ORAL_CAPSULE | ORAL | 1 refills | Status: DC

## 2021-07-11 MED ORDER — VENLAFAXINE HCL ER 150 MG PO CP24: 150.0000 mg | ORAL_CAPSULE | Freq: Every day | ORAL | 1 refills | Status: DC

## 2021-07-11 MED ORDER — LORAZEPAM 0.5 MG PO TABS: ORAL_TABLET | ORAL | 0 refills | Status: DC

## 2021-07-11 MED ORDER — PREDNISONE 10 MG PO TABS: ORAL_TABLET | ORAL | 0 refills | Status: DC

## 2021-07-11 NOTE — Progress Notes (Signed)
BP 116/81    Pulse 86    Temp 98 F (36.7 C) (Oral)    Ht 5' 4.49" (1.638 m)    Wt 147 lb 3.2 oz (66.8 kg)    SpO2 99%    BMI 24.89 kg/m    Subjective:    Patient ID: Angela [name], female    DOB: Nov 06, 1970, 51 y.o.   MRN: [id number]  HPI: Angela [name] is a 51 y.o. female presenting on 07/11/2021 for comprehensive medical examination. Current medical complaints include:  Neck has been acting up a lot. Working with ortho, but can't get in for a shot for a few weeks. She is traveling to [place] later this month and is worried that the pain is going to be significantly worse with the travel  HYPERTENSION Hypertension status: stable  Satisfied with current treatment? yes Duration of hypertension: chronic BP monitoring frequency:  a few times a week BP medication side effects:  no Medication compliance: excellent compliance Previous BP meds:none right now Aspirin: no Recurrent headaches: no Visual changes: no Palpitations: no Dyspnea: no Chest pain: no Lower extremity edema: no Dizzy/lightheaded: yes  DEPRESSION Mood status: stable Satisfied with current treatment?: yes Symptom severity: mild  Duration of current treatment : chronic Side effects: no Medication compliance: excellent compliance Psychotherapy/counseling: no  Previous psychiatric medications: effexor Depressed mood: no Anxious mood: no Anhedonia: no Significant weight loss or gain: no Insomnia: no  Fatigue: yes Feelings of worthlessness or guilt: no Impaired concentration/indecisiveness: no Suicidal ideations: no Hopelessness: no Crying spells: no Depression screen [facility] 2/9 07/11/2021 04/17/2021 03/08/2021 09/06/2020 08/08/2020  Decreased Interest 1 0 0 1 1  Down, Depressed, Hopeless 1 0 0 1 2  PHQ - 2 Score 2 0 0 2 3  Altered sleeping '3 2 1 2 2  ' Tired, decreased energy '2 1 3 1 2  ' Change in appetite 0 0 0 0 2  Feeling bad or failure about yourself  0 0 - 0 0  Trouble concentrating 2 2 0 2 2   Moving slowly or fidgety/restless 0 0 0 0 0  Suicidal thoughts 0 0 0 0 0  PHQ-9 Score '9 5 4 7 11  ' Difficult doing work/chores Not difficult at all - Somewhat difficult Somewhat difficult Somewhat difficult  Some recent data might be hidden    She currently lives with: husband Menopausal Symptoms: no  Depression Screen done today and results listed below:  Depression screen [facility] 2/9 07/11/2021 04/17/2021 03/08/2021 09/06/2020 08/08/2020  Decreased Interest 1 0 0 1 1  Down, Depressed, Hopeless 1 0 0 1 2  PHQ - 2 Score 2 0 0 2 3  Altered sleeping '3 2 1 2 2  ' Tired, decreased energy '2 1 3 1 2  ' Change in appetite 0 0 0 0 2  Feeling bad or failure about yourself  0 0 - 0 0  Trouble concentrating 2 2 0 2 2  Moving slowly or fidgety/restless 0 0 0 0 0  Suicidal thoughts 0 0 0 0 0  PHQ-9 Score '9 5 4 7 11  ' Difficult doing work/chores Not difficult at all - Somewhat difficult Somewhat difficult Somewhat difficult  Some recent data might be hidden    Past Medical History:  Past Medical History:  Diagnosis Date   Arthritis 2016   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 EF 50-55% normal coronaries   Complete heart block (HCC)    Depression  Dysautonomia ([place])    Dyspnea    CT negative for PE, 2007   Hypertension    Insomnia    OCD (obsessive compulsive disorder)    SVT (supraventricular tachycardia) (HCC)    s/p ablation a. c/b AV nod ablation requiring pacemaker    Surgical History:  Past Surgical History:  Procedure Laterality Date   BREAST BIOPSY Right 1998   BREAST BIOPSY Right 2004   BREAST LUMPECTOMY     RIGHT -BREAST    COLONOSCOPY WITH PROPOFOL N/A 09/07/2019   Procedure: COLONOSCOPY WITH PROPOFOL;  Surgeon: Lucilla Lame, MD;  Location: [facility] ENDOSCOPY;  Service: Endoscopy;  Laterality: N/A;   ENDOMETRIAL ABLATION  2005   INSERT / REPLACE / REMOVE PACEMAKER     PELVIC LAPAROSCOPY  1990   PERMANENT PACEMAKER GENERATOR CHANGE N/A 07/13/2014   MDT MRI  compatible dual chamber pacemaker implanted by Dr Jeralene Peters  2005   TUBAL LIGATION  2003    Medications:  Current Outpatient Medications on File Prior to Visit  Medication Sig   celecoxib (CELEBREX) 100 MG capsule Take by mouth.   Cyanocobalamin (VITAMIN B 12) 500 MCG TABS Take 1,000 mcg by mouth daily.   cyclobenzaprine (FLEXERIL) 5 MG tablet Take 5 mg by mouth as needed.   methocarbamol (ROBAXIN) 500 MG tablet methocarbamol 500 mg tablet   Multiple Vitamins-Minerals (MULTIVITAMINS THER. W/MINERALS) TABS Take 1 tablet by mouth daily.   traMADol (ULTRAM) 50 MG tablet Take 50 mg by mouth as needed.   No current facility-administered medications on file prior to visit.    Allergies:  Allergies  Allergen Reactions   Toprol Xl [Metoprolol Succinate] Photosensitivity    Social History:  Social History   Socioeconomic History   Marital status: Married    Spouse name: Not on file   Number of children: Not on file   Years of education: Not on file   Highest education level: Bachelor's degree (e.g., BA, AB, BS)  Occupational History   Occupation: disability   Tobacco Use   Smoking status: Never   Smokeless tobacco: Never  Vaping Use   Vaping Use: Never used  Substance and Sexual Activity   Alcohol use: No   Drug use: No   Sexual activity: Yes    Birth control/protection: Surgical    Comment: Uterine ablarion 2002  Other Topics Concern   Not on file  Social History Narrative   Work with daughters school    Social Determinants of Health   Financial Resource Strain: Low Risk    Difficulty of Paying Living Expenses: Not hard at all  Food Insecurity: No Food Insecurity   Worried About Charity fundraiser in the Last Year: Never true   Arboriculturist in the Last Year: Never true  Transportation Needs: No Transportation Needs   Lack of Transportation (Medical): No   Lack of Transportation (Non-Medical): No  Physical Activity: Sufficiently Active   Days of Exercise  per Week: 5 days   Minutes of Exercise per Session: 30 min  Stress: No Stress Concern Present   Feeling of Stress : Not at all  Social Connections: Not on file  Intimate Partner Violence: Not on file   Social History   Tobacco Use  Smoking Status Never  Smokeless Tobacco Never   Social History   Substance and Sexual Activity  Alcohol Use No    Family History:  Family History  Problem Relation Age of Onset   Hypertension Father  Cancer Father 35       COLON   Diabetes Father    Breast cancer Paternal Aunt    Cancer Paternal Aunt    Hypertension Maternal Grandfather    Diabetes Paternal Grandmother    Breast cancer Paternal Grandmother    Arthritis Paternal Grandmother    Cancer Paternal Grandmother    Diabetes Paternal Grandfather    Cancer Paternal Grandfather    Breast cancer Maternal Aunt        mastectomy   Cancer Maternal Aunt    Anxiety disorder Mother    Arthritis Mother    Obesity Mother    Dementia Maternal Grandmother    Breast cancer Paternal Aunt    Anxiety disorder Sister     Past medical history, surgical history, medications, allergies, family history and social history reviewed with patient today and changes made to appropriate areas of the chart.   Review of Systems  Constitutional:  Positive for malaise/fatigue. Negative for chills, diaphoresis, fever and weight loss.  HENT: Negative.    Eyes: Negative.   Respiratory:  Positive for shortness of breath. Negative for cough, hemoptysis, sputum production and wheezing.   Cardiovascular:  Positive for palpitations. Negative for chest pain, orthopnea, claudication, leg swelling and PND.  Gastrointestinal: Negative.   Genitourinary: Negative.   Musculoskeletal:  Positive for neck pain. Negative for back pain, falls, joint pain and myalgias.  Skin: Negative.   Neurological:  Positive for dizziness. Negative for tingling, tremors, sensory change, speech change, focal weakness, seizures, loss of  consciousness, weakness and headaches.  Endo/Heme/Allergies:  Negative for environmental allergies and polydipsia. Bruises/bleeds easily.  Psychiatric/Behavioral:  Positive for depression. Negative for hallucinations, memory loss, substance abuse and suicidal ideas. The patient is nervous/anxious. The patient does not have insomnia.   All other ROS negative except what is listed above and in the HPI.      Objective:    BP 116/81    Pulse 86    Temp 98 F (36.7 C) (Oral)    Ht 5' 4.49" (1.638 m)    Wt 147 lb 3.2 oz (66.8 kg)    SpO2 99%    BMI 24.89 kg/m   Wt Readings from Last 3 Encounters:  07/11/21 147 lb 3.2 oz (66.8 kg)  04/17/21 158 lb (71.7 kg)  03/08/21 157 lb 6.4 oz (71.4 kg)    Physical Exam Vitals and nursing note reviewed.  Constitutional:      General: She is not in acute distress.    Appearance: Normal appearance. She is not ill-appearing, toxic-appearing or diaphoretic.  HENT:     Head: Normocephalic and atraumatic.     Right Ear: Tympanic membrane, ear canal and external ear normal. There is no impacted cerumen.     Left Ear: Tympanic membrane, ear canal and external ear normal. There is no impacted cerumen.     Nose: Nose normal. No congestion or rhinorrhea.     Mouth/Throat:     Mouth: Mucous membranes are moist.     Pharynx: Oropharynx is clear. No oropharyngeal exudate or posterior oropharyngeal erythema.  Eyes:     General: No scleral icterus.       Right eye: No discharge.        Left eye: No discharge.     Extraocular Movements: Extraocular movements intact.     Conjunctiva/sclera: Conjunctivae normal.     Pupils: Pupils are equal, round, and reactive to light.  Neck:     Vascular: No carotid bruit.  Cardiovascular:  Rate and Rhythm: Normal rate and regular rhythm.     Pulses: Normal pulses.     Heart sounds: No murmur heard.   No friction rub. No gallop.  Pulmonary:     Effort: Pulmonary effort is normal. No respiratory distress.     Breath  sounds: Normal breath sounds. No stridor. No wheezing, rhonchi or rales.  Chest:     Chest wall: No tenderness.  Abdominal:     General: Abdomen is flat. Bowel sounds are normal. There is no distension.     Palpations: Abdomen is soft. There is no mass.     Tenderness: There is no abdominal tenderness. There is no right CVA tenderness, left CVA tenderness, guarding or rebound.     Hernia: No hernia is present.  Genitourinary:    Comments: Breast and pelvic exams deferred with shared decision making Musculoskeletal:        General: No swelling, tenderness, deformity or signs of injury.     Cervical back: Normal range of motion and neck supple. No rigidity. No muscular tenderness.     Right lower leg: No edema.     Left lower leg: No edema.  Lymphadenopathy:     Cervical: No cervical adenopathy.  Skin:    General: Skin is warm and dry.     Capillary Refill: Capillary refill takes less than 2 seconds.     Coloration: Skin is not jaundiced or pale.     Findings: No bruising, erythema, lesion or rash.  Neurological:     General: No focal deficit present.     Mental Status: She is alert and oriented to person, place, and time. Mental status is at baseline.     Cranial Nerves: No cranial nerve deficit.     Sensory: No sensory deficit.     Motor: No weakness.     Coordination: Coordination normal.     Gait: Gait normal.     Deep Tendon Reflexes: Reflexes normal.  Psychiatric:        Mood and Affect: Mood normal.        Behavior: Behavior normal.        Thought Content: Thought content normal.        Judgment: Judgment normal.    Results for orders placed or performed in visit on 07/11/21  CBC with Differential/Platelet  Result Value Ref Range   WBC 5.8 3.4 - 10.8 x10E3/uL   RBC 4.57 3.77 - 5.28 x10E6/uL   Hemoglobin 14.5 11.1 - 15.9 g/dL   Hematocrit 43.5 34.0 - 46.6 %   MCV 95 79 - 97 fL   MCH 31.7 26.6 - 33.0 pg   MCHC 33.3 31.5 - 35.7 g/dL   RDW 11.7 11.7 - 15.4 %    Platelets 266 150 - 450 x10E3/uL   Neutrophils 62 Not Estab. %   Lymphs 30 Not Estab. %   Monocytes 7 Not Estab. %   Eos 0 Not Estab. %   Basos 1 Not Estab. %   Neutrophils Absolute 3.7 1.4 - 7.0 x10E3/uL   Lymphocytes Absolute 1.7 0.7 - 3.1 x10E3/uL   Monocytes Absolute 0.4 0.1 - 0.9 x10E3/uL   EOS (ABSOLUTE) 0.0 0.0 - 0.4 x10E3/uL   Basophils Absolute 0.0 0.0 - 0.2 x10E3/uL   Immature Granulocytes 0 Not Estab. %   Immature Grans (Abs) 0.0 0.0 - 0.1 x10E3/uL  Comprehensive metabolic panel  Result Value Ref Range   Glucose 83 70 - 99 mg/dL   BUN 12 6 - 24  mg/dL   Creatinine, Ser 0.80 0.57 - 1.00 mg/dL   eGFR 90 >59 mL/min/1.73   BUN/Creatinine Ratio 15 9 - 23   Sodium 141 134 - 144 mmol/L   Potassium 4.6 3.5 - 5.2 mmol/L   Chloride 103 96 - 106 mmol/L   CO2 26 20 - 29 mmol/L   Calcium 9.5 8.7 - 10.2 mg/dL   Total Protein 6.1 6.0 - 8.5 g/dL   Albumin 4.3 3.8 - 4.8 g/dL   Globulin, Total 1.8 1.5 - 4.5 g/dL   Albumin/Globulin Ratio 2.4 (H) 1.2 - 2.2   Bilirubin Total 0.4 0.0 - 1.2 mg/dL   Alkaline Phosphatase 82 44 - 121 IU/L   AST 27 0 - 40 IU/L   ALT 23 0 - 32 IU/L  Lipid Panel w/o Chol/HDL Ratio  Result Value Ref Range   Cholesterol, Total 165 100 - 199 mg/dL   Triglycerides 72 0 - 149 mg/dL   HDL 69 >39 mg/dL   VLDL Cholesterol Cal 14 5 - 40 mg/dL   LDL Chol Calc (NIH) 82 0 - 99 mg/dL  Urinalysis, Routine w reflex microscopic  Result Value Ref Range   Specific Gravity, UA 1.020 1.005 - 1.030   pH, UA 8.5 (H) 5.0 - 7.5   Color, UA Yellow Yellow   Appearance Ur Clear Clear   Leukocytes,UA Negative Negative   Protein,UA Negative Negative/Trace   Glucose, UA Negative Negative   Ketones, UA Negative Negative   RBC, UA Negative Negative   Bilirubin, UA Negative Negative   Urobilinogen, Ur 0.2 0.2 - 1.0 mg/dL   Nitrite, UA Negative Negative  TSH  Result Value Ref Range   TSH 0.956 0.450 - 4.500 uIU/mL  Microalbumin, Urine Waived  Result Value Ref Range   Microalb,  Ur Waived 10 0 - 19 mg/L   Creatinine, Urine Waived 100 10 - 300 mg/dL   Microalb/Creat Ratio <30 <30 mg/g  FSH  Result Value Ref Range   FSH 5.5 mIU/mL  LH  Result Value Ref Range   LH 8.0 mIU/mL  Estradiol  Result Value Ref Range   Estradiol 545.0 pg/mL      Assessment & Plan:   Problem List Items Addressed This Visit       Cardiovascular and Mediastinum   Atrial tachycardia/SINUS tachycardia    NSR today. Continue to follow with cardiology. Call with any concerns. Labs drawn today.      Atrial fibrillation (HCC)    NSR today. Continue to follow with cardiology. Call with any concerns. Labs drawn today.      Relevant Orders   CBC with Differential/Platelet (Completed)   Comprehensive metabolic panel (Completed)   Hypertension    Under good control on current regimen. Continue current regimen. Continue to monitor. Call with any concerns. Labs drawn today.       Relevant Orders   CBC with Differential/Platelet (Completed)   Comprehensive metabolic panel (Completed)   Urinalysis, Routine w reflex microscopic (Completed)   Microalbumin, Urine Waived (Completed)     Nervous and Auditory   Dysautonomia (HCC)    Stable. Continue to monitor. Continue to follow with cardiology. Call with any concerns.       Relevant Orders   CBC with Differential/Platelet (Completed)   Comprehensive metabolic panel (Completed)   TSH (Completed)     Musculoskeletal and Integument   Cervical spondylosis    In exacerbation. Will treat with steroid taper. Follow up with ortho as needed. Call with any concerns.  Relevant Medications   celecoxib (CELEBREX) 100 MG capsule   predniSONE (DELTASONE) 10 MG tablet     Other   Depression, recurrent ([place])    Under good control on current regimen. Continue current regimen. Continue to monitor. Call with any concerns. Refills given. Labs drawn today.       Relevant Medications   LORazepam (ATIVAN) 0.5 MG tablet   venlafaxine XR  (EFFEXOR XR) 75 MG 24 hr capsule   venlafaxine XR (EFFEXOR-XR) 150 MG 24 hr capsule   Other Relevant Orders   CBC with Differential/Platelet (Completed)   Comprehensive metabolic panel (Completed)   TSH (Completed)   OCD (obsessive compulsive disorder)    Under good control on current regimen. Continue current regimen. Continue to monitor. Call with any concerns. Refills given. Labs drawn today.       Relevant Medications   LORazepam (ATIVAN) 0.5 MG tablet   venlafaxine XR (EFFEXOR XR) 75 MG 24 hr capsule   venlafaxine XR (EFFEXOR-XR) 150 MG 24 hr capsule   Other Visit Diagnoses     Routine general medical examination at a health care facility    -  Primary   Vaccines up to date. Screening labs checked today. Pap N/A. Mammo ordered. Colonoscopy up to date. Continue diet and exercise. Call with any concerns.    Night sweats       Will check labs. Await results. Treat as needed.    Relevant Orders   FSH (Completed)   LH (Completed)   Estradiol (Completed)   Open wound of left forearm, initial encounter       Healing well. Due for Td. Given today.   Relevant Orders   Td : Tetanus/diphtheria >7yo Preservative  free (Completed)   Encounter for screening mammogram for malignant neoplasm of breast       Mammogram ordered today.   Relevant Orders   MM 3D SCREEN BREAST BILATERAL   Screening for cardiovascular condition       Labs drawn today. await results. [place] as needed.    Relevant Orders   Lipid Panel w/o Chol/HDL Ratio (Completed)   Recurrent major depressive disorder, in full remission (HCC)       Relevant Medications   LORazepam (ATIVAN) 0.5 MG tablet   venlafaxine XR (EFFEXOR XR) 75 MG 24 hr capsule   venlafaxine XR (EFFEXOR-XR) 150 MG 24 hr capsule   Other depression       Relevant Medications   LORazepam (ATIVAN) 0.5 MG tablet   venlafaxine XR (EFFEXOR XR) 75 MG 24 hr capsule   venlafaxine XR (EFFEXOR-XR) 150 MG 24 hr capsule        Follow up plan: Return in  about 6 months (around 01/08/2022).   LABORATORY TESTING:  - Pap smear: not applicable  IMMUNIZATIONS:   - Tdap: Tetanus vaccination status reviewed: Tdap vaccination indicated and given today. - Influenza: Up to date - Pneumovax: Up to date - Prevnar: Not applicable - COVID: Up to date - HPV: Not applicable - Shingrix vaccine:  Will get at the pharmacy  SCREENING: -Mammogram: Ordered today  - Colonoscopy: Up to date   PATIENT COUNSELING:   Advised to take 1 mg of folate supplement per day if capable of pregnancy.   Sexuality: Discussed sexually transmitted diseases, partner selection, use of condoms, avoidance of unintended pregnancy  and contraceptive alternatives.   Advised to avoid cigarette smoking.  I discussed with the patient that most people either abstain from alcohol or drink within safe limits (<=14/week and <=  4 drinks/occasion for males, <=7/weeks and <= 3 drinks/occasion for females) and that the risk for alcohol disorders and other health effects rises proportionally with the number of drinks per week and how often a drinker exceeds daily limits.  Discussed cessation/primary prevention of drug use and availability of treatment for abuse.   Diet: Encouraged to adjust caloric intake to maintain  or achieve ideal body weight, to reduce intake of dietary saturated fat and total fat, to limit sodium intake by avoiding high sodium foods and not adding table salt, and to maintain adequate dietary potassium and calcium preferably from fresh fruits, vegetables, and low-fat dairy products.    stressed the importance of regular exercise  Injury prevention: Discussed safety belts, safety helmets, smoke detector, smoking near bedding or upholstery.   Dental health: Discussed importance of regular tooth brushing, flossing, and dental visits.    NEXT PREVENTATIVE PHYSICAL DUE IN 1 YEAR. Return in about 6 months (around 01/08/2022).

## 2021-07-11 NOTE — Patient Instructions (Signed)
Please call to schedule your mammogram: °[facility]  °Address: [address], [place], [place] [address]  °Phone: [phone] ° °

## 2021-07-12 LAB — COMPREHENSIVE METABOLIC PANEL
ALT: 23 IU/L (ref 0–32)
AST: 27 IU/L (ref 0–40)
Albumin/Globulin Ratio: 2.4 — ABNORMAL HIGH (ref 1.2–2.2)
Albumin: 4.3 g/dL (ref 3.8–4.8)
Alkaline Phosphatase: 82 IU/L (ref 44–121)
BUN/Creatinine Ratio: 15 (ref 9–23)
BUN: 12 mg/dL (ref 6–24)
Bilirubin Total: 0.4 mg/dL (ref 0.0–1.2)
CO2: 26 mmol/L (ref 20–29)
Calcium: 9.5 mg/dL (ref 8.7–10.2)
Chloride: 103 mmol/L (ref 96–106)
Creatinine, Ser: 0.8 mg/dL (ref 0.57–1.00)
Globulin, Total: 1.8 g/dL (ref 1.5–4.5)
Glucose: 83 mg/dL (ref 70–99)
Potassium: 4.6 mmol/L (ref 3.5–5.2)
Sodium: 141 mmol/L (ref 134–144)
Total Protein: 6.1 g/dL (ref 6.0–8.5)
eGFR: 90 mL/min/{1.73_m2} (ref >59)

## 2021-07-12 LAB — CBC WITH DIFFERENTIAL/PLATELET
Basophils Absolute: 0 10*3/uL (ref 0.0–0.2)
Basos: 1 %
EOS (ABSOLUTE): 0 10*3/uL (ref 0.0–0.4)
Eos: 0 %
Hematocrit: 43.5 % (ref 34.0–46.6)
Hemoglobin: 14.5 g/dL (ref 11.1–15.9)
Immature Grans (Abs): 0 10*3/uL (ref 0.0–0.1)
Immature Granulocytes: 0 %
Lymphocytes Absolute: 1.7 10*3/uL (ref 0.7–3.1)
Lymphs: 30 %
MCH: 31.7 pg (ref 26.6–33.0)
MCHC: 33.3 g/dL (ref 31.5–35.7)
MCV: 95 fL (ref 79–97)
Monocytes Absolute: 0.4 10*3/uL (ref 0.1–0.9)
Monocytes: 7 %
Neutrophils Absolute: 3.7 10*3/uL (ref 1.4–7.0)
Neutrophils: 62 %
Platelets: 266 10*3/uL (ref 150–450)
RBC: 4.57 x10E6/uL (ref 3.77–5.28)
RDW: 11.7 % (ref 11.7–15.4)
WBC: 5.8 10*3/uL (ref 3.4–10.8)

## 2021-07-12 LAB — LIPID PANEL W/O CHOL/HDL RATIO
Cholesterol, Total: 165 mg/dL (ref 100–199)
HDL: 69 mg/dL (ref >39)
LDL Chol Calc (NIH): 82 mg/dL (ref 0–99)
Triglycerides: 72 mg/dL (ref 0–149)
VLDL Cholesterol Cal: 14 mg/dL (ref 5–40)

## 2021-07-12 LAB — TSH: TSH: 0.956 u[IU]/mL (ref 0.450–4.500)

## 2021-07-12 LAB — ESTRADIOL: Estradiol: 545 pg/mL

## 2021-07-12 LAB — FOLLICLE STIMULATING HORMONE: FSH: 5.5 m[IU]/mL

## 2021-07-12 LAB — LUTEINIZING HORMONE: LH: 8 m[IU]/mL

## 2021-07-12 NOTE — Assessment & Plan Note (Signed)
NSR today. Continue to follow with cardiology. Call with any concerns. Labs drawn today.

## 2021-07-12 NOTE — Assessment & Plan Note (Signed)
In exacerbation. Will treat with steroid taper. Follow up with ortho as needed. Call with any concerns.

## 2021-07-12 NOTE — Assessment & Plan Note (Signed)
Stable. Continue to monitor. Continue to follow with cardiology. Call with any concerns.  

## 2021-07-12 NOTE — Assessment & Plan Note (Signed)
Under good control on current regimen. Continue current regimen. Continue to monitor. Call with any concerns. Refills given. Labs drawn today.   

## 2021-07-12 NOTE — Assessment & Plan Note (Signed)
Under good control on current regimen. Continue current regimen. Continue to monitor. Call with any concerns. Labs drawn today.  

## 2021-07-13 ENCOUNTER — Ambulatory Visit: Payer: Medicare PPO

## 2021-07-13 ENCOUNTER — Other Ambulatory Visit: Payer: Self-pay | Admitting: Family Medicine

## 2021-07-13 NOTE — Telephone Encounter (Signed)
Requested medication (s) are due for refill today: yes  Requested medication (s) are on the active medication list: no  Last refill:  04/15/21  Future visit scheduled: no  Notes to clinic:  medication was dc'd on 07/11/21. Please advise     Requested Prescriptions  Pending Prescriptions Disp Refills   meloxicam (MOBIC) 15 MG tablet [Pharmacy Med Name: MELOXICAM 15MG TABLETS] 90 tablet 1    Sig: TAKE 1 TABLET(15 MG) BY MOUTH DAILY     Analgesics:  COX2 Inhibitors Failed - 07/13/2021  7:55 AM      Failed - Manual Review: Labs are only required if the patient has taken medication for more than 8 weeks.      Passed - HGB in normal range and within 360 days    Hemoglobin  Date Value Ref Range Status  07/11/2021 14.5 11.1 - 15.9 g/dL Final          Passed - Cr in normal range and within 360 days    Creatinine  Date Value Ref Range Status  04/03/2012 0.97 0.60 - 1.30 mg/dL Final   Creatinine, Ser  Date Value Ref Range Status  07/11/2021 0.80 0.57 - 1.00 mg/dL Final          Passed - HCT in normal range and within 360 days    Hematocrit  Date Value Ref Range Status  07/11/2021 43.5 34.0 - 46.6 % Final          Passed - AST in normal range and within 360 days    AST  Date Value Ref Range Status  07/11/2021 27 0 - 40 IU/L Final   SGOT(AST)  Date Value Ref Range Status  04/03/2012 28 15 - 37 Unit/L Final          Passed - ALT in normal range and within 360 days    ALT  Date Value Ref Range Status  07/11/2021 23 0 - 32 IU/L Final   SGPT (ALT)  Date Value Ref Range Status  04/03/2012 29 12 - 78 U/L Final          Passed - eGFR is 30 or above and within 360 days    EGFR (African American)  Date Value Ref Range Status  04/03/2012 >60  Final   GFR calc Af Amer  Date Value Ref Range Status  07/10/2020 99 >59 mL/min/1.73 Final    Comment:    **In accordance with recommendations from the NKF-ASN Task force,**   [facility] is in the process of updating its eGFR  calculation to the   2021 CKD-EPI creatinine equation that estimates kidney function   without a race variable.    EGFR (Non-African Amer.)  Date Value Ref Range Status  04/03/2012 >60  Final    Comment:    eGFR values <52m/min/1.73 m2 may be an indication of chronic kidney disease (CKD). Calculated eGFR is useful in patients with stable renal function. The eGFR calculation will not be reliable in acutely ill patients when serum creatinine is changing rapidly. It is not useful in  patients on dialysis. The eGFR calculation may not be applicable to patients at the low and high extremes of body sizes, pregnant women, and vegetarians.    GFR calc non Af Amer  Date Value Ref Range Status  07/10/2020 86 >59 mL/min/1.73 Final   eGFR  Date Value Ref Range Status  07/11/2021 90 >59 mL/min/1.73 Final          Passed - Patient is not pregnant  Passed - Valid encounter within last 12 months    Recent Outpatient Visits           2 days ago Routine general medical examination at a health care facility   [facility], [place], DO   2 months ago Breast lump on left side at 2 o'clock position   [facility], Megan P, DO   4 months ago Needs flu shot   [place], [place], DO   10 months ago Depression, recurrent [facility])   [place], Megan P, DO   11 months ago Depression, recurrent [facility])   [place], Barb Merino, DO       Future Appointments             In 1 week  [facility], [place]   In 5 months [place], Barb Merino, DO [employer], [facility]

## 2021-07-23 ENCOUNTER — Ambulatory Visit: Payer: Medicare PPO

## 2021-07-30 DIAGNOSIS — M5412 Radiculopathy, cervical region: Secondary | ICD-10-CM | POA: Diagnosis not present

## 2021-07-31 ENCOUNTER — Ambulatory Visit (INDEPENDENT_AMBULATORY_CARE_PROVIDER_SITE_OTHER): Payer: Medicare PPO | Admitting: *Deleted

## 2021-07-31 ENCOUNTER — Other Ambulatory Visit: Payer: Self-pay | Admitting: Family Medicine

## 2021-07-31 DIAGNOSIS — Z0000 Encounter for general adult medical examination without abnormal findings: Secondary | ICD-10-CM

## 2021-07-31 DIAGNOSIS — Z1231 Encounter for screening mammogram for malignant neoplasm of breast: Secondary | ICD-10-CM

## 2021-07-31 NOTE — Progress Notes (Signed)
Subjective:   Angela [name] is a 51 y.o. female who presents for Medicare Annual (Subsequent) preventive examination.  I connected with  KARINNE SCHMADER on 07/31/21 by a telephone enabled telemedicine application and verified that I am speaking with the correct person using two identifiers.   I discussed the limitations of evaluation and management by telemedicine. The patient expressed understanding and agreed to proceed.  Patient location: home  Provider location: tele-health not in office     Review of Systems     Cardiac Risk Factors include: advanced age (>62men, >82 women);hypertension;family history of premature cardiovascular disease     Objective:    Today's Vitals   07/31/21 0858  PainSc: 3    There is no height or weight on file to calculate BMI.  Advanced Directives 07/31/2021 07/21/2020 09/07/2019 07/08/2019 01/23/2018 01/17/2017 12/11/2015  Does Patient Have a Medical Advance Directive? No No No Yes Yes No No  Type of Advance Directive - - - Living will;Healthcare Power of [place];Living will - -  Copy of [place] in Chart? - - - No - copy requested No - copy requested - -  Would patient like information on creating a medical advance directive? No - Patient declined - No - Patient declined - - No - Patient declined No - patient declined information    Current Medications (verified) Outpatient Encounter Medications as of 07/31/2021  Medication Sig   celecoxib (CELEBREX) 100 MG capsule Take by mouth.   Cyanocobalamin (VITAMIN B 12) 500 MCG TABS Take 1,000 mcg by mouth daily.   cyclobenzaprine (FLEXERIL) 5 MG tablet Take 5 mg by mouth as needed.   LORazepam (ATIVAN) 0.5 MG tablet TAKE 1-2 TABLET(0.5-1 MG) BY MOUTH DAILY AS NEEDED FOR ANXIETY   meloxicam (MOBIC) 15 MG tablet TAKE 1 TABLET(15 MG) BY MOUTH DAILY   methocarbamol (ROBAXIN) 500 MG tablet methocarbamol 500 mg tablet   Multiple Vitamins-Minerals  (MULTIVITAMINS THER. W/MINERALS) TABS Take 1 tablet by mouth daily.   traMADol (ULTRAM) 50 MG tablet Take 50 mg by mouth as needed.   venlafaxine XR (EFFEXOR XR) 75 MG 24 hr capsule Take 1 tab daily with her 150mg  for a total of 225mg  daily   venlafaxine XR (EFFEXOR-XR) 150 MG 24 hr capsule Take 1 capsule (150 mg total) by mouth daily with breakfast.   predniSONE (DELTASONE) 10 MG tablet 6 tabs today, 5 tabs tomorrow, decrease by 1 every day until gone (Patient not taking: Reported on 07/31/2021)   No facility-administered encounter medications on file as of 07/31/2021.    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 EF 50-55% normal coronaries   Complete heart block (HCC)    Depression    Dysautonomia (HCC)    Dyspnea    CT negative for PE, 2007   Hypertension    Insomnia    OCD (obsessive compulsive disorder)    SVT (supraventricular tachycardia) (HCC)    s/p ablation a. c/b AV nod ablation requiring pacemaker   Past Surgical History:  Procedure Laterality Date   BREAST BIOPSY Right 1998   BREAST BIOPSY Right 2004   BREAST LUMPECTOMY     RIGHT -BREAST    COLONOSCOPY WITH PROPOFOL N/A 09/07/2019   Procedure: COLONOSCOPY WITH PROPOFOL;  Surgeon: Lucilla Lame, MD;  Location: [facility] ENDOSCOPY;  Service: Endoscopy;  Laterality: N/A;   ENDOMETRIAL ABLATION  2005   INSERT / REPLACE / REMOVE PACEMAKER     PELVIC LAPAROSCOPY  1990   PERMANENT PACEMAKER GENERATOR CHANGE N/A 07/13/2014   MDT MRI compatible dual chamber pacemaker implanted by Dr Jeralene Peters  2005   TUBAL LIGATION  2003   Family History  Problem Relation Age of Onset   Hypertension Father    Cancer Father 70       COLON   Diabetes Father    Breast cancer Paternal Aunt    Cancer Paternal Aunt    Hypertension Maternal Grandfather    Diabetes Paternal Grandmother    Breast  cancer Paternal Grandmother    Arthritis Paternal Grandmother    Cancer Paternal Grandmother    Diabetes Paternal Grandfather    Cancer Paternal Grandfather    Breast cancer Maternal Aunt        mastectomy   Cancer Maternal Aunt    Anxiety disorder Mother    Arthritis Mother    Obesity Mother    Dementia Maternal Grandmother    Breast cancer Paternal Aunt    Anxiety disorder Sister    Social History   Socioeconomic History   Marital status: Married    Spouse name: Not on file   Number of children: Not on file   Years of education: Not on file   Highest education level: Bachelor's degree (e.g., BA, AB, BS)  Occupational History   Occupation: disability   Tobacco Use   Smoking status: Never   Smokeless tobacco: Never  Vaping Use   Vaping Use: Never used  Substance and Sexual Activity   Alcohol use: No   Drug use: No   Sexual activity: Yes    Birth control/protection: Surgical    Comment: Uterine ablarion 2002  Other Topics Concern   Not on file  Social History Narrative   Work with daughters school    Social Determinants of Health   Financial Resource Strain: Low Risk    Difficulty of Paying Living Expenses: Not hard at all  Food Insecurity: No Food Insecurity   Worried About Charity fundraiser in the Last Year: Never true   Arboriculturist in the Last Year: Never true  Transportation Needs: No Transportation Needs   Lack of Transportation (Medical): No   Lack of Transportation (Non-Medical): No  Physical Activity: Insufficiently Active   Days of Exercise per Week: 2 days   Minutes of Exercise per Session: 20 min  Stress: No Stress Concern Present   Feeling of Stress : Only a little  Social Connections: Engineer, building services of Communication with Friends and Family: Three times a week   Frequency of Social Gatherings with Friends and Family: Three times a week   Attends Religious Services: More than 4 times per year   Active Member of Clubs or  Organizations: Yes   Attends Music therapist: More than 4 times per year   Marital Status: Married    Tobacco Counseling Counseling given: Not Answered   Clinical Intake:  Pre-visit preparation completed: Yes  Pain : 0-10 Pain Score: 3  Pain Type: Chronic pain Pain Location: Back Pain Descriptors / Indicators: Aching, Burning Pain Onset: 1 to 4 weeks ago Pain Relieving Factors: tylenol  Pain Relieving Factors: tylenol  Nutritional Risks: None Diabetes: No  How often do you need to have someone help you when you read instructions, pamphlets, or other written materials  from your doctor or pharmacy?: 1 - Never  Diabetic?  no  Interpreter Needed?: No  Information entered by :: Leroy Kennedy  LPN   Activities of Daily Living In your present state of health, do you have any difficulty performing the following activities: 07/31/2021  Hearing? N  Vision? N  Difficulty concentrating or making decisions? N  Walking or climbing stairs? N  Dressing or bathing? N  Doing errands, shopping? N  Preparing Food and eating ? N  Using the Toilet? N  In the past six months, have you accidently leaked urine? N  Do you have problems with loss of bowel control? N  Managing your Medications? N  Managing your Finances? N  Some recent data might be hidden    Patient Care Team: Valerie Roys, DO as PCP - General (Family Medicine)  Indicate any recent Medical Services you may have received from other than [facility] providers in the past year (date may be approximate).     Assessment:   This is a routine wellness examination for Kristiane.  Hearing/Vision screen Hearing Screening - Comments:: No trouble hearing Vision Screening - Comments:: Up to date [facility]  Dietary issues and exercise activities discussed: Current Exercise Habits: Home exercise routine, Type of exercise: strength training/weights, Time (Minutes): 20, Frequency (Times/Week): 3, Weekly Exercise  (Minutes/Week): 60, Intensity: Mild, Exercise limited by: orthopedic condition(s)   Goals Addressed             This Visit's Progress    Weight (lb) < 200 lb (90.7 kg)       Increase activity         Depression Screen PHQ 2/9 Scores 07/31/2021 07/11/2021 04/17/2021 03/08/2021 09/06/2020 08/08/2020 07/21/2020  PHQ - 2 Score 2 2 0 0 2 3 0  PHQ- 9 Score 5 9 5 4 7 11  -    Fall Risk Fall Risk  07/31/2021 07/11/2021 07/21/2020 03/24/2020 07/08/2019  Falls in the past year? 0 0 1 0 1  Comment - - got dizzy, not sure what happened - -  Number falls in past yr: 0 0 1 0 0  Comment - - - - November, passed out in the gym, fell off eliptical.  Injury with Fall? 0 0 0 0 0  Comment - - - - just some bruising  Risk for fall due to : - No Fall Risks Medication side effect;Other (Comment) No Fall Risks -  Risk for fall due to: Comment - - gets dizzy regularly - -  Follow up Falls evaluation completed;Falls prevention discussed Falls evaluation completed Falls evaluation completed;Education provided;Falls prevention discussed Falls evaluation completed -    FALL RISK PREVENTION PERTAINING TO THE HOME:  Any stairs in or around the home? No  If so, are there any without handrails? No  Home free of loose throw rugs in walkways, pet beds, electrical cords, etc? Yes  Adequate lighting in your home to reduce risk of falls? Yes   ASSISTIVE DEVICES UTILIZED TO PREVENT FALLS:  Life alert? No  Use of a cane, walker or w/c? Yes  Grab bars in the bathroom? No  Shower chair or bench in shower? Yes  Elevated toilet seat or a handicapped toilet? No   TIMED UP AND GO:  Was the test performed? No .    Cognitive Function:  Normal cognitive status assessed by direct observation by this Nurse Health Advisor. No abnormalities found.       6CIT Screen 07/21/2020 07/09/2019 01/23/2018 01/17/2017  What Year? 0  points 0 points 0 points 0 points  What month? 0 points 0 points 0 points 0 points  What time? 0 points 0  points 0 points 0 points  Count back from 20 0 points 0 points 0 points 0 points  Months in reverse 0 points 0 points 0 points 0 points  Repeat phrase 4 points 2 points 0 points 2 points  Total Score 4 2 0 2    Immunizations Immunization History  Administered Date(s) Administered   Influenza,inj,Quad PF,6+ Mos 02/27/2017, 03/18/2018, 03/19/2019, 02/14/2020, 03/08/2021   PFIZER(Purple Top)SARS-COV-2 Vaccination 08/30/2019, 09/22/2019, 05/04/2020   Td 07/11/2021   Tdap 08/02/2011    TDAP status: Up to date  Flu Vaccine status: Up to date    Covid-19 vaccine status: Information provided on how to obtain vaccines.   Qualifies for Shingles Vaccine? Yes   Zostavax completed No   Shingrix Completed?: No.    Education has been provided regarding the importance of this vaccine. Patient has been advised to call insurance company to determine out of pocket expense if they have not yet received this vaccine. Advised may also receive vaccine at local pharmacy or Health Dept. Verbalized acceptance and understanding.  Screening Tests Health Maintenance  Topic Date Due   COVID-19 Vaccine (4 - Booster for Pfizer series) 06/29/2020   Zoster Vaccines- Shingrix (1 of 2) 10/08/2021 (Originally 03/20/2021)   MAMMOGRAM  09/13/2021   COLONOSCOPY (Pts 45-48yrs Insurance coverage will need to be confirmed)  09/06/2024   PAP SMEAR-Modifier  07/10/2025   TETANUS/TDAP  07/12/2031   INFLUENZA VACCINE  Completed   Hepatitis C Screening  Completed   HIV Screening  Completed   HPV VACCINES  Aged Out    Health Maintenance  Health Maintenance Due  Topic Date Due   COVID-19 Vaccine (4 - Booster for [place] series) 06/29/2020    Colorectal cancer screening: Type of screening: Colonoscopy. Completed 2021. Repeat every 5 years  Mammogram status: Ordered  . Pt provided with contact info and advised to call to schedule appt.     Lung Cancer Screening: (Low Dose CT Chest recommended if Age 11-80 years,  30 pack-year currently smoking OR have quit w/in 15years.) does not qualify.   Lung Cancer Screening Referral:   Additional Screening:  Hepatitis C Screening: does not qualify; Completed 2022  Vision Screening: Recommended annual ophthalmology exams for early detection of glaucoma and other disorders of the eye. Is the patient up to date with their annual eye exam?  Yes  Who is the provider or what is the name of the office in which the patient attends annual eye exams? [facility] If pt is not established with a provider, would they like to be referred to a provider to establish care? No .   Dental Screening: Recommended annual dental exams for proper oral hygiene  Community Resource Referral / Chronic Care Management: CRR required this visit?  No   CCM required this visit?  No      Plan:     I have personally reviewed and noted the following in the patients chart:   Medical and social history Use of alcohol, tobacco or illicit drugs  Current medications and supplements including opioid prescriptions.  Functional ability and status Nutritional status Physical activity Advanced directives List of other physicians Hospitalizations, surgeries, and ER visits in previous 12 months Vitals Screenings to include cognitive, depression, and falls Referrals and appointments  In addition, I have reviewed and discussed with patient certain preventive protocols, quality metrics, and  best practice recommendations. A written personalized care plan for preventive services as well as general preventive health recommendations were provided to patient.     Leroy Kennedy, LPN   [id number]   Nurse Notes:

## 2021-07-31 NOTE — Patient Instructions (Signed)
Angela [name] , Thank you for taking time to come for your Medicare Wellness Visit. I appreciate your ongoing commitment to your health goals. Please review the following plan we discussed and let me know if I can assist you in the future.   Screening recommendations/referrals: Colonoscopy: up to date Mammogram: ordered  Recommended yearly ophthalmology/optometry visit for glaucoma screening and checkup Recommended yearly dental visit for hygiene and checkup  Vaccinations: Influenza vaccine: up to date Pneumococcal  Tdap vaccine: up to date Shingles vaccine: Education provided    Advanced directives: Education provided  Conditions/risks identified:   Next appointment: 01-08-2022 @ 8:40 [facility] 65 Years and Older, Female Preventive care refers to lifestyle choices and visits with your health care provider that can promote health and wellness. What does preventive care include? A yearly physical exam. This is also called an annual well check. Dental exams once or twice a year. Routine eye exams. Ask your health care provider how often you should have your eyes checked. Personal lifestyle choices, including: Daily care of your teeth and gums. Regular physical activity. Eating a healthy diet. Avoiding tobacco and drug use. Limiting alcohol use. Practicing safe sex. Taking low-dose aspirin every day. Taking vitamin and mineral supplements as recommended by your health care provider. What happens during an annual well check? The services and screenings done by your health care provider during your annual well check will depend on your age, overall health, lifestyle risk factors, and family history of disease. Counseling  Your health care provider may ask you questions about your: Alcohol use. Tobacco use. Drug use. Emotional well-being. Home and relationship well-being. Sexual activity. Eating habits. History of falls. Memory and ability to understand  (cognition). Work and work Statistician. Reproductive health. Screening  You may have the following tests or measurements: Height, weight, and BMI. Blood pressure. Lipid and cholesterol levels. These may be checked every 5 years, or more frequently if you are over 59 years old. Skin check. Lung cancer screening. You may have this screening every year starting at age 63 if you have a 30-pack-year history of smoking and currently smoke or have quit within the past 15 years. Fecal occult blood test (FOBT) of the stool. You may have this test every year starting at age 59. Flexible sigmoidoscopy or colonoscopy. You may have a sigmoidoscopy every 5 years or a colonoscopy every 10 years starting at age 80. Hepatitis C blood test. Hepatitis B blood test. Sexually transmitted disease (STD) testing. Diabetes screening. This is done by checking your blood sugar (glucose) after you have not eaten for a while (fasting). You may have this done every 1-3 years. Bone density scan. This is done to screen for osteoporosis. You may have this done starting at age 88. Mammogram. This may be done every 1-2 years. Talk to your health care provider about how often you should have regular mammograms. Talk with your health care provider about your test results, treatment options, and if necessary, the need for more tests. Vaccines  Your health care provider may recommend certain vaccines, such as: Influenza vaccine. This is recommended every year. Tetanus, diphtheria, and acellular pertussis (Tdap, Td) vaccine. You may need a Td booster every 10 years. Zoster vaccine. You may need this after age 64. Pneumococcal 13-valent conjugate (PCV13) vaccine. One dose is recommended after age 60. Pneumococcal polysaccharide (PPSV23) vaccine. One dose is recommended after age 98. Talk to your health care provider about which screenings and vaccines you need and how  often you need them. This information is not intended to  replace advice given to you by your health care provider. Make sure you discuss any questions you have with your health care provider. Document Released: 06/16/2015 Document Revised: 02/07/2016 Document Reviewed: 03/21/2015 Elsevier Interactive Patient Education  2017 [place] Prevention in the Home Falls can cause injuries. They can happen to people of all ages. There are many things you can do to make your home safe and to help prevent falls. What can I do on the outside of my home? Regularly fix the edges of walkways and driveways and fix any cracks. Remove anything that might make you trip as you walk through a door, such as a raised step or threshold. Trim any bushes or trees on the path to your home. Use bright outdoor lighting. Clear any walking paths of anything that might make someone trip, such as rocks or tools. Regularly check to see if handrails are loose or broken. Make sure that both sides of any steps have handrails. Any raised decks and porches should have guardrails on the edges. Have any leaves, snow, or ice cleared regularly. Use sand or salt on walking paths during winter. Clean up any spills in your garage right away. This includes oil or grease spills. What can I do in the bathroom? Use night lights. Install grab bars by the toilet and in the tub and shower. Do not use towel bars as grab bars. Use non-skid mats or decals in the tub or shower. If you need to sit down in the shower, use a plastic, non-slip stool. Keep the floor dry. Clean up any water that spills on the floor as soon as it happens. Remove soap buildup in the tub or shower regularly. Attach bath mats securely with double-sided non-slip rug tape. Do not have throw rugs and other things on the floor that can make you trip. What can I do in the bedroom? Use night lights. Make sure that you have a light by your bed that is easy to reach. Do not use any sheets or blankets that are too big for  your bed. They should not hang down onto the floor. Have a firm chair that has side arms. You can use this for support while you get dressed. Do not have throw rugs and other things on the floor that can make you trip. What can I do in the kitchen? Clean up any spills right away. Avoid walking on wet floors. Keep items that you use a lot in easy-to-reach places. If you need to reach something above you, use a strong step stool that has a grab bar. Keep electrical cords out of the way. Do not use floor polish or wax that makes floors slippery. If you must use wax, use non-skid floor wax. Do not have throw rugs and other things on the floor that can make you trip. What can I do with my stairs? Do not leave any items on the stairs. Make sure that there are handrails on both sides of the stairs and use them. Fix handrails that are broken or loose. Make sure that handrails are as long as the stairways. Check any carpeting to make sure that it is firmly attached to the stairs. Fix any carpet that is loose or worn. Avoid having throw rugs at the top or bottom of the stairs. If you do have throw rugs, attach them to the floor with carpet tape. Make sure that you have a  light switch at the top of the stairs and the bottom of the stairs. If you do not have them, ask someone to add them for you. What else can I do to help prevent falls? Wear shoes that: Do not have high heels. Have rubber bottoms. Are comfortable and fit you well. Are closed at the toe. Do not wear sandals. If you use a stepladder: Make sure that it is fully opened. Do not climb a closed stepladder. Make sure that both sides of the stepladder are locked into place. Ask someone to hold it for you, if possible. Clearly mark and make sure that you can see: Any grab bars or handrails. First and last steps. Where the edge of each step is. Use tools that help you move around (mobility aids) if they are needed. These  include: Canes. Walkers. Scooters. Crutches. Turn on the lights when you go into a dark area. Replace any light bulbs as soon as they burn out. Set up your furniture so you have a clear path. Avoid moving your furniture around. If any of your floors are uneven, fix them. If there are any pets around you, be aware of where they are. Review your medicines with your doctor. Some medicines can make you feel dizzy. This can increase your chance of falling. Ask your doctor what other things that you can do to help prevent falls. This information is not intended to replace advice given to you by your health care provider. Make sure you discuss any questions you have with your health care provider. Document Released: 03/16/2009 Document Revised: 10/26/2015 Document Reviewed: 06/24/2014 Elsevier Interactive Patient Education  2017 [employer].

## 2021-08-08 ENCOUNTER — Ambulatory Visit (INDEPENDENT_AMBULATORY_CARE_PROVIDER_SITE_OTHER): Payer: Medicare PPO

## 2021-08-08 DIAGNOSIS — I442 Atrioventricular block, complete: Secondary | ICD-10-CM | POA: Diagnosis not present

## 2021-08-08 LAB — CUP PACEART REMOTE DEVICE CHECK
Battery Remaining Longevity: 24 mo
Battery Voltage: 2.93 V
Brady Statistic AP VP Percent: 2.42 %
Brady Statistic AP VS Percent: 0.03 %
Brady Statistic AS VP Percent: 97.54 %
Brady Statistic AS VS Percent: 0.01 %
Brady Statistic RA Percent Paced: 2.44 %
Brady Statistic RV Percent Paced: 99.96 %
Date Time Interrogation Session: 20230308104850
Implantable Lead Implant Date: 20070319
Implantable Lead Implant Date: 20070319
Implantable Lead Location: 753859
Implantable Lead Location: 753860
Implantable Lead Model: 5076
Implantable Lead Model: 5076
Implantable Pulse Generator Implant Date: 20160210
Lead Channel Impedance Value: 285 Ohm
Lead Channel Impedance Value: 399 Ohm
Lead Channel Impedance Value: 456 Ohm
Lead Channel Impedance Value: 475 Ohm
Lead Channel Pacing Threshold Amplitude: 0.75 V
Lead Channel Pacing Threshold Amplitude: 0.875 V
Lead Channel Pacing Threshold Pulse Width: 0.4 ms
Lead Channel Pacing Threshold Pulse Width: 0.4 ms
Lead Channel Sensing Intrinsic Amplitude: 1.875 mV
Lead Channel Sensing Intrinsic Amplitude: 1.875 mV
Lead Channel Sensing Intrinsic Amplitude: 5.375 mV
Lead Channel Sensing Intrinsic Amplitude: 5.375 mV
Lead Channel Setting Pacing Amplitude: 2.25 V
Lead Channel Setting Pacing Amplitude: 2.5 V
Lead Channel Setting Pacing Pulse Width: 0.4 ms
Lead Channel Setting Sensing Sensitivity: 2 mV

## 2021-08-11 ENCOUNTER — Telehealth: Payer: Self-pay | Admitting: Family Medicine

## 2021-08-11 DIAGNOSIS — F3342 Major depressive disorder, recurrent, in full remission: Secondary | ICD-10-CM

## 2021-08-13 NOTE — Telephone Encounter (Signed)
Refilled 07/11/2021 #90 1 refill - 6 month supply ?Requested Prescriptions  ?Pending Prescriptions Disp Refills  ?? venlafaxine XR (EFFEXOR-XR) 150 MG 24 hr capsule [Pharmacy Med Name: VENLAFAXINE ER '150MG'$  CAPSULES] 90 capsule 1  ?  Sig: TAKE 1 CAPSULE(150 MG) BY MOUTH DAILY WITH BREAKFAST  ?  ? Psychiatry: Antidepressants - SNRI - desvenlafaxine & venlafaxine Failed - 08/11/2021 10:22 AM  ?  ?  Failed - Lipid Panel in normal range within the last 12 months  ?  Cholesterol, Total  ?Date Value Ref Range Status  ?07/11/2021 165 100 - 199 mg/dL Final  ? ?LDL Chol Calc (NIH)  ?Date Value Ref Range Status  ?07/11/2021 82 0 - 99 mg/dL Final  ? ?HDL  ?Date Value Ref Range Status  ?07/11/2021 69 >39 mg/dL Final  ? ?Triglycerides  ?Date Value Ref Range Status  ?07/11/2021 72 0 - 149 mg/dL Final  ? ?  ?  ?  Passed - Cr in normal range and within 360 days  ?  Creatinine  ?Date Value Ref Range Status  ?04/03/2012 0.97 0.60 - 1.30 mg/dL Final  ? ?Creatinine, Ser  ?Date Value Ref Range Status  ?07/11/2021 0.80 0.57 - 1.00 mg/dL Final  ?   ?  ?  Passed - Completed PHQ-2 or PHQ-9 in the last 360 days  ?  ?  Passed - Last BP in normal range  ?  BP Readings from Last 1 Encounters:  ?07/11/21 116/81  ?   ?  ?  Passed - Valid encounter within last 6 months  ?  Recent Outpatient Visits   ?      ? 1 month ago Routine general medical examination at a health care facility  ? [place], Megan P, DO  ? 3 months ago Breast lump on left side at 2 o'clock position  ? [place], DO  ? 5 months ago Needs flu shot  ? [place], DO  ? 11 months ago Depression, recurrent ([place])  ? [place], DO  ? 1 year ago Depression, recurrent ([place])  ? [place], [place] P, DO  ?  ?  ?Future Appointments   ?        ? In 4 months Wynetta Emery, Barb Merino, DO [facility]  ?  ? ?  ?  ?  ? ?

## 2021-08-13 NOTE — Telephone Encounter (Signed)
Pt called to report that she is completely out of her current supply of 150 MG, please advise.  ?

## 2021-08-13 NOTE — Telephone Encounter (Addendum)
[place] called and spoke to [place], Merchant navy officer about the refill(s) Venlafaxine 150 mg requested. Advised it was sent on 07/11/21 #90/1 refill(s). She says they have it and will refill it, the patient will receive notification when it's ready for pickup. Patient called and made aware. ? ?

## 2021-08-20 ENCOUNTER — Ambulatory Visit (HOSPITAL_COMMUNITY)
Admission: RE | Admit: 2021-08-20 | Discharge: 2021-08-20 | Disposition: A | Discharge status: Home / Self Care | Payer: Medicare PPO | Source: Ambulatory Visit | Attending: Neurological Surgery | Admitting: Neurological Surgery

## 2021-08-20 ENCOUNTER — Other Ambulatory Visit: Payer: Self-pay

## 2021-08-20 DIAGNOSIS — M47812 Spondylosis without myelopathy or radiculopathy, cervical region: Secondary | ICD-10-CM | POA: Diagnosis not present

## 2021-08-20 DIAGNOSIS — M5412 Radiculopathy, cervical region: Secondary | ICD-10-CM | POA: Insufficient documentation

## 2021-08-20 IMAGING — MR MR CERVICAL SPINE W/O CM
4 of 5 series · 17 of 48 positions shown · non-contrast
Comparison: Cervical spine MRI [DATE], cervical spine CT
[DATE]

CLINICAL DATA: Losing functionality and arms, worsening

EXAM:
MRI CERVICAL SPINE WITHOUT CONTRAST
TECHNIQUE: Multiplanar, multisequence MR imaging of the cervical spine was
performed. No intravenous contrast was administered.

[Series 2: T2 · sagittal · 3.0mm · 0.37mm/px · 5 of 13 slices shown (1 of 2)]
[im 1/13]
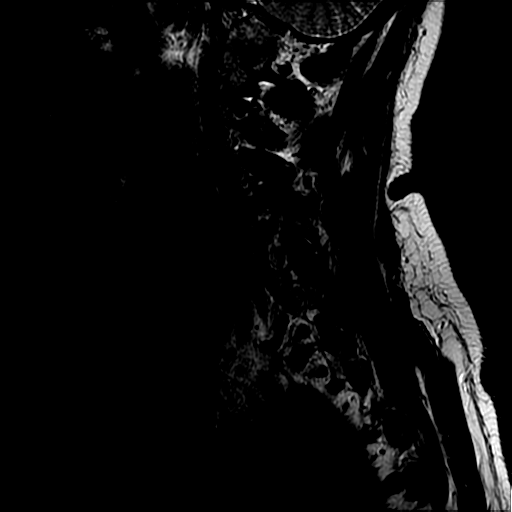
[im 4/13]
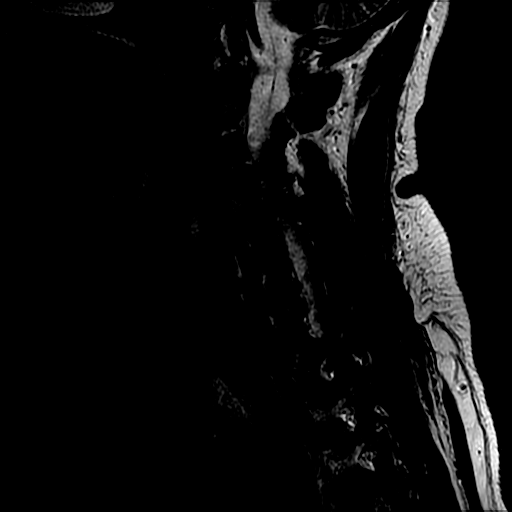
[im 7/13]
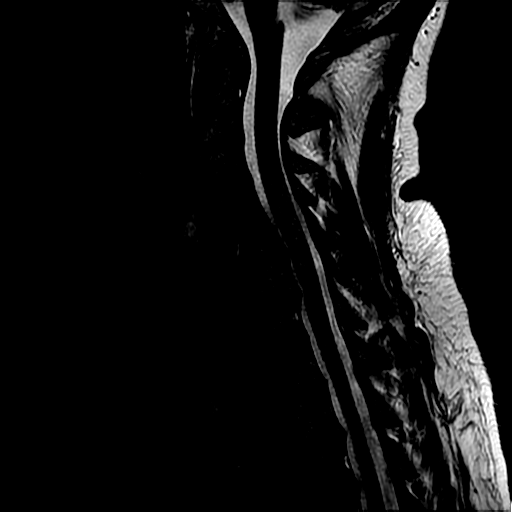
[im 10/13]
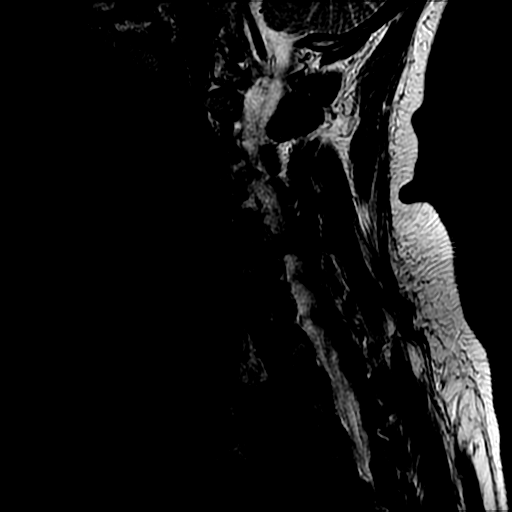
[im 13/13]
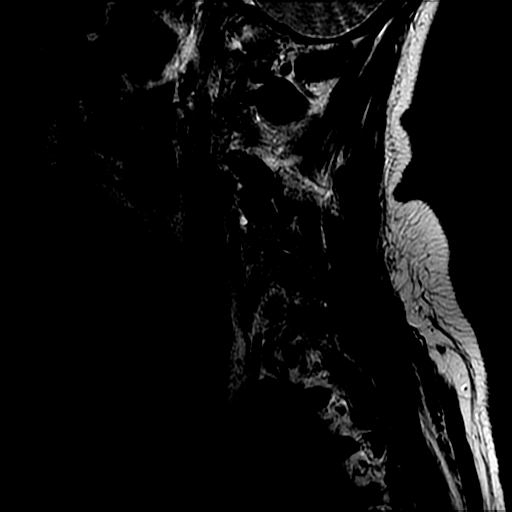

[Series 3: T1 · sagittal · 3.0mm · 0.37mm/px · 3 of 13 slices shown]
[im 1/13]
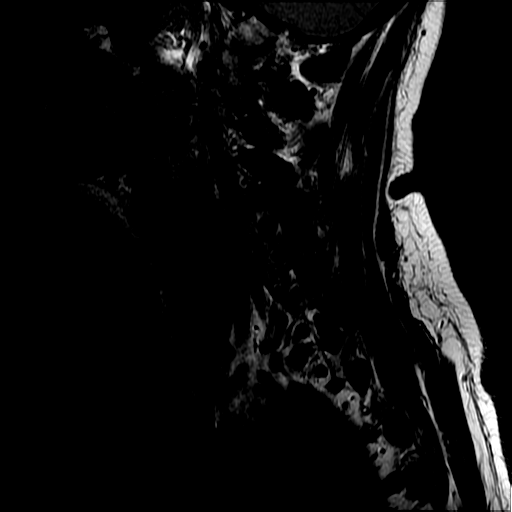
[im 7/13]
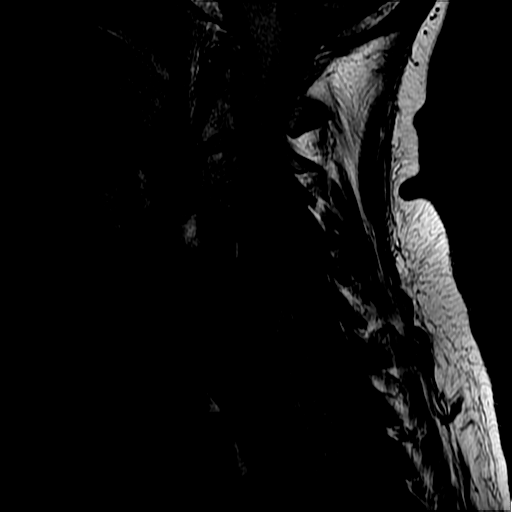
[im 13/13]
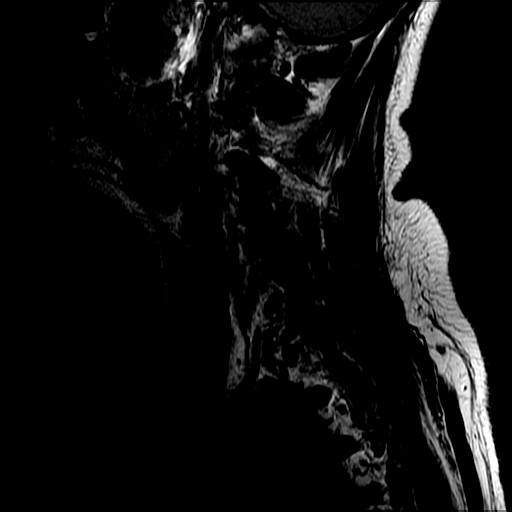

[Series 4: sag ir · sagittal · 3.0mm · 0.37mm/px · 3 of 13 slices shown]
[im 3/13]
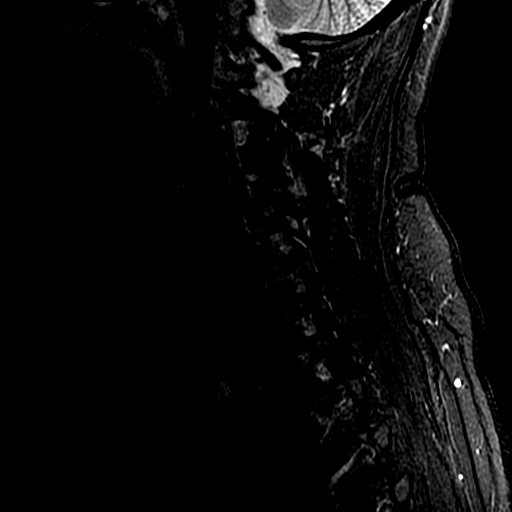
[im 8/13]
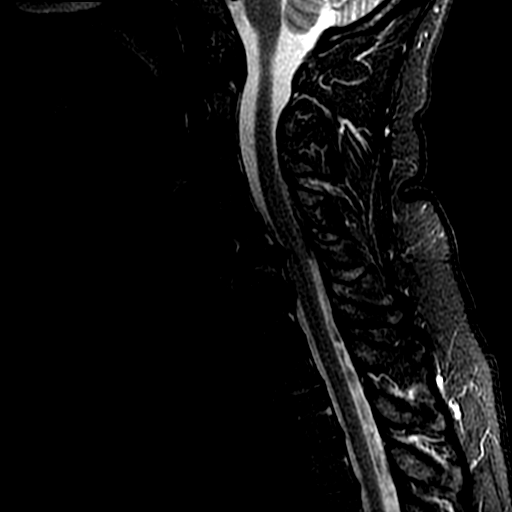
[im 13/13]
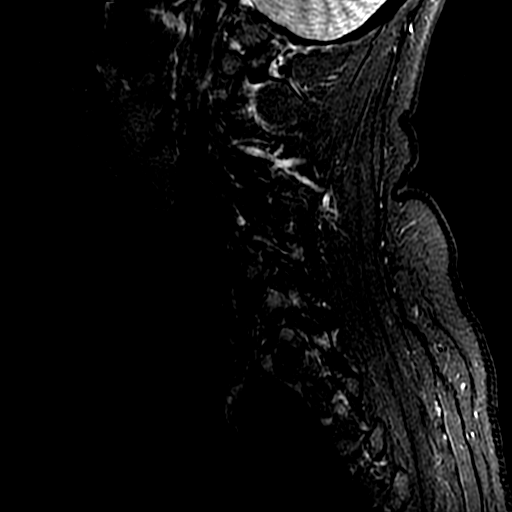

[Series 6: T2 · axial · 3.0mm · 0.39mm/px · z∈[-77,+16]mm · 6 of 36 slices shown (2 of 2)]
[im 3/36]
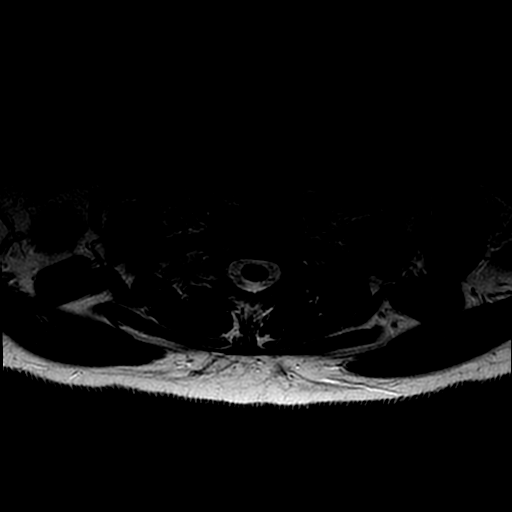
[im 5/36]
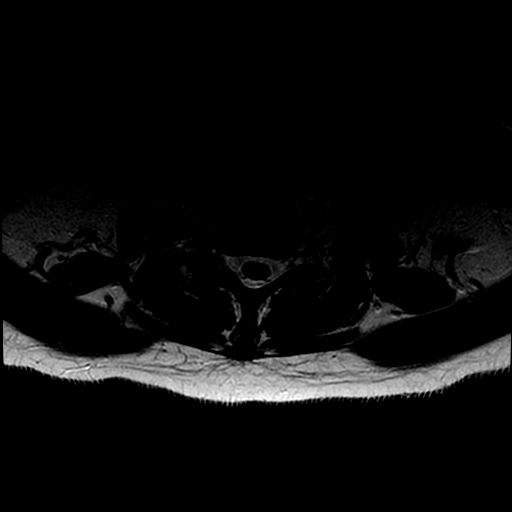
[im 8/36]
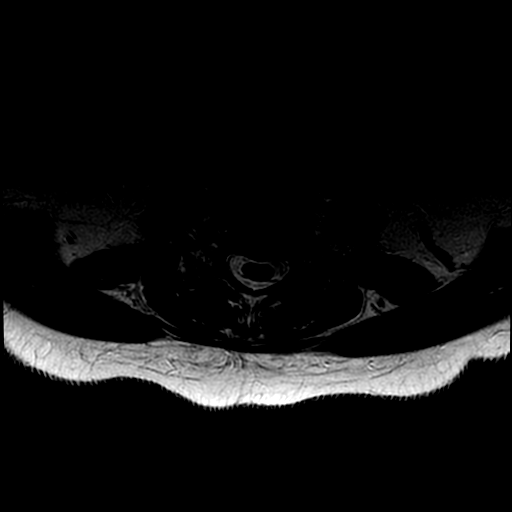
[im 12/36]
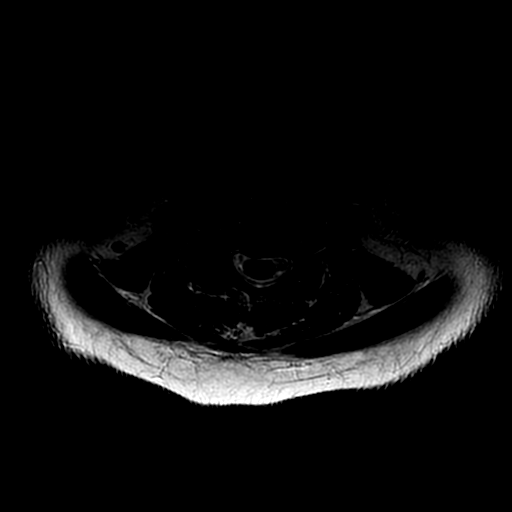
[im 19/36]
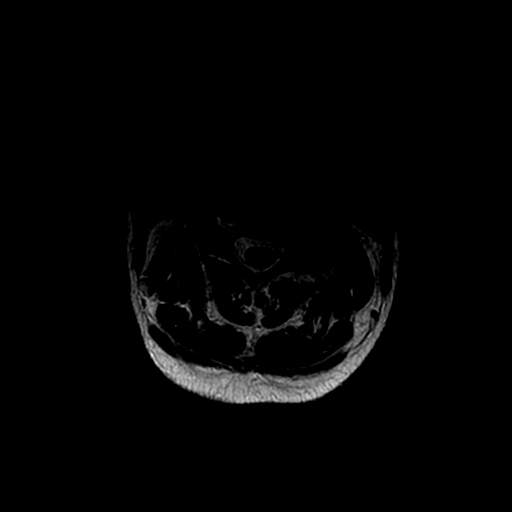
[im 31/36]
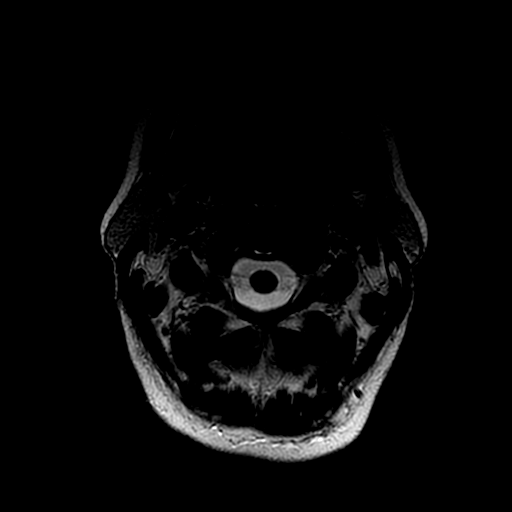

[17 of 48 positions shown; findings below may reference images not displayed]

FINDINGS: Alignment: Is straightening of the normal cervical lordosis with
slight reversal centered at C5-C6, similar to the prior study. There
is minimal grade 1 anterolisthesis of C4 on C5 and C5 on C6,
unchanged.

Vertebrae: Marrow signal is normal. Vertebral body heights are
preserved. There is no suspicious marrow signal abnormality. There
is mild edema in the left posterior elements at C5 and right
posterior elements at C5-C6.

Cord: Normal in signal and morphology.

Posterior Fossa, vertebral arteries, paraspinal tissues: The imaged
posterior fossa is unremarkable. The vertebral artery flow voids are
normal. There is mild perifacetal soft tissue edema on the right at
C5-C6. The paraspinal soft tissues are otherwise unremarkable.

Disc levels:

There is mild disc desiccation without significant loss of height
throughout the cervical spine.

C2-C3: There is mild uncovertebral and left facet arthropathy
without significant spinal canal or neural foraminal stenosis, not
significantly changed.

C3-C4: There is mild uncovertebral spurring on the left and left
worse than right facet arthropathy resulting in moderate left and no
significant right neural foraminal stenosis and no significant
spinal canal stenosis, worsened since [EG].

C4-C5: There is left uncovertebral spurring and bulky left facet
arthropathy resulting in moderate left and mild right neural
foraminal stenosis without significant spinal canal stenosis,
minimally worsened since [EG].

C5-C6: There is a posterior disc osteophyte complex with right worse
than left uncovertebral ridging and bilateral facet arthropathy
resulting in mild spinal canal stenosis with partial effacement of
the ventral thecal sac and severe right and mild left neural
foraminal stenosis, progressed since [EG]. Additionally, there is
mild edema in the posterior elements on the right with an associated
small facet joint effusion and mild surrounding edema.

C6-C7: There is a mild posterior disc osteophyte complex with left
worse than right uncovertebral ridging and mild bilateral facet
arthropathy resulting in mild-to-moderate left and mild right neural
foraminal stenosis without significant spinal canal stenosis, not
significantly changed since [EG].

C7-T1: No significant spinal canal or neural foraminal stenosis.
IMPRESSION: 1. Degenerative changes in the cervical spine as detailed above have
overall slightly progressed since [EG]. Findings are most advanced
at C3-C4 and C4-C5 where there is moderate left neural foraminal
stenosis, and C5-C6 where there is severe right and mild left neural
foraminal stenosis. No more than mild spinal canal stenosis
throughout the cervical spine.
2. Multilevel facet arthropathy. There is mild reactive marrow edema
in the posterior elements on the left at C4-C5 and on the right at
C5-C6 with associated perifacetal soft tissue edema and a small
effusion at C5-C6. These findings may reflect a source of pain.

## 2021-08-20 NOTE — Progress Notes (Signed)
Per order, "Changed device settings for MRI to DOO at 95 bpm  ? ?Tachy-therapies to off if applicable.  ? ?Will program device back to pre-MRI settings after completion of exam" ?

## 2021-08-20 NOTE — Progress Notes (Signed)
Informed of MRI for today.  ? ?Device system confirmed to be MRI conditional, with implant date > 6 weeks ago, and no evidence of abandoned or epicardial leads in review of most recent CXR ?Interrogation from today reviewed, pt is currently AS-VP at ~85-89 bpm ?Change device settings for MRI to DOO at 95 bpm ? ?Tachy-therapies to off if applicable. ? ?Program device back to pre-MRI settings after completion of exam. ? ?Shirley Friar, PA-C  ?08/20/2021 12:59 PM   ?

## 2021-08-22 NOTE — Progress Notes (Signed)
Remote pacemaker transmission.   

## 2021-09-03 DIAGNOSIS — R030 Elevated blood-pressure reading, without diagnosis of hypertension: Secondary | ICD-10-CM | POA: Diagnosis not present

## 2021-09-03 DIAGNOSIS — M5412 Radiculopathy, cervical region: Secondary | ICD-10-CM | POA: Diagnosis not present

## 2021-09-03 DIAGNOSIS — M4802 Spinal stenosis, cervical region: Secondary | ICD-10-CM | POA: Diagnosis not present

## 2021-09-03 DIAGNOSIS — M47812 Spondylosis without myelopathy or radiculopathy, cervical region: Secondary | ICD-10-CM | POA: Diagnosis not present

## 2021-09-04 ENCOUNTER — Other Ambulatory Visit: Payer: Self-pay | Admitting: Neurological Surgery

## 2021-09-05 ENCOUNTER — Other Ambulatory Visit: Payer: Self-pay | Admitting: Neurological Surgery

## 2021-09-13 NOTE — Pre-Procedure Instructions (Incomplete)
Surgical Instructions ? ? ? Your procedure is scheduled on 09/20/21. ? Report to [facility] Main Entrance "A" at 08:00 A.M., then check in with the Admitting office. ? Call this number if you have problems the morning of surgery: ? [phone] ? ? If you have any questions prior to your surgery date call [phone]: Open Monday-Friday 8am-4pm ? ? ? Remember: ? Do not eat or drink after midnight the night before your surgery ?  ? Take these medicines the morning of surgery with A SIP OF WATER:  ?celecoxib (CELEBREX, venlafaxine XR (EFFEXOR XR) ? ?As of today, STOP taking any Aspirin (unless otherwise instructed by your surgeon) Aleve, Naproxen, Ibuprofen, Motrin, Advil, Goody's, BC's, all herbal medications, fish oil, and all vitamins. ? ?         ?Do not wear jewelry or makeup ?Do not wear lotions, powders, perfumes/colognes, or deodorant. ?Do not shave 48 hours prior to surgery.  Men may shave face and neck. ?Do not bring valuables to the hospital. ?Do not wear nail polish, gel polish, artificial nails, or any other type of covering on natural nails (fingers and toes) ?If you have artificial nails or gel coating that need to be removed by a nail salon, please have this removed prior to surgery. Artificial nails or gel coating may interfere with anesthesia's ability to adequately monitor your vital signs. ? ?[facility] is not responsible for any belongings or valuables. .  ? ?Do NOT Smoke (Tobacco/Vaping)  24 hours prior to your procedure ? ?If you use a CPAP at night, you may bring your mask for your overnight stay. ?  ?Contacts, glasses, hearing aids, dentures or partials may not be worn into surgery, please bring cases for these belongings ?  ?For patients admitted to the hospital, discharge time will be determined by your treatment team. ?  ?Patients discharged the day of surgery will not be allowed to drive home, and someone needs to stay with them for 24 hours. ? ? ?SURGICAL WAITING ROOM  VISITATION ?Patients having surgery or a procedure in a hospital may have two support people. ?Children under the age of 69 must have an adult with them who is not the patient. ?They may stay in the waiting area during the procedure and may switch out with other visitors. If the patient needs to stay at the hospital during part of their recovery, the visitor guidelines for inpatient rooms apply. ? ?Please refer to the [facility] website for the visitor guidelines for Inpatients (after your surgery is over and you are in a regular room).  ? ?Special instructions:   ? ?Oral Hygiene is also important to reduce your risk of infection.  Remember - BRUSH YOUR TEETH THE MORNING OF SURGERY WITH YOUR REGULAR TOOTHPASTE ? ? ?- Preparing For Surgery ? ?Before surgery, you can play an important role. Because skin is not sterile, your skin needs to be as free of germs as possible. You can reduce the number of germs on your skin by washing with CHG (chlorahexidine gluconate) Soap before surgery.  CHG is an antiseptic cleaner which kills germs and bonds with the skin to continue killing germs even after washing.   ? ? ?Please do not use if you have an allergy to CHG or antibacterial soaps. If your skin becomes reddened/irritated stop using the CHG.  ?Do not shave (including legs and underarms) for at least 48 hours prior to first CHG shower. It is OK to shave your face. ? ?Please follow  these instructions carefully. ?  ? ? Shower the NIGHT BEFORE SURGERY and the MORNING OF SURGERY with CHG Soap.  ? If you chose to wash your hair, wash your hair first as usual with your normal shampoo. After you shampoo, rinse your hair and body thoroughly to remove the shampoo.  Then [employer] and genitals (private parts) with your normal soap and rinse thoroughly to remove soap. ? ?After that Use CHG Soap as you would any other liquid soap. You can apply CHG directly to the skin and wash gently with a scrungie or a clean washcloth.   ? ?Apply the CHG Soap to your body ONLY FROM THE NECK DOWN.  Do not use on open wounds or open sores. Avoid contact with your eyes, ears, mouth and genitals (private parts). Wash Face and genitals (private parts)  with your normal soap.  ? ?Wash thoroughly, paying special attention to the area where your surgery will be performed. ? ?Thoroughly rinse your body with warm water from the neck down. ? ?DO NOT shower/wash with your normal soap after using and rinsing off the CHG Soap. ? ?Pat yourself dry with a CLEAN TOWEL. ? ?Wear CLEAN PAJAMAS to bed the night before surgery ? ?Place CLEAN SHEETS on your bed the night before your surgery ? ?DO NOT SLEEP WITH PETS. ? ? ?Day of Surgery: ? ?Take a shower with CHG soap. ?Wear Clean/Comfortable clothing the morning of surgery ?Do not apply any deodorants/lotions.   ?Remember to brush your teeth WITH YOUR REGULAR TOOTHPASTE. ?  ?Please read over the following fact sheets that you were given.  ? ?

## 2021-09-14 ENCOUNTER — Ambulatory Visit
Admission: RE | Admit: 2021-09-14 | Discharge: 2021-09-14 | Disposition: A | Discharge status: Home / Self Care | Payer: Medicare PPO | Source: Ambulatory Visit

## 2021-09-14 ENCOUNTER — Other Ambulatory Visit: Payer: Self-pay

## 2021-09-14 ENCOUNTER — Encounter: Payer: Self-pay | Admitting: Internal Medicine

## 2021-09-14 ENCOUNTER — Encounter (HOSPITAL_COMMUNITY)
Admission: RE | Admit: 2021-09-14 | Discharge: 2021-09-14 | Disposition: A | Discharge status: Home / Self Care | Payer: Medicare PPO | Source: Ambulatory Visit | Attending: Neurological Surgery | Admitting: Neurological Surgery

## 2021-09-14 ENCOUNTER — Encounter (HOSPITAL_COMMUNITY): Payer: Self-pay

## 2021-09-14 VITALS — BP 127/88 | HR 81 | Temp 97.9°F | Resp 17 | Ht 64.0 in | Wt 148.7 lb

## 2021-09-14 DIAGNOSIS — Z1231 Encounter for screening mammogram for malignant neoplasm of breast: Secondary | ICD-10-CM | POA: Diagnosis not present

## 2021-09-14 DIAGNOSIS — G90A Postural orthostatic tachycardia syndrome (POTS): Secondary | ICD-10-CM | POA: Insufficient documentation

## 2021-09-14 DIAGNOSIS — I10 Essential (primary) hypertension: Secondary | ICD-10-CM | POA: Diagnosis not present

## 2021-09-14 DIAGNOSIS — Z01812 Encounter for preprocedural laboratory examination: Secondary | ICD-10-CM | POA: Insufficient documentation

## 2021-09-14 DIAGNOSIS — G901 Familial dysautonomia [Riley-Day]: Secondary | ICD-10-CM | POA: Insufficient documentation

## 2021-09-14 DIAGNOSIS — Z950 Presence of cardiac pacemaker: Secondary | ICD-10-CM | POA: Diagnosis not present

## 2021-09-14 DIAGNOSIS — M47812 Spondylosis without myelopathy or radiculopathy, cervical region: Secondary | ICD-10-CM | POA: Insufficient documentation

## 2021-09-14 DIAGNOSIS — F429 Obsessive-compulsive disorder, unspecified: Secondary | ICD-10-CM | POA: Diagnosis not present

## 2021-09-14 DIAGNOSIS — I442 Atrioventricular block, complete: Secondary | ICD-10-CM | POA: Insufficient documentation

## 2021-09-14 DIAGNOSIS — Z01818 Encounter for other preprocedural examination: Secondary | ICD-10-CM

## 2021-09-14 DIAGNOSIS — R600 Localized edema: Secondary | ICD-10-CM | POA: Insufficient documentation

## 2021-09-14 DIAGNOSIS — M4802 Spinal stenosis, cervical region: Secondary | ICD-10-CM | POA: Diagnosis not present

## 2021-09-14 DIAGNOSIS — I471 Supraventricular tachycardia: Secondary | ICD-10-CM | POA: Insufficient documentation

## 2021-09-14 LAB — BASIC METABOLIC PANEL
Anion gap: 5 (ref 5–15)
BUN: 13 mg/dL (ref 6–20)
CO2: 29 mmol/L (ref 22–32)
Calcium: 9.4 mg/dL (ref 8.9–10.3)
Chloride: 102 mmol/L (ref 98–111)
Creatinine, Ser: 0.77 mg/dL (ref 0.44–1.00)
GFR, Estimated: >60 mL/min (ref >60)
Glucose, Bld: 92 mg/dL (ref 70–99)
Potassium: 4.3 mmol/L (ref 3.5–5.1)
Sodium: 136 mmol/L (ref 135–145)

## 2021-09-14 LAB — CBC
HCT: 43 % (ref 36.0–46.0)
Hemoglobin: 14.8 g/dL (ref 12.0–15.0)
MCH: 31.6 pg (ref 26.0–34.0)
MCHC: 34.4 g/dL (ref 30.0–36.0)
MCV: 91.9 fL (ref 80.0–100.0)
Platelets: 235 10*3/uL (ref 150–400)
RBC: 4.68 MIL/uL (ref 3.87–5.11)
RDW: 11.7 % (ref 11.5–15.5)
WBC: 7.3 10*3/uL (ref 4.0–10.5)
nRBC: 0 % (ref 0.0–0.2)

## 2021-09-14 LAB — TYPE AND SCREEN
ABO/RH(D): A POS
Antibody Screen: NEGATIVE

## 2021-09-14 LAB — SURGICAL PCR SCREEN
MRSA, PCR: NEGATIVE
Staphylococcus aureus: NEGATIVE

## 2021-09-14 IMAGING — MG MM DIGITAL SCREENING BILAT W/ TOMO AND CAD
8 series · 9 of 24 positions shown · non-contrast
Comparison: Previous exam(s).

CLINICAL DATA: Screening.

EXAM:
DIGITAL SCREENING BILATERAL MAMMOGRAM WITH TOMOSYNTHESIS AND CAD
TECHNIQUE: Bilateral screening digital craniocaudal and mediolateral oblique
mammograms were obtained. Bilateral screening digital breast
tomosynthesis was performed. The images were evaluated with
computer-aided detection.

[R MLO synth-2D]
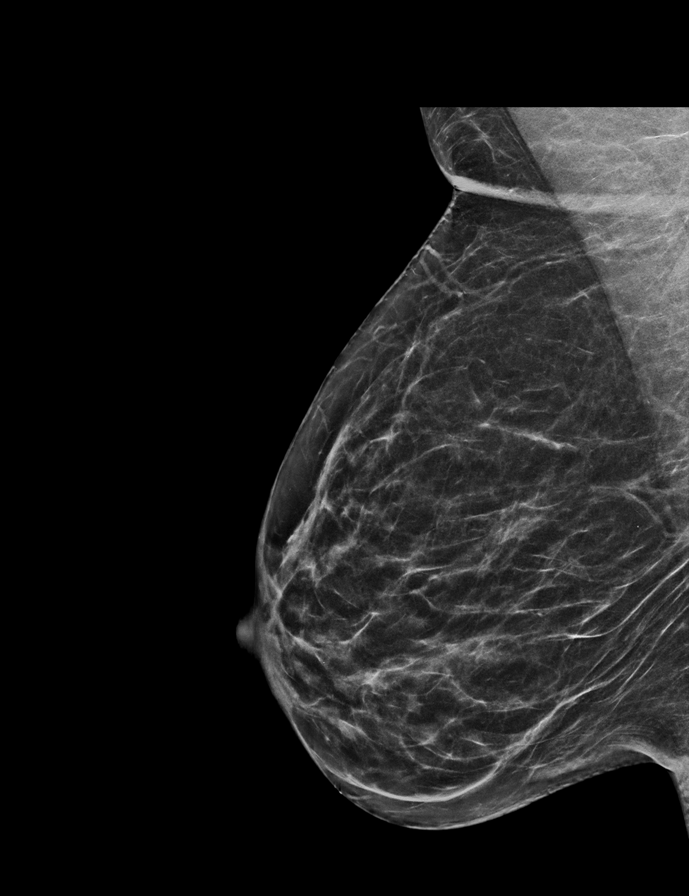

[L CC synth-2D]
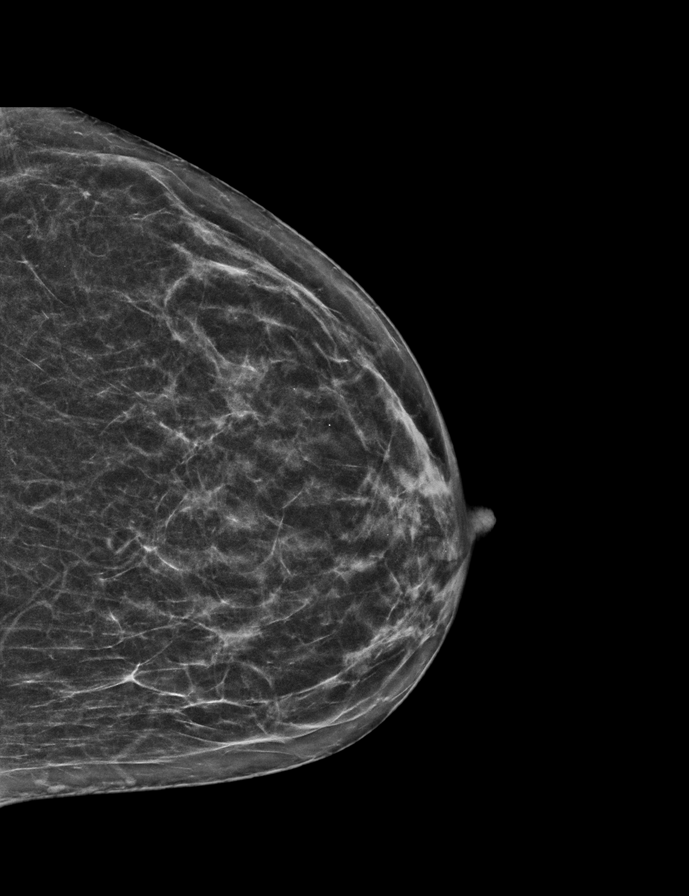

[L MLO synth-2D]
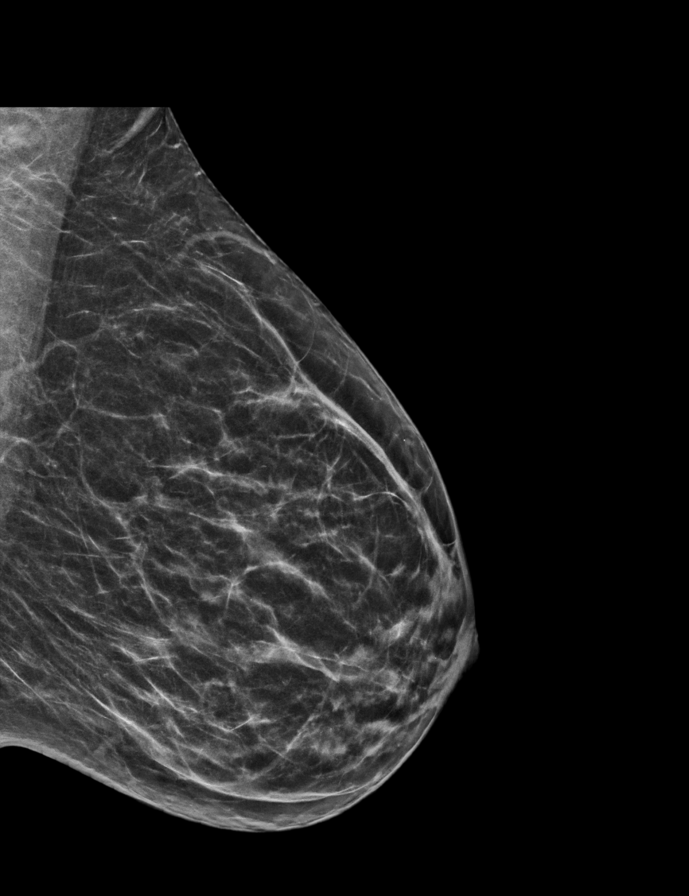

[R CC synth-2D]
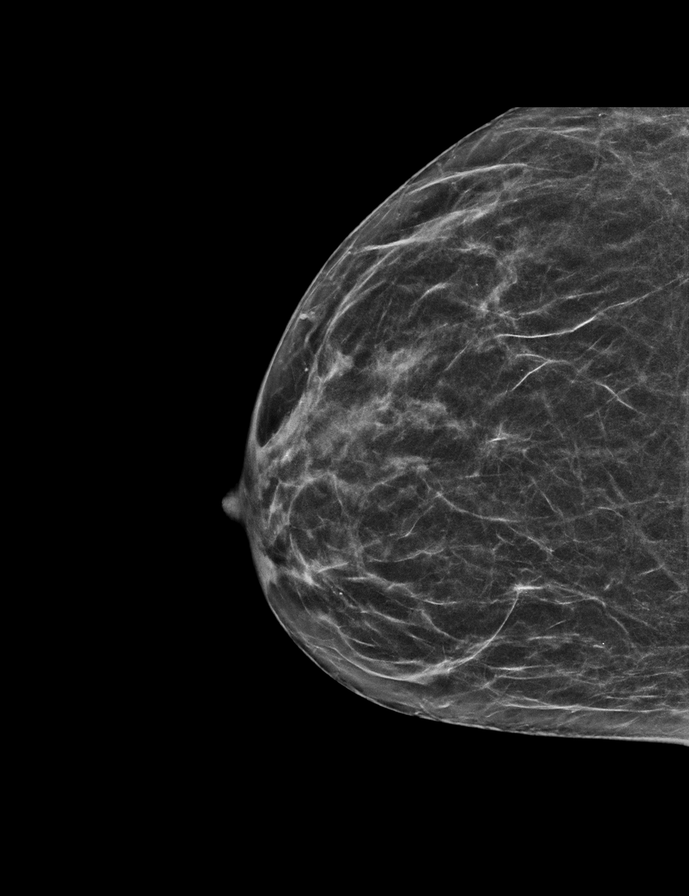

[L MLO tomo · 2 of 53 frames shown]
[frame 18/53]
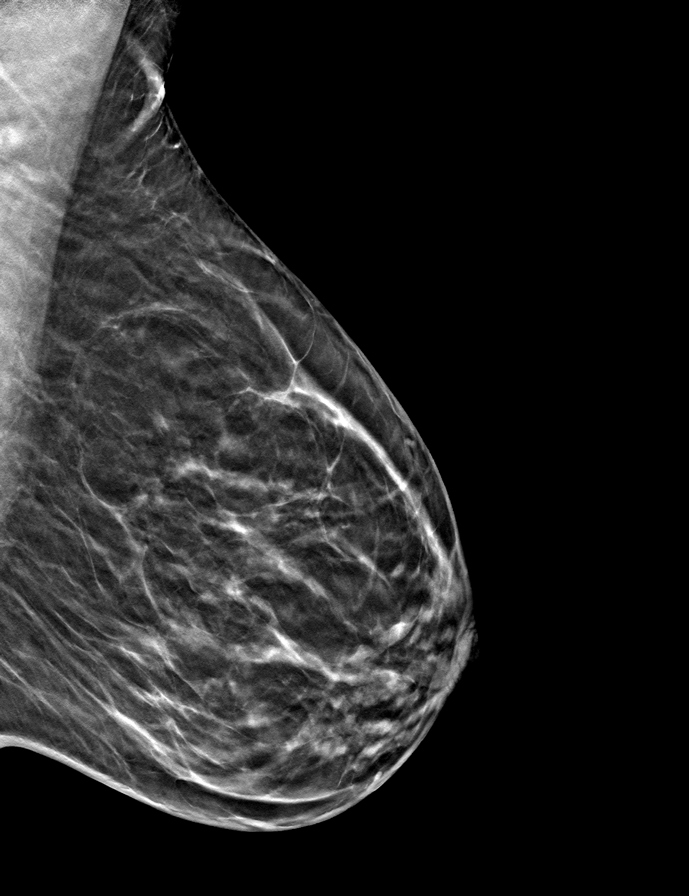
[frame 27/53]
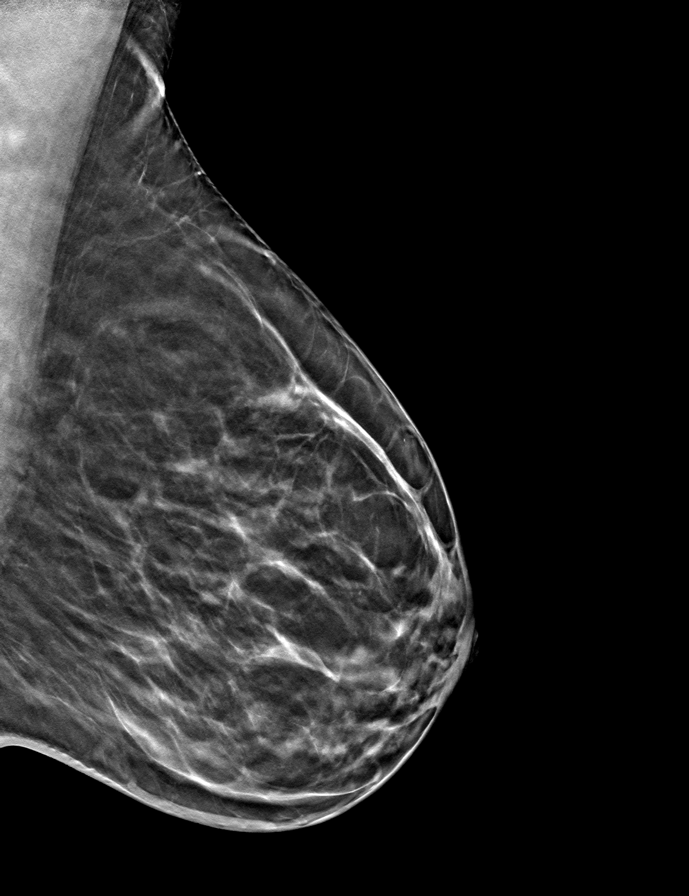

[R MLO tomo · tomo slice 28/55.0]
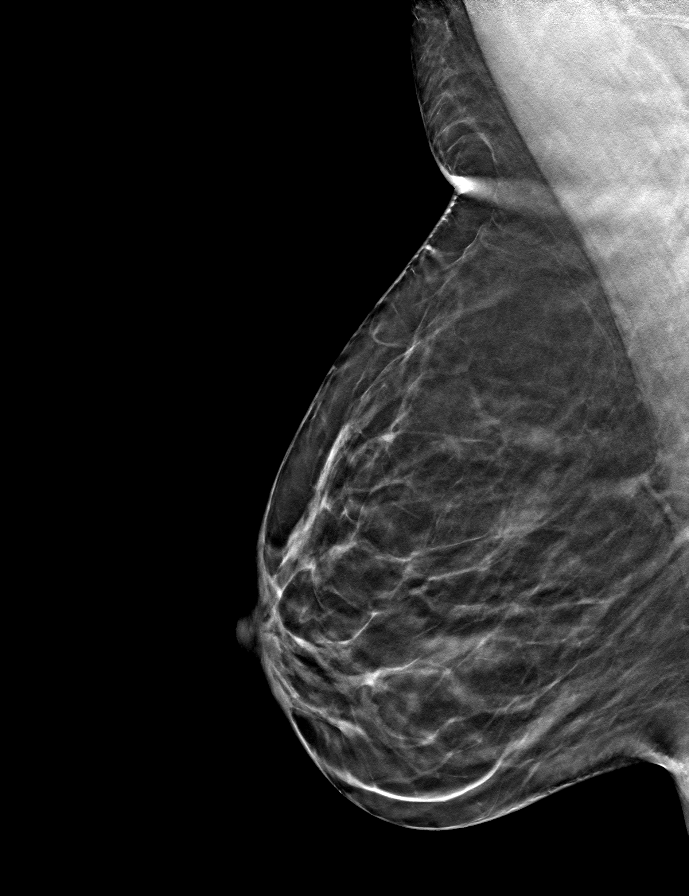

[L CC tomo · tomo slice 25/50.0]
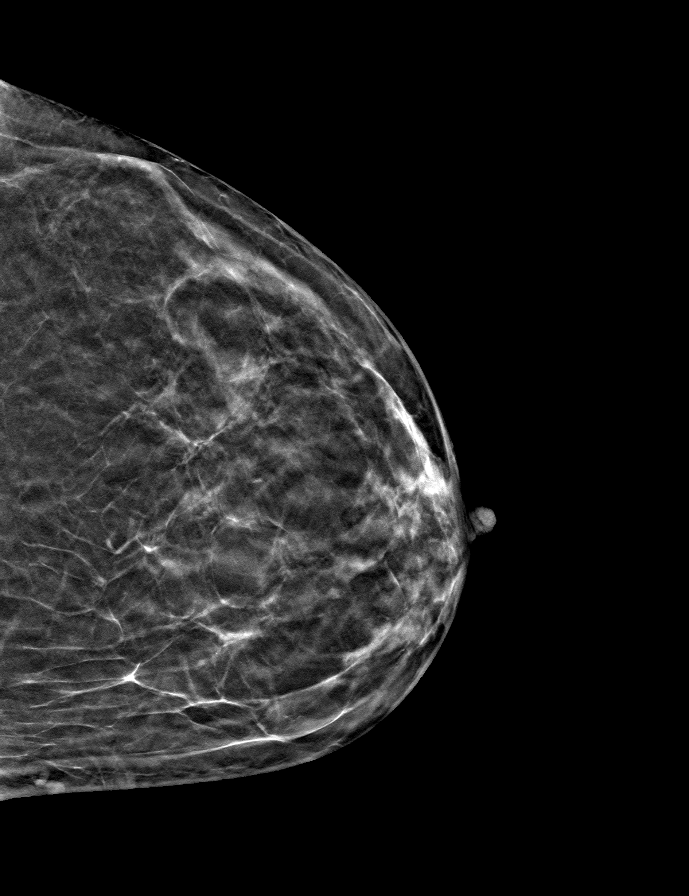

[R CC tomo · tomo slice 25/49.0]
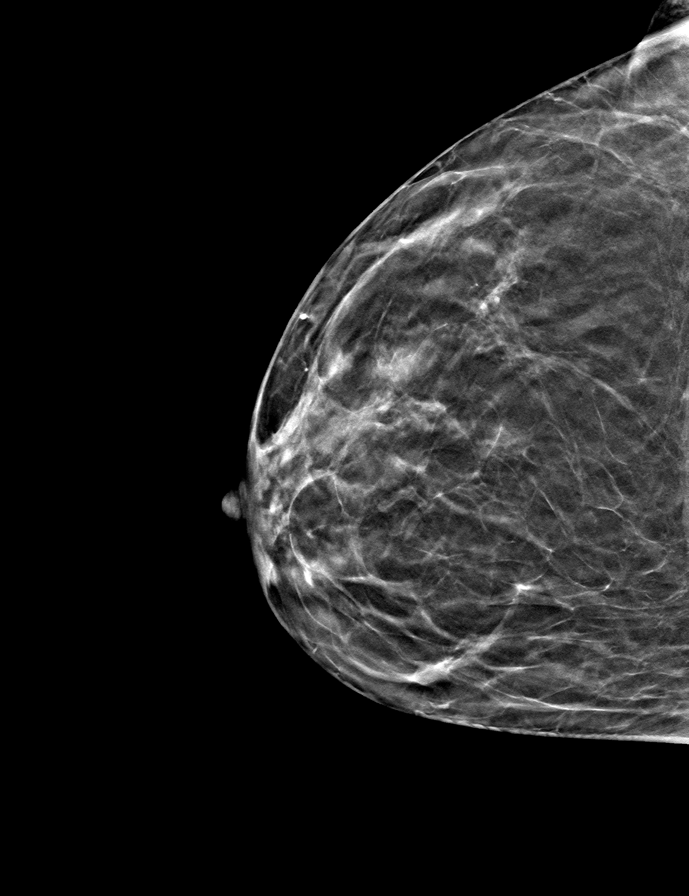

[9 of 24 positions shown; findings below may reference images not displayed]

ACR Breast Density Category b: There are scattered areas of
fibroglandular density.
FINDINGS: There are no findings suspicious for malignancy.
IMPRESSION: No mammographic evidence of malignancy. A result letter of this
screening mammogram will be mailed directly to the patient.

RECOMMENDATION:
Screening mammogram in one year. (Code:[BY])

BI-RADS CATEGORY  1: Negative.

## 2021-09-14 NOTE — Progress Notes (Signed)
Cardiac Device clinic and [employer] notified.  Lindsi Forte copied.  ?

## 2021-09-14 NOTE — Progress Notes (Signed)
PCP:  Park Liter, DO ?Cardiologist:  Virl Axe, MD ? ?EKG:  03/02/21 ?CXR:  na ?ECHO:  03/03/22 ?Stress Test: denies ?Cardiac Cath:  2010 ? ?Fasting Blood Sugar-  na ?Checks Blood Sugar  na ? ?OSA/CPAP: No ? ?ASA: Last dose 09/12/21 ?Blood Thinner: No ? ?Covid test not needed ? ?Anesthesia Review: yes, cardiac history.  Device clinic and [employer] notified of surgery. ? ?Patient denies shortness of breath, fever, cough, and chest pain at PAT appointment. ? ?Patient verbalized understanding of instructions provided today at the PAT appointment.  Patient asked to review instructions at home and day of surgery.   ?

## 2021-09-14 NOTE — Progress Notes (Signed)
PERIOPERATIVE PRESCRIPTION FOR IMPLANTED CARDIAC DEVICE PROGRAMMING ? ?Patient Information: ?Name:  [name]  ?DOB:  04-27-1971  ?MRN:  [id number]  ?  ?Patient: Angela [name], DOB 2071-03-24  ?Planned Procedure:  Anterior cervical decompression/disectomy fusion  ?Surgeon:  Dr. Pieter Partridge Dawley  ?Date of Procedure:  09/20/21  ?Cautery will be used.  ?Position during surgery:    ? ?Please send documentation back to:  ?[facility] (Fax # [phone])  ?Device Information: ? ?Clinic EP Physician:  Virl Axe, MD  ? ?Device Type:  Pacemaker ?Manufacturer and Phone #:  [employer]: [phone] ?Pacemaker Dependent?:  Yes.   ?Date of Last Device Check:  08/20/21 Normal Device Function?:  Yes.   ? ?Electrophysiologist's Recommendations: ? ?Have magnet available. ?Provide continuous ECG monitoring when magnet is used or reprogramming is to be performed.  ?Procedure may interfere with device function.  Magnet should be placed over device during procedure. ? ?Per Device Clinic Standing Orders, ?Simone Curia, RN  ?12:49 PM 09/14/2021  ?

## 2021-09-17 NOTE — Progress Notes (Signed)
Anesthesia Chart Review: ? Case: [id number] Date/Time: 09/20/21 0946  ? Procedure: ACDF C45, C56, C67 - 3C  ? Anesthesia type: General  ? Pre-op diagnosis: NEURAL FORAMINAL STENOSIS OF CERVICAL SPINE  ? Location: [facility] ROOM [facility] / [facility]  ? Surgeons: Karsten Ro, DO  ? ?  ? ? ?DISCUSSION: Patient is a 51 year old female scheduled for the above procedure. ? 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  ? ?Last visit with EP Dr. Caryl Comes on 03/02/21.  No significant atrial arrhythmias.  Given concern for Raynaud's he would try to wean her off beta-blocker therapy.  She has had some issues with atrial tachycardia as well as elevated blood pressure, and if these progress he will try low-dose calcium channel blocker instead. ? ?Preoperative PPM Rx per EP: ?Device Information: ?Clinic EP Physician:  Virl Axe, MD  ?Device Type:  Pacemaker ?Manufacturer and Phone #:  [employer]: [phone] ?Pacemaker Dependent?:  Yes.   ?Date of Last Device Check:  08/20/21           Normal Device Function?:  Yes.   ?  ?Electrophysiologist's Recommendations: ?Have magnet available. ?Provide continuous ECG monitoring when magnet is used or reprogramming is to be performed.  ?Procedure may interfere with device function.  Magnet should be placed over device during procedure. ? ? ?Reported last ASA 09/12/21. Anesthesia team to evaluate on the day of surgery.  ? ? ?VS: BP 127/88   Pulse 81   Temp 36.6 ?C (Oral)   Resp 17   Ht '5\' 4"'$  (1.626 m)   Wt 67.4 kg   SpO2 100%   BMI 25.52 kg/m?  ? ? ?PROVIDERS: ?Valerie Roys, DO is PCP  ?- Virl Axe, MD is EP cardiologist ?- Leotis Pain, MD is vascular surgeon. Evaluated 06/30/20 for "blue toes". Normal triphasic ABI's > 1 with somewhat dampened digital tracings felt more suggestive  of some component of vasospasm like Raynaud's disease rather than atheroembolic disease. Daily ASA thought to be a "reasonable option" with compression socks, staying active, elevating legs when possible, and avoiding cold stimulation recommended.  As needed vascular surgery follow-up recommended. ? ? ?LABS: Labs reviewed: Acceptable for surgery. ?(all labs ordered are listed, but only abnormal results are displayed) ? ?Labs Reviewed  ?SURGICAL PCR SCREEN  ?CBC  ?BASIC METABOLIC PANEL  ?TYPE AND SCREEN  ? ? ? ?IMAGES: ?MRI C-spine 08/20/21: ?IMPRESSION: ?1. Degenerative changes in the cervical spine as detailed above have ?overall slightly progressed since 2021. Findings are most advanced ?at C3-C4 and C4-C5 where there is moderate left neural foraminal ?stenosis, and C5-C6 where there is severe right and mild left neural ?foraminal stenosis. No more than mild spinal canal stenosis ?throughout the cervical spine. ?2. Multilevel facet arthropathy. There is mild reactive marrow edema ?in the posterior elements on the left at C4-C5 and on the right at ?C5-C6 with associated perifacetal soft tissue edema and a small ?effusion at C5-C6. These findings may reflect a source of pain. ?  ? ?EKG: 03/02/21 ([facility]): Some ventricular pacing spikes noted. Regular rhythm. Negative anterolateral and inferior T waves. Ventricular rate 88 bpm. ? ? ?CV: ?Echo 03/03/20: ?IMPRESSIONS  ? 1. Left ventricular ejection fraction, by estimation, is 55 to 60%. The  ?left ventricle has normal function. The left ventricle has no regional  ?wall motion  abnormalities. Left ventricular diastolic parameters are  ?indeterminate.  ? 2. Right ventricular systolic function is normal. The right ventricular  ?size is normal. There is normal pulmonary artery systolic pressure.  ? 3. Right atrial size was mildly dilated.  ? 4. The mitral valve is normal in structure. Trivial mitral valve  ?regurgitation.  ? 5. The tricuspid valve is abnormal.  Tricuspid valve regurgitation is  ?moderate.  ? 6. The aortic valve is tricuspid. Aortic valve regurgitation is not  ?visualized. No aortic stenosis is present.  ? 7. The inferior vena cava is normal in size with greater than 50%  ?respiratory variability, suggesting right atrial pressure of 3 mmHg.  ?- Comparison(s): No significant change from prior study.  ? ?Cardiac cath 07/29/08: ? ASSESSMENT: ? 1. Normal coronary arteries. ? 2. Normal left ventricular function. ?  ? ?Past Medical History:  ?Diagnosis Date  ? Arthritis 2016  ? osteo in hands, knees, neck hips  ? Chest pain   ? a cath 2/10. EF 50-55% normal coronaries  ? Complete heart block ([place])   ? Depression   ? Dysautonomia ([place])   ? Dyspnea   ? CT negative for PE, 2007  ? Hypertension   ? Insomnia   ? OCD (obsessive compulsive disorder)   ? SVT (supraventricular tachycardia) ([place])   ? s/p ablation a. c/b AV nod ablation requiring pacemaker  ? ? ?Past Surgical History:  ?Procedure Laterality Date  ? BREAST BIOPSY Right 1998  ? BREAST BIOPSY Right 2004  ? BREAST LUMPECTOMY    ? RIGHT -BREAST   ? COLONOSCOPY WITH PROPOFOL N/A 09/07/2019  ? Procedure: COLONOSCOPY WITH PROPOFOL;  Surgeon: Lucilla Lame, MD;  Location: [facility] ENDOSCOPY;  Service: Endoscopy;  Laterality: N/A;  ? ENDOMETRIAL ABLATION  2005  ? INSERT / REPLACE / REMOVE PACEMAKER    ? PELVIC LAPAROSCOPY  1990  ? PERMANENT PACEMAKER GENERATOR CHANGE N/A 07/13/2014  ? MDT MRI compatible dual chamber pacemaker implanted by Dr Caryl Comes  ? SPARK  2005  ? TUBAL LIGATION  2003  ? ? ?MEDICATIONS: ? aspirin EC 81 MG tablet  ? celecoxib (CELEBREX) 100 MG capsule  ? cyclobenzaprine (FLEXERIL) 5 MG tablet  ? diclofenac Sodium (VOLTAREN) 1 % GEL  ? FIBER ADULT GUMMIES PO  ? LORazepam (ATIVAN) 0.5 MG tablet  ? Multiple Vitamins-Minerals (MULTIVITAMIN WITH MINERALS) tablet  ? traMADol (ULTRAM) 50 MG tablet  ? venlafaxine XR (EFFEXOR XR) 75 MG 24 hr capsule  ? venlafaxine XR (EFFEXOR-XR) 150 MG 24 hr capsule  ? ?No current  facility-administered medications for this encounter.  ? ? ?Myra Gianotti, PA-C ?Surgical Short Stay/Anesthesiology ?[facility] Phone [phone] ?[facility] Phone [phone] ?09/17/2021 3:11 PM ? ? ? ? ? ? ?

## 2021-09-17 NOTE — Anesthesia Preprocedure Evaluation (Addendum)
Anesthesia Evaluation  ?Patient identified by MRN, date of birth, ID band ?Patient awake ? ? ? ?Reviewed: ?Allergy & Precautions, NPO status , Patient's Chart, lab work & pertinent test results ? ?History of Anesthesia Complications ?Negative for: history of anesthetic complications ? ?Airway ?Mallampati: II ? ?TM Distance: >3 FB ?Neck ROM: Full ? ? ? Dental ? ?(+) Teeth Intact, Dental Advisory Given ?  ?Pulmonary ?neg pulmonary ROS,  ?  ?Pulmonary exam normal ? ? ? ? ? ? ? Cardiovascular ?hypertension, Normal cardiovascular exam+ dysrhythmias (SVT s/p ablation, now with CHB) Supra Ventricular Tachycardia + pacemaker  ? ? ?Echo 2021: EF 55-60%, no RWMA, normal RVSF/PASP, mod TR ? ?Normal coronary arteries by 2010 cath ?  ?Neuro/Psych ?Anxiety Depression negative neurological ROS ?   ? GI/Hepatic ?negative GI ROS, Neg liver ROS,   ?Endo/Other  ?negative endocrine ROS ? Renal/GU ?negative Renal ROS  ?negative genitourinary ?  ?Musculoskeletal ?negative musculoskeletal ROS ?(+)  ? Abdominal ?  ?Peds ? Hematology ?negative hematology ROS ?(+)   ?Anesthesia Other Findings ? ? Reproductive/Obstetrics ? ?  ? ? ? ? ? ? ? ? ? ? ? ? ? ?  ?  ? ? ? ? ? ? ?Anesthesia Physical ?Anesthesia Plan ? ?ASA: 3 ? ?Anesthesia Plan: General  ? ?Post-op Pain Management: Tylenol PO (pre-op)*, Toradol IV (intra-op)* and Ketamine IV*  ? ?Induction: Intravenous ? ?PONV Risk Score and Plan: 3 and Ondansetron, Dexamethasone, Treatment may vary due to age or medical condition and Midazolam ? ?Airway Management Planned: Oral ETT ? ?Additional Equipment: None ? ?Intra-op Plan:  ? ?Post-operative Plan: Extubation in OR ? ?Informed Consent: I have reviewed the patients History and Physical, chart, labs and discussed the procedure including the risks, benefits and alternatives for the proposed anesthesia with the patient or authorized representative who has indicated his/her understanding and acceptance.  ? ? ? ?Dental  advisory given ? ?Plan Discussed with:  ? ?Anesthesia Plan Comments: (Plan to use magnet intraoperatively for pacemaker.)  ? ? ? ? ?Anesthesia Quick Evaluation ? ?

## 2021-09-20 ENCOUNTER — Inpatient Hospital Stay (HOSPITAL_COMMUNITY): Payer: Medicare PPO | Admitting: Vascular Surgery

## 2021-09-20 ENCOUNTER — Inpatient Hospital Stay (HOSPITAL_COMMUNITY)
Admission: RE | Admit: 2021-09-20 | Discharge: 2021-09-21 | DRG: 472 | Disposition: A | Discharge status: Home / Self Care | Payer: Medicare PPO | Attending: Neurological Surgery | Admitting: Neurological Surgery

## 2021-09-20 ENCOUNTER — Encounter (HOSPITAL_COMMUNITY)
Admission: RE | Disposition: A | Discharge status: Home / Self Care | Payer: Self-pay | Source: Home / Self Care | Attending: Neurological Surgery

## 2021-09-20 ENCOUNTER — Inpatient Hospital Stay (HOSPITAL_COMMUNITY): Payer: Medicare PPO | Admitting: Anesthesiology

## 2021-09-20 ENCOUNTER — Inpatient Hospital Stay (HOSPITAL_COMMUNITY): Payer: Medicare PPO

## 2021-09-20 ENCOUNTER — Encounter (HOSPITAL_COMMUNITY): Payer: Self-pay | Admitting: Neurological Surgery

## 2021-09-20 ENCOUNTER — Other Ambulatory Visit: Payer: Self-pay

## 2021-09-20 DIAGNOSIS — I10 Essential (primary) hypertension: Secondary | ICD-10-CM | POA: Diagnosis present

## 2021-09-20 DIAGNOSIS — Z8261 Family history of arthritis: Secondary | ICD-10-CM

## 2021-09-20 DIAGNOSIS — M170 Bilateral primary osteoarthritis of knee: Secondary | ICD-10-CM | POA: Diagnosis present

## 2021-09-20 DIAGNOSIS — Z818 Family history of other mental and behavioral disorders: Secondary | ICD-10-CM

## 2021-09-20 DIAGNOSIS — Z79899 Other long term (current) drug therapy: Secondary | ICD-10-CM | POA: Diagnosis not present

## 2021-09-20 DIAGNOSIS — Z8249 Family history of ischemic heart disease and other diseases of the circulatory system: Secondary | ICD-10-CM | POA: Diagnosis not present

## 2021-09-20 DIAGNOSIS — M160 Bilateral primary osteoarthritis of hip: Secondary | ICD-10-CM | POA: Diagnosis present

## 2021-09-20 DIAGNOSIS — F32A Depression, unspecified: Secondary | ICD-10-CM | POA: Diagnosis not present

## 2021-09-20 DIAGNOSIS — M4722 Other spondylosis with radiculopathy, cervical region: Secondary | ICD-10-CM

## 2021-09-20 DIAGNOSIS — M2578 Osteophyte, vertebrae: Secondary | ICD-10-CM | POA: Diagnosis present

## 2021-09-20 DIAGNOSIS — M5412 Radiculopathy, cervical region: Secondary | ICD-10-CM | POA: Diagnosis present

## 2021-09-20 DIAGNOSIS — M4802 Spinal stenosis, cervical region: Secondary | ICD-10-CM | POA: Diagnosis present

## 2021-09-20 DIAGNOSIS — I442 Atrioventricular block, complete: Secondary | ICD-10-CM

## 2021-09-20 DIAGNOSIS — M19041 Primary osteoarthritis, right hand: Secondary | ICD-10-CM | POA: Diagnosis present

## 2021-09-20 DIAGNOSIS — Z9851 Tubal ligation status: Secondary | ICD-10-CM | POA: Diagnosis not present

## 2021-09-20 DIAGNOSIS — F429 Obsessive-compulsive disorder, unspecified: Secondary | ICD-10-CM | POA: Diagnosis not present

## 2021-09-20 DIAGNOSIS — Z7982 Long term (current) use of aspirin: Secondary | ICD-10-CM

## 2021-09-20 DIAGNOSIS — M19042 Primary osteoarthritis, left hand: Secondary | ICD-10-CM | POA: Diagnosis not present

## 2021-09-20 DIAGNOSIS — F418 Other specified anxiety disorders: Secondary | ICD-10-CM

## 2021-09-20 DIAGNOSIS — Z950 Presence of cardiac pacemaker: Secondary | ICD-10-CM

## 2021-09-20 DIAGNOSIS — Z888 Allergy status to other drugs, medicaments and biological substances status: Secondary | ICD-10-CM | POA: Diagnosis not present

## 2021-09-20 DIAGNOSIS — I471 Supraventricular tachycardia: Secondary | ICD-10-CM | POA: Diagnosis not present

## 2021-09-20 HISTORY — PX: ANTERIOR CERVICAL DECOMP/DISCECTOMY FUSION: SHX1161

## 2021-09-20 HISTORY — DX: Radiculopathy, cervical region: M54.12

## 2021-09-20 LAB — POCT PREGNANCY, URINE: Preg Test, Ur: NEGATIVE

## 2021-09-20 LAB — ABO/RH: ABO/RH(D): A POS

## 2021-09-20 IMAGING — RF DG CERVICAL SPINE 1V
1 series · 3 of 3 positions shown · non-contrast
Comparison: MRI cervical spine [DATE]

CLINICAL DATA: C4 through C7 ACDF surgery.  Fluoroscopy.

EXAM:
DG CERVICAL SPINE - 1 VIEW

[Series 1: run · 3 of 3 slices shown]
[im 1/3]
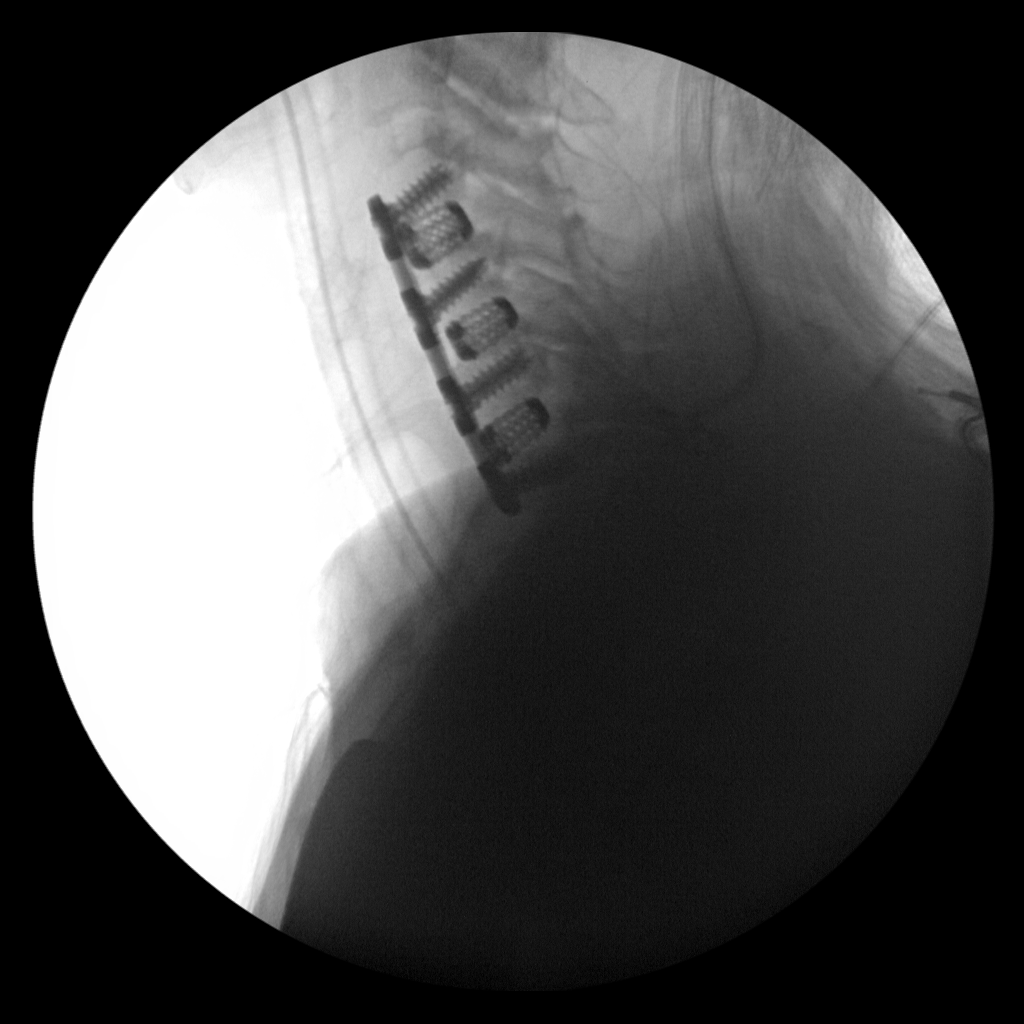
[im 2/3]
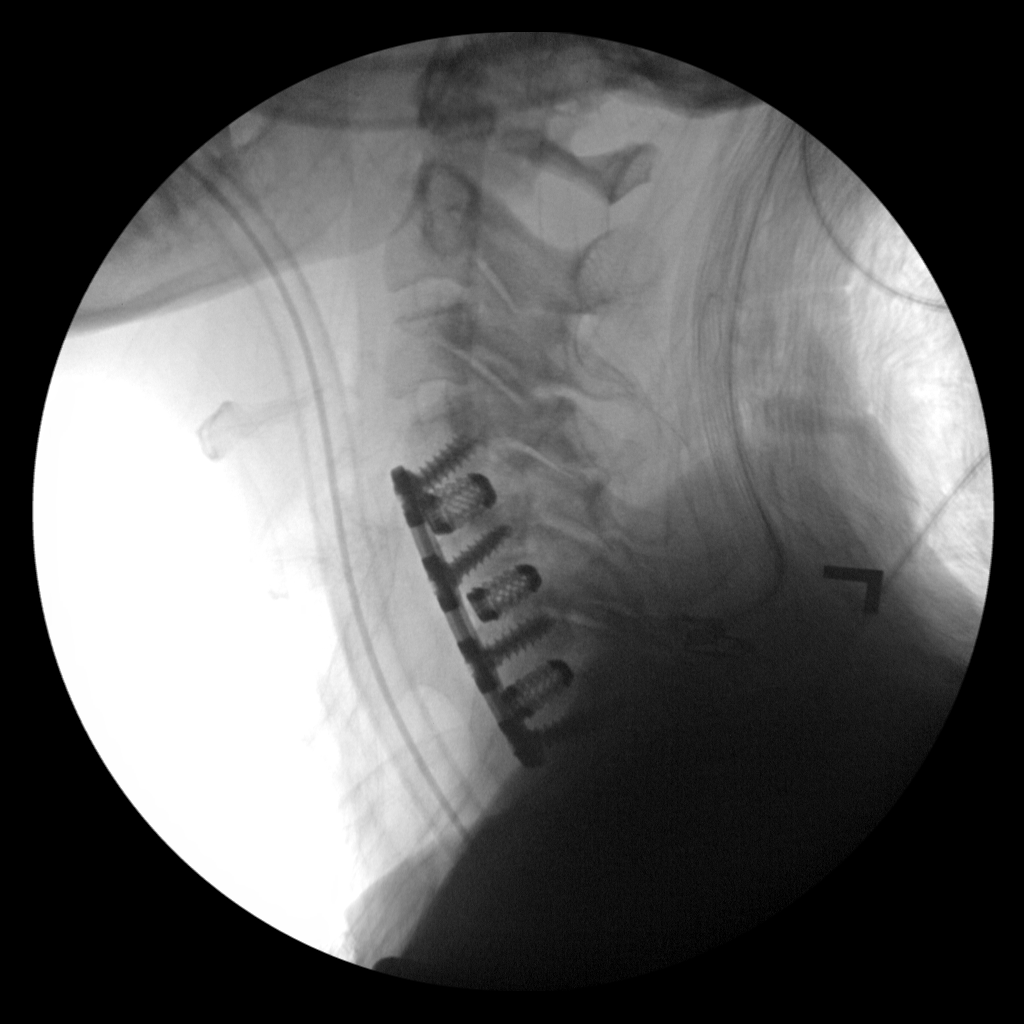
[im 3/3]
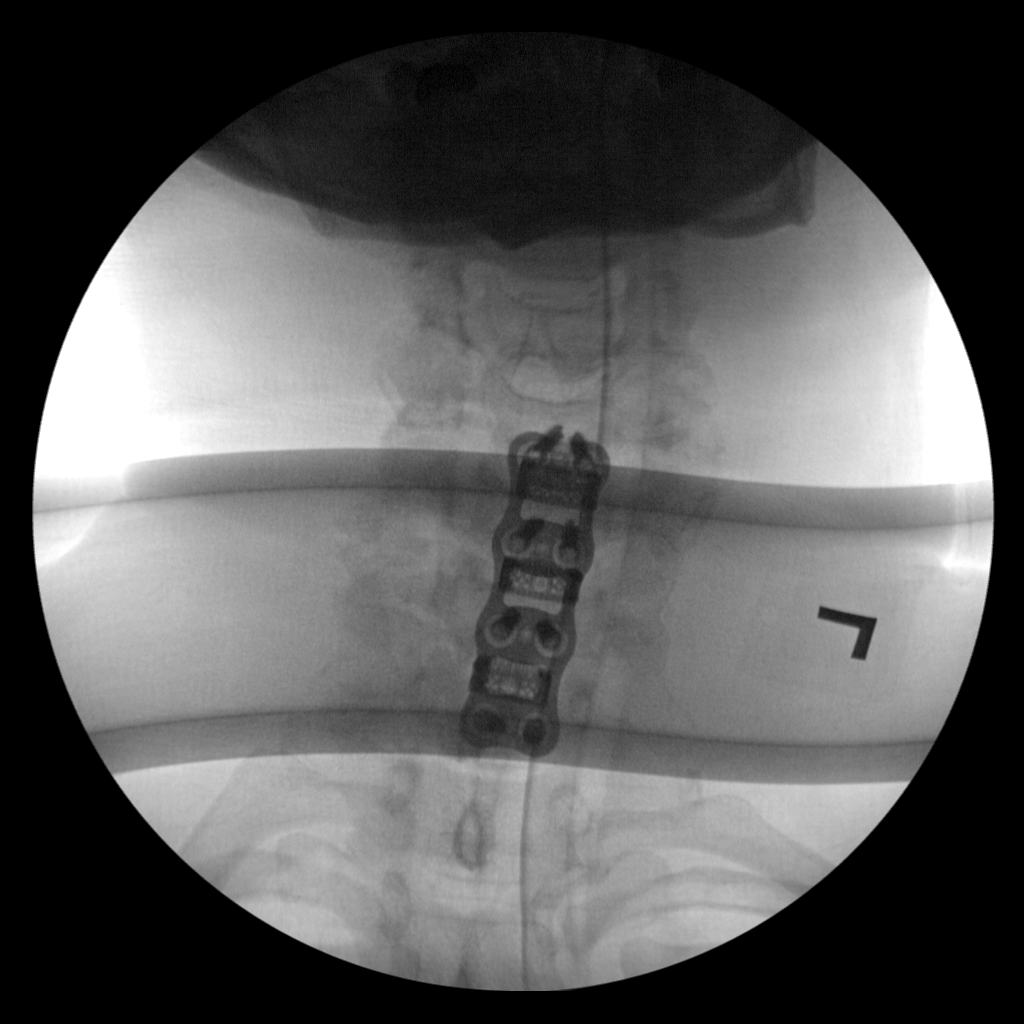

[3 of 3 positions shown; findings below may reference images not displayed]

FINDINGS: Images were performed intraoperatively without the presence of a
radiologist. The patient is undergoing C4 through C7 ACDF with
anterior plate and screws and associated intervertebral disc
spacers.

Total fluoroscopy images: 3

Total fluoroscopy time: 12 seconds

Total dose: Radiation Exposure Index (as provided by the
fluoroscopic device): 1.19 mGy air Kerma

Please see intraoperative findings for further detail.
IMPRESSION: Fluoroscopy for C4 through C7 ACDF.

## 2021-09-20 SURGERY — ANTERIOR CERVICAL DECOMPRESSION/DISCECTOMY FUSION 3 LEVELS
Anesthesia: General

## 2021-09-20 MED ORDER — DEXAMETHASONE SODIUM PHOSPHATE 10 MG/ML IJ SOLN
INTRAMUSCULAR | Status: DC | PRN
Start: 2021-09-20 — End: 2021-09-20
  Administered 2021-09-20: 10 mg via INTRAVENOUS

## 2021-09-20 MED ORDER — PROPOFOL 10 MG/ML IV BOLUS
INTRAVENOUS | Status: AC
  Filled 2021-09-20: qty 20

## 2021-09-20 MED ORDER — HYDROCODONE-ACETAMINOPHEN 10-325 MG PO TABS
2.0000 | ORAL_TABLET | ORAL | Status: DC | PRN
  Administered 2021-09-20 – 2021-09-21 (×3): 2 via ORAL
  Filled 2021-09-20 (×3): qty 2

## 2021-09-20 MED ORDER — ROCURONIUM BROMIDE 10 MG/ML (PF) SYRINGE
PREFILLED_SYRINGE | INTRAVENOUS | Status: AC
  Filled 2021-09-20: qty 10

## 2021-09-20 MED ORDER — KETAMINE HCL 50 MG/5ML IJ SOSY
PREFILLED_SYRINGE | INTRAMUSCULAR | Status: AC
  Filled 2021-09-20: qty 5

## 2021-09-20 MED ORDER — LIDOCAINE 2% (20 MG/ML) 5 ML SYRINGE
INTRAMUSCULAR | Status: AC
  Filled 2021-09-20: qty 5

## 2021-09-20 MED ORDER — HYDROCODONE-ACETAMINOPHEN 10-325 MG PO TABS
1.0000 | ORAL_TABLET | ORAL | Status: DC | PRN
  Administered 2021-09-20: 1 via ORAL
  Administered 2021-09-21: 2 via ORAL
  Filled 2021-09-20 (×3): qty 1

## 2021-09-20 MED ORDER — KETOROLAC TROMETHAMINE 15 MG/ML IJ SOLN
15.0000 mg | Freq: Four times a day (QID) | INTRAMUSCULAR | Status: DC
  Administered 2021-09-20 – 2021-09-21 (×3): 15 mg via INTRAVENOUS
  Filled 2021-09-20 (×3): qty 1

## 2021-09-20 MED ORDER — PHENOL 1.4 % MT LIQD: 1.0000 | OROMUCOSAL | Status: DC | PRN

## 2021-09-20 MED ORDER — ONDANSETRON HCL 4 MG/2ML IJ SOLN
INTRAMUSCULAR | Status: DC | PRN
  Administered 2021-09-20: 4 mg via INTRAVENOUS

## 2021-09-20 MED ORDER — SODIUM CHLORIDE 0.9 % IV SOLN
250.0000 mL | INTRAVENOUS | Status: DC
  Administered 2021-09-20: 250 mL via INTRAVENOUS

## 2021-09-20 MED ORDER — MIDAZOLAM HCL 5 MG/5ML IJ SOLN
INTRAMUSCULAR | Status: DC | PRN
  Administered 2021-09-20 (×2): 1 mg via INTRAVENOUS

## 2021-09-20 MED ORDER — ACETAMINOPHEN 325 MG PO TABS: 650.0000 mg | ORAL_TABLET | ORAL | Status: DC | PRN

## 2021-09-20 MED ORDER — AMISULPRIDE (ANTIEMETIC) 5 MG/2ML IV SOLN: 10.0000 mg | Freq: Once | INTRAVENOUS | Status: DC | PRN

## 2021-09-20 MED ORDER — MENTHOL 3 MG MT LOZG
1.0000 | LOZENGE | OROMUCOSAL | Status: DC | PRN
  Administered 2021-09-20: 3 mg via ORAL
  Filled 2021-09-20: qty 9

## 2021-09-20 MED ORDER — PROPOFOL 10 MG/ML IV BOLUS
INTRAVENOUS | Status: DC | PRN
  Administered 2021-09-20: 150 mg via INTRAVENOUS

## 2021-09-20 MED ORDER — ONDANSETRON HCL 4 MG/2ML IJ SOLN
INTRAMUSCULAR | Status: AC
  Filled 2021-09-20: qty 2

## 2021-09-20 MED ORDER — METHOCARBAMOL 500 MG PO TABS
500.0000 mg | ORAL_TABLET | Freq: Four times a day (QID) | ORAL | Status: DC | PRN
  Administered 2021-09-20 – 2021-09-21 (×3): 500 mg via ORAL
  Filled 2021-09-20 (×3): qty 1

## 2021-09-20 MED ORDER — DEXAMETHASONE SODIUM PHOSPHATE 10 MG/ML IJ SOLN
INTRAMUSCULAR | Status: AC
  Filled 2021-09-20: qty 1

## 2021-09-20 MED ORDER — CEFAZOLIN SODIUM-DEXTROSE 2-4 GM/100ML-% IV SOLN
2.0000 g | INTRAVENOUS | Status: AC
  Administered 2021-09-20: 2 g via INTRAVENOUS
  Filled 2021-09-20: qty 100

## 2021-09-20 MED ORDER — PHENYLEPHRINE 80 MCG/ML (10ML) SYRINGE FOR IV PUSH (FOR BLOOD PRESSURE SUPPORT)
PREFILLED_SYRINGE | INTRAVENOUS | Status: AC
  Filled 2021-09-20: qty 10

## 2021-09-20 MED ORDER — ONDANSETRON HCL 4 MG/2ML IJ SOLN: 4.0000 mg | Freq: Four times a day (QID) | INTRAMUSCULAR | Status: DC | PRN

## 2021-09-20 MED ORDER — VENLAFAXINE HCL ER 75 MG PO CP24: 150.0000 mg | ORAL_CAPSULE | Freq: Every day | ORAL | Status: DC

## 2021-09-20 MED ORDER — CHLORHEXIDINE GLUCONATE 0.12 % MT SOLN
OROMUCOSAL | Status: AC
  Administered 2021-09-20: 15 mL
  Filled 2021-09-20: qty 15

## 2021-09-20 MED ORDER — 0.9 % SODIUM CHLORIDE (POUR BTL) OPTIME
TOPICAL | Status: DC | PRN
  Administered 2021-09-20: 1000 mL

## 2021-09-20 MED ORDER — FENTANYL CITRATE (PF) 250 MCG/5ML IJ SOLN
INTRAMUSCULAR | Status: DC | PRN
  Administered 2021-09-20 (×5): 50 ug via INTRAVENOUS

## 2021-09-20 MED ORDER — MIDAZOLAM HCL 2 MG/2ML IJ SOLN
INTRAMUSCULAR | Status: AC
  Filled 2021-09-20: qty 2

## 2021-09-20 MED ORDER — ACETAMINOPHEN 500 MG PO TABS
1000.0000 mg | ORAL_TABLET | Freq: Once | ORAL | Status: AC
  Administered 2021-09-20: 1000 mg via ORAL
  Filled 2021-09-20: qty 2

## 2021-09-20 MED ORDER — LORAZEPAM 0.5 MG PO TABS: 0.5000 mg | ORAL_TABLET | Freq: Two times a day (BID) | ORAL | Status: DC | PRN

## 2021-09-20 MED ORDER — THROMBIN 5000 UNITS EX SOLR
CUTANEOUS | Status: AC
  Filled 2021-09-20: qty 15000

## 2021-09-20 MED ORDER — TRIAMCINOLONE ACETONIDE 40 MG/ML IJ SUSP
INTRAMUSCULAR | Status: AC
  Filled 2021-09-20: qty 5

## 2021-09-20 MED ORDER — THROMBIN 5000 UNITS EX SOLR
OROMUCOSAL | Status: DC | PRN
  Administered 2021-09-20: 5 mL via TOPICAL

## 2021-09-20 MED ORDER — VENLAFAXINE HCL ER 75 MG PO CP24: 75.0000 mg | ORAL_CAPSULE | Freq: Every day | ORAL | Status: DC

## 2021-09-20 MED ORDER — LACTATED RINGERS IV SOLN: INTRAVENOUS | Status: DC | PRN

## 2021-09-20 MED ORDER — FENTANYL CITRATE (PF) 250 MCG/5ML IJ SOLN
INTRAMUSCULAR | Status: AC
  Filled 2021-09-20: qty 5

## 2021-09-20 MED ORDER — CHLORHEXIDINE GLUCONATE CLOTH 2 % EX PADS: 6.0000 | MEDICATED_PAD | Freq: Once | CUTANEOUS | Status: DC

## 2021-09-20 MED ORDER — HYDROMORPHONE HCL 1 MG/ML IJ SOLN: 0.2500 mg | INTRAMUSCULAR | Status: DC | PRN

## 2021-09-20 MED ORDER — SODIUM CHLORIDE 0.9% FLUSH
3.0000 mL | Freq: Two times a day (BID) | INTRAVENOUS | Status: DC
  Administered 2021-09-20 (×2): 3 mL via INTRAVENOUS

## 2021-09-20 MED ORDER — ONDANSETRON HCL 4 MG PO TABS: 4.0000 mg | ORAL_TABLET | Freq: Four times a day (QID) | ORAL | Status: DC | PRN

## 2021-09-20 MED ORDER — KETOROLAC TROMETHAMINE 30 MG/ML IJ SOLN
INTRAMUSCULAR | Status: AC
  Filled 2021-09-20: qty 1

## 2021-09-20 MED ORDER — HYDROMORPHONE HCL 1 MG/ML IJ SOLN
INTRAMUSCULAR | Status: AC
  Filled 2021-09-20: qty 1

## 2021-09-20 MED ORDER — DOCUSATE SODIUM 100 MG PO CAPS
100.0000 mg | ORAL_CAPSULE | Freq: Two times a day (BID) | ORAL | Status: DC
  Administered 2021-09-20 (×2): 100 mg via ORAL
  Filled 2021-09-20 (×2): qty 1

## 2021-09-20 MED ORDER — PHENYLEPHRINE HCL-NACL 20-0.9 MG/250ML-% IV SOLN
INTRAVENOUS | Status: DC | PRN
  Administered 2021-09-20: 5 ug/min via INTRAVENOUS

## 2021-09-20 MED ORDER — OXYCODONE HCL 5 MG/5ML PO SOLN: 5.0000 mg | Freq: Once | ORAL | Status: DC | PRN

## 2021-09-20 MED ORDER — KETOROLAC TROMETHAMINE 30 MG/ML IJ SOLN
INTRAMUSCULAR | Status: DC | PRN
  Administered 2021-09-20: 30 mg via INTRAVENOUS

## 2021-09-20 MED ORDER — OXYCODONE HCL 5 MG PO TABS: 5.0000 mg | ORAL_TABLET | Freq: Once | ORAL | Status: DC | PRN

## 2021-09-20 MED ORDER — SUGAMMADEX SODIUM 200 MG/2ML IV SOLN
INTRAVENOUS | Status: DC | PRN
  Administered 2021-09-20: 200 mg via INTRAVENOUS

## 2021-09-20 MED ORDER — HEMOSTATIC AGENTS (NO CHARGE) OPTIME
TOPICAL | Status: DC | PRN
  Administered 2021-09-20: 1 via TOPICAL

## 2021-09-20 MED ORDER — SODIUM CHLORIDE 0.9% FLUSH
3.0000 mL | INTRAVENOUS | Status: DC | PRN
Start: 2021-09-20 — End: 2021-09-21

## 2021-09-20 MED ORDER — TRIAMCINOLONE ACETONIDE 40 MG/ML IJ SUSP
INTRAMUSCULAR | Status: DC | PRN
Start: 2021-09-20 — End: 2021-09-20
  Administered 2021-09-20: 40 mg

## 2021-09-20 MED ORDER — HYDROMORPHONE HCL 1 MG/ML IJ SOLN: 0.5000 mg | INTRAMUSCULAR | Status: DC | PRN

## 2021-09-20 MED ORDER — CEFAZOLIN SODIUM-DEXTROSE 2-4 GM/100ML-% IV SOLN
2.0000 g | Freq: Three times a day (TID) | INTRAVENOUS | Status: AC
  Administered 2021-09-20 (×2): 2 g via INTRAVENOUS
  Filled 2021-09-20 (×2): qty 100

## 2021-09-20 MED ORDER — KETAMINE HCL 10 MG/ML IJ SOLN
INTRAMUSCULAR | Status: DC | PRN
  Administered 2021-09-20: 25 mg via INTRAVENOUS

## 2021-09-20 MED ORDER — ROCURONIUM BROMIDE 10 MG/ML (PF) SYRINGE
PREFILLED_SYRINGE | INTRAVENOUS | Status: DC | PRN
  Administered 2021-09-20: 30 mg via INTRAVENOUS
  Administered 2021-09-20: 70 mg via INTRAVENOUS
  Administered 2021-09-20: 20 mg via INTRAVENOUS
  Administered 2021-09-20 (×2): 30 mg via INTRAVENOUS
  Administered 2021-09-20: 20 mg via INTRAVENOUS

## 2021-09-20 MED ORDER — ONDANSETRON HCL 4 MG/2ML IJ SOLN: 4.0000 mg | Freq: Once | INTRAMUSCULAR | Status: DC | PRN

## 2021-09-20 MED ORDER — METHOCARBAMOL 1000 MG/10ML IJ SOLN
500.0000 mg | Freq: Four times a day (QID) | INTRAVENOUS | Status: DC | PRN
  Filled 2021-09-20: qty 5

## 2021-09-20 MED ORDER — ACETAMINOPHEN 650 MG RE SUPP: 650.0000 mg | RECTAL | Status: DC | PRN

## 2021-09-20 MED ORDER — LIDOCAINE 2% (20 MG/ML) 5 ML SYRINGE
INTRAMUSCULAR | Status: DC | PRN
  Administered 2021-09-20: 60 mg via INTRAVENOUS

## 2021-09-20 MED ORDER — PHENYLEPHRINE 80 MCG/ML (10ML) SYRINGE FOR IV PUSH (FOR BLOOD PRESSURE SUPPORT)
PREFILLED_SYRINGE | INTRAVENOUS | Status: DC | PRN
  Administered 2021-09-20: 40 ug via INTRAVENOUS

## 2021-09-20 SURGICAL SUPPLY — 72 items
APL SKNCLS STERI-STRIP NONHPOA (GAUZE/BANDAGES/DRESSINGS)
BAG COUNTER SPONGE SURGICOUNT (BAG) ×2 IMPLANT
BAG SPNG CNTER NS LX DISP (BAG) ×1
BAND INSRT 18 STRL LF DISP RB (MISCELLANEOUS) ×2
BAND RUBBER #18 3X1/16 STRL (MISCELLANEOUS) ×4 IMPLANT
BENZOIN TINCTURE PRP APPL 2/3 (GAUZE/BANDAGES/DRESSINGS) IMPLANT
BIT DRILL NEURO 2X3.1 SFT TUCH (MISCELLANEOUS) ×1 IMPLANT
BLADE CLIPPER SURG (BLADE) IMPLANT
BUR CARBIDE MATCH 3.0 (BURR) ×3 IMPLANT
CANISTER SUCT 3000ML PPV (MISCELLANEOUS) ×2 IMPLANT
CARTRIDGE OIL MAESTRO DRILL (MISCELLANEOUS) ×1 IMPLANT
COVER MAYO STAND STRL (DRAPES) ×4 IMPLANT
DIFFUSER DRILL AIR PNEUMATIC (MISCELLANEOUS) ×2 IMPLANT
DRAPE C-ARM 42X72 X-RAY (DRAPES) ×2 IMPLANT
DRAPE HALF SHEET 40X57 (DRAPES) IMPLANT
DRAPE LAPAROTOMY 100X72X124 (DRAPES) ×2 IMPLANT
DRAPE MICROSCOPE LEICA (MISCELLANEOUS) ×2 IMPLANT
DRILL NEURO 2X3.1 SOFT TOUCH (MISCELLANEOUS) ×2
DRSG OPSITE 4X5.5 SM (GAUZE/BANDAGES/DRESSINGS) ×1 IMPLANT
DRSG OPSITE POSTOP 4X6 (GAUZE/BANDAGES/DRESSINGS) ×1 IMPLANT
DRSG TELFA 3X8 NADH (GAUZE/BANDAGES/DRESSINGS) ×2 IMPLANT
DURAPREP 6ML APPLICATOR 50/CS (WOUND CARE) ×2 IMPLANT
ELECT COATED BLADE 2.86 ST (ELECTRODE) ×2 IMPLANT
ELECT REM PT RETURN 9FT ADLT (ELECTROSURGICAL) ×2
ELECTRODE REM PT RTRN 9FT ADLT (ELECTROSURGICAL) ×1 IMPLANT
EVACUATOR 1/8 PVC DRAIN (DRAIN) ×1 IMPLANT
GAUZE 4X4 16PLY ~~LOC~~+RFID DBL (SPONGE) IMPLANT
GLOVE BIOGEL PI IND STRL 8 (GLOVE) ×1 IMPLANT
GLOVE BIOGEL PI INDICATOR 8 (GLOVE) ×1
GLOVE ECLIPSE 7.0 STRL STRAW (GLOVE) ×3 IMPLANT
GLOVE ECLIPSE 8.0 STRL XLNG CF (GLOVE) ×5 IMPLANT
GLOVE EXAM NITRILE LRG STRL (GLOVE) IMPLANT
GLOVE EXAM NITRILE XL STR (GLOVE) IMPLANT
GLOVE EXAM NITRILE XS STR PU (GLOVE) IMPLANT
GLOVE INDICATOR 7.5 STRL GRN (GLOVE) ×2 IMPLANT
GLOVE SURG ENC MOIS LTX SZ8 (GLOVE) ×2 IMPLANT
GLOVE SURG SS PI 7.5 STRL IVOR (GLOVE) ×3 IMPLANT
GLOVE SURG UNDER POLY LF SZ8.5 (GLOVE) ×2 IMPLANT
GOWN STRL REUS W/ TWL LRG LVL3 (GOWN DISPOSABLE) IMPLANT
GOWN STRL REUS W/ TWL XL LVL3 (GOWN DISPOSABLE) ×2 IMPLANT
GOWN STRL REUS W/TWL 2XL LVL3 (GOWN DISPOSABLE) IMPLANT
GOWN STRL REUS W/TWL LRG LVL3 (GOWN DISPOSABLE)
GOWN STRL REUS W/TWL XL LVL3 (GOWN DISPOSABLE) ×6
HEMOSTAT POWDER KIT SURGIFOAM (HEMOSTASIS) ×2 IMPLANT
IMPL CERV LORD 12X14X6 7D (Neuro Prosthesis/Implant) IMPLANT
IMPLANT CERV LORD 12X14X6 7D (Neuro Prosthesis/Implant) ×4 IMPLANT
KIT BASIN OR (CUSTOM PROCEDURE TRAY) ×2 IMPLANT
KIT TURNOVER KIT B (KITS) ×2 IMPLANT
NDL SPNL 18GX3.5 QUINCKE PK (NEEDLE) ×1 IMPLANT
NEEDLE HYPO 22GX1.5 SAFETY (NEEDLE) ×2 IMPLANT
NEEDLE SPNL 18GX3.5 QUINCKE PK (NEEDLE) ×2 IMPLANT
NS IRRIG 1000ML POUR BTL (IV SOLUTION) ×2 IMPLANT
OIL CARTRIDGE MAESTRO DRILL (MISCELLANEOUS) ×2
PACK LAMINECTOMY NEURO (CUSTOM PROCEDURE TRAY) ×2 IMPLANT
PAD ARMBOARD 7.5X6 YLW CONV (MISCELLANEOUS) ×6 IMPLANT
PAD DRESSING TELFA 3X8 NADH (GAUZE/BANDAGES/DRESSINGS) IMPLANT
PIN DISTRACTION 14MM (PIN) ×4 IMPLANT
PLATE CERV CONS OZARK 3X54 (Plate) ×1 IMPLANT
PUTTY BONE 100 VESUVIUS 2.5CC (Putty) ×1 IMPLANT
SCREW VA ST OZARK 4X14 (Screw) ×10 IMPLANT
SCREW VA ST OZARK 4X14 FX (Screw) IMPLANT
SPACER LORD CASC 14X12X7 7D (Spacer) ×1 IMPLANT
SPONGE INTESTINAL PEANUT (DISPOSABLE) ×3 IMPLANT
SPONGE SURGIFOAM ABS GEL SZ50 (HEMOSTASIS) ×2 IMPLANT
STAPLER VISISTAT 35W (STAPLE) IMPLANT
STRIP CLOSURE SKIN 1/2X4 (GAUZE/BANDAGES/DRESSINGS) ×2 IMPLANT
TAPE STRIPS DRAPE STRL (GAUZE/BANDAGES/DRESSINGS) ×1 IMPLANT
TAPE SURG TRANSPORE 1 IN (GAUZE/BANDAGES/DRESSINGS) ×1 IMPLANT
TAPE SURGICAL TRANSPORE 1 IN (GAUZE/BANDAGES/DRESSINGS) ×2
TOWEL GREEN STERILE (TOWEL DISPOSABLE) ×2 IMPLANT
TOWEL GREEN STERILE FF (TOWEL DISPOSABLE) ×2 IMPLANT
WATER STERILE IRR 1000ML POUR (IV SOLUTION) ×2 IMPLANT

## 2021-09-20 NOTE — Transfer of Care (Signed)
Immediate Anesthesia Transfer of Care Note ? ?Patient: Angela [name] ? ?Procedure(s) Performed: Anterior Cervical Discectomy Fusion  Cervical Four-Five, Cervical Five-Six, Cervical Six-Seven ? ?Patient Location: PACU ? ?Anesthesia Type:General ? ?Level of Consciousness: awake, alert , oriented and patient cooperative ? ?Airway & Oxygen Therapy: Patient Spontanous Breathing and Patient connected to nasal cannula oxygen ? ?Post-op Assessment: Report given to RN and Post -op Vital signs reviewed and stable ? ?Post vital signs: Reviewed and stable ? ?Last Vitals:  ?Vitals Value Taken Time  ?BP 129/82 09/20/21 1112  ?Temp    ?Pulse 112 09/20/21 1114  ?Resp 15 09/20/21 1114  ?SpO2 97 % 09/20/21 1114  ?Vitals shown include unvalidated device data. ? ?Last Pain:  ?Vitals:  ? 09/20/21 0641  ?TempSrc:   ?PainSc: 3   ?   ? ?Patients Stated Pain Goal: 3 (09/20/21 8546) ? ?Complications: No notable events documented. ?

## 2021-09-20 NOTE — H&P (Signed)
? ? ?Providing Compassionate, Quality Care - Together ? ?NEUROSURGERY HISTORY & PHYSICAL ? ? ?Angela [name] is an 51 y.o. female.   ?Chief Complaint: BUE radiculopathy and neck pain ?HPI: This is a 51 yo F with progressive neck pain and BUE radiculopathy. She was found to have cervical spondylosis and foraminal narrowing C4-7. She failed conservative measures and presents today for surgical intervention in the form of an ACDF C4-7. ? ?Past Medical History:  ?Diagnosis Date  ? Arthritis 2016  ? osteo in hands, knees, neck hips  ? Chest pain   ? a cath 2/10. EF 50-55% normal coronaries  ? Complete heart block ([place])   ? Depression   ? Dysautonomia ([place])   ? Dyspnea   ? CT negative for PE, 2007  ? Hypertension   ? Insomnia   ? OCD (obsessive compulsive disorder)   ? SVT (supraventricular tachycardia) ([place])   ? s/p ablation a. c/b AV nod ablation requiring pacemaker  ? ? ?Past Surgical History:  ?Procedure Laterality Date  ? BREAST BIOPSY Right 1998  ? BREAST BIOPSY Right 2004  ? BREAST LUMPECTOMY    ? RIGHT -BREAST   ? COLONOSCOPY WITH PROPOFOL N/A 09/07/2019  ? Procedure: COLONOSCOPY WITH PROPOFOL;  Surgeon: Lucilla Lame, MD;  Location: [facility] ENDOSCOPY;  Service: Endoscopy;  Laterality: N/A;  ? ENDOMETRIAL ABLATION  2005  ? INSERT / REPLACE / REMOVE PACEMAKER    ? PELVIC LAPAROSCOPY  1990  ? PERMANENT PACEMAKER GENERATOR CHANGE N/A 07/13/2014  ? MDT MRI compatible dual chamber pacemaker implanted by Dr Caryl Comes  ? SPARK  2005  ? TUBAL LIGATION  2003  ? ? ?Family History  ?Problem Relation Age of Onset  ? Hypertension Father   ? Cancer Father 13  ?     COLON  ? Diabetes Father   ? Breast cancer Paternal Aunt   ? Cancer Paternal Aunt   ? Hypertension Maternal Grandfather   ? Diabetes Paternal Grandmother   ? Breast cancer Paternal Grandmother   ? Arthritis Paternal Grandmother   ? Cancer Paternal Grandmother   ? Diabetes Paternal Grandfather   ? Cancer Paternal Grandfather   ? Breast cancer Maternal Aunt   ?     mastectomy   ? Cancer Maternal Aunt   ? Anxiety disorder Mother   ? Arthritis Mother   ? Obesity Mother   ? Dementia Maternal Grandmother   ? Breast cancer Paternal Aunt   ? Anxiety disorder Sister   ? ?Social History:  reports that she has never smoked. She has never used smokeless tobacco. She reports that she does not drink alcohol and does not use drugs. ? ?Allergies:  ?Allergies  ?Allergen Reactions  ? Toprol Xl [Metoprolol Succinate] Photosensitivity  ? ? ?Medications Prior to Admission  ?Medication Sig Dispense Refill  ? aspirin EC 81 MG tablet Take 81 mg by mouth daily. Swallow whole.    ? celecoxib (CELEBREX) 100 MG capsule Take 100 mg by mouth 2 (two) times daily.    ? cyclobenzaprine (FLEXERIL) 5 MG tablet Take 5 mg by mouth 3 (three) times daily as needed for muscle spasms.    ? diclofenac Sodium (VOLTAREN) 1 % GEL Apply 1 application. topically 2 (two) times daily as needed (pain).    ? FIBER ADULT GUMMIES PO Take 2 each by mouth daily. Vita Fusion Fiberwell    ? LORazepam (ATIVAN) 0.5 MG tablet TAKE 1-2 TABLET(0.5-1 MG) BY MOUTH DAILY AS NEEDED FOR ANXIETY 45 tablet 0  ?  Multiple Vitamins-Minerals (MULTIVITAMIN WITH MINERALS) tablet Take 1 tablet by mouth daily.    ? traMADol (ULTRAM) 50 MG tablet Take 50 mg by mouth every 6 (six) hours as needed for severe pain.    ? venlafaxine XR (EFFEXOR XR) 75 MG 24 hr capsule Take 1 tab daily with her '150mg'$  for a total of '225mg'$  daily 90 capsule 1  ? venlafaxine XR (EFFEXOR-XR) 150 MG 24 hr capsule Take 1 capsule (150 mg total) by mouth daily with breakfast. 90 capsule 1  ? ? ?Results for orders placed or performed during the hospital encounter of 09/20/21 (from the past 48 hour(s))  ?Pregnancy, urine POC     Status: None  ? Collection Time: 09/20/21  7:03 AM  ?Result Value Ref Range  ? Preg Test, Ur NEGATIVE NEGATIVE  ?  Comment:        ?THE SENSITIVITY OF THIS ?METHODOLOGY IS >24 mIU/mL ?  ? ?No results found. ? ?ROS ?All +/- are above ? ?Blood pressure (!) 139/97, pulse  87, temperature 98.2 ?F (36.8 ?C), temperature source Oral, resp. rate 18, height '5\' 4"'$  (1.626 m), weight 67.1 kg, last menstrual period 09/10/2021, SpO2 100 %. ?Physical Exam  ?AAO x3  ?PERRL ?Fcx4 ?Speech fluent and appropriate ?CN 2-12 intact ?BUE 4+/5 grip/tri/bi, otherwise 5/5 ?BLE 5/5 ? ? ?Assessment/Plan ?51 yo F with: ? ?C4-7 spondylosis with foraminal stenosis and radiculopathy ? ?-OR today for C4-7 ACDF. All risks, benefits discussed and agreed upon, informed consent obtained.  ? ? ?Thank you for allowing me to participate in this patient's care.  Please do not hesitate to call with questions or concerns. ? ? ?Mirakle Tomlin, DO ?Neurosurgeon ?[facility] ?Cell: [phone] ? ? ? ? ?

## 2021-09-20 NOTE — Anesthesia Procedure Notes (Signed)
Procedure Name: Intubation ?Date/Time: 09/20/2021 7:48 AM ?Performed by: Colin Benton, CRNA ?Pre-anesthesia Checklist: Patient identified, Emergency Drugs available, Suction available and Patient being monitored ?Patient Re-evaluated:Patient Re-evaluated prior to induction ?Oxygen Delivery Method: Circle system utilized ?Preoxygenation: Pre-oxygenation with 100% oxygen ?Induction Type: IV induction ?Ventilation: Mask ventilation without difficulty ?Laryngoscope Size: Glidescope and 3 ?Grade View: Grade I ?Tube type: Oral ?Tube size: 7.0 mm ?Number of attempts: 1 ?Airway Equipment and Method: Rigid stylet and Video-laryngoscopy ?Placement Confirmation: ETT inserted through vocal cords under direct vision, positive ETCO2 and breath sounds checked- equal and bilateral ?Secured at: 20 cm ?Tube secured with: Tape ?Dental Injury: Teeth and Oropharynx as per pre-operative assessment  ? ? ? ? ?

## 2021-09-20 NOTE — Plan of Care (Signed)
?  Problem: Education: Goal: Ability to verbalize activity precautions or restrictions will improve Outcome: Completed/Met Goal: Knowledge of the prescribed therapeutic regimen will improve Outcome: Completed/Met Goal: Understanding of discharge needs will improve Outcome: Completed/Met   Problem: Activity: Goal: Ability to avoid complications of mobility impairment will improve Outcome: Completed/Met Goal: Ability to tolerate increased activity will improve Outcome: Completed/Met Goal: Will remain free from falls Outcome: Completed/Met   Problem: Bowel/Gastric: Goal: Gastrointestinal status for postoperative course will improve Outcome: Completed/Met   Problem: Clinical Measurements: Goal: Ability to maintain clinical measurements within normal limits will improve Outcome: Completed/Met Goal: Postoperative complications will be avoided or minimized Outcome: Completed/Met Goal: Diagnostic test results will improve Outcome: Completed/Met   Problem: Pain Management: Goal: Pain level will decrease Outcome: Completed/Met   Problem: Skin Integrity: Goal: Will show signs of wound healing Outcome: Completed/Met   Problem: Health Behavior/Discharge Planning: Goal: Identification of resources available to assist in meeting health care needs will improve Outcome: Completed/Met   Problem: Bladder/Genitourinary: Goal: Urinary functional status for postoperative course will improve Outcome: Completed/Met   Problem: Safety: Goal: Ability to remain free from injury will improve Outcome: Completed/Met   

## 2021-09-20 NOTE — Anesthesia Postprocedure Evaluation (Signed)
Anesthesia Post Note ? ?Patient: SUDA FORBESS ? ?Procedure(s) Performed: Anterior Cervical Discectomy Fusion  Cervical Four-Five, Cervical Five-Six, Cervical Six-Seven ? ?  ? ?Patient location during evaluation: PACU ?Anesthesia Type: General ?Level of consciousness: awake and alert ?Pain management: pain level controlled ?Vital Signs Assessment: post-procedure vital signs reviewed and stable ?Respiratory status: spontaneous breathing, nonlabored ventilation and respiratory function stable ?Cardiovascular status: blood pressure returned to baseline and stable ?Postop Assessment: no apparent nausea or vomiting ?Anesthetic complications: no ? ? ?No notable events documented. ? ?Last Vitals:  ?Vitals:  ? 09/20/21 1130 09/20/21 1145  ?BP: 123/78   ?Pulse: (!) 111   ?Resp: 16   ?Temp:    ?SpO2: 99% 96%  ?  ?Last Pain:  ?Vitals:  ? 09/20/21 1115  ?TempSrc:   ?PainSc: 0-No pain  ? ? ?  ?  ?  ?  ?  ?  ? ?Lidia Collum ? ? ? ? ?

## 2021-09-20 NOTE — Op Note (Signed)
? ?Providing Compassionate, Quality Care - Together ? ?Date of service: 09/20/2021 ? ?PREOP DIAGNOSIS: Cervical spondylosis with radiculopathy, C4-5, C5-6, C6-7 ? ?POSTOP DIAGNOSIS: Same ? ?PROCEDURE: ?1. Arthrodesis C4-5, C5-6, C6-7, anterior interbody technique  ?2. Placement of intervertebral biomechanical device C4-5, C5-6, C6-7; K2M [place] C4-5: : 7 x 12 x 14 mm interbody, C5-6: 6 x 12 x 14 mm, C6-7: 6 x 12 x 14 mm interbody ?3. Placement of anterior instrumentation consisting of interbody plate and screws -K2 M Ozark plate, 54 mm, 4.0 x 14 mm screws x2 at C4, C5, C6, C7 ?4. Discectomy at C4-5, C5-6, C6-7 for decompression of spinal cord and exiting nerve roots  ?5. Use of morselized bone allograft  ?6.  Use of autograft, same incision ?7. Use of intraoperative microscope ? ?SURGEON: Dr. Elwin Sleight, DO ? ?ASSISTANT: Dr. Duffy Rhody, MD ? ?ANESTHESIA: General Endotracheal ? ?EBL: 50 cc ? ?SPECIMENS: None ? ?DRAINS: Medium Hemovac ? ?COMPLICATIONS: None immediate ? ?CONDITION: Hemodynamically stable to PACU ? ?HISTORY: ?REN GRASSE is a 51 y.o. y.o. female who initially presented to the outpatient clinic with signs and symptoms consistent with neck pain and bilateral, right greater than left radiculopathy. MRI demonstrated cervical spondylosis at C4-5, C5-6 and C6-7 with moderate neuroforaminal narrowing bilaterally, grade 1 anterior listhesis of C4 on C5 with bilateral facet hypertrophy. Treatment options were discussed including conservative measures, physical therapy, pain control, epidural steroid injections.  She underwent multiple measures, and failed long-term conservative treatment therefore I offered her an anterior cervical discectomy and fusion at C4-5, C5-6, C6-7.  We discussed all risks, benefits and expected outcomes as well as alternatives to treatment. After all questions were answered, informed consent was obtained. ? ?PROCEDURE IN DETAIL: ?The patient was brought to the operating  room and transferred to the operative table. After induction of general anesthesia, the patient was positioned on the operative table in the supine position with all pressure points meticulously padded. The skin of the neck was then prepped and draped in the usual sterile fashion.  Physician driven timeout was performed. ? ?After timeout was conducted, skin incision was then made sharply with a 10 blade and Bovie electrocautery was used to dissect the subcutaneous tissue until the platysma was identified. The platysma was then divided and undermined. The sternocleidomastoid muscle was then identified and, utilizing natural fascial planes in the neck, the prevertebral fascia was identified and the carotid sheath was retracted laterally and the trachea and esophagus retracted medially. Again using fluoroscopy, the correct disc space was identified. Bovie electrocautery was used to dissect in the subperiosteal plane and elevate the bilateral longus coli muscles at C4, C5, C6, C7. At this point, the microscope was draped and brought into the field, and the remainder of the case was done under the microscope using microdissecting technique. Self-retaining retractors were then placed under the longus coli muscles bilaterally.  ? ?ACDF C4-5: Distraction pins were placed in midline above and below the disc space.  The disc space was placed in distraction.  The disc space was incised sharply and rongeurs were use to initially complete a discectomy. The high-speed drill was then used to complete discectomy until the posterior annulus was identified and removed and the posterior longitudinal ligament was identified. Using microcurettes, the PLL was elevated, and Kerrison rongeurs were used to remove the posterior longitudinal ligament and the ventral thecal sac was identified. Using a combination of curettes and ronguers, complete decompression of the thecal sac and exiting nerve roots at  this level was completed, and verified  using micro-nerve hook. The disc space was taken out of distraction.  Epidural hemostasis was achieved with Surgifoam.  Using the high-speed drill, the remaining uncovertebral joints were decorticated and the collected bone dust was packed laterally. ? ?Having completed our decompression, attention was turned to placement of the intervertebral device. Trial spacers were used to select a 7 mm graft. This graft was then filled with morcellized allograft, and inserted under live fluoroscopy. ? ?ACDF C5-6: Distraction pins were placed in midline above and below the disc space.  The disc space was placed in distraction.  The disc space was incised sharply and rongeurs were use to initially complete a discectomy. The high-speed drill was then used to complete discectomy until the posterior annulus was identified and removed and the posterior longitudinal ligament was identified. Using microcurettes, the PLL was elevated, and Kerrison rongeurs were used to remove the posterior longitudinal ligament and the ventral thecal sac was identified. Using a combination of curettes and ronguers, complete decompression of the thecal sac and exiting nerve roots at this level was completed, and verified using micro-nerve hook. The disc space was taken out of distraction.  Epidural hemostasis was achieved with Surgifoam.  Using the high-speed drill, the remaining uncovertebral joints were decorticated and the collected bone dust was packed laterally. ? ?Having completed our decompression, attention was turned to placement of the intervertebral device. Trial spacers were used to select a 93m graft. This graft was then filled with morcellized allograft, and inserted under live fluoroscopy. ? ?ACDF C6-7: Distraction pins were placed in midline above and below the disc space.  The disc space was placed in distraction.  The disc space was incised sharply and rongeurs were use to initially complete a discectomy. The high-speed drill was then  used to complete discectomy until the posterior annulus was identified and removed and the posterior longitudinal ligament was identified. Using microcurettes, the PLL was elevated, and Kerrison rongeurs were used to remove the posterior longitudinal ligament and the ventral thecal sac was identified. Using a combination of curettes and ronguers, complete decompression of the thecal sac and exiting nerve roots at this level was completed, and verified using micro-nerve hook. The disc space was taken out of distraction.  Epidural hemostasis was achieved with Surgifoam.  Using the high-speed drill, the remaining uncovertebral joints were decorticated and the collected bone dust was packed laterally.  Anterior osteophytic ridges were removed with Kerrison rongeur and saved for placement in the interbody device. ? ?Having completed our decompression, attention was turned to placement of the intervertebral device. Trial spacers were used to select a 673mgraft. This graft was then filled with morcellized allograft, autograft, and inserted under live fluoroscopy. ? ?After placement of the intervertebral devices, the above anterior cervical plate was selected, and placed across the interspaces. Using a high-speed drill, the cortex of the cervical vertebral bodies was punctured at C4, C5, C6, C7, and screws inserted with appropriate bony purchase. Final fluoroscopic images in AP and lateral projections were taken to confirm good hardware placement.  The plate was final tightened to the manufacturer's recommendation and the screws were locked in place. ? ?At this point, after all counts were verified to be correct, meticulous hemostasis was secured using a combination of bipolar electrocautery and passive hemostatics.  A medium Hemovac was then tunneled laterally and placed in the prevertebral space.  Skin was closed with staples.  Sterile dressing was applied. ? ?The patient tolerated the procedure  well and was extubated in  the room and taken to the postanesthesia care unit in stable condition.  ?

## 2021-09-21 ENCOUNTER — Encounter (HOSPITAL_COMMUNITY): Payer: Self-pay | Admitting: Neurological Surgery

## 2021-09-21 MED ORDER — HYDROCODONE-ACETAMINOPHEN 5-325 MG PO TABS: 1.0000 | ORAL_TABLET | ORAL | 0 refills | Status: DC | PRN

## 2021-09-21 NOTE — Discharge Summary (Signed)
Physician Discharge Summary  ?Patient ID: ?Angela [name] ?MRN: [id number] ?DOB/AGE: [birth year] 51 y.o. ? ?Admit date: 09/20/2021 ?Discharge date: 09/21/2021 ? ?Admission Diagnoses: Cervical spondylosis with radiculopathy, C4-5, C5-6, C6-7 ? ?Discharge Diagnoses: Cervical spondylosis with radiculopathy, C4-5, C5-6, C6-7 ?Principal Problem: ?  Cervical radiculopathy at C6 ? ? ?Discharged Condition: good ? ?Hospital Course: The patient was admitted on 09/20/2021 and taken to the operating room where the patient underwent ACDF C4/5, C5/6, C6/7. The patient tolerated the procedure well and was taken to the recovery room and then to the floor in stable condition. The hospital course was routine. There were no complications. The wound remained clean dry and intact. Pt had appropriate upper back/neck soreness. No complaints of arm/leg pain or new N/T/W. The patient remained afebrile with stable vital signs, and tolerated a regular diet. The patient continued to increase activities, and pain was well controlled with oral pain medications. ? ? ?Consults: None ? ?Significant Diagnostic Studies: radiology: X-Ray: intraoperative ? ? ?Treatments: surgery:  ? ?1. Arthrodesis C4-5, C5-6, C6-7, anterior interbody technique  ?2. Placement of intervertebral biomechanical device C4-5, C5-6, C6-7; K2M [place] C4-5: : 7 x 12 x 14 mm interbody, C5-6: 6 x 12 x 14 mm, C6-7: 6 x 12 x 14 mm interbody ?3. Placement of anterior instrumentation consisting of interbody plate and screws -K2 M Ozark plate, 54 mm, 4.0 x 14 mm screws x2 at C4, C5, C6, C7 ?4. Discectomy at C4-5, C5-6, C6-7 for decompression of spinal cord and exiting nerve roots  ?5. Use of morselized bone allograft  ?6.  Use of autograft, same incision ?7. Use of intraoperative microscope ? ? ?Discharge Exam: ?Blood pressure (!) 136/93, pulse 95, temperature 98.4 ?F (36.9 ?C), temperature source Oral, resp. rate 18, height '5\' 4"'$  (1.626 m), weight 67.1 kg, last menstrual period  09/10/2021, SpO2 97 %. ?Physical Exam: Patient is awake, A/O X 4, conversant, and in good spirits. Eyes open spontaneously. They are in NAD and VSS. Doing well. Speech is fluent and appropriate. MAEW.  BUE 5/5 throughout except 4+/5 grip/tri/bi, BLE 5/5 throughout. Sensation to light touch is intact. PERLA, EOMI. CNs grossly intact. Dressing is clean dry intact. Incision is well approximated with no drainage, erythema, or edema. Hard cervical collar in place ? ? ? ? ? ?Disposition: Discharge disposition: 01-Home or Self Care ? ? ? ? ? ? ?Discharge Instructions   ? ? Incentive spirometry RT   Complete by: As directed ?  ? ?  ? ?Allergies as of 09/21/2021   ? ?   Reactions  ? Toprol Xl [metoprolol Succinate] Photosensitivity  ? ?  ? ?  ?Medication List  ?  ? ?STOP taking these medications   ? ?celecoxib 100 MG capsule ?Commonly known as: CELEBREX ?  ?diclofenac Sodium 1 % Gel ?Commonly known as: VOLTAREN ?  ?traMADol 50 MG tablet ?Commonly known as: ULTRAM ?  ? ?  ? ?TAKE these medications   ? ?aspirin EC 81 MG tablet ?Take 81 mg by mouth daily. Swallow whole. ?  ?cyclobenzaprine 5 MG tablet ?Commonly known as: FLEXERIL ?Take 5 mg by mouth 3 (three) times daily as needed for muscle spasms. ?  ?FIBER ADULT GUMMIES PO ?Take 2 each by mouth daily. Vita Fusion Fiberwell ?  ?HYDROcodone-acetaminophen 5-325 MG tablet ?Commonly known as: NORCO/VICODIN ?Take 1-2 tablets by mouth every 4 (four) hours as needed for moderate pain. ?  ?LORazepam 0.5 MG tablet ?Commonly known as: ATIVAN ?TAKE 1-2 TABLET(0.5-1 MG) BY MOUTH DAILY  AS NEEDED FOR ANXIETY ?  ?multivitamin with minerals tablet ?Take 1 tablet by mouth daily. ?  ?venlafaxine XR 75 MG 24 hr capsule ?Commonly known as: Effexor XR ?Take 1 tab daily with her '150mg'$  for a total of '225mg'$  daily ?  ?venlafaxine XR 150 MG 24 hr capsule ?Commonly known as: EFFEXOR-XR ?Take 1 capsule (150 mg total) by mouth daily with breakfast. ?  ? ?  ? ? ? ?Signed: ?Marvis Moeller, DNP,  NP-C ?09/21/2021, 8:20 AM ? ? ?

## 2021-09-21 NOTE — Progress Notes (Signed)
PT Cancellation Note ? ?Patient Details ?Name: DIJON KOHLMAN ?MRN: [id number] ?DOB: [birth year] ? ? ?Cancelled Treatment:    Reason Eval/Treat Not Completed: PT screened, no needs identified, will sign off Per OT, patient mobilizing independently and all education completed. No skilled PT needs identified. PT will sign off.  ? ?Sinaya Minogue A. Gilford Rile, PT, DPT ?Acute Rehabilitation Services ?Pager [phone] ?Office [phone] ? ? ?Kearah Gayden A Keontre Defino ?09/21/2021, 8:19 AM ?

## 2021-09-21 NOTE — Evaluation (Signed)
Occupational Therapy Evaluation ?Patient Details ?Name: Angela [name] ?MRN: [id number] ?DOB: [birth year] ?Today's Date: 09/21/2021 ? ? ?History of Present Illness 51 yo female s/p ACDF C4-7 on 4/20. PMH including arthritis, HTN, depression, OCD, SVT, and pacemaker.  ? ?Clinical Impression ?  ?PTA, pt was living with her husband and was independent. Currently, pt performing ADLs and functional mobility at Mod I-Independent level. Provided education and handout on cervical precautions, brace management, grooming, UB ADLs, LB ADLs, toileting, stair management, and tub transfer; pt demonstrated understanding. Answered all pt questions. Recommend dc home once medically stable per physician. All acute OT needs met and will sign off. Thank you.   ? ?Recommendations for follow up therapy are one component of a multi-disciplinary discharge planning process, led by the attending physician.  Recommendations may be updated based on patient status, additional functional criteria and insurance authorization.  ? ?Follow Up Recommendations ? No OT follow up  ?  ?Assistance Recommended at Discharge PRN  ?Patient can return home with the following   ? ?  ?Functional Status Assessment ? Patient has had a recent decline in their functional status and demonstrates the ability to make significant improvements in function in a reasonable and predictable amount of time.  ?Equipment Recommendations ? None recommended by OT  ?  ?Recommendations for Other Services   ? ? ?  ?Precautions / Restrictions Precautions ?Precautions: Cervical ?Precaution Booklet Issued: Yes (comment) ?Precaution Comments: Reviewed cervical precautions and compensatory techniques for ADLs ?Required Braces or Orthoses: Cervical Brace ?Cervical Brace: Hard collar;At all times (off for showers) ?Restrictions ?Weight Bearing Restrictions: No  ? ?  ? ?Mobility Bed Mobility ?Overal bed mobility: Independent ?  ?  ?  ?  ?  ?  ?  ?  ? ?Transfers ?Overall transfer level:  Independent ?  ?  ?  ?  ?  ?  ?  ?  ?General transfer comment: use of log roll techniques ?  ? ?  ?Balance Overall balance assessment: No apparent balance deficits (not formally assessed) ?  ?  ?  ?  ?  ?  ?  ?  ?  ?  ?  ?  ?  ?  ?  ?  ?  ?  ?   ? ?ADL either performed or assessed with clinical judgement  ? ?ADL Overall ADL's : Modified independent ?  ?  ?  ?  ?  ?  ?  ?  ?  ?  ?  ?  ?  ?  ?  ?  ?  ?  ?  ?General ADL Comments: Pt performing ADLs and functional mobility with increased time as needed. Providing education on cervical precautions, grooming, brace management, bed mobility, UB ADLs, LB ADLs, toileting, tub transfer, and stair management.  ? ? ? ?Vision   ?   ?   ?Perception   ?  ?Praxis   ?  ? ?Pertinent Vitals/Pain Pain Assessment ?Pain Assessment: Faces ?Faces Pain Scale: Hurts a little bit ?Pain Location: Cervical ?Pain Descriptors / Indicators: Discomfort ?Pain Intervention(s): Monitored during session, Limited activity within patient's tolerance, Repositioned  ? ? ? ?Hand Dominance   ?  ?Extremity/Trunk Assessment Upper Extremity Assessment ?Upper Extremity Assessment: Overall WFL for tasks assessed (Slight numbness in finger tips; but greatly reduced) ?  ?Lower Extremity Assessment ?Lower Extremity Assessment: Overall WFL for tasks assessed ?  ?Cervical / Trunk Assessment ?Cervical / Trunk Assessment: Neck Surgery ?  ?Communication Communication ?Communication: No difficulties ?  ?Cognition  Arousal/Alertness: Awake/alert ?Behavior During Therapy: [facility] for tasks assessed/performed ?Overall Cognitive Status: Within Functional Limits for tasks assessed ?  ?  ?  ?  ?  ?  ?  ?  ?  ?  ?  ?  ?  ?  ?  ?  ?  ?  ?  ?General Comments    ? ?  ?Exercises Exercises: Other exercises ?Other Exercises ?Other Exercises: Provided handout for scapular traction ?  ?Shoulder Instructions    ? ? ?Home Living Family/patient expects to be discharged to:: Private residence ?Living Arrangements: Spouse/significant  other ?Available Help at Discharge: Family ?Type of Home: House ?Home Access: Stairs to enter ?Entrance Stairs-Number of Steps: 5 ?Entrance Stairs-Rails: Left ?Home Layout: One level ?  ?  ?Bathroom Shower/Tub: Tub/shower unit ?  ?Bathroom Toilet: Standard ?  ?  ?Home Equipment: None ?  ?  ?  ? ?  ?Prior Functioning/Environment Prior Level of Function : Independent/Modified Independent ?  ?  ?  ?  ?  ?  ?  ?  ?  ? ?  ?  ?OT Problem List: Decreased activity tolerance;Decreased knowledge of precautions ?  ?   ?OT Treatment/Interventions:    ?  ?OT Goals(Current goals can be found in the care plan section) Acute Rehab OT Goals ?Patient Stated Goal: Go home ?OT Goal Formulation: All assessment and education complete, DC therapy  ?OT Frequency:   ?  ? ?Co-evaluation   ?  ?  ?  ?  ? ?  ?AM-PAC OT "6 Clicks" Daily Activity     ?Outcome Measure Help from another person eating meals?: None ?Help from another person taking care of personal grooming?: None ?Help from another person toileting, which includes using toliet, bedpan, or urinal?: None ?Help from another person bathing (including washing, rinsing, drying)?: None ?Help from another person to put on and taking off regular upper body clothing?: None ?Help from another person to put on and taking off regular lower body clothing?: None ?6 Click Score: 24 ?  ?End of Session Equipment Utilized During Treatment: Cervical collar ?Nurse Communication: Mobility status ? ?Activity Tolerance: Patient tolerated treatment well ?Patient left: in chair;with call bell/phone within reach ? ?OT Visit Diagnosis: Muscle weakness (generalized) (M62.81)  ?              ?Time: 4497-5300 ?OT Time Calculation (min): 22 min ?Charges:  OT General Charges ?$OT Visit: 1 Visit ?OT Evaluation ?$OT Eval Low Complexity: 1 Low ? ?Macai Sisneros MSOT, OTR/L ?Acute Rehab ?Pager: [phone] ?Office: [phone] ? ?Ricquel Foulk M Avonelle Viveros ?09/21/2021, 8:53 AM ?

## 2021-09-21 NOTE — Plan of Care (Signed)
Pt doing well. Pt given D/C instructions with verbal understanding. Rx was sent to the pharmacy by MD. Pt's incision is clean and dry with no sign of infection. Pt's IV was removed prior to D/C. Pt D/C'd home via wheelchair per MD order. Pt is stable @ D/C and has no other needs at this time. Hutson Luft, RN  

## 2021-09-24 ENCOUNTER — Telehealth: Payer: Self-pay | Admitting: *Deleted

## 2021-09-24 NOTE — Telephone Encounter (Signed)
Transition Care Management Follow-up Telephone Call ?Date of discharge and from where: Angela [name] ?How have you been since you were released from the hospital? Feeling better ?Any questions or concerns? No ? ?Items Reviewed: ?Did the pt receive and understand the discharge instructions provided? Yes  ?Medications obtained and verified? Yes  ?Other? No  ?Any new allergies since your discharge? No  ?Dietary orders reviewed? No ?Do you have support at home? Yes  ? ?Home Care and Equipment/Supplies: ?Were home health services ordered?  ?If so, what is the name of the agency?   ?Has the agency set up a time to come to the patient's home?  ?Were any new equipment or medical supplies ordered?   ?What is the name of the medical supply agency?  ?Were you able to get the supplies/equipment?  ?Do you have any questions related to the use of the equipment or supplies?  ? ?Functional Questionnaire: (I = Independent and D = Dependent) ?ADLs: I ? ?Bathing/Dressing- I ? ?Meal Prep- I ? ?Eating- I ? ?Maintaining continence- I ? ?Transferring/Ambulation- I ? ?Managing Meds- I ? ?Follow up appointments reviewed: ? ?PCP Hospital f/u appt confirmed? No   ?No follow up needed at this time ?[facility] f/u appt confirmed? Yes  Scheduled to see 09-28-2021 will have sutures and xrays ?Are transportation arrangements needed? No  ?If their condition worsens, is the pt aware to call PCP or go to the Emergency Dept.? Yes ?Was the patient provided with contact information for the PCP's office or ED? Yes ?Was to pt encouraged to call back with questions or concerns? Yes  ?

## 2021-09-28 DIAGNOSIS — M5412 Radiculopathy, cervical region: Secondary | ICD-10-CM | POA: Diagnosis not present

## 2021-09-28 DIAGNOSIS — M47812 Spondylosis without myelopathy or radiculopathy, cervical region: Secondary | ICD-10-CM | POA: Diagnosis not present

## 2021-09-28 DIAGNOSIS — M4802 Spinal stenosis, cervical region: Secondary | ICD-10-CM | POA: Diagnosis not present

## 2021-09-28 DIAGNOSIS — Z6825 Body mass index (BMI) 25.0-25.9, adult: Secondary | ICD-10-CM | POA: Diagnosis not present

## 2021-11-01 ENCOUNTER — Encounter: Payer: Self-pay | Admitting: Family Medicine

## 2021-11-01 DIAGNOSIS — M159 Polyosteoarthritis, unspecified: Secondary | ICD-10-CM

## 2021-11-01 DIAGNOSIS — M150 Primary generalized (osteo)arthritis: Secondary | ICD-10-CM

## 2021-11-02 ENCOUNTER — Telehealth: Payer: Self-pay | Admitting: Family Medicine

## 2021-11-02 NOTE — Telephone Encounter (Signed)
Kieth Brightly calling from [employer], [employer] is calling to request office visit notes and labs. Attention [employer]- [phone] [phone]

## 2021-11-05 NOTE — Telephone Encounter (Signed)
Kieth Brightly called requesting update on request from 6/2

## 2021-11-06 NOTE — Telephone Encounter (Signed)
I have contacted Kieth Brightly and will send over labs and OV note she requested.

## 2021-11-07 ENCOUNTER — Ambulatory Visit (INDEPENDENT_AMBULATORY_CARE_PROVIDER_SITE_OTHER): Payer: Medicare PPO

## 2021-11-07 DIAGNOSIS — I442 Atrioventricular block, complete: Secondary | ICD-10-CM | POA: Diagnosis not present

## 2021-11-09 DIAGNOSIS — M4802 Spinal stenosis, cervical region: Secondary | ICD-10-CM | POA: Diagnosis not present

## 2021-11-09 DIAGNOSIS — Z6826 Body mass index (BMI) 26.0-26.9, adult: Secondary | ICD-10-CM | POA: Diagnosis not present

## 2021-11-09 LAB — CUP PACEART REMOTE DEVICE CHECK
Battery Remaining Longevity: 22 mo
Battery Voltage: 2.93 V
Brady Statistic AP VP Percent: 1.69 %
Brady Statistic AP VS Percent: 0.01 %
Brady Statistic AS VP Percent: 98.29 %
Brady Statistic AS VS Percent: 0.01 %
Brady Statistic RA Percent Paced: 1.69 %
Brady Statistic RV Percent Paced: 99.98 %
Date Time Interrogation Session: 20230608093217
Implantable Lead Implant Date: 20070319
Implantable Lead Implant Date: 20070319
Implantable Lead Location: 753859
Implantable Lead Location: 753860
Implantable Lead Model: 5076
Implantable Lead Model: 5076
Implantable Pulse Generator Implant Date: 20160210
Lead Channel Impedance Value: 285 Ohm
Lead Channel Impedance Value: 380 Ohm
Lead Channel Impedance Value: 437 Ohm
Lead Channel Impedance Value: 437 Ohm
Lead Channel Pacing Threshold Amplitude: 0.625 V
Lead Channel Pacing Threshold Amplitude: 1 V
Lead Channel Pacing Threshold Pulse Width: 0.4 ms
Lead Channel Pacing Threshold Pulse Width: 0.4 ms
Lead Channel Sensing Intrinsic Amplitude: 2.25 mV
Lead Channel Sensing Intrinsic Amplitude: 2.25 mV
Lead Channel Sensing Intrinsic Amplitude: 3.75 mV
Lead Channel Sensing Intrinsic Amplitude: 3.75 mV
Lead Channel Setting Pacing Amplitude: 2 V
Lead Channel Setting Pacing Amplitude: 2.5 V
Lead Channel Setting Pacing Pulse Width: 0.4 ms
Lead Channel Setting Sensing Sensitivity: 2 mV

## 2021-11-14 DIAGNOSIS — M79642 Pain in left hand: Secondary | ICD-10-CM | POA: Diagnosis not present

## 2021-11-14 DIAGNOSIS — M79641 Pain in right hand: Secondary | ICD-10-CM | POA: Diagnosis not present

## 2021-11-14 DIAGNOSIS — M2550 Pain in unspecified joint: Secondary | ICD-10-CM | POA: Diagnosis not present

## 2021-11-14 DIAGNOSIS — M79671 Pain in right foot: Secondary | ICD-10-CM | POA: Diagnosis not present

## 2021-11-14 DIAGNOSIS — M549 Dorsalgia, unspecified: Secondary | ICD-10-CM | POA: Diagnosis not present

## 2021-11-14 DIAGNOSIS — M1990 Unspecified osteoarthritis, unspecified site: Secondary | ICD-10-CM | POA: Diagnosis not present

## 2021-11-14 DIAGNOSIS — M79643 Pain in unspecified hand: Secondary | ICD-10-CM | POA: Diagnosis not present

## 2021-11-14 DIAGNOSIS — M25562 Pain in left knee: Secondary | ICD-10-CM | POA: Diagnosis not present

## 2021-11-14 DIAGNOSIS — M25561 Pain in right knee: Secondary | ICD-10-CM | POA: Diagnosis not present

## 2021-11-14 DIAGNOSIS — M79672 Pain in left foot: Secondary | ICD-10-CM | POA: Diagnosis not present

## 2021-11-16 NOTE — Progress Notes (Signed)
Remote pacemaker transmission.   

## 2021-12-20 DIAGNOSIS — M1990 Unspecified osteoarthritis, unspecified site: Secondary | ICD-10-CM | POA: Diagnosis not present

## 2021-12-20 DIAGNOSIS — M79643 Pain in unspecified hand: Secondary | ICD-10-CM | POA: Diagnosis not present

## 2021-12-20 DIAGNOSIS — Z79899 Other long term (current) drug therapy: Secondary | ICD-10-CM | POA: Diagnosis not present

## 2021-12-20 DIAGNOSIS — M549 Dorsalgia, unspecified: Secondary | ICD-10-CM | POA: Diagnosis not present

## 2021-12-20 DIAGNOSIS — M2550 Pain in unspecified joint: Secondary | ICD-10-CM | POA: Diagnosis not present

## 2022-01-08 ENCOUNTER — Encounter: Payer: Self-pay | Admitting: Family Medicine

## 2022-01-08 ENCOUNTER — Ambulatory Visit: Payer: Medicare PPO | Admitting: Family Medicine

## 2022-01-08 VITALS — BP 104/69 | HR 87 | Temp 98.5°F | Wt 157.1 lb

## 2022-01-08 DIAGNOSIS — F3342 Major depressive disorder, recurrent, in full remission: Secondary | ICD-10-CM

## 2022-01-08 DIAGNOSIS — I10 Essential (primary) hypertension: Secondary | ICD-10-CM

## 2022-01-08 DIAGNOSIS — F339 Major depressive disorder, recurrent, unspecified: Secondary | ICD-10-CM

## 2022-01-08 MED ORDER — VENLAFAXINE HCL ER 150 MG PO CP24: 150.0000 mg | ORAL_CAPSULE | Freq: Every day | ORAL | 1 refills | Status: DC

## 2022-01-08 MED ORDER — VENLAFAXINE HCL ER 75 MG PO CP24: ORAL_CAPSULE | ORAL | 1 refills | Status: DC

## 2022-01-08 NOTE — Assessment & Plan Note (Signed)
Under good control on current regimen. Continue current regimen. Continue to monitor. Call with any concerns. Labs drawn today.  

## 2022-01-08 NOTE — Assessment & Plan Note (Signed)
Under good control on current regimen. Continue current regimen. Continue to monitor. Call with any concerns. Refills given.   

## 2022-01-08 NOTE — Progress Notes (Signed)
BP 104/69   Pulse 87   Temp 98.5 F (36.9 C)   Wt 157 lb 1.6 oz (71.3 kg)   SpO2 98%   BMI 26.97 kg/m    Subjective:    Patient ID: Angela [name], female    DOB: 01/29/1971, 51 y.o.   MRN: [id number]  HPI: Angela [name] is a 51 y.o. female  Chief Complaint  Patient presents with   Hypertension   Depression   OCD   Ear Pain    Patient states her right ear has been hurting for a few days.    HYPERTENSION Hypertension status: controlled  Satisfied with current treatment? yes Duration of hypertension: chronic BP monitoring frequency:  not checking BP medication side effects:  no Medication compliance:  not on anything Previous BP meds:none Aspirin: yes Recurrent headaches: no Visual changes: no Palpitations: no Dyspnea: no Chest pain: no Lower extremity edema: no Dizzy/lightheaded: yes  DEPRESSION Mood status: better Satisfied with current treatment?: yes Symptom severity: mild  Duration of current treatment : chronic Side effects: no Medication compliance: excellent compliance Psychotherapy/counseling: no  Previous psychiatric medications: effexor Depressed mood: no Anxious mood: no Anhedonia: no Significant weight loss or gain: no Insomnia: no  Fatigue: yes Feelings of worthlessness or guilt: no Impaired concentration/indecisiveness: no Suicidal ideations: no Hopelessness: no Crying spells: no    01/08/2022    9:04 AM 07/31/2021    9:27 AM 07/11/2021    9:34 AM 04/17/2021    3:22 PM 03/08/2021    8:46 AM  Depression screen PHQ 2/9  Decreased Interest '1 1 1 '$ 0 0  Down, Depressed, Hopeless '1 1 1 '$ 0 0  PHQ - 2 Score '2 2 2 '$ 0 0  Altered sleeping '2 1 3 2 1  '$ Tired, decreased energy '1 2 2 1 3  '$ Change in appetite 0 0 0 0 0  Feeling bad or failure about yourself  0 0 0 0   Trouble concentrating 1 0 2 2 0  Moving slowly or fidgety/restless 0 0 0 0 0  Suicidal thoughts 0 0 0 0 0  PHQ-9 Score '6 5 9 5 4  '$ Difficult doing work/chores Somewhat difficult  Not difficult at all Not difficult at all  Somewhat difficult    Relevant past medical, surgical, family and social history reviewed and updated as indicated. Interim medical history since our last visit reviewed. Allergies and medications reviewed and updated.  Review of Systems  Constitutional: Negative.   Respiratory: Negative.    Cardiovascular: Negative.   Gastrointestinal: Negative.   Musculoskeletal:  Positive for arthralgias. Negative for back pain, gait problem, joint swelling, myalgias, neck pain and neck stiffness.  Skin: Negative.   Psychiatric/Behavioral: Negative.      Per HPI unless specifically indicated above     Objective:    BP 104/69   Pulse 87   Temp 98.5 F (36.9 C)   Wt 157 lb 1.6 oz (71.3 kg)   SpO2 98%   BMI 26.97 kg/m   Wt Readings from Last 3 Encounters:  01/08/22 157 lb 1.6 oz (71.3 kg)  09/20/21 148 lb (67.1 kg)  09/14/21 148 lb 11.2 oz (67.4 kg)    Physical Exam Vitals and nursing note reviewed.  Constitutional:      General: She is not in acute distress.    Appearance: Normal appearance. She is not ill-appearing, toxic-appearing or diaphoretic.  HENT:     Head: Normocephalic and atraumatic.     Right Ear: External ear normal.  Left Ear: External ear normal.     Nose: Nose normal.     Mouth/Throat:     Mouth: Mucous membranes are moist.     Pharynx: Oropharynx is clear.  Eyes:     General: No scleral icterus.       Right eye: No discharge.        Left eye: No discharge.     Extraocular Movements: Extraocular movements intact.     Conjunctiva/sclera: Conjunctivae normal.     Pupils: Pupils are equal, round, and reactive to light.  Cardiovascular:     Rate and Rhythm: Normal rate and regular rhythm.     Pulses: Normal pulses.     Heart sounds: Normal heart sounds. No murmur heard.    No friction rub. No gallop.  Pulmonary:     Effort: Pulmonary effort is normal. No respiratory distress.     Breath sounds: Normal breath  sounds. No stridor. No wheezing, rhonchi or rales.  Chest:     Chest wall: No tenderness.  Musculoskeletal:        General: Normal range of motion.     Cervical back: Normal range of motion and neck supple.  Skin:    General: Skin is warm and dry.     Capillary Refill: Capillary refill takes less than 2 seconds.     Coloration: Skin is not jaundiced or pale.     Findings: No bruising, erythema, lesion or rash.  Neurological:     General: No focal deficit present.     Mental Status: She is alert and oriented to person, place, and time. Mental status is at baseline.  Psychiatric:        Mood and Affect: Mood normal.        Behavior: Behavior normal.        Thought Content: Thought content normal.        Judgment: Judgment normal.     Results for orders placed or performed in visit on 11/07/21  CUP PACEART REMOTE DEVICE CHECK  Result Value Ref Range   Date Time Interrogation Session [id number]    Pulse Generator Manufacturer MERM    Pulse Gen Model [id number] Advisa DR MRI    Pulse Gen Serial Number [id number]    Clinic Name [facility]    Implantable Pulse Generator Type Implantable Pulse Generator    Implantable Pulse Generator Implant Date [id number]    Implantable Lead Manufacturer MERM    Implantable Lead Model 5076 CapSureFix Novus    Implantable Lead Serial Number [id number]    Implantable Lead Implant Date [id number]    Implantable Lead Location Detail 1 APPENDAGE    Implantable Lead Location [id number]    Implantable Lead Manufacturer MERM    Implantable Lead Model 5076 CapSureFix Novus    Implantable Lead Serial Number [id number]    Implantable Lead Implant Date [id number]    Implantable Lead Location Detail 1 APEX    Implantable Lead Location [id number]    Lead Channel Setting Sensing Sensitivity 2 mV   Lead Channel Setting Pacing Amplitude 2 V   Lead Channel Setting Pacing Pulse Width 0.4 ms   Lead Channel Setting Pacing Amplitude 2.5 V   Lead Channel Impedance Value 437  ohm   Lead Channel Impedance Value 285 ohm   Lead Channel Sensing Intrinsic Amplitude 2.25 mV   Lead Channel Sensing Intrinsic Amplitude 2.25 mV   Lead Channel Pacing Threshold Amplitude 1 V   Lead Channel Pacing Threshold Pulse Width 0.4 ms  Lead Channel Impedance Value 437 ohm   Lead Channel Impedance Value 380 ohm   Lead Channel Sensing Intrinsic Amplitude 3.75 mV   Lead Channel Sensing Intrinsic Amplitude 3.75 mV   Lead Channel Pacing Threshold Amplitude 0.625 V   Lead Channel Pacing Threshold Pulse Width 0.4 ms   Battery Status OK    Battery Remaining Longevity 22 mo   Battery Voltage 2.93 V   Brady Statistic RA Percent Paced 1.69 %   Brady Statistic RV Percent Paced 99.98 %   Brady Statistic AP VP Percent 1.69 %   Brady Statistic AS VP Percent 98.29 %   Brady Statistic AP VS Percent 0.01 %   Brady Statistic AS VS Percent 0.01 %      Assessment & Plan:   Problem List Items Addressed This Visit       Cardiovascular and Mediastinum   Hypertension - Primary    Under good control on current regimen. Continue current regimen. Continue to monitor. Call with any concerns. Labs drawn today.       Relevant Orders   Basic metabolic panel     Other   Depression, recurrent ([place])    Under good control on current regimen. Continue current regimen. Continue to monitor. Call with any concerns. Refills given.        Relevant Medications   venlafaxine XR (EFFEXOR-XR) 150 MG 24 hr capsule   venlafaxine XR (EFFEXOR XR) 75 MG 24 hr capsule   Other Visit Diagnoses     Recurrent major depressive disorder, in full remission (HCC)       Relevant Medications   venlafaxine XR (EFFEXOR-XR) 150 MG 24 hr capsule   venlafaxine XR (EFFEXOR XR) 75 MG 24 hr capsule        Follow up plan: Return in about 6 months (around 07/11/2022) for physical.

## 2022-01-09 LAB — BASIC METABOLIC PANEL
BUN/Creatinine Ratio: 17 (ref 9–23)
BUN: 15 mg/dL (ref 6–24)
CO2: 23 mmol/L (ref 20–29)
Calcium: 9.5 mg/dL (ref 8.7–10.2)
Chloride: 100 mmol/L (ref 96–106)
Creatinine, Ser: 0.88 mg/dL (ref 0.57–1.00)
Glucose: 67 mg/dL — ABNORMAL LOW (ref 70–99)
Potassium: 4.6 mmol/L (ref 3.5–5.2)
Sodium: 137 mmol/L (ref 134–144)
eGFR: 80 mL/min/{1.73_m2} (ref >59)

## 2022-01-29 ENCOUNTER — Emergency Department: Payer: Medicare PPO

## 2022-01-29 ENCOUNTER — Other Ambulatory Visit: Payer: Self-pay

## 2022-01-29 ENCOUNTER — Telehealth: Payer: Self-pay

## 2022-01-29 ENCOUNTER — Emergency Department
Admission: EM | Admit: 2022-01-29 | Discharge: 2022-01-29 | Disposition: A | Discharge status: Home / Self Care | Payer: Medicare PPO | Attending: Emergency Medicine | Admitting: Emergency Medicine

## 2022-01-29 ENCOUNTER — Encounter: Payer: Self-pay | Admitting: Internal Medicine

## 2022-01-29 DIAGNOSIS — R0789 Other chest pain: Secondary | ICD-10-CM | POA: Diagnosis not present

## 2022-01-29 DIAGNOSIS — Z950 Presence of cardiac pacemaker: Secondary | ICD-10-CM | POA: Insufficient documentation

## 2022-01-29 DIAGNOSIS — R079 Chest pain, unspecified: Secondary | ICD-10-CM | POA: Insufficient documentation

## 2022-01-29 LAB — BASIC METABOLIC PANEL
Anion gap: 5 (ref 5–15)
BUN: 19 mg/dL (ref 6–20)
CO2: 24 mmol/L (ref 22–32)
Calcium: 9.2 mg/dL (ref 8.9–10.3)
Chloride: 108 mmol/L (ref 98–111)
Creatinine, Ser: 0.83 mg/dL (ref 0.44–1.00)
GFR, Estimated: >60 mL/min (ref >60)
Glucose, Bld: 103 mg/dL — ABNORMAL HIGH (ref 70–99)
Potassium: 4.3 mmol/L (ref 3.5–5.1)
Sodium: 137 mmol/L (ref 135–145)

## 2022-01-29 LAB — CBC
HCT: 43.9 % (ref 36.0–46.0)
Hemoglobin: 14.9 g/dL (ref 12.0–15.0)
MCH: 31.5 pg (ref 26.0–34.0)
MCHC: 33.9 g/dL (ref 30.0–36.0)
MCV: 92.8 fL (ref 80.0–100.0)
Platelets: 251 10*3/uL (ref 150–400)
RBC: 4.73 MIL/uL (ref 3.87–5.11)
RDW: 11.7 % (ref 11.5–15.5)
WBC: 6.5 10*3/uL (ref 4.0–10.5)
nRBC: 0 % (ref 0.0–0.2)

## 2022-01-29 LAB — TROPONIN I (HIGH SENSITIVITY)
Troponin I (High Sensitivity): 3 ng/L (ref <18)
Troponin I (High Sensitivity): 3 ng/L (ref <18)

## 2022-01-29 NOTE — ED Triage Notes (Signed)
Pt began having chest pain last night that she thought was just anxiety. Pt took her prn anxiety meds and went to sleep, at 2am pt woke up with chest pain in right chest that goes into her back. Pt does have a heart hx and a pacemaker.

## 2022-01-29 NOTE — Telephone Encounter (Signed)
Spoke with patient who reports mid sternal chest pain that has been intermittent over the last few weeks but starting last night as not resolved.   She denies being short of breath but states she sometimes feels need to take a deep breath. Denies N/V. She has not taken nitro (she does not have any). She also reports feeling more tired last few days. Her BP is 144/92 and HR 107. She is usually 120/80s and HR 70s.   Advised her to go to ED for evaluation. Provided education on CP and possible causes. She is agreeable to going to ED and plans to go now.

## 2022-01-29 NOTE — ED Provider Notes (Signed)
[facility] Provider Note   Event Date/Time   First MD Initiated Contact with Patient 01/29/22 1503     (approximate) History  Chest Pain  HPI Angela [name] is a 51 y.o. female with a stated past medical history of cardiac pacemaker who presents for right-sided chest pain and anxiety over the last 24 hours.  Patient states that this pain is somewhat normal when she is having anxiety however she took her antianxiety meds and the chest pain still woke her up at approximately 2 AM last night.  Patient states that this pain is 5/10, right-sided, and radiating through to the back.  Patient denies any associated shortness of breath or exertional worsening. ROS: Patient currently denies any vision changes, tinnitus, difficulty speaking, facial droop, sore throat, shortness of breath, abdominal pain, nausea/vomiting/diarrhea, dysuria, or weakness/numbness/paresthesias in any extremity   Physical Exam  Triage Vital Signs: ED Triage Vitals  Enc Vitals Group     BP 01/29/22 1235 (!) 135/91     Pulse Rate 01/29/22 1235 92     Resp 01/29/22 1235 17     Temp 01/29/22 1235 98.4 F (36.9 C)     Temp Source 01/29/22 1235 Oral     SpO2 01/29/22 1235 100 %     Weight 01/29/22 1237 154 lb (69.9 kg)     Height 01/29/22 1237 '5\' 4"'$  (1.626 m)     Head Circumference --      Peak Flow --      Pain Score 01/29/22 1235 5     Pain Loc --      Pain Edu? --      Excl. in [place]? --    Most recent vital signs: Vitals:   01/29/22 1649 01/29/22 1649  BP: 137/89 130/88  Pulse: 89 80  Resp: 16 16  Temp: 98.5 F (36.9 C) 98 F (36.7 C)  SpO2: 98% 98%   General: Awake, oriented x4. CV:  Good peripheral perfusion.  Resp:  Normal effort.  Abd:  No distention.  Other:  Middle-aged overweight Caucasian female laying in bed in no acute distress ED Results / Procedures / Treatments  Labs (all labs ordered are listed, but only abnormal results are displayed) Labs Reviewed  BASIC  METABOLIC PANEL - Abnormal; Notable for the following components:      Result Value   Glucose, Bld 103 (*)    All other components within normal limits  CBC  POC URINE PREG, ED  TROPONIN I (HIGH SENSITIVITY)  TROPONIN I (HIGH SENSITIVITY)   EKG ED ECG REPORT I, Naaman Plummer, the attending physician, personally viewed and interpreted this ECG. Date: 01/29/2022 EKG Time: 1229 Rate: 105 Rhythm: Ventricular paced rhythm QRS Axis: normal Intervals: normal ST/T Wave abnormalities: normal Narrative Interpretation: Ventricular paced rhythm.  No evidence of acute ischemia RADIOLOGY ED MD interpretation: 2 view chest x-ray interpreted by me shows no evidence of acute abnormalities including no pneumonia, pneumothorax, or widened mediastinum -Agree with radiology assessment Official radiology report(s): DG Chest 2 View  Result Date: 01/29/2022 CLINICAL DATA:  Chest pain EXAM: CHEST - 2 VIEW COMPARISON:  04/03/2012 FINDINGS: The lungs are clear without focal pneumonia, edema, pneumothorax or pleural effusion. The cardiopericardial silhouette is within normal limits for size. Left-sided permanent pacemaker noted. IMPRESSION: No active cardiopulmonary disease. Electronically Signed   By: Misty Stanley M.D.   On: 01/29/2022 13:36   PROCEDURES: Critical Care performed: No Procedures MEDICATIONS ORDERED IN ED: Medications - No data to  display IMPRESSION / MDM / ASSESSMENT AND PLAN / ED COURSE  I reviewed the triage vital signs and the nursing notes.                             The patient is on the cardiac monitor to evaluate for evidence of arrhythmia and/or significant heart rate changes. Patient's presentation is most consistent with acute presentation with potential threat to life or bodily function. Workup: ECG, CXR, CBC, BMP, Troponin Findings: ECG: No overt evidence of STEMI. No evidence of Brugadas sign, delta wave, epsilon wave, significantly prolonged QTc, or malignant  arrhythmia HS Troponin: Negative x1 Other Labs unremarkable for emergent problems. CXR: Without PTX, PNA, or widened mediastinum Last Stress Test: Denies Last Heart Catheterization: Denies HEART Score: 4  Given History, Exam, and Workup I have low suspicion for ACS, Pneumothorax, Pneumonia, Pulmonary Embolus, Tamponade, Aortic Dissection or other emergent problem as a cause for this presentation.   Reassesment: Prior to discharge patients pain was controlled and they were well appearing.  Disposition:  Discharge. Strict return precautions discussed with patient with full understanding. Advised patient to follow up promptly with primary care provider     FINAL CLINICAL IMPRESSION(S) / ED DIAGNOSES   Final diagnoses:  Right-sided chest pain  Cardiac pacemaker in situ   Rx / DC Orders   ED Discharge Orders          Ordered    Ambulatory referral to Cardiology       Comments: If you have not heard from the Cardiology office within the next 72 hours please call [phone].   01/29/22 1644           Note:  This document was prepared using Dragon voice recognition software and may include unintentional dictation errors.   Naaman Plummer, MD 01/29/22 2150

## 2022-01-29 NOTE — ED Notes (Signed)
Pacemaker interrogated per Dr. Cheri Alison Breeding request

## 2022-01-29 NOTE — ED Provider Triage Note (Signed)
Emergency Medicine Provider Triage Evaluation Note  Angela [name], a 51 y.o. female with a history of AV block, implanted pacemaker, mitral valve disease, hypertension, A-fib, and POTS was evaluated in triage.  Pt complains of chest pain.  She reports onset of symptoms last night which she thought was anxiety.  She took her as needed anxiety medicines and went to sleep and she woke up again at 2 AM with persistent chest pain with referral into her mid back.  Review of Systems  Positive: CP Negative: SOB  Physical Exam  BP (!) 135/91 (BP Location: Left Arm)   Pulse 92   Temp 98.4 F (36.9 C) (Oral)   Resp 17   Ht '5\' 4"'$  (1.626 m)   Wt 69.9 kg   SpO2 100%   BMI 26.43 kg/m  Gen:   Awake, no distress NAD Resp:  Normal effort CTA MSK:   Moves extremities without difficulty  Other:    Medical Decision Making  Medically screening exam initiated at 12:49 PM.  Appropriate orders placed.  Angela [name] was informed that the remainder of the evaluation will be completed by another provider, this initial triage assessment does not replace that evaluation, and the importance of remaining in the ED until their evaluation is complete.  Patient with significant cardiac history including implanted pacemaker, presents to the ED with chest pain with onset last night.   Melvenia Needles, PA-C 01/29/22 1250

## 2022-01-29 NOTE — ED Notes (Signed)
Discharge instructions reviewed with patient. Patient questions answered and opportunity for education reviewed. Patient voices understanding of discharge instructions with no further questions. Patient ambulatory with steady gait to lobby.  

## 2022-01-30 ENCOUNTER — Telehealth: Payer: Self-pay | Admitting: *Deleted

## 2022-01-30 ENCOUNTER — Telehealth: Payer: Self-pay | Admitting: Internal Medicine

## 2022-01-30 NOTE — Telephone Encounter (Signed)
Spoke with patient who states she wants to know it Dr Caryl Comes would still be interested in giving her an Alpha Blocker. She was seen in ED for CP on 08/29/21 and cleared. She is aware Dr. Caryl Comes is out of the office until after 02/04/22 and understands she wont hear back until that time.

## 2022-01-30 NOTE — Telephone Encounter (Signed)
Pt called stating that provider mentioned that a Alpha Blocker may help. I so pt would like for medication to go ahead and be called into pharmacy. Please advise

## 2022-01-30 NOTE — Telephone Encounter (Signed)
Transition Care Management Follow-up Telephone Call Date of discharge and from where: [facility] 01-29-2022 How have you been since you were released from the hospital? Still having some discomfort but relieved it was heart attack Any questions or concerns? No  Items Reviewed: Did the pt receive and understand the discharge instructions provided? Yes  Medications obtained and verified?  Other?    Any new allergies since your discharge? No  Dietary orders reviewed?  Do you have support at home? Yes   Home Care and Equipment/Supplies: Were home health services ordered?  If so, what is the name of the agency?   Has the agency set up a time to come to the patient's home?  Were any new equipment or medical supplies ordered?  What is the name of the medical supply agency?  Were you able to get the supplies/equipment?  Do you have any questions related to the use of the equipment or supplies?   Functional Questionnaire: (I = Independent and D = Dependent) ADLs: I  Bathing/Dressing- I  Meal Prep- I  Eating- I  Maintaining continence- I  Transferring/Ambulation- I  Managing Meds- I  Follow up appointments reviewed:  PCP Hospital f/u appt confirmed?  As needed [facility] f/u appt confirmed? no Scheduled to see   Dr. Lequita Halt  cardioloy on Are transportation arrangements needed? No  If their condition worsens, is the pt aware to call PCP or go to the Emergency Dept.? Yes Was the patient provided with contact information for the PCP's office or ED? Yes Was to pt encouraged to call back with questions or concerns? Yes

## 2022-01-31 NOTE — Telephone Encounter (Signed)
The patient is due for ov/device check and is scheduled 02/28/22 with Dr. Caryl Comes.

## 2022-02-03 ENCOUNTER — Other Ambulatory Visit: Payer: Self-pay | Admitting: Family Medicine

## 2022-02-03 DIAGNOSIS — F3289 Other specified depressive episodes: Secondary | ICD-10-CM

## 2022-02-05 NOTE — Telephone Encounter (Signed)
Requested medications are due for refill today.  yes  Requested medications are on the active medications list.  yes  Last refill. 07/11/2021 #45 0 refills  Future visit scheduled.   yes  Notes to clinic.  Refill not delegated.    Requested Prescriptions  Pending Prescriptions Disp Refills   LORazepam (ATIVAN) 0.5 MG tablet [Pharmacy Med Name: LORAZEPAM 0.'5MG'$  TABLETS] 45 tablet     Sig: TAKE 1 TO 2 TABLETS(0.5 TO 1 MG) BY MOUTH DAILY AS NEEDED FOR ANXIETY     Not Delegated - Psychiatry: Anxiolytics/Hypnotics 2 Failed - 02/03/2022  9:46 AM      Failed - This refill cannot be delegated      Failed - Urine Drug Screen completed in last 360 days      Passed - Patient is not pregnant      Passed - Valid encounter within last 6 months    Recent Outpatient Visits           4 weeks ago Primary hypertension   [place], Megan P, DO   6 months ago Routine general medical examination at a health care facility   [facility] [place] P, DO   9 months ago Breast lump on left side at 2 o'clock position   [facility], Megan P, DO   11 months ago Needs flu shot   [place], [place], DO   1 year ago Depression, recurrent [facility])   [place], [place], DO       Future Appointments             In 5 months Johnson, Barb Merino, DO [employer]

## 2022-02-06 ENCOUNTER — Ambulatory Visit (INDEPENDENT_AMBULATORY_CARE_PROVIDER_SITE_OTHER): Payer: Medicare PPO

## 2022-02-06 DIAGNOSIS — I442 Atrioventricular block, complete: Secondary | ICD-10-CM

## 2022-02-12 LAB — CUP PACEART REMOTE DEVICE CHECK
Battery Remaining Longevity: 19 mo
Battery Voltage: 2.91 V
Brady Statistic AP VP Percent: 0.36 %
Brady Statistic AP VS Percent: 0 %
Brady Statistic AS VP Percent: 99.63 %
Brady Statistic AS VS Percent: 0.01 %
Brady Statistic RA Percent Paced: 0.36 %
Brady Statistic RV Percent Paced: 99.98 %
Date Time Interrogation Session: 20230907114310
Implantable Lead Implant Date: 20070319
Implantable Lead Implant Date: 20070319
Implantable Lead Location: 753859
Implantable Lead Location: 753860
Implantable Lead Model: 5076
Implantable Lead Model: 5076
Implantable Pulse Generator Implant Date: 20160210
Lead Channel Impedance Value: 285 Ohm
Lead Channel Impedance Value: 399 Ohm
Lead Channel Impedance Value: 456 Ohm
Lead Channel Impedance Value: 456 Ohm
Lead Channel Pacing Threshold Amplitude: 0.75 V
Lead Channel Pacing Threshold Amplitude: 1 V
Lead Channel Pacing Threshold Pulse Width: 0.4 ms
Lead Channel Pacing Threshold Pulse Width: 0.4 ms
Lead Channel Sensing Intrinsic Amplitude: 20.375 mV
Lead Channel Sensing Intrinsic Amplitude: 20.375 mV
Lead Channel Sensing Intrinsic Amplitude: 3.375 mV
Lead Channel Sensing Intrinsic Amplitude: 3.375 mV
Lead Channel Setting Pacing Amplitude: 2 V
Lead Channel Setting Pacing Amplitude: 2.5 V
Lead Channel Setting Pacing Pulse Width: 0.4 ms
Lead Channel Setting Sensing Sensitivity: 2 mV

## 2022-02-20 DIAGNOSIS — Z79899 Other long term (current) drug therapy: Secondary | ICD-10-CM | POA: Diagnosis not present

## 2022-02-20 DIAGNOSIS — H11042 Peripheral pterygium, stationary, left eye: Secondary | ICD-10-CM | POA: Diagnosis not present

## 2022-02-20 DIAGNOSIS — H5213 Myopia, bilateral: Secondary | ICD-10-CM | POA: Diagnosis not present

## 2022-02-25 NOTE — Progress Notes (Signed)
Remote pacemaker transmission.   

## 2022-02-28 ENCOUNTER — Ambulatory Visit: Payer: Medicare PPO | Attending: Internal Medicine | Admitting: Internal Medicine

## 2022-02-28 ENCOUNTER — Encounter: Payer: Self-pay | Admitting: Internal Medicine

## 2022-02-28 VITALS — BP 127/87 | HR 83 | Ht 64.0 in | Wt 159.0 lb

## 2022-02-28 DIAGNOSIS — I471 Supraventricular tachycardia: Secondary | ICD-10-CM

## 2022-02-28 DIAGNOSIS — G90A Postural orthostatic tachycardia syndrome (POTS): Secondary | ICD-10-CM | POA: Diagnosis not present

## 2022-02-28 DIAGNOSIS — Z950 Presence of cardiac pacemaker: Secondary | ICD-10-CM

## 2022-02-28 DIAGNOSIS — M4800 Spinal stenosis, site unspecified: Secondary | ICD-10-CM | POA: Insufficient documentation

## 2022-02-28 DIAGNOSIS — M1990 Unspecified osteoarthritis, unspecified site: Secondary | ICD-10-CM | POA: Insufficient documentation

## 2022-02-28 DIAGNOSIS — I442 Atrioventricular block, complete: Secondary | ICD-10-CM

## 2022-02-28 DIAGNOSIS — I4719 Other supraventricular tachycardia: Secondary | ICD-10-CM

## 2022-02-28 MED ORDER — DILTIAZEM HCL ER COATED BEADS 120 MG PO CP24: 120.0000 mg | ORAL_CAPSULE | Freq: Every day | ORAL | 3 refills | Status: DC

## 2022-02-28 NOTE — Progress Notes (Signed)
Patient Care Team: Valerie Roys, DO as PCP - General (Family Medicine)   HPI  Angela [name] is a 51 y.o. 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   Because of some degree of atrial tachycardia/sinus tachycardia we have tried reprogrammed her device DDD--DDI. She did not tolerate this at all. We subsequently programmed her back with a lower upper tracking rate  She underwent device revision surgery 2/16  having reverted to VVI and felt terrible.  She is much improved. The heat however remains a problem. Lower extremity edema is issue also.    Had lost a bunch of weight, unfortunately he has put on much of it.  But still walking.  4 miles a day.  Recently with chest discomfort particularly since discontinuing the beta-blocker.  Raynaud's is much better.  Toes and fingers feel much better.  Mental health is much improved.  Son has graduated from [employer] Daughter about to graduate from [employer] TEST EF   10/21 Echo  55-60%         Date Cr K Hgb  8/23 0.83 4.3 14.9           Past Medical History:  Diagnosis Date   Arthritis 2016   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 EF 50-55% normal coronaries   Complete heart block (HCC)    Depression    Dysautonomia (HCC)    Dyspnea    CT negative for PE, 2007   Hypertension    Insomnia    OCD (obsessive compulsive disorder)    SVT (supraventricular tachycardia) (HCC)    s/p ablation a. c/b AV nod ablation requiring pacemaker    Past Surgical History:  Procedure Laterality Date   ANTERIOR CERVICAL DECOMP/DISCECTOMY FUSION N/A 09/20/2021   Procedure: Anterior Cervical Discectomy Fusion  Cervical Four-Five, Cervical Five-Six, Cervical Six-Seven;  Surgeon: Dawley, Theodoro Doing, DO;   Location: [place];  Service: Neurosurgery;  Laterality: N/A;  3C   BREAST BIOPSY Right 1998   BREAST BIOPSY Right 2004   BREAST LUMPECTOMY     RIGHT -BREAST    COLONOSCOPY WITH PROPOFOL N/A 09/07/2019   Procedure: COLONOSCOPY WITH PROPOFOL;  Surgeon: Lucilla Lame, MD;  Location: [facility];  Service: Endoscopy;  Laterality: N/A;   ENDOMETRIAL ABLATION  2005   INSERT / REPLACE / REMOVE PACEMAKER     PELVIC LAPAROSCOPY  1990   PERMANENT PACEMAKER GENERATOR CHANGE N/A 07/13/2014   MDT MRI compatible dual chamber pacemaker implanted by Dr Caryl Comes   [facility]  2005   TUBAL LIGATION  2003    Current Outpatient Medications  Medication Sig Dispense Refill   aspirin EC 81 MG tablet Take 81 mg by mouth daily. Swallow whole.     celecoxib (CELEBREX) 100 MG capsule Take 100 mg by mouth 2 (two) times daily.     cyclobenzaprine (FLEXERIL) 5 MG tablet Take 5 mg by mouth 3 (three) times daily as needed for muscle spasms.     FIBER ADULT GUMMIES PO Take 2 each by mouth daily. Vita Fusion Fiberwell     hydroxychloroquine (PLAQUENIL) 200 MG tablet Take 400 mg by mouth daily.     LORazepam (ATIVAN) 0.5 MG tablet TAKE  1 TO 2 TABLETS(0.5 TO 1 MG) BY MOUTH DAILY AS NEEDED FOR ANXIETY 45 tablet 0   methocarbamol (ROBAXIN) 500 MG tablet Take 500 mg by mouth 3 (three) times daily as needed.     Multiple Vitamins-Minerals (MULTIVITAMIN WITH MINERALS) tablet Take 1 tablet by mouth daily.     traMADol (ULTRAM) 50 MG tablet Take 50 mg by mouth every 6 (six) hours as needed.     venlafaxine XR (EFFEXOR XR) 75 MG 24 hr capsule Take 1 tab daily with her '150mg'$  for a total of '225mg'$  daily 90 capsule 1   venlafaxine XR (EFFEXOR-XR) 150 MG 24 hr capsule Take 1 capsule (150 mg total) by mouth daily with breakfast. 90 capsule 1   No current facility-administered medications for this visit.    Allergies  Allergen Reactions   Toprol Xl [Metoprolol Succinate] Photosensitivity    Review of Systems negative except from HPI and  PMH BP 127/87   Pulse 83   Ht '5\' 4"'$  (1.626 m)   Wt 159 lb (72.1 kg)   SpO2 99%   BMI 27.29 kg/m   Well developed and well nourished in no acute distress HENT normal Neck supple with JVP-flat Clear Device pocket well healed; without hematoma or erythema.  There is no tethering  Regular rate and rhythm, no  murmur Abd-soft with active BS No Clubbing cyanosis  edema Skin-warm and dry A & Oriented  Grossly normal sensory and motor function  ECG sinus P-synchronous/ AV  pacing       Assessment and  Plan  Complete heart block  Pacemaker Medtronic     Chest pain-exertional-atypical  POTS  Obesity  Anxiety/depression    Raynaud's  Atrial tachycardia/  Elevated blood pressure  Ventricular sensed events question mechanism     she is doing exceedingly well.  Walking a few miles a day  Will get calcium score  With recurrent chest pain probably INOCA would like to try a beta-blocker but with her Raynaud's, will use a calcium blocker instead begin diltiazem 120.  This can also help with her blood pressure which is better with her weight loss.

## 2022-02-28 NOTE — Patient Instructions (Signed)
Medication Instructions:  Your physician has recommended you make the following change in your medication:   Begin Diltiazem CD '120mg'$  - 1 tablet by mouth daily.  *If you need a refill on your cardiac medications before your next appointment, please call your pharmacy*   Lab Work: None ordered.  If you have labs (blood work) drawn today and your tests are completely normal, you will receive your results only by: [place] (if you have MyChart) OR A paper copy in the mail If you have any lab test that is abnormal or we need to change your treatment, we will call you to review the results.   Testing/Procedures: None ordered.    Follow-Up: At [facility], you and your health needs are our priority.  As part of our continuing mission to provide you with exceptional heart care, we have created designated Provider Care Teams.  These Care Teams include your primary Cardiologist (physician) and Advanced Practice Providers (APPs -  Physician Assistants and Nurse Practitioners) who all work together to provide you with the care you need, when you need it.  We recommend signing up for the patient portal called "MyChart".  Sign up information is provided on this After Visit Summary.  MyChart is used to connect with patients for Virtual Visits (Telemedicine).  Patients are able to view lab/test results, encounter notes, upcoming appointments, etc.  Non-urgent messages can be sent to your provider as well.   To learn more about what you can do with MyChart, go to [email or web address].    Your next appointment:   12 months with Dr Caryl Comes  Important Information About Sugar

## 2022-03-06 DIAGNOSIS — M4802 Spinal stenosis, cervical region: Secondary | ICD-10-CM | POA: Diagnosis not present

## 2022-04-01 ENCOUNTER — Encounter (INDEPENDENT_AMBULATORY_CARE_PROVIDER_SITE_OTHER): Payer: Self-pay

## 2022-05-03 ENCOUNTER — Ambulatory Visit: Payer: Self-pay | Admitting: *Deleted

## 2022-05-03 ENCOUNTER — Telehealth: Payer: Medicare PPO | Admitting: Emergency Medicine

## 2022-05-03 DIAGNOSIS — U071 COVID-19: Secondary | ICD-10-CM

## 2022-05-03 MED ORDER — NIRMATRELVIR/RITONAVIR (PAXLOVID)TABLET: 3.0000 | ORAL_TABLET | Freq: Two times a day (BID) | ORAL | 0 refills | Status: AC

## 2022-05-03 NOTE — Progress Notes (Signed)
Virtual Visit Consent   Angela [name], you are scheduled for a virtual visit with a [facility] provider today. Just as with appointments in the office, your consent must be obtained to participate. Your consent will be active for this visit and any virtual visit you may have with one of our providers in the next 365 days. If you have a MyChart account, a copy of this consent can be sent to you electronically.  As this is a virtual visit, video technology does not allow for your provider to perform a traditional examination. This may limit your provider's ability to fully assess your condition. If your provider identifies any concerns that need to be evaluated in person or the need to arrange testing (such as labs, EKG, etc.), we will make arrangements to do so. Although advances in technology are sophisticated, we cannot ensure that it will always work on either your end or our end. If the connection with a video visit is poor, the visit may have to be switched to a telephone visit. With either a video or telephone visit, we are not always able to ensure that we have a secure connection.  By engaging in this virtual visit, you consent to the provision of healthcare and authorize for your insurance to be billed (if applicable) for the services provided during this visit. Depending on your insurance coverage, you may receive a charge related to this service.  I need to obtain your verbal consent now. Are you willing to proceed with your visit today? Angela [name] has provided verbal consent on 05/03/2022 for a virtual visit (video or telephone). Carvel Getting, NP  Date: 05/03/2022 1:50 PM  Virtual Visit via Video Note   I, Carvel Getting, connected with  Angela [name]  ([id number], Mar 15, 1971) on 05/03/22 at  1:45 PM EST by a video-enabled telemedicine application and verified that I am speaking with the correct person using two identifiers.  Location: Patient: Virtual Visit Location  Patient: Home Provider: Virtual Visit Location Provider: Home Office   I discussed the limitations of evaluation and management by telemedicine and the availability of in person appointments. The patient expressed understanding and agreed to proceed.    History of Present Illness: Angela [name] is a 50 y.o. who identifies as a female who was assigned female at birth, and is being seen today for COVID.  On 11/28, she woke up not feeling great.  Developed a fever late evening/night 11/29.  Continued with a fever yesterday at 11/30.  She also describes having congestion with yellow drainage, coughing but no shortness of breath or wheezing.  She reports she also feels sweaty when she has a fever and her "skin hurts".  She tested positive for COVID today.  She does have a heart history and she is worried and would like antiviral medication.  She checks her oxygen saturation levels with her watch and they have been normal.    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   Cervical radiculopathy at C6 09/20/2021   Hypertension 03/02/2021   Blue toes 06/27/2020   Cervical spondylosis 05/01/2020   Primary osteoarthritis involving multiple joints 05/01/2020   Rectal polyp    Chronic constipation 10/14/2018   Mucous cyst of digit of left hand 08/13/2018   Low back pain 03/17/2017   OCD (obsessive compulsive disorder) 01/24/2016   Atrial fibrillation ([place]) 07/27/2015  Depression, recurrent ([place]) 11/23/2014   POTS (postural orthostatic tachycardia syndrome) 08/17/2013   Atrial tachycardia/SINUS tachycardia 06/24/2013   Dysautonomia ([place])    Mitral valve disease 12/04/2011   AV BLOCK, COMPLETE 08/25/2008   Pacemaker-Medtronic 08/25/2008    Allergies:  Allergies  Allergen Reactions   Toprol Xl [Metoprolol Succinate] Photosensitivity   Medications:  Current Outpatient Medications:     nirmatrelvir/ritonavir EUA (PAXLOVID) 20 x 150 MG & 10 x '100MG'$  TABS, Take 3 tablets by mouth 2 (two) times daily for 5 days. (Take nirmatrelvir 150 mg two tablets twice daily for 5 days and ritonavir 100 mg one tablet twice daily for 5 days) Patient GFR is >60, Disp: 30 tablet, Rfl: 0   aspirin EC 81 MG tablet, Take 81 mg by mouth daily. Swallow whole., Disp: , Rfl:    celecoxib (CELEBREX) 100 MG capsule, Take 100 mg by mouth 2 (two) times daily., Disp: , Rfl:    cyclobenzaprine (FLEXERIL) 5 MG tablet, Take 5 mg by mouth 3 (three) times daily as needed for muscle spasms., Disp: , Rfl:    diltiazem (CARDIZEM CD) 120 MG 24 hr capsule, Take 1 capsule (120 mg total) by mouth daily., Disp: 90 capsule, Rfl: 3   FIBER ADULT GUMMIES PO, Take 2 each by mouth daily. Vita Fusion Fiberwell, Disp: , Rfl:    hydroxychloroquine (PLAQUENIL) 200 MG tablet, Take 400 mg by mouth daily., Disp: , Rfl:    LORazepam (ATIVAN) 0.5 MG tablet, TAKE 1 TO 2 TABLETS(0.5 TO 1 MG) BY MOUTH DAILY AS NEEDED FOR ANXIETY, Disp: 45 tablet, Rfl: 0   methocarbamol (ROBAXIN) 500 MG tablet, Take 500 mg by mouth 3 (three) times daily as needed., Disp: , Rfl:    Multiple Vitamins-Minerals (MULTIVITAMIN WITH MINERALS) tablet, Take 1 tablet by mouth daily., Disp: , Rfl:    traMADol (ULTRAM) 50 MG tablet, Take 50 mg by mouth every 6 (six) hours as needed., Disp: , Rfl:    venlafaxine XR (EFFEXOR XR) 75 MG 24 hr capsule, Take 1 tab daily with her '150mg'$  for a total of '225mg'$  daily, Disp: 90 capsule, Rfl: 1   venlafaxine XR (EFFEXOR-XR) 150 MG 24 hr capsule, Take 1 capsule (150 mg total) by mouth daily with breakfast., Disp: 90 capsule, Rfl: 1  Observations/Objective: Patient is well-developed, well-nourished in no acute distress.  Resting comfortably  at home.  Head is normocephalic, atraumatic.  No labored breathing.  Speech is clear and coherent with logical content.  Patient is alert and oriented at baseline.  Does sound congested on  video  Assessment and Plan: 1. COVID-19  Rx paxlovid for normal renal function - BMP 01/29/22.   Discussed reasons for seeking f/u care.   Follow Up Instructions: I discussed the assessment and treatment plan with the patient. The patient was provided an opportunity to ask questions and all were answered. The patient agreed with the plan and demonstrated an understanding of the instructions.  A copy of instructions were sent to the patient via MyChart unless otherwise noted below.    The patient was advised to call back or seek an in-person evaluation if the symptoms worsen or if the condition fails to improve as anticipated.  Time:  I spent 15 minutes with the patient via telehealth technology discussing the above problems/concerns.    Carvel Getting, NP

## 2022-05-03 NOTE — Telephone Encounter (Signed)
  Chief Complaint: + COVID Symptoms: skin hurts, fever, body aches, slight cough Frequency: symptoms started Wednesday Pertinent Negatives: Patient denies SOB Disposition: '[]'$ ED /'[]'$ Urgent Care (no appt availability in office) / '[]'$ Appointment(In office/virtual)/ '[x]'$  [place] [facility]/ '[]'$ Home Care/ '[]'$ Refused Recommended Disposition /'[]'$ [place] [facility]/ '[]'$  Follow-up with PCP Additional Notes: Patient advised per COVID protocol- treatment/isolation , no open appointment in office- so patient has scheduled virtual visit with UC provider due to her risk factors

## 2022-05-03 NOTE — Patient Instructions (Signed)
Angela [name], thank you for joining Angela Getting, NP for today's virtual visit.  While this provider is not your primary care provider (PCP), if your PCP is located in our provider database this encounter information will be shared with them immediately following your visit.   [name] account gives you access to today's visit and all your visits, tests, and labs performed at [facility] " click here if you don't have a Angela [name] account or go to [email or web address]  Consent: (Patient) Angela [name] provided verbal consent for this virtual visit at the beginning of the encounter.  Current Medications:  Current Outpatient Medications:    nirmatrelvir/ritonavir EUA (PAXLOVID) 20 x 150 MG & 10 x '100MG'$  TABS, Take 3 tablets by mouth 2 (two) times daily for 5 days. (Take nirmatrelvir 150 mg two tablets twice daily for 5 days and ritonavir 100 mg one tablet twice daily for 5 days) Patient GFR is >60, Disp: 30 tablet, Rfl: 0   aspirin EC 81 MG tablet, Take 81 mg by mouth daily. Swallow whole., Disp: , Rfl:    celecoxib (CELEBREX) 100 MG capsule, Take 100 mg by mouth 2 (two) times daily., Disp: , Rfl:    cyclobenzaprine (FLEXERIL) 5 MG tablet, Take 5 mg by mouth 3 (three) times daily as needed for muscle spasms., Disp: , Rfl:    diltiazem (CARDIZEM CD) 120 MG 24 hr capsule, Take 1 capsule (120 mg total) by mouth daily., Disp: 90 capsule, Rfl: 3   FIBER ADULT GUMMIES PO, Take 2 each by mouth daily. Vita Fusion Fiberwell, Disp: , Rfl:    hydroxychloroquine (PLAQUENIL) 200 MG tablet, Take 400 mg by mouth daily., Disp: , Rfl:    LORazepam (ATIVAN) 0.5 MG tablet, TAKE 1 TO 2 TABLETS(0.5 TO 1 MG) BY MOUTH DAILY AS NEEDED FOR ANXIETY, Disp: 45 tablet, Rfl: 0   methocarbamol (ROBAXIN) 500 MG tablet, Take 500 mg by mouth 3 (three) times daily as needed., Disp: , Rfl:    Multiple Vitamins-Minerals (MULTIVITAMIN WITH MINERALS) tablet, Take 1 tablet by mouth  daily., Disp: , Rfl:    traMADol (ULTRAM) 50 MG tablet, Take 50 mg by mouth every 6 (six) hours as needed., Disp: , Rfl:    venlafaxine XR (EFFEXOR XR) 75 MG 24 hr capsule, Take 1 tab daily with her '150mg'$  for a total of '225mg'$  daily, Disp: 90 capsule, Rfl: 1   venlafaxine XR (EFFEXOR-XR) 150 MG 24 hr capsule, Take 1 capsule (150 mg total) by mouth daily with breakfast., Disp: 90 capsule, Rfl: 1   Medications ordered in this encounter:  Meds ordered this encounter  Medications   nirmatrelvir/ritonavir EUA (PAXLOVID) 20 x 150 MG & 10 x '100MG'$  TABS    Sig: Take 3 tablets by mouth 2 (two) times daily for 5 days. (Take nirmatrelvir 150 mg two tablets twice daily for 5 days and ritonavir 100 mg one tablet twice daily for 5 days) Patient GFR is >60    Dispense:  30 tablet    Refill:  0     *If you need refills on other medications prior to your next appointment, please contact your pharmacy*  Follow-Up: Call back or seek an in-person evaluation if the symptoms worsen or if the condition fails to improve as anticipated.  Angela [name] [phone]  Other Instructions Take paxlovid as prescribed - finish it all, even if you are feeling better. Treat your symptoms as you would if you had a cold  such as by taking mucinex, drinking lots of liquids, and things like saline irrigation or saline spray.   Saline irrigation, such as with a neti pot, several times a day while you are sick can help  treat and prevent sinus infections. Many neti pots come with salt packets premeasured to use to make saline. If you use your own salt, make sure it is kosher salt or sea salt (don't use table salt as it has iodine in it and you don't need that in your nose). Use distilled water to make saline. If you mix your own saline using your own salt, the recipe is 1/4 teaspoon salt in 1 cup warm water.   If you develop shortness of breath or have low oxygen saturation or difficulty breathing, please seek care in  an emergency department   If you have been instructed to have an in-person evaluation today at a local Urgent Care facility, please use the link below. It will take you to a list of all of our available [name], including address, phone number and hours of operation. Please do not delay care.  [name]  If you or a family member do not have a primary care provider, use the link below to schedule a visit and establish care. When you choose a [facility] primary care physician or advanced practice provider, you gain a long-term partner in health. Find a Primary Care Provider  Learn more about [facility]'s in-office and virtual care options: [facility]

## 2022-05-03 NOTE — Telephone Encounter (Signed)
Summary: covid   Pt tested positive for covid and needs to know what she should do / she asked about medication / please advise       Reason for Disposition  [1] HIGH RISK patient (e.g., weak immune system, age > 68 years, obesity with BMI 30 or higher, pregnant, chronic lung disease or other chronic medical condition) AND [2] COVID symptoms (e.g., cough, fever)  (Exceptions: Already seen by PCP and no new or worsening symptoms.)  Answer Assessment - Initial Assessment Questions 1. COVID-19 DIAGNOSIS: "How do you know that you have COVID?" (e.g., positive lab test or self-test, diagnosed by doctor or NP/PA, symptoms after exposure).     + home test today 2. COVID-19 EXPOSURE: "Was there any known exposure to COVID before the symptoms began?" CDC Definition of close contact: within 6 feet (2 meters) for a total of 15 minutes or more over a 24-hour period.      Unsure- wedding 3. ONSET: "When did the COVID-19 symptoms start?"      Symptoms started Wednesday 4. WORST SYMPTOM: "What is your worst symptom?" (e.g., cough, fever, shortness of breath, muscle aches)     Skin hurts 5. COUGH: "Do you have a cough?" If Yes, ask: "How bad is the cough?"       Little bit 6. FEVER: "Do you have a fever?" If Yes, ask: "What is your temperature, how was it measured, and when did it start?"     Yes- 102, oral 7. RESPIRATORY STATUS: "Describe your breathing?" (e.g., normal; shortness of breath, wheezing, unable to speak)      normal 8. BETTER-SAME-WORSE: "Are you getting better, staying the same or getting worse compared to yesterday?"  If getting worse, ask, "In what way?"     worse 9. OTHER SYMPTOMS: "Do you have any other symptoms?"  (e.g., chills, fatigue, headache, loss of smell or taste, muscle pain, sore throat)     Sore throat, fatigue 10. HIGH RISK DISEASE: "Do you have any chronic medical problems?" (e.g., asthma, heart or lung disease, weak immune system, obesity, etc.)       Heart disease 11.  VACCINE: "Have you had the COVID-19 vaccine?" If Yes, ask: "Which one, how many shots, when did you get it?"       Yes- not current booster  Protocols used: Coronavirus (COVID-19) Diagnosed or Suspected-A-AH

## 2022-05-08 ENCOUNTER — Ambulatory Visit: Payer: Medicare PPO

## 2022-05-09 DIAGNOSIS — M79643 Pain in unspecified hand: Secondary | ICD-10-CM | POA: Diagnosis not present

## 2022-05-09 DIAGNOSIS — M25551 Pain in right hip: Secondary | ICD-10-CM | POA: Diagnosis not present

## 2022-05-09 DIAGNOSIS — M1990 Unspecified osteoarthritis, unspecified site: Secondary | ICD-10-CM | POA: Diagnosis not present

## 2022-05-09 DIAGNOSIS — Z79899 Other long term (current) drug therapy: Secondary | ICD-10-CM | POA: Diagnosis not present

## 2022-05-09 DIAGNOSIS — M7060 Trochanteric bursitis, unspecified hip: Secondary | ICD-10-CM | POA: Diagnosis not present

## 2022-05-09 DIAGNOSIS — M2550 Pain in unspecified joint: Secondary | ICD-10-CM | POA: Diagnosis not present

## 2022-05-10 ENCOUNTER — Ambulatory Visit (INDEPENDENT_AMBULATORY_CARE_PROVIDER_SITE_OTHER): Payer: Medicare PPO

## 2022-05-10 DIAGNOSIS — I442 Atrioventricular block, complete: Secondary | ICD-10-CM | POA: Diagnosis not present

## 2022-05-10 LAB — CUP PACEART REMOTE DEVICE CHECK
Battery Remaining Longevity: 16 mo
Battery Voltage: 2.91 V
Brady Statistic AP VP Percent: 1.11 %
Brady Statistic AP VS Percent: 0 %
Brady Statistic AS VP Percent: 98.87 %
Brady Statistic AS VS Percent: 0.01 %
Brady Statistic RA Percent Paced: 1.12 %
Brady Statistic RV Percent Paced: 99.98 %
Date Time Interrogation Session: 20231208114813
Implantable Lead Connection Status: 753985
Implantable Lead Connection Status: 753985
Implantable Lead Implant Date: 20070319
Implantable Lead Implant Date: 20070319
Implantable Lead Location: 753859
Implantable Lead Location: 753860
Implantable Lead Model: 5076
Implantable Lead Model: 5076
Implantable Pulse Generator Implant Date: 20160210
Lead Channel Impedance Value: 285 Ohm
Lead Channel Impedance Value: 380 Ohm
Lead Channel Impedance Value: 437 Ohm
Lead Channel Impedance Value: 456 Ohm
Lead Channel Pacing Threshold Amplitude: 0.75 V
Lead Channel Pacing Threshold Amplitude: 1 V
Lead Channel Pacing Threshold Pulse Width: 0.4 ms
Lead Channel Pacing Threshold Pulse Width: 0.4 ms
Lead Channel Sensing Intrinsic Amplitude: 2.375 mV
Lead Channel Sensing Intrinsic Amplitude: 2.375 mV
Lead Channel Sensing Intrinsic Amplitude: 3.375 mV
Lead Channel Sensing Intrinsic Amplitude: 3.375 mV
Lead Channel Setting Pacing Amplitude: 2 V
Lead Channel Setting Pacing Amplitude: 2.5 V
Lead Channel Setting Pacing Pulse Width: 0.4 ms
Lead Channel Setting Sensing Sensitivity: 2 mV
Zone Setting Status: 755011
Zone Setting Status: 755011

## 2022-05-31 NOTE — Progress Notes (Signed)
Remote pacemaker transmission.   

## 2022-06-25 DIAGNOSIS — M2550 Pain in unspecified joint: Secondary | ICD-10-CM | POA: Diagnosis not present

## 2022-06-25 DIAGNOSIS — Z79899 Other long term (current) drug therapy: Secondary | ICD-10-CM | POA: Diagnosis not present

## 2022-06-25 DIAGNOSIS — M25551 Pain in right hip: Secondary | ICD-10-CM | POA: Diagnosis not present

## 2022-06-25 DIAGNOSIS — M1990 Unspecified osteoarthritis, unspecified site: Secondary | ICD-10-CM | POA: Diagnosis not present

## 2022-06-25 DIAGNOSIS — M79643 Pain in unspecified hand: Secondary | ICD-10-CM | POA: Diagnosis not present

## 2022-06-25 DIAGNOSIS — M7060 Trochanteric bursitis, unspecified hip: Secondary | ICD-10-CM | POA: Diagnosis not present

## 2022-07-12 ENCOUNTER — Encounter: Payer: Medicare PPO | Admitting: Family Medicine

## 2022-07-31 ENCOUNTER — Other Ambulatory Visit: Payer: Self-pay | Admitting: Family Medicine

## 2022-07-31 DIAGNOSIS — F3289 Other specified depressive episodes: Secondary | ICD-10-CM

## 2022-08-01 NOTE — Telephone Encounter (Signed)
Requested medication (s) are due for refill today yes  Requested medication (s) are on the active medication list -yes  Future visit scheduled -yes  Last refill: 02/06/22 #45  Notes to clinic: non delegated Rx  Requested Prescriptions  Pending Prescriptions Disp Refills   LORazepam (ATIVAN) 0.5 MG tablet [Pharmacy Med Name: LORAZEPAM 0.'5MG'$  TABLETS] 45 tablet     Sig: TAKE 1 TO 2 TABLETS(0.5 TO 1 MG) BY MOUTH DAILY AS NEEDED FOR ANXIETY     Not Delegated - Psychiatry: Anxiolytics/Hypnotics 2 Failed - 07/31/2022  3:00 PM      Failed - This refill cannot be delegated      Failed - Urine Drug Screen completed in last 360 days      Failed - Valid encounter within last 6 months    Recent Outpatient Visits           6 months ago Primary hypertension   [place], Barb Merino, DO   1 year ago Routine general medical examination at a health care facility   [place], [place] P, DO   1 year ago Breast lump on left side at 2 o'clock position   [place], Megan P, DO   1 year ago Needs flu shot   [place], [place], DO   1 year ago Depression, recurrent [facility])   [place], Barb Merino, DO       Future Appointments             In 1 week Wynetta Emery, Barb Merino, DO [place], [place] - Patient is not pregnant         Requested Prescriptions  Pending Prescriptions Disp Refills   LORazepam (ATIVAN) 0.5 MG tablet [Pharmacy Med Name: LORAZEPAM 0.'5MG'$  TABLETS] 45 tablet     Sig: TAKE 1 TO 2 TABLETS(0.5 TO 1 MG) BY MOUTH DAILY AS NEEDED FOR ANXIETY     Not Delegated - Psychiatry: Anxiolytics/Hypnotics 2 Failed - 07/31/2022  3:00 PM      Failed - This refill cannot be delegated      Failed - Urine Drug Screen completed in last 360 days      Failed - Valid encounter within last 6 months     Recent Outpatient Visits           6 months ago Primary hypertension   [place], [place], DO   1 year ago Routine general medical examination at a health care facility   [place], [place], DO   1 year ago Breast lump on left side at 2 o'clock position   [place], [place] P, DO   1 year ago Needs flu shot   [place], [place], DO   1 year ago Depression, recurrent [facility])   [place], Barb Merino, DO       Future Appointments             In 1 week Valerie Roys, DO [place], [place] - Patient is not pregnant

## 2022-08-02 ENCOUNTER — Telehealth: Payer: Self-pay | Admitting: Family Medicine

## 2022-08-02 NOTE — Telephone Encounter (Signed)
Copied from [place] [id number]. Topic: Medicare AWV >> Aug 02, 2022 11:10 AM Devoria Glassing wrote: Reason for CRM: Called patient to schedule Medicare Annual Wellness Visit (AWV). Left message for patient to call back and schedule Medicare Annual Wellness Visit (AWV).  Last date of AWV: 07/31/21  Please schedule an appointment at any time with Kirke Shaggy, NHA  .  If any questions, please contact me.  Thank you ,  Sherol Dade; [place] Direct Dial: [phone]

## 2022-08-06 ENCOUNTER — Other Ambulatory Visit: Payer: Self-pay | Admitting: Family Medicine

## 2022-08-06 DIAGNOSIS — F3342 Major depressive disorder, recurrent, in full remission: Secondary | ICD-10-CM

## 2022-08-08 ENCOUNTER — Ambulatory Visit (INDEPENDENT_AMBULATORY_CARE_PROVIDER_SITE_OTHER): Payer: Medicare PPO | Admitting: Family Medicine

## 2022-08-08 ENCOUNTER — Encounter: Payer: Self-pay | Admitting: Family Medicine

## 2022-08-08 VITALS — BP 123/85 | HR 82 | Temp 98.0°F | Ht 64.25 in | Wt 166.3 lb

## 2022-08-08 DIAGNOSIS — Z0000 Encounter for general adult medical examination without abnormal findings: Secondary | ICD-10-CM | POA: Diagnosis not present

## 2022-08-08 DIAGNOSIS — G901 Familial dysautonomia [Riley-Day]: Secondary | ICD-10-CM

## 2022-08-08 DIAGNOSIS — F3342 Major depressive disorder, recurrent, in full remission: Secondary | ICD-10-CM | POA: Diagnosis not present

## 2022-08-08 DIAGNOSIS — M7061 Trochanteric bursitis, right hip: Secondary | ICD-10-CM | POA: Diagnosis not present

## 2022-08-08 DIAGNOSIS — Z23 Encounter for immunization: Secondary | ICD-10-CM

## 2022-08-08 DIAGNOSIS — Z136 Encounter for screening for cardiovascular disorders: Secondary | ICD-10-CM

## 2022-08-08 DIAGNOSIS — Z1231 Encounter for screening mammogram for malignant neoplasm of breast: Secondary | ICD-10-CM

## 2022-08-08 DIAGNOSIS — I10 Essential (primary) hypertension: Secondary | ICD-10-CM | POA: Diagnosis not present

## 2022-08-08 DIAGNOSIS — G90A Postural orthostatic tachycardia syndrome (POTS): Secondary | ICD-10-CM | POA: Diagnosis not present

## 2022-08-08 DIAGNOSIS — F339 Major depressive disorder, recurrent, unspecified: Secondary | ICD-10-CM

## 2022-08-08 DIAGNOSIS — I4891 Unspecified atrial fibrillation: Secondary | ICD-10-CM | POA: Diagnosis not present

## 2022-08-08 MED ORDER — LORAZEPAM 0.5 MG PO TABS: ORAL_TABLET | ORAL | 1 refills | Status: DC

## 2022-08-08 MED ORDER — VENLAFAXINE HCL ER 150 MG PO CP24: ORAL_CAPSULE | ORAL | 1 refills | Status: DC

## 2022-08-08 MED ORDER — VENLAFAXINE HCL ER 75 MG PO CP24
ORAL_CAPSULE | ORAL | 1 refills | Status: DC
Start: 2022-08-08 — End: 2022-11-06

## 2022-08-08 NOTE — Assessment & Plan Note (Signed)
Under stable control on current regimen- will consider cutting down on her medicine in about 3-4 months. Continue current regimen. Continue to monitor. Call with any concerns. Refills given.

## 2022-08-08 NOTE — Progress Notes (Signed)
BP 123/85   Pulse 82   Temp 98 F (36.7 C) (Oral)   Ht 5' 4.25" (1.632 m)   Wt 166 lb 4.8 oz (75.4 kg)   SpO2 99%   BMI 28.32 kg/m    Subjective:    Patient ID: Angela [name], female    DOB: January 15, 1971, 52 y.o.   MRN: [id number]  HPI: Angela [name] is a 52 y.o. female presenting on 08/08/2022 for comprehensive medical examination. Current medical complaints include:  HYPERTENSION- Dysautonomia is doing well. Has been up and down. Doing OK Hypertension status: controlled  Satisfied with current treatment? yes Duration of hypertension: chronic BP monitoring frequency:  not checking BP range:  BP medication side effects:  no Medication compliance: stable Previous BP meds:  Aspirin: yes Recurrent headaches: no Visual changes: no Palpitations: yes Dyspnea: no Chest pain: no Lower extremity edema: no Dizzy/lightheaded: yes  ANXIETY/DEPRESSION- has a lot going on right now. Has been feeling a little blunted. Duration: chronic Status:exacerbated Anxious mood: yes  Excessive worrying: yes Irritability: no  Sweating: no Nausea: no Palpitations:no Hyperventilation: no Panic attacks: no Agoraphobia: no  Obscessions/compulsions: no Depressed mood: yes    08/08/2022    9:31 AM 01/08/2022    9:04 AM 07/31/2021    9:27 AM 07/11/2021    9:34 AM 04/17/2021    3:22 PM  Depression screen PHQ 2/9  Decreased Interest '1 1 1 1 '$ 0  Down, Depressed, Hopeless '1 1 1 1 '$ 0  PHQ - 2 Score '2 2 2 2 '$ 0  Altered sleeping '3 2 1 3 2  '$ Tired, decreased energy '2 1 2 2 1  '$ Change in appetite 2 0 0 0 0  Feeling bad or failure about yourself  0 0 0 0 0  Trouble concentrating 2 1 0 2 2  Moving slowly or fidgety/restless 2 0 0 0 0  Suicidal thoughts 0 0 0 0 0  PHQ-9 Score '13 6 5 9 5  '$ Difficult doing work/chores Very difficult Somewhat difficult Not difficult at all Not difficult at all    Anhedonia: no Weight changes: no Insomnia: yes   Hypersomnia: no Fatigue/loss of energy: yes Feelings  of worthlessness: no Feelings of guilt: no Impaired concentration/indecisiveness: yes Suicidal ideations: no  Crying spells: no Recent Stressors/Life Changes: yes   Relationship problems: no   Family stress: yes     Financial stress: no    Job stress: no    Recent death/loss: no  She currently lives with: husband Menopausal Symptoms: no  Functional Status Survey: Is the patient deaf or have difficulty hearing?: No Does the patient have difficulty seeing, even when wearing glasses/contacts?: No Does the patient have difficulty concentrating, remembering, or making decisions?: No Does the patient have difficulty walking or climbing stairs?: No Does the patient have difficulty dressing or bathing?: No Does the patient have difficulty doing errands alone such as visiting a doctor's office or shopping?: No     08/08/2022    9:31 AM 07/31/2021    9:20 AM 07/11/2021    9:34 AM 07/21/2020    2:43 PM 03/24/2020    9:40 AM  Fall Risk   Falls in the past year? 1 0 0 1 0  Comment    got dizzy, not sure what happened   Number falls in past yr: 1 0 0 1 0  Injury with Fall? 1 0 0 0 0  Risk for fall due to : History of fall(s)  No Fall Risks Medication  side effect;Other (Comment) No Fall Risks  Risk for fall due to: Comment    gets dizzy regularly   Follow up Falls evaluation completed Falls evaluation completed;Falls prevention discussed Falls evaluation completed Falls evaluation completed;Education provided;Falls prevention discussed Falls evaluation completed    Depression Screen    08/08/2022    9:31 AM 01/08/2022    9:04 AM 07/31/2021    9:27 AM 07/11/2021    9:34 AM 04/17/2021    3:22 PM  Depression screen PHQ 2/9  Decreased Interest '1 1 1 1 '$ 0  Down, Depressed, Hopeless '1 1 1 1 '$ 0  PHQ - 2 Score '2 2 2 2 '$ 0  Altered sleeping '3 2 1 3 2  '$ Tired, decreased energy '2 1 2 2 1  '$ Change in appetite 2 0 0 0 0  Feeling bad or failure about yourself  0 0 0 0 0  Trouble concentrating 2 1 0 2 2   Moving slowly or fidgety/restless 2 0 0 0 0  Suicidal thoughts 0 0 0 0 0  PHQ-9 Score '13 6 5 9 5  '$ Difficult doing work/chores Very difficult Somewhat difficult Not difficult at all Not difficult at all    Advanced Directives Does patient have a HCPOA?    yes If yes, name and contact information:  Does patient have a living will or MOST form?  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 EF 50-55% normal coronaries   Complete heart block (HCC)    Depression    Dysautonomia (HCC)    Dyspnea    CT negative for PE, 2007   Hypertension    Insomnia    OCD (obsessive compulsive disorder)    SVT (supraventricular tachycardia)    s/p ablation a. c/b AV nod ablation requiring pacemaker    Surgical History:  Past Surgical History:  Procedure Laterality Date   ANTERIOR CERVICAL DECOMP/DISCECTOMY FUSION N/A 09/20/2021   Procedure: Anterior Cervical Discectomy Fusion  Cervical Four-Five, Cervical Five-Six, Cervical Six-Seven;  Surgeon: Dawley, Theodoro Doing, DO;  Location: [facility];  Service: Neurosurgery;  Laterality: N/A;  3C   BREAST BIOPSY Right 1998   BREAST BIOPSY Right 2004   BREAST LUMPECTOMY     RIGHT -BREAST    COLONOSCOPY WITH PROPOFOL N/A 09/07/2019   Procedure: COLONOSCOPY WITH PROPOFOL;  Surgeon: Lucilla Lame, MD;  Location: [facility];  Service: Endoscopy;  Laterality: N/A;   ENDOMETRIAL ABLATION  2005   INSERT / REPLACE / REMOVE PACEMAKER     PELVIC LAPAROSCOPY  1990   PERMANENT PACEMAKER GENERATOR CHANGE N/A 07/13/2014   MDT MRI compatible dual chamber pacemaker implanted by Dr Jeralene Peters  2005   TUBAL LIGATION  2003    Medications:  Current Outpatient Medications on File Prior to Visit  Medication Sig   aspirin EC 81 MG tablet Take 81 mg by mouth daily. Swallow whole.   celecoxib (CELEBREX) 100 MG capsule Take 100 mg by  mouth 2 (two) times daily.   cyclobenzaprine (FLEXERIL) 5 MG tablet Take 5 mg by mouth 3 (three) times daily as needed for muscle spasms.   diltiazem (CARDIZEM CD) 120 MG 24 hr capsule Take 1 capsule (120 mg total) by mouth daily.   FIBER ADULT GUMMIES PO Take 2 each by mouth daily. Vita Fusion Fiberwell  hydroxychloroquine (PLAQUENIL) 200 MG tablet Take 400 mg by mouth daily.   methocarbamol (ROBAXIN) 500 MG tablet Take 500 mg by mouth 3 (three) times daily as needed.   Multiple Vitamins-Minerals (MULTIVITAMIN WITH MINERALS) tablet Take 1 tablet by mouth daily.   traMADol (ULTRAM) 50 MG tablet Take 50 mg by mouth every 6 (six) hours as needed.   No current facility-administered medications on file prior to visit.    Allergies:  Allergies  Allergen Reactions   Toprol Xl [Metoprolol Succinate] Photosensitivity    Social History:  Social History   Socioeconomic History   Marital status: Married    Spouse name: Not on file   Number of children: Not on file   Years of education: Not on file   Highest education level: Bachelor's degree (e.g., BA, AB, BS)  Occupational History   Occupation: disability   Tobacco Use   Smoking status: Never   Smokeless tobacco: Never  Vaping Use   Vaping Use: Never used  Substance and Sexual Activity   Alcohol use: No   Drug use: No   Sexual activity: Yes    Birth control/protection: Surgical    Comment: Uterine ablarion 2002  Other Topics Concern   Not on file  Social History Narrative   Work with daughters school    Social Determinants of Health   Financial Resource Strain: Low Risk  (07/31/2021)   Overall Financial Resource Strain (CARDIA)    Difficulty of Paying Living Expenses: Not hard at all  Food Insecurity: No Food Insecurity (07/31/2021)   Hunger Vital Sign    Worried About Running Out of Food in the Last Year: Never true    [place] in the Last Year: Never true  Transportation Needs: No Transportation Needs (07/31/2021)    PRAPARE - Hydrologist (Medical): No    Lack of Transportation (Non-Medical): No  Physical Activity: Insufficiently Active (07/31/2021)   Exercise Vital Sign    Days of Exercise per Week: 2 days    Minutes of Exercise per Session: 20 min  Stress: No Stress Concern Present (07/31/2021)   [place]    Feeling of Stress : Only a little  Social Connections: Socially Integrated (07/31/2021)   Social Connection and Isolation Panel [NHANES]    Frequency of Communication with Friends and Family: Three times a week    Frequency of Social Gatherings with Friends and Family: Three times a week    Attends Religious Services: More than 4 times per year    Active Member of Clubs or Organizations: Yes    Attends Archivist Meetings: More than 4 times per year    Marital Status: Married  Human resources officer Violence: Not At Risk (07/31/2021)   Humiliation, Afraid, Rape, and Kick questionnaire    Fear of Current or Ex-Partner: No    Emotionally Abused: No    Physically Abused: No    Sexually Abused: No   Social History   Tobacco Use  Smoking Status Never  Smokeless Tobacco Never   Social History   Substance and Sexual Activity  Alcohol Use No    Family History:  Family History  Problem Relation Age of Onset   Anxiety disorder Mother    Arthritis Mother    Obesity Mother    Cervical cancer Mother    Hypertension Father    Cancer Father 64       COLON   Diabetes Father  Anxiety disorder Sister    Breast cancer Maternal Aunt        mastectomy   Cancer Maternal Aunt    Breast cancer Paternal Aunt    Cancer Paternal Aunt    Breast cancer Paternal Aunt    Dementia Maternal Grandmother    Hypertension Maternal Grandfather    Diabetes Paternal Grandmother    Breast cancer Paternal Grandmother    Arthritis Paternal Grandmother    Cancer Paternal Grandmother    Diabetes Paternal  Grandfather    Cancer Paternal Grandfather     Past medical history, surgical history, medications, allergies, family history and social history reviewed with patient today and changes made to appropriate areas of the chart.   Review of Systems  Constitutional: Negative.   HENT:  Positive for ear pain (R ear 75% of the time, L ear occasionally). Negative for congestion, ear discharge, hearing loss, nosebleeds, sinus pain, sore throat and tinnitus.   Eyes: Negative.   Respiratory:  Positive for shortness of breath (with anxiety). Negative for cough, hemoptysis, sputum production, wheezing and stridor.   Cardiovascular:  Positive for palpitations. Negative for chest pain, orthopnea, claudication, leg swelling and PND.  Gastrointestinal: Negative.   Genitourinary: Negative.   Musculoskeletal: Negative.   Skin: Negative.   Neurological:  Positive for dizziness. Negative for tingling, tremors, sensory change, speech change, focal weakness, seizures, loss of consciousness, weakness and headaches.  Endo/Heme/Allergies:  Negative for environmental allergies and polydipsia. Bruises/bleeds easily.  Psychiatric/Behavioral:  Positive for depression. Negative for hallucinations, memory loss, substance abuse and suicidal ideas. The patient is nervous/anxious. The patient does not have insomnia.     All other ROS negative except what is listed above and in the HPI.      Objective:    BP 123/85   Pulse 82   Temp 98 F (36.7 C) (Oral)   Ht 5' 4.25" (1.632 m)   Wt 166 lb 4.8 oz (75.4 kg)   SpO2 99%   BMI 28.32 kg/m   Wt Readings from Last 3 Encounters:  08/08/22 166 lb 4.8 oz (75.4 kg)  02/28/22 159 lb (72.1 kg)  01/29/22 154 lb (69.9 kg)    Physical Exam Vitals and nursing note reviewed.  Constitutional:      General: She is not in acute distress.    Appearance: Normal appearance. She is not ill-appearing, toxic-appearing or diaphoretic.  HENT:     Head: Normocephalic and atraumatic.      Right Ear: Tympanic membrane, ear canal and external ear normal. There is no impacted cerumen.     Left Ear: Tympanic membrane, ear canal and external ear normal. There is no impacted cerumen.     Nose: Nose normal. No congestion or rhinorrhea.     Mouth/Throat:     Mouth: Mucous membranes are moist.     Pharynx: Oropharynx is clear. No oropharyngeal exudate or posterior oropharyngeal erythema.  Eyes:     General: No scleral icterus.       Right eye: No discharge.        Left eye: No discharge.     Extraocular Movements: Extraocular movements intact.     Conjunctiva/sclera: Conjunctivae normal.     Pupils: Pupils are equal, round, and reactive to light.  Neck:     Vascular: No carotid bruit.  Cardiovascular:     Rate and Rhythm: Normal rate and regular rhythm.     Pulses: Normal pulses.     Heart sounds: No murmur heard.  No friction rub. No gallop.  Pulmonary:     Effort: Pulmonary effort is normal. No respiratory distress.     Breath sounds: Normal breath sounds. No stridor. No wheezing, rhonchi or rales.  Chest:     Chest wall: No tenderness.  Abdominal:     General: Abdomen is flat. Bowel sounds are normal. There is no distension.     Palpations: Abdomen is soft. There is no mass.     Tenderness: There is no abdominal tenderness. There is no right CVA tenderness, left CVA tenderness, guarding or rebound.     Hernia: No hernia is present.  Genitourinary:    Comments: Breast and pelvic exams deferred with shared decision making Musculoskeletal:        General: No swelling, tenderness, deformity or signs of injury.     Cervical back: Normal range of motion and neck supple. No rigidity. No muscular tenderness.     Right lower leg: No edema.     Left lower leg: No edema.  Lymphadenopathy:     Cervical: No cervical adenopathy.  Skin:    General: Skin is warm and dry.     Capillary Refill: Capillary refill takes less than 2 seconds.     Coloration: Skin is not jaundiced or  pale.     Findings: No bruising, erythema, lesion or rash.  Neurological:     General: No focal deficit present.     Mental Status: She is alert and oriented to person, place, and time. Mental status is at baseline.     Cranial Nerves: No cranial nerve deficit.     Sensory: No sensory deficit.     Motor: No weakness.     Coordination: Coordination normal.     Gait: Gait normal.     Deep Tendon Reflexes: Reflexes normal.  Psychiatric:        Mood and Affect: Mood normal.        Behavior: Behavior normal.        Thought Content: Thought content normal.        Judgment: Judgment normal.        08/08/2022    9:56 AM 07/21/2020    2:51 PM 07/09/2019    8:44 AM 01/23/2018   10:49 AM 01/17/2017    9:55 AM  6CIT Screen  What Year? 0 points 0 points 0 points 0 points 0 points  What month? 0 points 0 points 0 points 0 points 0 points  What time? 0 points 0 points 0 points 0 points 0 points  Count back from 20 0 points 0 points 0 points 0 points 0 points  Months in reverse 0 points 0 points 0 points 0 points 0 points  Repeat phrase 0 points 4 points 2 points 0 points 2 points  Total Score 0 points 4 points 2 points 0 points 2 points    Results for orders placed or performed in visit on 05/08/22  CUP PACEART REMOTE DEVICE CHECK  Result Value Ref Range   Date Time Interrogation Session [id number]    Pulse Generator Manufacturer MERM    Pulse Gen Model [id number] Advisa DR MRI    Pulse Gen Serial Number [id number]    Clinic Name [facility] Pulse Generator Type Implantable Pulse Generator    Implantable Pulse Generator Implant Date [id number]    Implantable Lead Manufacturer [facility]    Implantable Lead Model 5076 CapSureFix Novus    Implantable Lead Serial Number [id number]    Implantable  Lead Implant Date IL:[id number]    Implantable Lead Location Detail 1 APPENDAGE    Implantable Lead Location [id number]    Implantable Lead Connection Status [id number]    Implantable Lead  Manufacturer MERM    Implantable Lead Model 5076 CapSureFix Novus    Implantable Lead Serial Number [id number]    Implantable Lead Implant Date IL:[id number]    Implantable Lead Location Detail 1 APEX    Implantable Lead Location [id number]    Implantable Lead Connection Status [phone]    Lead Channel Setting Sensing Sensitivity 2 mV   Lead Channel Setting Pacing Amplitude 2 V   Lead Channel Setting Pacing Pulse Width 0.4 ms   Lead Channel Setting Pacing Amplitude 2.5 V   Zone Setting Status [id number]    Zone Setting Status [phone]    Lead Channel Impedance Value 456 ohm   Lead Channel Impedance Value 285 ohm   Lead Channel Sensing Intrinsic Amplitude 2.375 mV   Lead Channel Sensing Intrinsic Amplitude 2.375 mV   Lead Channel Pacing Threshold Amplitude 1 V   Lead Channel Pacing Threshold Pulse Width 0.4 ms   Lead Channel Impedance Value 437 ohm   Lead Channel Impedance Value 380 ohm   Lead Channel Sensing Intrinsic Amplitude 3.375 mV   Lead Channel Sensing Intrinsic Amplitude 3.375 mV   Lead Channel Pacing Threshold Amplitude 0.75 V   Lead Channel Pacing Threshold Pulse Width 0.4 ms   Battery Status OK    Battery Remaining Longevity 16 mo   Battery Voltage 2.91 V   Brady Statistic RA Percent Paced 1.12 %   Brady Statistic RV Percent Paced 99.98 %   Brady Statistic AP VP Percent 1.11 %   Brady Statistic AS VP Percent 98.87 %   Brady Statistic AP VS Percent 0 %   Brady Statistic AS VS Percent 0.01 %      Assessment & Plan:   Problem List Items Addressed This Visit       Cardiovascular and Mediastinum   POTS (postural orthostatic tachycardia syndrome)    Stable. Continue to follow with cardiology. Call with any concerns. Continue to monitor.       Atrial fibrillation (HCC)    Stable. Continue to follow with cardiology. Call with any concerns. Continue to monitor.       Hypertension    Stable. Continue to follow with cardiology. Call with any concerns. Continue to monitor.        Relevant Orders   CBC with Differential/Platelet   Comprehensive metabolic panel   Urinalysis, Routine w reflex microscopic   TSH   Microalbumin, Urine Waived     Nervous and Auditory   Dysautonomia (HCC)    Stable. Continue to monitor. Call with any concerns.       Relevant Orders   CBC with Differential/Platelet   Comprehensive metabolic panel     Other   Depression, recurrent ([place])    Under stable control on current regimen- will consider cutting down on her medicine in about 3-4 months. Continue current regimen. Continue to monitor. Call with any concerns. Refills given.        Relevant Medications   LORazepam (ATIVAN) 0.5 MG tablet   venlafaxine XR (EFFEXOR XR) 75 MG 24 hr capsule   venlafaxine XR (EFFEXOR-XR) 150 MG 24 hr capsule   Other Relevant Orders   CBC with Differential/Platelet   Comprehensive metabolic panel   Other Visit Diagnoses     Encounter for Medicare annual wellness exam    -  Primary   Preventative care discussed today as below.   Routine general medical examination at a health care facility       Vaccines up to date/declined. Screening labs checked today. Pap, mammo and colonoscopy up to date. Continue diet and exercise. Call with any concerns.   Greater trochanteric bursitis of right hip       Referral to ortho made today.   Relevant Orders   Ambulatory referral to Orthopedic Surgery   CBC with Differential/Platelet   Comprehensive metabolic panel   Needs flu shot       Flu shot given today.   Relevant Orders   Flu Vaccine QUAD 6+ mos PF IM (Fluarix Quad PF) (Completed)   Encounter for screening mammogram for malignant neoplasm of breast       Mammogram ordered today.   Relevant Orders   MM 3D SCREENING MAMMOGRAM BILATERAL BREAST   Screening for cardiovascular condition       Lipids drawn today. Await results. Treat as needed.   Relevant Orders   Lipid Panel w/o Chol/HDL Ratio        Preventative Services:  Health Risk Assessment  and Personalized Prevention Plan: Done today Bone Mass Measurements: N/A Breast Cancer Screening: due in April CVD Screening: Done today Cervical Cancer Screening: up to date Colon Cancer Screening: up to date Depression Screening: Done today Diabetes Screening: N/A Glaucoma Screening: up to date Hepatitis B vaccine: N/A Hepatitis C screening: up to date HIV Screening: up to date Flu Vaccine: done today Lung cancer Screening: N/A Obesity Screening: Done today Pneumonia Vaccines (2): N/A STI Screening: N/A  Follow up plan: Return in about 4 months (around 12/08/2022).   LABORATORY TESTING:  - Pap smear: up to date  IMMUNIZATIONS:   - Tdap: Tetanus vaccination status reviewed: last tetanus booster within 10 years. - Influenza: Administered today - Pneumovax: Not applicable - Prevnar: Not applicable - Zostavax vaccine: Given elsewhere  SCREENING: -Mammogram: Up to date  - Colonoscopy: Up to date   PATIENT COUNSELING:   Advised to take 1 mg of folate supplement per day if capable of pregnancy.   Sexuality: Discussed sexually transmitted diseases, partner selection, use of condoms, avoidance of unintended pregnancy  and contraceptive alternatives.   Advised to avoid cigarette smoking.  I discussed with the patient that most people either abstain from alcohol or drink within safe limits (<=14/week and <=4 drinks/occasion for males, <=7/weeks and <= 3 drinks/occasion for females) and that the risk for alcohol disorders and other health effects rises proportionally with the number of drinks per week and how often a drinker exceeds daily limits.  Discussed cessation/primary prevention of drug use and availability of treatment for abuse.   Diet: Encouraged to adjust caloric intake to maintain  or achieve ideal body weight, to reduce intake of dietary saturated fat and total fat, to limit sodium intake by avoiding high sodium foods and not adding table salt, and to maintain adequate  dietary potassium and calcium preferably from fresh fruits, vegetables, and low-fat dairy products.    stressed the importance of regular exercise  Injury prevention: Discussed safety belts, safety helmets, smoke detector, smoking near bedding or upholstery.   Dental health: Discussed importance of regular tooth brushing, flossing, and dental visits.    NEXT PREVENTATIVE PHYSICAL DUE IN 1 YEAR. Return in about 4 months (around 12/08/2022).

## 2022-08-08 NOTE — Assessment & Plan Note (Signed)
Stable. Continue to follow with cardiology. Call with any concerns. Continue to monitor.

## 2022-08-08 NOTE — Assessment & Plan Note (Signed)
Stable. Continue to monitor. Call with any concerns.  

## 2022-08-08 NOTE — Patient Instructions (Addendum)
Preventative Services:  Health Risk Assessment and Personalized Prevention Plan: Done today Bone Mass Measurements: N/A Breast Cancer Screening: due in April CVD Screening: Done today Cervical Cancer Screening: up to date Colon Cancer Screening: up to date Depression Screening: Done today Diabetes Screening: N/A Glaucoma Screening: up to date Hepatitis B vaccine: N/A Hepatitis C screening: up to date HIV Screening: up to date Flu Vaccine: done today Lung cancer Screening: N/A Obesity Screening: Done today Pneumonia Vaccines (2): N/A STI Screening: N/A  Please call to schedule your mammogram and/or bone density: [facility] [place]: [address], [place], [place] [address] Phone: [phone]  [facility] [facility] [address] [place],  [place]  [address] Phone: [phone]

## 2022-08-08 NOTE — Progress Notes (Deleted)
BP 123/85   Pulse 82   Temp 98 F (36.7 C) (Oral)   Ht 5' 4.25" (1.632 m)   Wt 166 lb 4.8 oz (75.4 kg)   SpO2 99%   BMI 28.32 kg/m    Subjective:    Patient ID: Angela [name], female    DOB: 05/24/1971, 51 y.o.   MRN: [id number]  HPI: Angela [name] is a 53 y.o. female presenting on 08/08/2022 for comprehensive medical examination. Current medical complaints include:  HYPERTENSION- Dysautonomia is doing well. Has been up and down. Doing OK Hypertension status: controlled  Satisfied with current treatment? yes Duration of hypertension: chronic BP monitoring frequency:  not checking BP range:  BP medication side effects:  no Medication compliance: stable Previous BP meds:  Aspirin: {Blank single:19197::"yes","no"} Recurrent headaches: {Blank single:19197::"yes","no"} Visual changes: {Blank single:19197::"yes","no"} Palpitations: {Blank single:19197::"yes","no"} Dyspnea: {Blank single:19197::"yes","no"} Chest pain: {Blank single:19197::"yes","no"} Lower extremity edema: {Blank single:19197::"yes","no"} Dizzy/lightheaded: {Blank single:19197::"yes","no"}   ANXIETY/DEPRESSION- has a lot going on right now. Has been feeling a little blunted. Duration: chronic Status:exacerbated Anxious mood: yes  Excessive worrying: yes Irritability: no  Sweating: no Nausea: no Palpitations:no Hyperventilation: no Panic attacks: no Agoraphobia: no  Obscessions/compulsions: no Depressed mood: yes    08/08/2022    9:31 AM 01/08/2022    9:04 AM 07/31/2021    9:27 AM 07/11/2021    9:34 AM 04/17/2021    3:22 PM  Depression screen PHQ 2/9  Decreased Interest '1 1 1 1 '$ 0  Down, Depressed, Hopeless '1 1 1 1 '$ 0  PHQ - 2 Score '2 2 2 2 '$ 0  Altered sleeping '3 2 1 3 2  '$ Tired, decreased energy '2 1 2 2 1  '$ Change in appetite 2 0 0 0 0  Feeling bad or failure about yourself  0 0 0 0 0  Trouble concentrating 2 1 0 2 2  Moving slowly or fidgety/restless 2 0 0 0 0  Suicidal thoughts 0 0 0 0 0   PHQ-9 Score '13 6 5 9 5  '$ Difficult doing work/chores Very difficult Somewhat difficult Not difficult at all Not difficult at all    Anhedonia: no Weight changes: no Insomnia: yes   Hypersomnia: no Fatigue/loss of energy: yes Feelings of worthlessness: no Feelings of guilt: no Impaired concentration/indecisiveness: yes Suicidal ideations: no  Crying spells: no Recent Stressors/Life Changes: yes   Relationship problems: no   Family stress: yes     Financial stress: no    Job stress: no    Recent death/loss: no   She currently lives with: husband Menopausal Symptoms: no  Depression Screen done today and results listed below:     08/08/2022    9:31 AM 01/08/2022    9:04 AM 07/31/2021    9:27 AM 07/11/2021    9:34 AM 04/17/2021    3:22 PM  Depression screen PHQ 2/9  Decreased Interest '1 1 1 1 '$ 0  Down, Depressed, Hopeless '1 1 1 1 '$ 0  PHQ - 2 Score '2 2 2 2 '$ 0  Altered sleeping '3 2 1 3 2  '$ Tired, decreased energy '2 1 2 2 1  '$ Change in appetite 2 0 0 0 0  Feeling bad or failure about yourself  0 0 0 0 0  Trouble concentrating 2 1 0 2 2  Moving slowly or fidgety/restless 2 0 0 0 0  Suicidal thoughts 0 0 0 0 0  PHQ-9 Score '13 6 5 9 5  '$ Difficult doing work/chores Very difficult Somewhat difficult Not  difficult at all Not difficult at all     Past Medical History:  Past Medical History:  Diagnosis Date   Arthritis 2016   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 EF 50-55% normal coronaries   Complete heart block (HCC)    Depression    Dysautonomia (HCC)    Dyspnea    CT negative for PE, 2007   Hypertension    Insomnia    OCD (obsessive compulsive disorder)    SVT (supraventricular tachycardia)    s/p ablation a. c/b AV nod ablation requiring pacemaker    Surgical History:  Past Surgical History:  Procedure Laterality Date   ANTERIOR CERVICAL DECOMP/DISCECTOMY FUSION N/A 09/20/2021   Procedure: Anterior  Cervical Discectomy Fusion  Cervical Four-Five, Cervical Five-Six, Cervical Six-Seven;  Surgeon: Dawley, Theodoro Doing, DO;  Location: [facility];  Service: Neurosurgery;  Laterality: N/A;  3C   BREAST BIOPSY Right 1998   BREAST BIOPSY Right 2004   BREAST LUMPECTOMY     RIGHT -BREAST    COLONOSCOPY WITH PROPOFOL N/A 09/07/2019   Procedure: COLONOSCOPY WITH PROPOFOL;  Surgeon: Lucilla Lame, MD;  Location: [facility];  Service: Endoscopy;  Laterality: N/A;   ENDOMETRIAL ABLATION  2005   INSERT / REPLACE / REMOVE PACEMAKER     PELVIC LAPAROSCOPY  1990   PERMANENT PACEMAKER GENERATOR CHANGE N/A 07/13/2014   MDT MRI compatible dual chamber pacemaker implanted by Dr Jeralene Peters  2005   TUBAL LIGATION  2003    Medications:  Current Outpatient Medications on File Prior to Visit  Medication Sig   aspirin EC 81 MG tablet Take 81 mg by mouth daily. Swallow whole.   celecoxib (CELEBREX) 100 MG capsule Take 100 mg by mouth 2 (two) times daily.   cyclobenzaprine (FLEXERIL) 5 MG tablet Take 5 mg by mouth 3 (three) times daily as needed for muscle spasms.   diltiazem (CARDIZEM CD) 120 MG 24 hr capsule Take 1 capsule (120 mg total) by mouth daily.   FIBER ADULT GUMMIES PO Take 2 each by mouth daily. Vita Fusion Fiberwell   hydroxychloroquine (PLAQUENIL) 200 MG tablet Take 400 mg by mouth daily.   LORazepam (ATIVAN) 0.5 MG tablet TAKE 1 TO 2 TABLETS(0.5 TO 1 MG) BY MOUTH DAILY AS NEEDED FOR ANXIETY   methocarbamol (ROBAXIN) 500 MG tablet Take 500 mg by mouth 3 (three) times daily as needed.   Multiple Vitamins-Minerals (MULTIVITAMIN WITH MINERALS) tablet Take 1 tablet by mouth daily.   traMADol (ULTRAM) 50 MG tablet Take 50 mg by mouth every 6 (six) hours as needed.   venlafaxine XR (EFFEXOR XR) 75 MG 24 hr capsule Take 1 tab daily with her '150mg'$  for a total of '225mg'$  daily   venlafaxine XR (EFFEXOR-XR) 150 MG 24 hr capsule TAKE 1 CAPSULE(150 MG) BY MOUTH DAILY WITH BREAKFAST   No current facility-administered  medications on file prior to visit.    Allergies:  Allergies  Allergen Reactions   Toprol Xl [Metoprolol Succinate] Photosensitivity    Social History:  Social History   Socioeconomic History   Marital status: Married    Spouse name: Not on file   Number of children: Not on file   Years of education: Not on file   Highest education level: Bachelor's degree (e.g., BA, AB, BS)  Occupational History   Occupation: disability   Tobacco Use   Smoking status: Never   Smokeless  tobacco: Never  Vaping Use   Vaping Use: Never used  Substance and Sexual Activity   Alcohol use: No   Drug use: No   Sexual activity: Yes    Birth control/protection: Surgical    Comment: Uterine ablarion 2002  Other Topics Concern   Not on file  Social History Narrative   Work with daughters school    Social Determinants of Health   Financial Resource Strain: Low Risk  (07/31/2021)   Overall Financial Resource Strain (CARDIA)    Difficulty of Paying Living Expenses: Not hard at all  Food Insecurity: No Food Insecurity (07/31/2021)   Hunger Vital Sign    Worried About Running Out of Food in the Last Year: Never true    [place] in the Last Year: Never true  Transportation Needs: No Transportation Needs (07/31/2021)   PRAPARE - Transportation    Lack of Transportation (Medical): No    Lack of Transportation (Non-Medical): No  Physical Activity: Insufficiently Active (07/31/2021)   Exercise Vital Sign    Days of Exercise per Week: 2 days    Minutes of Exercise per Session: 20 min  Stress: No Stress Concern Present (07/31/2021)   [place]    Feeling of Stress : Only a little  Social Connections: Socially Integrated (07/31/2021)   Social Connection and Isolation Panel [NHANES]    Frequency of Communication with Friends and Family: Three times a week    Frequency of Social Gatherings with Friends and Family: Three times a week     Attends Religious Services: More than 4 times per year    Active Member of Clubs or Organizations: Yes    Attends Archivist Meetings: More than 4 times per year    Marital Status: Married  Human resources officer Violence: Not At Risk (07/31/2021)   Humiliation, Afraid, Rape, and Kick questionnaire    Fear of Current or Ex-Partner: No    Emotionally Abused: No    Physically Abused: No    Sexually Abused: No   Social History   Tobacco Use  Smoking Status Never  Smokeless Tobacco Never   Social History   Substance and Sexual Activity  Alcohol Use No    Family History:  Family History  Problem Relation Age of Onset   Anxiety disorder Mother    Arthritis Mother    Obesity Mother    Cervical cancer Mother    Hypertension Father    Cancer Father 25       COLON   Diabetes Father    Anxiety disorder Sister    Breast cancer Maternal Aunt        mastectomy   Cancer Maternal Aunt    Breast cancer Paternal Aunt    Cancer Paternal Aunt    Breast cancer Paternal Aunt    Dementia Maternal Grandmother    Hypertension Maternal Grandfather    Diabetes Paternal Grandmother    Breast cancer Paternal Grandmother    Arthritis Paternal Grandmother    Cancer Paternal Grandmother    Diabetes Paternal Grandfather    Cancer Paternal Grandfather     Past medical history, surgical history, medications, allergies, family history and social history reviewed with patient today and changes made to appropriate areas of the chart.   ROS All other ROS negative except what is listed above and in the HPI.      Objective:    BP 123/85   Pulse 82   Temp 98  F (36.7 C) (Oral)   Ht 5' 4.25" (1.632 m)   Wt 166 lb 4.8 oz (75.4 kg)   SpO2 99%   BMI 28.32 kg/m   Wt Readings from Last 3 Encounters:  08/08/22 166 lb 4.8 oz (75.4 kg)  02/28/22 159 lb (72.1 kg)  01/29/22 154 lb (69.9 kg)    Physical Exam  Results for orders placed or performed in visit on 05/08/22  CUP PACEART REMOTE  DEVICE CHECK  Result Value Ref Range   Date Time Interrogation Session [id number]    Pulse Generator Manufacturer MERM    Pulse Gen Model [id number] Advisa DR MRI    Pulse Gen Serial Number [id number]    Clinic Name [facility]    Implantable Pulse Generator Type Implantable Pulse Generator    Implantable Pulse Generator Implant Date [id number]    Implantable Lead Manufacturer MERM    Implantable Lead Model 5076 CapSureFix Novus    Implantable Lead Serial Number [id number]    Implantable Lead Implant Date IL:4119692    Implantable Lead Location Detail 1 APPENDAGE    Implantable Lead Location [id number]    Implantable Lead Connection Status [id number]    Implantable Lead Manufacturer MERM    Implantable Lead Model 5076 CapSureFix Novus    Implantable Lead Serial Number [id number]    Implantable Lead Implant Date IL:4119692    Implantable Lead Location Detail 1 APEX    Implantable Lead Location [id number]    Implantable Lead Connection Status [id number]    Lead Channel Setting Sensing Sensitivity 2 mV   Lead Channel Setting Pacing Amplitude 2 V   Lead Channel Setting Pacing Pulse Width 0.4 ms   Lead Channel Setting Pacing Amplitude 2.5 V   Zone Setting Status 755011    Zone Setting Status [phone]    Lead Channel Impedance Value 456 ohm   Lead Channel Impedance Value 285 ohm   Lead Channel Sensing Intrinsic Amplitude 2.375 mV   Lead Channel Sensing Intrinsic Amplitude 2.375 mV   Lead Channel Pacing Threshold Amplitude 1 V   Lead Channel Pacing Threshold Pulse Width 0.4 ms   Lead Channel Impedance Value 437 ohm   Lead Channel Impedance Value 380 ohm   Lead Channel Sensing Intrinsic Amplitude 3.375 mV   Lead Channel Sensing Intrinsic Amplitude 3.375 mV   Lead Channel Pacing Threshold Amplitude 0.75 V   Lead Channel Pacing Threshold Pulse Width 0.4 ms   Battery Status OK    Battery Remaining Longevity 16 mo   Battery Voltage 2.91 V   Brady Statistic RA Percent Paced 1.12 %   Brady Statistic RV  Percent Paced 99.98 %   Brady Statistic AP VP Percent 1.11 %   Brady Statistic AS VP Percent 98.87 %   Brady Statistic AP VS Percent 0 %   Brady Statistic AS VS Percent 0.01 %      Assessment & Plan:   Problem List Items Addressed This Visit   None    Follow up plan: No follow-ups on file.   LABORATORY TESTING:  - Pap smear: {Blank [id number] done","not applicable","up to date","done elsewhere"}  IMMUNIZATIONS:   - Tdap: Tetanus vaccination status reviewed: {tetanus status:315746}. - Influenza: {Blank single:19197::"Up to date","Administered today","Postponed to flu season","Refused","Given elsewhere"} - Pneumovax: {Blank single:19197::"Up to date","Administered today","Not applicable","Refused","Given elsewhere"} - Prevnar: {Blank single:19197::"Up to date","Administered today","Not applicable","Refused","Given elsewhere"} - COVID: {Blank single:19197::"Up to date","Administered today","Not applicable","Refused","Given elsewhere"} - HPV: {Blank single:19197::"Up to date","Administered today","Not applicable","Refused","Given elsewhere"} - Shingrix vaccine: {Blank single:19197::"Up to date","Administered today","Not applicable","Refused","Given elsewhere"}  SCREENING: -Mammogram: {Blank single:19197::"Up to date","Ordered today","Not applicable","Refused","Done elsewhere"}  - Colonoscopy: {Blank single:19197::"Up to date","Ordered today","Not applicable","Refused","Done elsewhere"}  - Bone Density: {Blank single:19197::"Up to date","Ordered today","Not applicable","Refused","Done elsewhere"}  -Hearing Test: {Blank single:19197::"Up to date","Ordered today","Not applicable","Refused","Done elsewhere"}  -Spirometry: {Blank single:19197::"Up to date","Ordered today","Not applicable","Refused","Done elsewhere"}   PATIENT COUNSELING:   Advised to take 1 mg of folate supplement per day if capable of pregnancy.   Sexuality: Discussed sexually transmitted diseases, partner  selection, use of condoms, avoidance of unintended pregnancy  and contraceptive alternatives.   Advised to avoid cigarette smoking.  I discussed with the patient that most people either abstain from alcohol or drink within safe limits (<=14/week and <=4 drinks/occasion for males, <=7/weeks and <= 3 drinks/occasion for females) and that the risk for alcohol disorders and other health effects rises proportionally with the number of drinks per week and how often a drinker exceeds daily limits.  Discussed cessation/primary prevention of drug use and availability of treatment for abuse.   Diet: Encouraged to adjust caloric intake to maintain  or achieve ideal body weight, to reduce intake of dietary saturated fat and total fat, to limit sodium intake by avoiding high sodium foods and not adding table salt, and to maintain adequate dietary potassium and calcium preferably from fresh fruits, vegetables, and low-fat dairy products.    stressed the importance of regular exercise  Injury prevention: Discussed safety belts, safety helmets, smoke detector, smoking near bedding or upholstery.   Dental health: Discussed importance of regular tooth brushing, flossing, and dental visits.    NEXT PREVENTATIVE PHYSICAL DUE IN 1 YEAR. No follow-ups on file.

## 2022-08-09 ENCOUNTER — Ambulatory Visit: Payer: Medicare PPO

## 2022-08-09 DIAGNOSIS — I442 Atrioventricular block, complete: Secondary | ICD-10-CM

## 2022-08-09 LAB — CBC WITH DIFFERENTIAL/PLATELET
Basophils Absolute: 0 10*3/uL (ref 0.0–0.2)
Basos: 1 %
EOS (ABSOLUTE): 0.1 10*3/uL (ref 0.0–0.4)
Eos: 2 %
Hematocrit: 46.6 % (ref 34.0–46.6)
Hemoglobin: 15.5 g/dL (ref 11.1–15.9)
Immature Grans (Abs): 0 10*3/uL (ref 0.0–0.1)
Immature Granulocytes: 0 %
Lymphocytes Absolute: 1.9 10*3/uL (ref 0.7–3.1)
Lymphs: 34 %
MCH: 31.2 pg (ref 26.6–33.0)
MCHC: 33.3 g/dL (ref 31.5–35.7)
MCV: 94 fL (ref 79–97)
Monocytes Absolute: 0.4 10*3/uL (ref 0.1–0.9)
Monocytes: 8 %
Neutrophils Absolute: 3.1 10*3/uL (ref 1.4–7.0)
Neutrophils: 55 %
Platelets: 242 10*3/uL (ref 150–450)
RBC: 4.97 x10E6/uL (ref 3.77–5.28)
RDW: 12 % (ref 11.7–15.4)
WBC: 5.5 10*3/uL (ref 3.4–10.8)

## 2022-08-09 LAB — COMPREHENSIVE METABOLIC PANEL WITH GFR
ALT: 22 [IU]/L (ref 0–32)
AST: 29 [IU]/L (ref 0–40)
Albumin/Globulin Ratio: 2.5 — ABNORMAL HIGH (ref 1.2–2.2)
Albumin: 4.5 g/dL (ref 3.8–4.9)
Alkaline Phosphatase: 82 [IU]/L (ref 44–121)
BUN/Creatinine Ratio: 20 (ref 9–23)
BUN: 17 mg/dL (ref 6–24)
Bilirubin Total: 0.4 mg/dL (ref 0.0–1.2)
CO2: 25 mmol/L (ref 20–29)
Calcium: 10.1 mg/dL (ref 8.7–10.2)
Chloride: 99 mmol/L (ref 96–106)
Creatinine, Ser: 0.87 mg/dL (ref 0.57–1.00)
Globulin, Total: 1.8 g/dL (ref 1.5–4.5)
Glucose: 91 mg/dL (ref 70–99)
Potassium: 4.7 mmol/L (ref 3.5–5.2)
Sodium: 137 mmol/L (ref 134–144)
Total Protein: 6.3 g/dL (ref 6.0–8.5)
eGFR: 81 mL/min/{1.73_m2}

## 2022-08-09 LAB — LIPID PANEL W/O CHOL/HDL RATIO
Cholesterol, Total: 196 mg/dL (ref 100–199)
HDL: 75 mg/dL
LDL Chol Calc (NIH): 102 mg/dL — ABNORMAL HIGH (ref 0–99)
Triglycerides: 106 mg/dL (ref 0–149)
VLDL Cholesterol Cal: 19 mg/dL (ref 5–40)

## 2022-08-09 LAB — TSH: TSH: 1.11 u[IU]/mL (ref 0.450–4.500)

## 2022-08-10 LAB — CUP PACEART REMOTE DEVICE CHECK
Battery Remaining Longevity: 15 mo
Battery Voltage: 2.89 V
Brady Statistic AP VP Percent: 2.42 %
Brady Statistic AP VS Percent: 0 %
Brady Statistic AS VP Percent: 97.57 %
Brady Statistic AS VS Percent: 0.01 %
Brady Statistic RA Percent Paced: 2.42 %
Brady Statistic RV Percent Paced: 99.99 %
Date Time Interrogation Session: 20240308153555
Implantable Lead Connection Status: 753985
Implantable Lead Connection Status: 753985
Implantable Lead Implant Date: 20070319
Implantable Lead Implant Date: 20070319
Implantable Lead Location: 753859
Implantable Lead Location: 753860
Implantable Lead Model: 5076
Implantable Lead Model: 5076
Implantable Pulse Generator Implant Date: 20160210
Lead Channel Impedance Value: 285 Ohm
Lead Channel Impedance Value: 399 Ohm
Lead Channel Impedance Value: 456 Ohm
Lead Channel Impedance Value: 456 Ohm
Lead Channel Pacing Threshold Amplitude: 0.75 V
Lead Channel Pacing Threshold Amplitude: 1 V
Lead Channel Pacing Threshold Pulse Width: 0.4 ms
Lead Channel Pacing Threshold Pulse Width: 0.4 ms
Lead Channel Sensing Intrinsic Amplitude: 2.25 mV
Lead Channel Sensing Intrinsic Amplitude: 2.25 mV
Lead Channel Sensing Intrinsic Amplitude: 4.875 mV
Lead Channel Sensing Intrinsic Amplitude: 4.875 mV
Lead Channel Setting Pacing Amplitude: 2 V
Lead Channel Setting Pacing Amplitude: 2.5 V
Lead Channel Setting Pacing Pulse Width: 0.4 ms
Lead Channel Setting Sensing Sensitivity: 2 mV
Zone Setting Status: 755011
Zone Setting Status: 755011

## 2022-08-15 DIAGNOSIS — M25551 Pain in right hip: Secondary | ICD-10-CM | POA: Diagnosis not present

## 2022-09-02 DIAGNOSIS — M960 Pseudarthrosis after fusion or arthrodesis: Secondary | ICD-10-CM | POA: Diagnosis not present

## 2022-09-02 DIAGNOSIS — M4802 Spinal stenosis, cervical region: Secondary | ICD-10-CM | POA: Diagnosis not present

## 2022-09-02 DIAGNOSIS — Z6827 Body mass index (BMI) 27.0-27.9, adult: Secondary | ICD-10-CM | POA: Diagnosis not present

## 2022-09-05 ENCOUNTER — Other Ambulatory Visit: Payer: Self-pay | Admitting: Neurological Surgery

## 2022-09-05 DIAGNOSIS — M960 Pseudarthrosis after fusion or arthrodesis: Secondary | ICD-10-CM

## 2022-09-06 NOTE — Progress Notes (Signed)
Remote pacemaker transmission.   

## 2022-09-09 ENCOUNTER — Encounter: Payer: Self-pay | Admitting: Family Medicine

## 2022-09-09 NOTE — Telephone Encounter (Signed)
Please get handicap plaqcuard and window tint paper work printed and I'll sign

## 2022-09-10 ENCOUNTER — Ambulatory Visit
Admission: RE | Admit: 2022-09-10 | Discharge: 2022-09-10 | Disposition: A | Discharge status: Home / Self Care | Payer: Medicare PPO | Source: Ambulatory Visit | Attending: Neurological Surgery | Admitting: Neurological Surgery

## 2022-09-10 DIAGNOSIS — M4802 Spinal stenosis, cervical region: Secondary | ICD-10-CM | POA: Diagnosis not present

## 2022-09-10 DIAGNOSIS — M960 Pseudarthrosis after fusion or arthrodesis: Secondary | ICD-10-CM

## 2022-09-10 DIAGNOSIS — M47812 Spondylosis without myelopathy or radiculopathy, cervical region: Secondary | ICD-10-CM | POA: Diagnosis not present

## 2022-09-10 DIAGNOSIS — M4312 Spondylolisthesis, cervical region: Secondary | ICD-10-CM | POA: Diagnosis not present

## 2022-09-10 DIAGNOSIS — M47813 Spondylosis without myelopathy or radiculopathy, cervicothoracic region: Secondary | ICD-10-CM | POA: Diagnosis not present

## 2022-09-11 NOTE — Telephone Encounter (Signed)
Pt came by and filled out her portion of the Handicap Placard form and Tinted window waiver form and we made a copy for the pts chart.

## 2022-09-16 ENCOUNTER — Telehealth: Payer: Self-pay

## 2022-09-16 ENCOUNTER — Telehealth: Payer: Self-pay | Admitting: *Deleted

## 2022-09-16 NOTE — Telephone Encounter (Signed)
   Name: Angela [name]  DOB: [birth year]  MRN: [id number]  Primary Cardiologist: None  Chart reviewed as part of pre-operative protocol coverage. Because of Angela [name] past medical history and time since last visit, she will require a follow-up telephone visit in order to better assess preoperative cardiovascular risk.  Pre-op covering staff: - Please schedule appointment and call patient to inform them. If patient already had an upcoming appointment within acceptable timeframe, please add "pre-op clearance" to the appointment notes so provider is aware. - Please contact requesting surgeon's office via preferred method (i.e, phone, fax) to inform them of need for appointment prior to surgery.  Unclear why patient is on asa with no hx of CAD. Should be okay to stop for 5-7 days as long as patient is asymptomatic at the time of the phone call.   Sharlene Dory, PA-C  09/16/2022, 3:42 PM

## 2022-09-16 NOTE — Telephone Encounter (Signed)
Spoke with patient who is agreeable to do a tele visit on 5/1 at 10:20 am. Med rec and consent have been done. I will route to requesting provider's office for pre-op clearance updates.

## 2022-09-16 NOTE — Telephone Encounter (Signed)
   Pre-operative Risk Assessment    Patient Name: Angela [name]  DOB: Jul 12, 1970 MRN: [id number]      Request for Surgical Clearance    Procedure:   CERVICAL FUSION  Date of Surgery:  Clearance TBD                                 Surgeon:  DR. Monia Pouch Surgeon's Group or Practice Name:  [facility] Phone number:  [phone] Fax number:  [phone]   Type of Clearance Requested:   - Medical  - Pharmacy:  Hold Aspirin NOT INDICATED   Type of Anesthesia:  General    Additional requests/questions:    [name]   09/16/2022, 2:56 PM

## 2022-09-16 NOTE — Telephone Encounter (Signed)
  Patient Consent for Virtual Visit        Angela [name] has provided verbal consent on 09/16/2022 for a virtual visit (video or telephone).   CONSENT FOR VIRTUAL VISIT FOR:  Angela [name]  By participating in this virtual visit I agree to the following:  I hereby voluntarily request, consent and authorize [facility] and its employed or contracted physicians, physician assistants, nurse practitioners or other licensed health care professionals (the Practitioner), to provide me with telemedicine health care services (the "Services") as deemed necessary by the treating Practitioner. I acknowledge and consent to receive the Services by the Practitioner via telemedicine. I understand that the telemedicine visit will involve communicating with the Practitioner through live audiovisual communication technology and the disclosure of certain medical information by electronic transmission. I acknowledge that I have been given the opportunity to request an in-person assessment or other available alternative prior to the telemedicine visit and am voluntarily participating in the telemedicine visit.  I understand that I have the right to withhold or withdraw my consent to the use of telemedicine in the course of my care at any time, without affecting my right to future care or treatment, and that the Practitioner or I may terminate the telemedicine visit at any time. I understand that I have the right to inspect all information obtained and/or recorded in the course of the telemedicine visit and may receive copies of available information for a reasonable fee.  I understand that some of the potential risks of receiving the Services via telemedicine include:  Delay or interruption in medical evaluation due to technological equipment failure or disruption; Information transmitted may not be sufficient (e.g. poor resolution of images) to allow for appropriate medical decision making by the  Practitioner; and/or  In rare instances, security protocols could fail, causing a breach of personal health information.  Furthermore, I acknowledge that it is my responsibility to provide information about my medical history, conditions and care that is complete and accurate to the best of my ability. I acknowledge that Practitioner's advice, recommendations, and/or decision may be based on factors not within their control, such as incomplete or inaccurate data provided by me or distortions of diagnostic images or specimens that may result from electronic transmissions. I understand that the practice of medicine is not an exact science and that Practitioner makes no warranties or guarantees regarding treatment outcomes. I acknowledge that a copy of this consent can be made available to me via my patient portal [facility] MyChart), or I can request a printed copy by calling the office of [facility].    I understand that my insurance will be billed for this visit.   I have read or had this consent read to me. I understand the contents of this consent, which adequately explains the benefits and risks of the Services being provided via telemedicine.  I have been provided ample opportunity to ask questions regarding this consent and the Services and have had my questions answered to my satisfaction. I give my informed consent for the services to be provided through the use of telemedicine in my medical care

## 2022-09-25 ENCOUNTER — Ambulatory Visit
Admission: RE | Admit: 2022-09-25 | Discharge: 2022-09-25 | Disposition: A | Discharge status: Home / Self Care | Payer: Medicare PPO | Source: Ambulatory Visit | Attending: Family Medicine | Admitting: Family Medicine

## 2022-09-25 DIAGNOSIS — Z1231 Encounter for screening mammogram for malignant neoplasm of breast: Secondary | ICD-10-CM | POA: Diagnosis not present

## 2022-10-01 NOTE — Progress Notes (Unsigned)
Virtual Visit via Telephone Note   Because of Angela [name] co-morbid illnesses, she is at least at moderate risk for complications without adequate follow up.  This format is felt to be most appropriate for this patient at this time.  The patient did not have access to video technology/had technical difficulties with video requiring transitioning to audio format only (telephone).  All issues noted in this document were discussed and addressed.  No physical exam could be performed with this format.  Please refer to the patient's chart for her consent to telehealth for [facility].  Evaluation Performed:  Preoperative cardiovascular risk assessment _____________   Date:  10/01/2022   Patient ID:  Angela [name], DOB [birth year], MRN [id number] Patient Location:  Home Provider location:   Office  Primary Care Provider:  Dorcas Carrow, DO Primary Cardiologist:  None  Chief Complaint / Patient Profile   52 y.o. y/o female with a h/o CHB s/p PPM, POTS, HTN, anxiety, SVT who is pending cervical fusion and presents today for telephonic preoperative cardiovascular risk assessment.  History of Present Illness    Angela [name] is a 52 y.o. female who presents via audio/video conferencing for a telehealth visit today.  Pt was last seen in cardiology clinic on 02/28/2022 by Dr. Graciela Husbands.  At that time Angela [name] was doing well and was walking a few miles a day . She had some complaint of exertional chest pain and was placed on calcium channel blocker.The patient is now pending procedure as outlined above. Since her last visit, she reports that her chest discomfort is still occurring with emotional stress but she denies any exertional discomfort.  In her previous visit with Dr. Graciela Husbands in September he suggested that she have a calcium scoring completed.  She was advised that calcium scoring will need to be completed prior to her undergoing scheduled cervical fusion procedure.   I will enter a no charge for today's visit and reach out to our CMA to have her test scheduled as soon as possible.    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 EF 50-55% normal coronaries   Complete heart block (HCC)    Depression    Dysautonomia (HCC)    Dyspnea    CT negative for PE, 2007   Hypertension    Insomnia    OCD (obsessive compulsive disorder)    SVT (supraventricular tachycardia)    s/p ablation a. c/b AV nod ablation requiring pacemaker   Past Surgical History:  Procedure Laterality Date   ANTERIOR CERVICAL DECOMP/DISCECTOMY FUSION N/A 09/20/2021   Procedure: Anterior Cervical Discectomy Fusion  Cervical Four-Five, Cervical Five-Six, Cervical Six-Seven;  Surgeon: Dawley, Alan Mulder, DO;  Location: [facility] OR;  Service: Neurosurgery;  Laterality: N/A;  3C   BREAST BIOPSY Right 1998   BREAST BIOPSY Right 2004   BREAST LUMPECTOMY     RIGHT -BREAST    COLONOSCOPY WITH PROPOFOL N/A 09/07/2019   Procedure: COLONOSCOPY WITH PROPOFOL;  Surgeon: Midge Minium, MD;  Location: [facility] ENDOSCOPY;  Service: Endoscopy;  Laterality: N/A;   ENDOMETRIAL ABLATION  2005   INSERT / REPLACE / REMOVE PACEMAKER     PELVIC LAPAROSCOPY  1990   PERMANENT PACEMAKER GENERATOR CHANGE N/A 07/13/2014   MDT MRI compatible dual chamber pacemaker implanted by  Dr Graciela Husbands   [facility]  2005   TUBAL LIGATION  2003    Allergies  Allergies  Allergen Reactions   Toprol Xl [Metoprolol Succinate] Photosensitivity    Home Medications    Prior to Admission medications   Medication Sig Start Date End Date Taking? Authorizing Provider  aspirin EC 81 MG tablet Take 81 mg by mouth daily. Swallow whole.    [provider]  celecoxib (CELEBREX) 100 MG capsule Take 100 mg by mouth 2 (two) times daily. 12/20/21   [provider]  cyclobenzaprine (FLEXERIL) 5 MG  tablet Take 5 mg by mouth 3 (three) times daily as needed for muscle spasms. 05/01/20   [provider]  diltiazem (CARDIZEM CD) 120 MG 24 hr capsule Take 1 capsule (120 mg total) by mouth daily. 02/28/22   Duke Salvia, MD  FIBER ADULT GUMMIES PO Take 2 each by mouth daily. [employer]well    [provider]  hydroxychloroquine (PLAQUENIL) 200 MG tablet Take 400 mg by mouth daily. 12/20/21   [provider]  LORazepam (ATIVAN) 0.5 MG tablet TAKE 1 TO 2 TABLETS(0.5 TO 1 MG) BY MOUTH DAILY AS NEEDED FOR ANXIETY 08/08/22   Johnson, Megan P, DO  methocarbamol (ROBAXIN) 500 MG tablet Take 500 mg by mouth 3 (three) times daily as needed. 12/20/21   [provider]  Multiple Vitamins-Minerals (MULTIVITAMIN WITH MINERALS) tablet Take 1 tablet by mouth daily.    [provider]  traMADol (ULTRAM) 50 MG tablet Take 50 mg by mouth every 6 (six) hours as needed. 12/08/21   [provider]  venlafaxine XR (EFFEXOR XR) 75 MG 24 hr capsule Take 1 tab daily with her 150mg  for a total of 225mg  daily 08/08/22   Olevia Perches P, DO  venlafaxine XR (EFFEXOR-XR) 150 MG 24 hr capsule TAKE 1 CAPSULE(150 MG) BY MOUTH DAILY WITH BREAKFAST 08/08/22   Dorcas Carrow, DO    Physical Exam    Vital Signs:  JENAVIEVE FREDA does not have vital signs available for review today.  Given telephonic nature of communication, physical exam is limited. AAOx3. NAD. Normal affect.  Speech and respirations are unlabored.  Accessory Clinical Findings    None  Assessment & Plan    1.  Preoperative Cardiovascular Risk Assessment:  Clearance not granted pending further testing  The patient was advised that if she develops new symptoms prior to surgery to contact our office to arrange for a follow-up visit, and she verbalized understanding.   A copy of this note will be routed to requesting surgeon.  Time:   Today, I have spent 8 minutes with the patient with telehealth  technology discussing medical history, symptoms, and management plan.     Napoleon Form, Leodis Rains, NP  10/01/2022, 8:46 AM

## 2022-10-02 ENCOUNTER — Telehealth: Payer: Self-pay | Admitting: Nurse Practitioner

## 2022-10-02 ENCOUNTER — Ambulatory Visit: Payer: Medicare PPO | Attending: Cardiovascular Disease

## 2022-10-02 DIAGNOSIS — Z01810 Encounter for preprocedural cardiovascular examination: Secondary | ICD-10-CM

## 2022-10-02 NOTE — Telephone Encounter (Addendum)
Patient was contacted for virtual visit today. She was previously seen by Dr. Graciela Husbands on in September with complaints of chest discomfort and was placed on Cardizem.  During today's call patient continued to have chest discomfort with a similar description of nonexertional.  She described the discomfort as sharp and tight that occurs with stress and emotional upset.  In September Dr. Graciela Husbands requested she have a calcium scoring completed that was not done.  I explained to her that clearance will not be granted today and she will need to complete her calcium scoring prior to clearance being granted.  I will enter no charge for today's virtual visit.  Patient was in agreement with the above-mentioned plan and OR precautions were advised if pain changes in intensity.    Robin Searing, NP

## 2022-10-02 NOTE — Telephone Encounter (Signed)
Pt is needing as cardiac score CT to be done due to chest pain and pre op clearance, pt then has a f/u appt in office pre the pre op APP. I will update the requesting office.   Pt has been schedule for 10-04-22 @ 4 pm @ the [facility] / and f/u with Alden Server on 5/9

## 2022-10-02 NOTE — Telephone Encounter (Signed)
I have placed order for Cardiac CT Score. BMET order has been placed as well. Labs to be done within 7 days of CT. I will send message to check out to schedule the CT and lab work. I will upadate the pre op APP orders placed.

## 2022-10-02 NOTE — Telephone Encounter (Signed)
Pt has been schedule for 10-04-22 @ 4 pm @ the [facility] / and f/u with Alden Server on 5/9

## 2022-10-02 NOTE — Addendum Note (Signed)
Addended by: Tarri Fuller on: 10/02/2022 11:05 AM   Modules accepted: Orders

## 2022-10-04 ENCOUNTER — Ambulatory Visit
Admission: RE | Admit: 2022-10-04 | Discharge: 2022-10-04 | Disposition: A | Discharge status: Home / Self Care | Payer: Medicare PPO | Source: Ambulatory Visit | Attending: Nurse Practitioner | Admitting: Nurse Practitioner

## 2022-10-04 ENCOUNTER — Encounter: Payer: Self-pay | Admitting: Internal Medicine

## 2022-10-04 DIAGNOSIS — Z01810 Encounter for preprocedural cardiovascular examination: Secondary | ICD-10-CM | POA: Insufficient documentation

## 2022-10-08 ENCOUNTER — Telehealth: Payer: Self-pay | Admitting: *Deleted

## 2022-10-08 NOTE — Telephone Encounter (Signed)
I was instructed by the pre op APP Carlos Levering, NP that Robin Searing, NP stated to cancel app with him 10/10/22 and to schedule a tele appt. I s/w the pt and she was fine with cancelling appt with NP and scheduling a tele appt. I had no tele appts until 5//9/24 which is when pt was scheduled to see Robin Searing, NP in the office, however I overbooked the pre op schedule for tomorrow. Pt is frustrated and said that she has been waiting a long time to get this procedure done. Pt was grateful for the help I was able to give her today. Pt asked if Dr. Graciela Husbands was in the office. I said no, I asked did she need Mindi Junker, RN Dr. Odessa Fleming nurse to call her and she yes please.

## 2022-10-08 NOTE — Telephone Encounter (Signed)
I was instructed by the pre op APP Deborah Wittenborn, NP that Ernest Dick, NP stated to cancel app with him 10/10/22 and to schedule a tele appt. I s/w the pt and she was fine with cancelling appt with NP and scheduling a tele appt. I had no tele appts until 5//9/24 which is when pt was scheduled to see Ernest Dick, NP in the office, however I overbooked the pre op schedule for tomorrow. Pt is frustrated and said that she has been waiting a long time to get this procedure done. Pt was grateful for the help I was able to give her today. Pt asked if Dr. Klein was in the office. I said no, I asked did she need Marsha, RN Dr. Klein's nurse to call her and she yes please.      

## 2022-10-08 NOTE — Telephone Encounter (Signed)
Patient states she does not feel as though it is necessary to do another virtuakl appoitnment. She states she has already went through the process and does not feel like it is necessary. She sthen states she will do whatever is needed but she wanted me to ask the provider if the virtual appointment is necessary.   I advised the patient that I would speak with Alden Server and get back to her. She voiced understanding.

## 2022-10-08 NOTE — Telephone Encounter (Signed)
   Name: Angela [name]  DOB: Jan 04, 1971  MRN: [id number]  Primary Cardiologist: None   Preoperative team, please contact this patient and set up a phone call appointment for further preoperative risk assessment. Please obtain consent and complete medication review. Thank you for your help.  I confirm that guidance regarding antiplatelet and oral anticoagulation therapy has been completed and, if necessary, noted below.  Ideally aspirin should be continued without interruption, however if the bleeding risk is too great, aspirin may be held for 5-7 days prior to surgery. Please resume aspirin post operatively when it is felt to be safe from a bleeding standpoint.     Carlos Levering, NP 10/08/2022, 3:44 PM [facility]

## 2022-10-08 NOTE — Telephone Encounter (Signed)
Patient is calling back to discuss clearance that was put in and results. Requesting call back asap.

## 2022-10-08 NOTE — Telephone Encounter (Addendum)
Per pre op APP Carlos Levering, NP states that Robin Searing, NP said to cancel his in office appt with the pt 10/10/22 and schedule a tele appt.

## 2022-10-08 NOTE — Telephone Encounter (Signed)
I will forward message to pre op APP and to Robin Searing, NP who pt has appt 10/10/22 for further advice. Pt is requesting a call back to discuss the CT result and pre op clearance. I will confirm with APP Robin Searing he will review results and pre op clearance at the time of the upcoming appt 10/10/22.

## 2022-10-09 ENCOUNTER — Ambulatory Visit: Payer: Medicare PPO | Attending: Cardiology | Admitting: Student

## 2022-10-09 DIAGNOSIS — Z01810 Encounter for preprocedural cardiovascular examination: Secondary | ICD-10-CM

## 2022-10-09 NOTE — Progress Notes (Signed)
Virtual Visit via Telephone Note   Because of Angela [name] co-morbid illnesses, she is at least at moderate risk for complications without adequate follow up.  This format is felt to be most appropriate for this patient at this time.  The patient did not have access to video technology/had technical difficulties with video requiring transitioning to audio format only (telephone).  All issues noted in this document were discussed and addressed.  No physical exam could be performed with this format.  Please refer to the patient's chart for her consent to telehealth for [facility].  Evaluation Performed:  Preoperative cardiovascular risk assessment _____________   Date:  10/09/2022   Patient ID:  Angela [name], DOB Sep 08, 1970, MRN [id number] Patient Location:  Home Provider location:   Office  Primary Care Provider:  Dorcas Carrow, DO Primary Cardiologist:  None  Chief Complaint / Patient Profile   52 y.o. y/o female with a h/o complete heart block s/p PPM, PAF, POTS, A. tach, mitral valve disease, hypertension, dysautonomia, Raynaud's who is pending cervical fusion by Dr. Jake Samples and presents today for telephonic preoperative cardiovascular risk assessment.  History of Present Illness    Angela [name] is a 52 y.o. female who presents via audio/video conferencing for a telehealth visit today.  Pt was last seen in cardiology clinic on 02/28/2022 by Dr. Graciela Husbands.  At that time Angela [name] complained of recurrent chest pain since discontinuing beta-blocker.  Calcium score was ordered and patient was started on diltiazem.  Patient had a virtual visit with Angela Searing, NP in preparation for cervical fusion.  At that time she reported chest discomfort with emotional stress but not exertional discomfort.  She had not completed calcium scoring as per her last visit with Dr. Graciela Husbands and it was decided for her to undergo that prior to completing cardiovascular risk  assessment.  Calcium scoring showed a calcium score of 0 with no evidence of coronary calcifications.  The patient is now pending procedure as outlined above.  Today, she is doing well from a cardiac standpoint. She endorses an increased amount of anxiety related to this upcoming surgery, as there is a problem with the screws from previous cervical spine repair a year ago and this surgery to fix things is more extensive than the last. She does have emotional chest pain but no chest pain, pressure or tightness with exertion. Patient denies shortness of breath or dyspnea on exertion. Denies lower extremity edema, orthopnea, or PND. No palpitations. Her activity is limited secondary to neck pain but she is managing to walk shorter distances. She is independent with ADLs and can do some light household activities.    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 EF 50-55% normal coronaries   Complete heart block (HCC)    Depression    Dysautonomia (HCC)    Dyspnea    CT negative for PE, 2007   Hypertension    Insomnia    OCD (obsessive compulsive disorder)    SVT (supraventricular tachycardia)    s/p ablation a. c/b AV nod ablation requiring pacemaker   Past Surgical History:  Procedure Laterality Date   ANTERIOR CERVICAL DECOMP/DISCECTOMY FUSION N/A 09/20/2021   Procedure: Anterior Cervical Discectomy Fusion  Cervical Four-Five, Cervical Five-Six, Cervical Six-Seven;  Surgeon: Monia Pouch  C, DO;  Location: [facility] OR;  Service: Neurosurgery;  Laterality: N/A;  3C   BREAST BIOPSY Right 1998   BREAST BIOPSY Right 2004   BREAST LUMPECTOMY     RIGHT -BREAST    COLONOSCOPY WITH PROPOFOL N/A 09/07/2019   Procedure: COLONOSCOPY WITH PROPOFOL;  Surgeon: Midge Minium, MD;  Location: [facility] ENDOSCOPY;  Service: Endoscopy;  Laterality: N/A;   ENDOMETRIAL ABLATION  2005    INSERT / REPLACE / REMOVE PACEMAKER     PELVIC LAPAROSCOPY  1990   PERMANENT PACEMAKER GENERATOR CHANGE N/A 07/13/2014   MDT MRI compatible dual chamber pacemaker implanted by Dr Graciela Husbands   [facility]  2005   TUBAL LIGATION  2003    Allergies  Allergies  Allergen Reactions   Toprol Xl [Metoprolol Succinate] Photosensitivity    Home Medications    Prior to Admission medications   Medication Sig Start Date End Date Taking? Authorizing Provider  aspirin EC 81 MG tablet Take 81 mg by mouth daily. Swallow whole.    [provider]  celecoxib (CELEBREX) 100 MG capsule Take 100 mg by mouth 2 (two) times daily. 12/20/21   [provider]  cyclobenzaprine (FLEXERIL) 5 MG tablet Take 5 mg by mouth 3 (three) times daily as needed for muscle spasms. 05/01/20   [provider]  diltiazem (CARDIZEM CD) 120 MG 24 hr capsule Take 1 capsule (120 mg total) by mouth daily. 02/28/22   Duke Salvia, MD  FIBER ADULT GUMMIES PO Take 2 each by mouth daily. [employer]well    [provider]  hydroxychloroquine (PLAQUENIL) 200 MG tablet Take 400 mg by mouth daily. 12/20/21   [provider]  LORazepam (ATIVAN) 0.5 MG tablet TAKE 1 TO 2 TABLETS(0.5 TO 1 MG) BY MOUTH DAILY AS NEEDED FOR ANXIETY 08/08/22   Johnson, Megan P, DO  methocarbamol (ROBAXIN) 500 MG tablet Take 500 mg by mouth 3 (three) times daily as needed. 12/20/21   [provider]  Multiple Vitamins-Minerals (MULTIVITAMIN WITH MINERALS) tablet Take 1 tablet by mouth daily.    [provider]  traMADol (ULTRAM) 50 MG tablet Take 50 mg by mouth every 6 (six) hours as needed. 12/08/21   [provider]  venlafaxine XR (EFFEXOR XR) 75 MG 24 hr capsule Take 1 tab daily with her 150mg  for a total of 225mg  daily 08/08/22   Olevia Perches P, DO  venlafaxine XR (EFFEXOR-XR) 150 MG 24 hr capsule TAKE 1 CAPSULE(150 MG) BY MOUTH DAILY WITH BREAKFAST 08/08/22   Dorcas Carrow, DO    Physical Exam     Vital Signs:  Angela [name] does not have vital signs available for review today.  Given telephonic nature of communication, physical exam is limited. AAOx3. NAD. Normal affect.  Speech and respirations are unlabored.  Accessory Clinical Findings    None  Assessment & Plan    Primary Cardiologist: None  Preoperative cardiovascular risk assessment.  Cervical fusion by Dr. Jake Samples.  Chart reviewed as part of pre-operative protocol coverage. According to the RCRI, patient has a 0.4% risk of MACE. Patient reports activity equivalent to 5.29 METS (per [facility]).   Given past medical history and time since last visit, based on ACC/AHA guidelines, TRACEE WIDHALM would be at acceptable risk for the planned procedure without further cardiovascular testing.   Patient was advised that if she develops new symptoms prior to surgery to contact our office to arrange a follow-up appointment.  she verbalized understanding.  Ideally aspirin should  be continued without interruption, however if the bleeding risk is too great, aspirin may be held for 5-7 days prior to surgery. Please resume aspirin post operatively when it is felt to be safe from a bleeding standpoint.    I will route this recommendation to the requesting party via Epic fax function.  Please call with questions.  Time:   Today, I have spent 10 minutes with the patient with telehealth technology discussing medical history, symptoms, and management plan.     Carlos Levering, NP  10/09/2022, 8:58 AM

## 2022-10-10 ENCOUNTER — Ambulatory Visit: Payer: Medicare PPO | Admitting: Nurse Practitioner

## 2022-10-14 ENCOUNTER — Other Ambulatory Visit: Payer: Self-pay | Admitting: Neurological Surgery

## 2022-10-25 NOTE — Progress Notes (Signed)
[facility] messaged requesting device orders. Awaiting response back. [employer] Rep emailed to notify of surgery/surgery date.

## 2022-10-25 NOTE — Pre-Procedure Instructions (Signed)
Surgical Instructions    Your procedure is scheduled on November 05, 2022.  Report to [facility] Main Entrance "A" at 5:30 A.M., then check in with the Admitting office.  Call this number if you have problems the morning of surgery:  [phone]  If you have any questions prior to your surgery date call [phone]: Open Monday-Friday 8am-4pm If you experience any cold or flu symptoms such as cough, fever, chills, shortness of breath, etc. between now and your scheduled surgery, please notify us at the above number.     Remember:  Do not eat or drink after midnight the night before your surgery     Take these medicines the morning of surgery with A SIP OF WATER:  diltiazem (CARDIZEM CD)   hydroxychloroquine (PLAQUENIL)   venlafaxine XR (EFFEXOR-XR)   LORazepam (ATIVAN) - may take if needed  traMADol Janean Sark) - may take if needed   Follow your surgeon's instructions on when to stop Aspirin.  If no instructions were given by your surgeon then you will need to call the office to get those instructions.     As of today, STOP taking any Aleve, Naproxen, Ibuprofen, Motrin, Advil, Goody's, BC's, all herbal medications, fish oil, and all vitamins. This includes your medication: celecoxib (CELEBREX)                      Do NOT Smoke (Tobacco/Vaping) for 24 hours prior to your procedure.  If you use a CPAP at night, you may bring your mask/headgear for your overnight stay.   Contacts, glasses, piercing's, hearing aid's, dentures or partials may not be worn into surgery, please bring cases for these belongings.    For patients admitted to the hospital, discharge time will be determined by your treatment team.   Patients discharged the day of surgery will not be allowed to drive home, and someone needs to stay with them for 24 hours.  SURGICAL WAITING ROOM VISITATION Patients having surgery or a procedure may have no more than 2 support people in the waiting area - these visitors may  rotate.   Children under the age of 88 must have an adult with them who is not the patient. If the patient needs to stay at the hospital during part of their recovery, the visitor guidelines for inpatient rooms apply. Pre-op nurse will coordinate an appropriate time for 1 support person to accompany patient in pre-op.  This support person may not rotate.   Please refer to the [facility] website for the visitor guidelines for Inpatients (after your surgery is over and you are in a regular room).    If you received a COVID test during your pre-op visit  it is requested that you wear a mask when out in public, stay away from anyone that may not be feeling well and notify your surgeon if you develop symptoms. If you have been in contact with anyone that has tested positive in the last 10 days please notify you surgeon.    Pre-operative 5 CHG Bath Instructions   You can play a key role in reducing the risk of infection after surgery. Your skin needs to be as free of germs as possible. You can reduce the number of germs on your skin by washing with CHG (chlorhexidine gluconate) soap before surgery. CHG is an antiseptic soap that kills germs and continues to kill germs even after washing.   DO NOT use if you have an allergy to chlorhexidine/CHG or antibacterial soaps.  If your skin becomes reddened or irritated, stop using the CHG and notify one of our RNs at [phone].   Please shower with the CHG soap starting 4 days before surgery using the following schedule:     Please keep in mind the following:  DO NOT shave, including legs and underarms, starting the day of your first shower.   You may shave your face at any point before/day of surgery.  Place clean sheets on your bed the day you start using CHG soap. Use a clean washcloth (not used since being washed) for each shower. DO NOT sleep with pets once you start using the CHG.   CHG Shower Instructions:  If you choose to wash your hair and  private area, wash first with your normal shampoo/soap.  After you use shampoo/soap, rinse your hair and body thoroughly to remove shampoo/soap residue.  Turn the water OFF and apply about 3 tablespoons (45 ml) of CHG soap to a CLEAN washcloth.  Apply CHG soap ONLY FROM YOUR NECK DOWN TO YOUR TOES (washing for 3-5 minutes)  DO NOT use CHG soap on face, private areas, open wounds, or sores.  Pay special attention to the area where your surgery is being performed.  If you are having back surgery, having someone wash your back for you may be helpful. Wait 2 minutes after CHG soap is applied, then you may rinse off the CHG soap.  Pat dry with a clean towel  Put on clean clothes/pajamas   If you choose to wear lotion, please use ONLY the CHG-compatible lotions on the back of this paper.     Additional instructions for the day of surgery: DO NOT APPLY any lotions, deodorants, cologne, or perfumes.   Do not wear jewelry or makeup Do not wear nail polish, gel polish, artificial nails, or any other type of covering on natural nails (fingers and toes) Do not bring valuables to the hospital. [facility] is not responsible for any belongings or valuables. Put on clean/comfortable clothes.  Brush your teeth.  Ask your nurse before applying any prescription medications to the skin.      CHG Compatible Lotions   Aveeno Moisturizing lotion  Cetaphil Moisturizing Cream  Cetaphil Moisturizing Lotion  Clairol Herbal Essence Moisturizing Lotion, Dry Skin  Clairol Herbal Essence Moisturizing Lotion, Extra Dry Skin  Clairol Herbal Essence Moisturizing Lotion, Normal Skin  Curel Age Defying Therapeutic Moisturizing Lotion with Alpha Hydroxy  Curel Extreme Care Body Lotion  Curel Soothing Hands Moisturizing Hand Lotion  Curel Therapeutic Moisturizing Cream, Fragrance-Free  Curel Therapeutic Moisturizing Lotion, Fragrance-Free  Curel Therapeutic Moisturizing Lotion, Original Formula  Eucerin Daily  Replenishing Lotion  Eucerin Dry Skin Therapy Plus Alpha Hydroxy Crme  Eucerin Dry Skin Therapy Plus Alpha Hydroxy Lotion  Eucerin Original Crme  Eucerin Original Lotion  Eucerin Plus Crme Eucerin Plus Lotion  Eucerin TriLipid Replenishing Lotion  Keri Anti-Bacterial Hand Lotion  Keri Deep Conditioning Original Lotion Dry Skin Formula Softly Scented  Keri Deep Conditioning Original Lotion, Fragrance Free Sensitive Skin Formula  Keri Lotion Fast Absorbing Fragrance Free Sensitive Skin Formula  Keri Lotion Fast Absorbing Softly Scented Dry Skin Formula  Keri Original Lotion  Keri Skin Renewal Lotion Keri Silky Smooth Lotion  Keri Silky Smooth Sensitive Skin Lotion  Nivea Body Creamy Conditioning Oil  Nivea Body Extra Enriched Teacher, adult education Moisturizing Lotion Nivea Crme  Nivea Skin Firming Lotion  NutraDerm 30 Skin Lotion  NutraDerm Skin Lotion  NutraDerm Therapeutic Skin Cream  NutraDerm Therapeutic Skin Lotion  ProShield Protective Hand Cream  Provon moisturizing lotion  Please read over the following fact sheets that you were given.

## 2022-10-29 ENCOUNTER — Other Ambulatory Visit: Payer: Self-pay

## 2022-10-29 ENCOUNTER — Encounter (HOSPITAL_COMMUNITY)
Admission: RE | Admit: 2022-10-29 | Discharge: 2022-10-29 | Disposition: A | Discharge status: Home / Self Care | Payer: Medicare PPO | Source: Ambulatory Visit | Attending: Neurological Surgery | Admitting: Neurological Surgery

## 2022-10-29 ENCOUNTER — Encounter: Payer: Self-pay | Admitting: Internal Medicine

## 2022-10-29 ENCOUNTER — Other Ambulatory Visit: Payer: Self-pay | Admitting: Neurological Surgery

## 2022-10-29 ENCOUNTER — Encounter (HOSPITAL_COMMUNITY): Payer: Self-pay

## 2022-10-29 VITALS — BP 162/104 | HR 88 | Temp 97.8°F | Resp 17 | Ht 64.0 in | Wt 169.0 lb

## 2022-10-29 DIAGNOSIS — M960 Pseudarthrosis after fusion or arthrodesis: Secondary | ICD-10-CM | POA: Insufficient documentation

## 2022-10-29 DIAGNOSIS — M4802 Spinal stenosis, cervical region: Secondary | ICD-10-CM | POA: Insufficient documentation

## 2022-10-29 DIAGNOSIS — Z950 Presence of cardiac pacemaker: Secondary | ICD-10-CM | POA: Insufficient documentation

## 2022-10-29 DIAGNOSIS — I10 Essential (primary) hypertension: Secondary | ICD-10-CM | POA: Diagnosis not present

## 2022-10-29 DIAGNOSIS — Z01818 Encounter for other preprocedural examination: Secondary | ICD-10-CM

## 2022-10-29 DIAGNOSIS — Z01812 Encounter for preprocedural laboratory examination: Secondary | ICD-10-CM | POA: Insufficient documentation

## 2022-10-29 LAB — CBC
HCT: 43.8 % (ref 36.0–46.0)
Hemoglobin: 15 g/dL (ref 12.0–15.0)
MCH: 32.2 pg (ref 26.0–34.0)
MCHC: 34.2 g/dL (ref 30.0–36.0)
MCV: 94 fL (ref 80.0–100.0)
Platelets: 255 10*3/uL (ref 150–400)
RBC: 4.66 MIL/uL (ref 3.87–5.11)
RDW: 11.8 % (ref 11.5–15.5)
WBC: 5.4 10*3/uL (ref 4.0–10.5)
nRBC: 0 % (ref 0.0–0.2)

## 2022-10-29 LAB — BASIC METABOLIC PANEL
Anion gap: 5 (ref 5–15)
BUN: 11 mg/dL (ref 6–20)
CO2: 27 mmol/L (ref 22–32)
Calcium: 9.3 mg/dL (ref 8.9–10.3)
Chloride: 104 mmol/L (ref 98–111)
Creatinine, Ser: 0.82 mg/dL (ref 0.44–1.00)
GFR, Estimated: >60 mL/min (ref >60)
Glucose, Bld: 90 mg/dL (ref 70–99)
Potassium: 4.1 mmol/L (ref 3.5–5.1)
Sodium: 136 mmol/L (ref 135–145)

## 2022-10-29 LAB — SURGICAL PCR SCREEN
MRSA, PCR: NEGATIVE
Staphylococcus aureus: POSITIVE — AB

## 2022-10-29 LAB — TYPE AND SCREEN
ABO/RH(D): A POS
Antibody Screen: NEGATIVE

## 2022-10-29 NOTE — Progress Notes (Signed)
PERIOPERATIVE PRESCRIPTION FOR IMPLANTED CARDIAC DEVICE PROGRAMMING  Patient Information: Name:  Angela [name]  DOB:  Mar 31, 1971  MRN:  [id number]    Planned Procedure:  Posterior Cervical Instrumentation and Fusion C6-C7, C7-T1  Surgeon:  Dr. Kendell Bane Dawley  Date of Procedure:  11/05/2022  Cautery will be used.  Position during surgery:  Prone   Please send documentation back to:  [facility] (Fax # [phone])   Device Information:  Clinic EP Physician:  Sherryl Manges, MD   Device Type:  Pacemaker Manufacturer and Phone #:  Medtronic: [phone] Pacemaker Dependent?:  Yes.   Date of Last Device Check:  08/09/2022 Normal Device Function?:  Yes.    Electrophysiologist's Recommendations:  Have magnet available. Provide continuous ECG monitoring when magnet is used or reprogramming is to be performed.  Procedure will likely interfere with device function.  Device should be programmed:  Asynchronous pacing during procedure and returned to normal programming after procedure  Per Device Clinic Standing Orders, Lenor Coffin, RN  7:33 AM 10/29/2022

## 2022-10-29 NOTE — Progress Notes (Addendum)
PCP - Olevia Perches, P DO Cardiologist - Dr Graciela Husbands  PPM/ICD - Pacer maker Medtronic Device Orders - Requested on 24th on May Rep Notified - Yes on the 24th of May   Chest x-ray - 01/29/22 EKG - 02/28/22 Stress Test - Yes "THat's been a while" "At least 6 years" ECHO - 03/03/20 Cardiac Cath - Denies  Sleep Study - Denies  DM - Denies  Blood Thinner Instructions:N/A Aspirin Instructions: Instructed to stop aspirin 3 weeks ago  ERAS Protcol -No  COVID TEST- N/A   Anesthesia review: Yes cardiac history  Patient denies shortness of breath, fever, cough and chest pain at PAT appointment   All instructions explained to the patient, with a verbal understanding of the material. Patient agrees to go over the instructions while at home for a better understanding.  The opportunity to ask questions was provided.  Spoke to [place], [place] regarding patient's B/P and he recommended to monitor B/P at home to be sure it wasn't staying elevated.  Patient stated she had a B/P monitor and would check it.

## 2022-10-30 ENCOUNTER — Encounter (HOSPITAL_COMMUNITY): Payer: Self-pay

## 2022-10-30 NOTE — Progress Notes (Signed)
Case: [id number] Date/Time: 11/05/22 0715   Procedure: POSTERIOR CERVICAL INSTRUMENTATION AND FUSION C6-T1 - 3C   Anesthesia type: General   Pre-op diagnosis: PSEUDOARTHROSIS FOLLOWING SPINAL FUSION   [facility] RO[facility] / [facility]   Surgeons: Dawley, Alan Mulder, DO       DISCUSSION: Angela [name] is a 52 year old female who presents to PAT prior to surgery above.  Patient underwent ACDF from C4-C7 in April 2023. Now undergoing revision due to broken screws.  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   No prior anesthesia complications  Patient follows with Cardiology. Last saw Dr. Graciela Husbands on 02/28/22 and had a telephone visit with Cardiology NP on 5/8. She endorsed having chest pain relating to anxiety due to upcoming extensive neck surgery but no exertional CP or SOB. She was having chest pain during her visit with Dr. Graciela Husbands as well so he ordered a CT coronary which came back reassuring (calcium score of 0). Preop risk assessment and clearance was provided:  "Preoperative cardiovascular risk assessment.  Cervical fusion by Dr. Jake Samples.   Chart reviewed as part of pre-operative protocol coverage. According to the RCRI, patient has a 0.4% risk of MACE. Patient reports activity equivalent to 5.29 METS (per DASI).    Given past medical history and time since last visit, based on ACC/AHA guidelines, Angela [name] would be at acceptable risk for the planned procedure without further cardiovascular testing.    Patient was advised that if she develops new symptoms prior to surgery to contact our office to arrange a follow-up appointment.  she verbalized understanding.   Ideally aspirin should be continued without interruption, however if the bleeding risk is too great, aspirin may be held for 5-7 days prior to surgery. Please resume aspirin post operatively when it is felt to be safe from a  bleeding standpoint. "     VS: BP (!) 162/104   Pulse 88   Temp 36.6 C   Resp 17   Ht 5\' 4"  (1.626 m)   Wt 76.7 kg   SpO2 100%   BMI 29.01 kg/m   PROVIDERS: Dorcas Carrow, DO   LABS: Labs reviewed: Acceptable for surgery. (all labs ordered are listed, but only abnormal results are displayed)  Labs Reviewed  SURGICAL PCR SCREEN - Abnormal; Notable for the following components:      Result Value   Staphylococcus aureus POSITIVE (*)    All other components within normal limits  BASIC METABOLIC PANEL  CBC  TYPE AND SCREEN     IMAGES:  CT coronary 10/04/22:  IMPRESSION AND RECOMMENDATION: 1. Normal coronary calcium score of 0.   2. CAC 0, CAC-DRS A0.   3. Continue heart healthy lifestyle and risk factor modification.  CT C-spine 09/10/22:  IMPRESSION: 1. C4-C7 ACDF. The C7 screws are fractured with proud heads by 2 mm. 2. Solid arthrodesis is definite at C4-5, C5-6 and indeterminate at C6-7. 3. C3-4 moderate left foraminal stenosis from degenerative spurring. The C2-3 level is ankylosed.   EKG 02/28/22:  sinus P-synchronous/ AV  pacing     CV:  Echo 03/03/20  IMPRESSIONS     1. Left ventricular ejection fraction, by estimation, is 55 to 60%. The  left ventricle has normal function. The left ventricle has no regional  wall motion abnormalities. Left ventricular diastolic parameters are  indeterminate.   2. Right ventricular systolic function is  normal. The right ventricular  size is normal. There is normal pulmonary artery systolic pressure.   3. Right atrial size was mildly dilated.   4. The mitral valve is normal in structure. Trivial mitral valve  regurgitation.   5. The tricuspid valve is abnormal. Tricuspid valve regurgitation is  moderate.   6. The aortic valve is tricuspid. Aortic valve regurgitation is not  visualized. No aortic stenosis is present.   7. The inferior vena cava is normal in size with greater than 50%  respiratory  variability, suggesting right atrial pressure of 3 mmHg.   Comparison(s): No significant change from prior study.   Cath 2010:  Per Dr. Gala Romney: "I recently performed heart catheterization on her for chest pain. 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 EF 50-55% normal coronaries   Complete heart block (HCC)    Depression    Dysautonomia (HCC)    Dyspnea    CT negative for PE, 2007   Hypertension    Insomnia    OCD (obsessive compulsive disorder)    SVT (supraventricular tachycardia)    s/p ablation a. c/b AV nod ablation requiring pacemaker    Past Surgical History:  Procedure Laterality Date   ANTERIOR CERVICAL DECOMP/DISCECTOMY FUSION N/A 09/20/2021   Procedure: Anterior Cervical Discectomy Fusion  Cervical Four-Five, Cervical Five-Six, Cervical Six-Seven;  Surgeon: Dawley, Alan Mulder, DO;  Location: [facility] OR;  Service: Neurosurgery;  Laterality: N/A;  3C   BREAST BIOPSY Right 1998   BREAST BIOPSY Right 2004   BREAST LUMPECTOMY     RIGHT -BREAST    COLONOSCOPY WITH PROPOFOL N/A 09/07/2019   Procedure: COLONOSCOPY WITH PROPOFOL;  Surgeon: Midge Minium, MD;  Location: [facility] ENDOSCOPY;  Service: Endoscopy;  Laterality: N/A;   ENDOMETRIAL ABLATION  2005   INSERT / REPLACE / REMOVE PACEMAKER  2007   PELVIC LAPAROSCOPY  1990   PERMANENT PACEMAKER GENERATOR CHANGE N/A 07/13/2014   MDT MRI compatible dual chamber pacemaker implanted by Dr Farrel Gordon  2005   TUBAL LIGATION  2003    MEDICATIONS:  aspirin EC 81 MG tablet   celecoxib (CELEBREX) 100 MG capsule   diltiazem (CARDIZEM CD) 120 MG 24 hr capsule   FIBER ADULT GUMMIES PO   hydroxychloroquine (PLAQUENIL) 200 MG tablet   LORazepam (ATIVAN) 0.5 MG tablet   methocarbamol (ROBAXIN) 500 MG tablet   Multiple Vitamins-Minerals (MULTIVITAMIN WITH  MINERALS) tablet   traMADol (ULTRAM) 50 MG tablet   venlafaxine XR (EFFEXOR XR) 75 MG 24 hr capsule   venlafaxine XR (EFFEXOR-XR) 150 MG 24 hr capsule   No current facility-administered medications for this encounter.   Marcille Blanco [facility] Surgical Short Stay/Anesthesiology [facility] Phone [phone] 10/30/2022 3:57 PM

## 2022-10-30 NOTE — Anesthesia Preprocedure Evaluation (Addendum)
Anesthesia Evaluation  Patient identified by MRN, date of birth, ID band Patient awake    Reviewed: Allergy & Precautions, NPO status , Patient's Chart, lab work & pertinent test results  History of Anesthesia Complications Negative for: history of anesthetic complications  Airway Mallampati: II  TM Distance: >3 FB Neck ROM: Limited    Dental  (+) Dental Advisory Given, Teeth Intact   Pulmonary    Pulmonary exam normal        Cardiovascular hypertension, Normal cardiovascular exam+ dysrhythmias Supra Ventricular Tachycardia + pacemaker    Hx CHB Dysautonomia    Neuro/Psych  PSYCHIATRIC DISORDERS  Depression     OCD negative neurological ROS     GI/Hepatic negative GI ROS, Neg liver ROS,,,  Endo/Other  negative endocrine ROS    Renal/GU negative Renal ROS     Musculoskeletal  (+) Arthritis ,    Abdominal   Peds  Hematology negative hematology ROS (+)   Anesthesia Other Findings   Reproductive/Obstetrics                             Anesthesia Physical Anesthesia Plan  ASA: 3  Anesthesia Plan: General   Post-op Pain Management: Tylenol PO (pre-op)* and Celebrex PO (pre-op)*   Induction: Intravenous  PONV Risk Score and Plan: 3 and Treatment may vary due to age or medical condition, Ondansetron, Dexamethasone and Midazolam  Airway Management Planned: Oral ETT and Video Laryngoscope Planned  Additional Equipment: None  Intra-op Plan:   Post-operative Plan: Extubation in OR  Informed Consent: I have reviewed the patients History and Physical, chart, labs and discussed the procedure including the risks, benefits and alternatives for the proposed anesthesia with the patient or authorized representative who has indicated his/her understanding and acceptance.     Dental advisory given  Plan Discussed with: CRNA and Anesthesiologist  Anesthesia Plan Comments: (See PAT  note Given hx of CHB, we will require that PPM be adjusted in preop by rep prior to surgery to avoid interference during surgery)        Anesthesia Quick Evaluation

## 2022-11-04 ENCOUNTER — Encounter: Payer: Self-pay | Admitting: Internal Medicine

## 2022-11-04 ENCOUNTER — Encounter: Payer: Self-pay | Admitting: Family Medicine

## 2022-11-05 ENCOUNTER — Other Ambulatory Visit: Payer: Self-pay

## 2022-11-05 ENCOUNTER — Inpatient Hospital Stay (HOSPITAL_COMMUNITY)
Admission: RE | Admit: 2022-11-05 | Discharge: 2022-11-06 | DRG: 472 | Disposition: A | Discharge status: Home / Self Care | Payer: Medicare PPO | Attending: Neurological Surgery | Admitting: Neurological Surgery

## 2022-11-05 ENCOUNTER — Inpatient Hospital Stay (HOSPITAL_COMMUNITY): Payer: Medicare PPO | Admitting: Certified Registered Nurse Anesthetist

## 2022-11-05 ENCOUNTER — Encounter (HOSPITAL_COMMUNITY): Payer: Self-pay | Admitting: Neurological Surgery

## 2022-11-05 ENCOUNTER — Encounter (HOSPITAL_COMMUNITY)
Admission: RE | Disposition: A | Discharge status: Home / Self Care | Payer: Self-pay | Source: Home / Self Care | Attending: Neurological Surgery

## 2022-11-05 ENCOUNTER — Inpatient Hospital Stay (HOSPITAL_COMMUNITY): Payer: Medicare PPO | Admitting: Physician Assistant

## 2022-11-05 ENCOUNTER — Inpatient Hospital Stay (HOSPITAL_COMMUNITY): Payer: Medicare PPO

## 2022-11-05 DIAGNOSIS — Z888 Allergy status to other drugs, medicaments and biological substances status: Secondary | ICD-10-CM

## 2022-11-05 DIAGNOSIS — M1990 Unspecified osteoarthritis, unspecified site: Secondary | ICD-10-CM | POA: Diagnosis not present

## 2022-11-05 DIAGNOSIS — Z7982 Long term (current) use of aspirin: Secondary | ICD-10-CM

## 2022-11-05 DIAGNOSIS — Z8261 Family history of arthritis: Secondary | ICD-10-CM

## 2022-11-05 DIAGNOSIS — I10 Essential (primary) hypertension: Secondary | ICD-10-CM

## 2022-11-05 DIAGNOSIS — I442 Atrioventricular block, complete: Secondary | ICD-10-CM | POA: Diagnosis present

## 2022-11-05 DIAGNOSIS — Y838 Other surgical procedures as the cause of abnormal reaction of the patient, or of later complication, without mention of misadventure at the time of the procedure: Secondary | ICD-10-CM | POA: Diagnosis present

## 2022-11-05 DIAGNOSIS — Z79899 Other long term (current) drug therapy: Secondary | ICD-10-CM | POA: Diagnosis not present

## 2022-11-05 DIAGNOSIS — I471 Supraventricular tachycardia, unspecified: Secondary | ICD-10-CM | POA: Diagnosis not present

## 2022-11-05 DIAGNOSIS — M19042 Primary osteoarthritis, left hand: Secondary | ICD-10-CM | POA: Diagnosis present

## 2022-11-05 DIAGNOSIS — G901 Familial dysautonomia [Riley-Day]: Secondary | ICD-10-CM | POA: Diagnosis not present

## 2022-11-05 DIAGNOSIS — M19041 Primary osteoarthritis, right hand: Secondary | ICD-10-CM | POA: Diagnosis present

## 2022-11-05 DIAGNOSIS — M160 Bilateral primary osteoarthritis of hip: Secondary | ICD-10-CM | POA: Diagnosis present

## 2022-11-05 DIAGNOSIS — M4802 Spinal stenosis, cervical region: Secondary | ICD-10-CM | POA: Diagnosis not present

## 2022-11-05 DIAGNOSIS — T84296A Other mechanical complication of internal fixation device of vertebrae, initial encounter: Secondary | ICD-10-CM

## 2022-11-05 DIAGNOSIS — M170 Bilateral primary osteoarthritis of knee: Secondary | ICD-10-CM | POA: Diagnosis present

## 2022-11-05 DIAGNOSIS — G4700 Insomnia, unspecified: Secondary | ICD-10-CM | POA: Diagnosis present

## 2022-11-05 DIAGNOSIS — T84216A Breakdown (mechanical) of internal fixation device of vertebrae, initial encounter: Principal | ICD-10-CM | POA: Diagnosis present

## 2022-11-05 DIAGNOSIS — F429 Obsessive-compulsive disorder, unspecified: Secondary | ICD-10-CM | POA: Diagnosis not present

## 2022-11-05 DIAGNOSIS — F32A Depression, unspecified: Secondary | ICD-10-CM | POA: Diagnosis present

## 2022-11-05 DIAGNOSIS — Z981 Arthrodesis status: Secondary | ICD-10-CM | POA: Diagnosis not present

## 2022-11-05 DIAGNOSIS — M960 Pseudarthrosis after fusion or arthrodesis: Secondary | ICD-10-CM | POA: Diagnosis not present

## 2022-11-05 HISTORY — PX: POSTERIOR CERVICAL FUSION/FORAMINOTOMY: SHX5038

## 2022-11-05 LAB — POCT PREGNANCY, URINE: Preg Test, Ur: NEGATIVE

## 2022-11-05 SURGERY — POSTERIOR CERVICAL FUSION/FORAMINOTOMY LEVEL 2
Anesthesia: General | Site: Back

## 2022-11-05 MED ORDER — ROCURONIUM BROMIDE 10 MG/ML (PF) SYRINGE
PREFILLED_SYRINGE | INTRAVENOUS | Status: DC | PRN
  Administered 2022-11-05: 50 mg via INTRAVENOUS
  Administered 2022-11-05: 30 mg via INTRAVENOUS
  Administered 2022-11-05: 10 mg via INTRAVENOUS

## 2022-11-05 MED ORDER — KETAMINE HCL 10 MG/ML IJ SOLN
INTRAMUSCULAR | Status: DC | PRN
  Administered 2022-11-05: 25 mg via INTRAVENOUS

## 2022-11-05 MED ORDER — SODIUM CHLORIDE 0.9% FLUSH: 3.0000 mL | INTRAVENOUS | Status: DC | PRN

## 2022-11-05 MED ORDER — CHLORHEXIDINE GLUCONATE CLOTH 2 % EX PADS: 6.0000 | MEDICATED_PAD | Freq: Once | CUTANEOUS | Status: DC

## 2022-11-05 MED ORDER — THROMBIN 20000 UNITS EX SOLR
CUTANEOUS | Status: AC
  Filled 2022-11-05: qty 20000

## 2022-11-05 MED ORDER — OXYCODONE HCL 5 MG PO TABS: 5.0000 mg | ORAL_TABLET | ORAL | Status: DC | PRN

## 2022-11-05 MED ORDER — FLEET ENEMA 7-19 GM/118ML RE ENEM: 1.0000 | ENEMA | Freq: Once | RECTAL | Status: DC | PRN

## 2022-11-05 MED ORDER — LACTATED RINGERS IV SOLN: INTRAVENOUS | Status: DC | PRN

## 2022-11-05 MED ORDER — DOCUSATE SODIUM 100 MG PO CAPS
100.0000 mg | ORAL_CAPSULE | Freq: Two times a day (BID) | ORAL | Status: DC
  Administered 2022-11-05 – 2022-11-06 (×2): 100 mg via ORAL
  Filled 2022-11-05 (×2): qty 1

## 2022-11-05 MED ORDER — CHLORHEXIDINE GLUCONATE 0.12 % MT SOLN
15.0000 mL | Freq: Once | OROMUCOSAL | Status: AC
  Administered 2022-11-05: 15 mL via OROMUCOSAL
  Filled 2022-11-05: qty 15

## 2022-11-05 MED ORDER — LORAZEPAM 0.5 MG PO TABS: 0.5000 mg | ORAL_TABLET | Freq: Every day | ORAL | Status: DC | PRN

## 2022-11-05 MED ORDER — OXYCODONE HCL 5 MG PO TABS: 5.0000 mg | ORAL_TABLET | Freq: Once | ORAL | Status: AC | PRN

## 2022-11-05 MED ORDER — CELECOXIB 200 MG PO CAPS
200.0000 mg | ORAL_CAPSULE | Freq: Once | ORAL | Status: AC
  Administered 2022-11-05: 200 mg via ORAL
  Filled 2022-11-05: qty 1

## 2022-11-05 MED ORDER — ONDANSETRON HCL 4 MG PO TABS: 4.0000 mg | ORAL_TABLET | Freq: Four times a day (QID) | ORAL | Status: DC | PRN

## 2022-11-05 MED ORDER — METHOCARBAMOL 1000 MG/10ML IJ SOLN: 500.0000 mg | Freq: Four times a day (QID) | INTRAVENOUS | Status: DC | PRN

## 2022-11-05 MED ORDER — METHOCARBAMOL 500 MG PO TABS
500.0000 mg | ORAL_TABLET | Freq: Four times a day (QID) | ORAL | Status: DC | PRN
  Administered 2022-11-05 – 2022-11-06 (×3): 500 mg via ORAL
  Filled 2022-11-05 (×3): qty 1

## 2022-11-05 MED ORDER — BACITRACIN 500 UNIT/GM EX OINT
TOPICAL_OINTMENT | CUTANEOUS | Status: DC | PRN
  Administered 2022-11-05: 1

## 2022-11-05 MED ORDER — OXYCODONE HCL 5 MG/5ML PO SOLN
ORAL | Status: AC
  Filled 2022-11-05: qty 5

## 2022-11-05 MED ORDER — LIDOCAINE 2% (20 MG/ML) 5 ML SYRINGE
INTRAMUSCULAR | Status: DC | PRN
  Administered 2022-11-05: 60 mg via INTRAVENOUS

## 2022-11-05 MED ORDER — VENLAFAXINE HCL ER 75 MG PO CP24: 75.0000 mg | ORAL_CAPSULE | Freq: Every day | ORAL | Status: DC

## 2022-11-05 MED ORDER — ASPIRIN 81 MG PO TBEC: 81.0000 mg | DELAYED_RELEASE_TABLET | Freq: Every day | ORAL | Status: DC

## 2022-11-05 MED ORDER — PROPOFOL 10 MG/ML IV BOLUS
INTRAVENOUS | Status: AC
  Filled 2022-11-05: qty 20

## 2022-11-05 MED ORDER — HYDROMORPHONE HCL 1 MG/ML IJ SOLN: 0.5000 mg | INTRAMUSCULAR | Status: DC | PRN

## 2022-11-05 MED ORDER — SODIUM CHLORIDE 0.9 % IV SOLN: 250.0000 mL | INTRAVENOUS | Status: DC

## 2022-11-05 MED ORDER — ORAL CARE MOUTH RINSE: 15.0000 mL | Freq: Once | OROMUCOSAL | Status: AC

## 2022-11-05 MED ORDER — OXYCODONE HCL 5 MG PO TABS
10.0000 mg | ORAL_TABLET | ORAL | Status: DC | PRN
  Administered 2022-11-05 – 2022-11-06 (×6): 10 mg via ORAL
  Filled 2022-11-05 (×6): qty 2

## 2022-11-05 MED ORDER — LIDOCAINE-EPINEPHRINE 1 %-1:100000 IJ SOLN
INTRAMUSCULAR | Status: DC | PRN
  Administered 2022-11-05: 5 mL

## 2022-11-05 MED ORDER — FENTANYL CITRATE (PF) 100 MCG/2ML IJ SOLN
INTRAMUSCULAR | Status: AC
  Filled 2022-11-05: qty 2

## 2022-11-05 MED ORDER — KETAMINE HCL 50 MG/5ML IJ SOSY
PREFILLED_SYRINGE | INTRAMUSCULAR | Status: AC
  Filled 2022-11-05: qty 5

## 2022-11-05 MED ORDER — LACTATED RINGERS IV SOLN: INTRAVENOUS | Status: DC

## 2022-11-05 MED ORDER — PROMETHAZINE HCL 25 MG/ML IJ SOLN: 6.2500 mg | INTRAMUSCULAR | Status: DC | PRN

## 2022-11-05 MED ORDER — THROMBIN 5000 UNITS EX SOLR
CUTANEOUS | Status: AC
  Filled 2022-11-05: qty 5000

## 2022-11-05 MED ORDER — BUPIVACAINE-EPINEPHRINE (PF) 0.5% -1:200000 IJ SOLN
INTRAMUSCULAR | Status: AC
  Filled 2022-11-05: qty 30

## 2022-11-05 MED ORDER — BUPIVACAINE LIPOSOME 1.3 % IJ SUSP
INTRAMUSCULAR | Status: DC | PRN
  Administered 2022-11-05: 20 mL

## 2022-11-05 MED ORDER — ONDANSETRON HCL 4 MG/2ML IJ SOLN
INTRAMUSCULAR | Status: DC | PRN
  Administered 2022-11-05: 4 mg via INTRAVENOUS

## 2022-11-05 MED ORDER — ACETAMINOPHEN 325 MG PO TABS
650.0000 mg | ORAL_TABLET | ORAL | Status: DC | PRN
  Administered 2022-11-05 – 2022-11-06 (×3): 650 mg via ORAL
  Filled 2022-11-05 (×3): qty 2

## 2022-11-05 MED ORDER — OXYCODONE HCL 5 MG/5ML PO SOLN
5.0000 mg | Freq: Once | ORAL | Status: AC | PRN
  Administered 2022-11-05: 5 mg via ORAL

## 2022-11-05 MED ORDER — DILTIAZEM HCL ER COATED BEADS 120 MG PO CP24
120.0000 mg | ORAL_CAPSULE | Freq: Every day | ORAL | Status: DC
  Administered 2022-11-05: 120 mg via ORAL
  Filled 2022-11-05 (×4): qty 1

## 2022-11-05 MED ORDER — HYDROXYCHLOROQUINE SULFATE 200 MG PO TABS: 400.0000 mg | ORAL_TABLET | Freq: Every day | ORAL | Status: DC

## 2022-11-05 MED ORDER — THROMBIN 5000 UNITS EX SOLR: OROMUCOSAL | Status: DC | PRN

## 2022-11-05 MED ORDER — KETOROLAC TROMETHAMINE 15 MG/ML IJ SOLN
15.0000 mg | Freq: Four times a day (QID) | INTRAMUSCULAR | Status: AC
  Administered 2022-11-05 – 2022-11-06 (×4): 15 mg via INTRAVENOUS
  Filled 2022-11-05 (×4): qty 1

## 2022-11-05 MED ORDER — CEFAZOLIN SODIUM-DEXTROSE 2-4 GM/100ML-% IV SOLN
2.0000 g | Freq: Three times a day (TID) | INTRAVENOUS | Status: AC
  Administered 2022-11-05 (×2): 2 g via INTRAVENOUS
  Filled 2022-11-05 (×2): qty 100

## 2022-11-05 MED ORDER — PROPOFOL 10 MG/ML IV BOLUS
INTRAVENOUS | Status: DC | PRN
  Administered 2022-11-05: 50 mg via INTRAVENOUS
  Administered 2022-11-05: 150 mg via INTRAVENOUS

## 2022-11-05 MED ORDER — MIDAZOLAM HCL 2 MG/2ML IJ SOLN
INTRAMUSCULAR | Status: DC | PRN
  Administered 2022-11-05: 2 mg via INTRAVENOUS

## 2022-11-05 MED ORDER — VENLAFAXINE HCL ER 75 MG PO CP24
150.0000 mg | ORAL_CAPSULE | Freq: Every day | ORAL | Status: DC
  Administered 2022-11-05: 150 mg via ORAL
  Filled 2022-11-05: qty 2

## 2022-11-05 MED ORDER — ACETAMINOPHEN 650 MG RE SUPP: 650.0000 mg | RECTAL | Status: DC | PRN

## 2022-11-05 MED ORDER — MENTHOL 3 MG MT LOZG: 1.0000 | LOZENGE | OROMUCOSAL | Status: DC | PRN

## 2022-11-05 MED ORDER — CEFAZOLIN SODIUM-DEXTROSE 2-4 GM/100ML-% IV SOLN
2.0000 g | INTRAVENOUS | Status: AC
  Administered 2022-11-05: 2 g via INTRAVENOUS
  Filled 2022-11-05: qty 100

## 2022-11-05 MED ORDER — VENLAFAXINE HCL ER 150 MG PO CP24: 150.0000 mg | ORAL_CAPSULE | Freq: Every day | ORAL | Status: DC

## 2022-11-05 MED ORDER — PHENYLEPHRINE 80 MCG/ML (10ML) SYRINGE FOR IV PUSH (FOR BLOOD PRESSURE SUPPORT)
PREFILLED_SYRINGE | INTRAVENOUS | Status: DC | PRN
  Administered 2022-11-05 (×2): 80 ug via INTRAVENOUS

## 2022-11-05 MED ORDER — BUPIVACAINE-EPINEPHRINE (PF) 0.5% -1:200000 IJ SOLN
INTRAMUSCULAR | Status: DC | PRN
  Administered 2022-11-05: 5 mL via PERINEURAL

## 2022-11-05 MED ORDER — BUPIVACAINE LIPOSOME 1.3 % IJ SUSP
INTRAMUSCULAR | Status: AC
  Filled 2022-11-05: qty 20

## 2022-11-05 MED ORDER — PHENYLEPHRINE HCL-NACL 20-0.9 MG/250ML-% IV SOLN
INTRAVENOUS | Status: DC | PRN
  Administered 2022-11-05: 30 ug/min via INTRAVENOUS

## 2022-11-05 MED ORDER — THROMBIN 20000 UNITS EX SOLR: CUTANEOUS | Status: DC | PRN

## 2022-11-05 MED ORDER — FENTANYL CITRATE (PF) 100 MCG/2ML IJ SOLN
25.0000 ug | INTRAMUSCULAR | Status: DC | PRN
  Administered 2022-11-05: 25 ug via INTRAVENOUS
  Administered 2022-11-05 (×2): 50 ug via INTRAVENOUS
  Administered 2022-11-05: 25 ug via INTRAVENOUS

## 2022-11-05 MED ORDER — SODIUM CHLORIDE 0.9% FLUSH
3.0000 mL | Freq: Two times a day (BID) | INTRAVENOUS | Status: DC
  Administered 2022-11-05 (×2): 3 mL via INTRAVENOUS

## 2022-11-05 MED ORDER — SODIUM CHLORIDE 0.9 % IV SOLN: INTRAVENOUS | Status: DC

## 2022-11-05 MED ORDER — FENTANYL CITRATE (PF) 250 MCG/5ML IJ SOLN
INTRAMUSCULAR | Status: DC | PRN
  Administered 2022-11-05 (×4): 50 ug via INTRAVENOUS

## 2022-11-05 MED ORDER — MIDAZOLAM HCL 2 MG/2ML IJ SOLN
INTRAMUSCULAR | Status: AC
  Filled 2022-11-05: qty 2

## 2022-11-05 MED ORDER — POLYETHYLENE GLYCOL 3350 17 G PO PACK: 17.0000 g | PACK | Freq: Every day | ORAL | Status: DC | PRN

## 2022-11-05 MED ORDER — ONDANSETRON HCL 4 MG/2ML IJ SOLN: 4.0000 mg | Freq: Four times a day (QID) | INTRAMUSCULAR | Status: DC | PRN

## 2022-11-05 MED ORDER — PHENOL 1.4 % MT LIQD: 1.0000 | OROMUCOSAL | Status: DC | PRN

## 2022-11-05 MED ORDER — 0.9 % SODIUM CHLORIDE (POUR BTL) OPTIME
TOPICAL | Status: DC | PRN
  Administered 2022-11-05: 1000 mL

## 2022-11-05 MED ORDER — BACITRACIN ZINC 500 UNIT/GM EX OINT
TOPICAL_OINTMENT | CUTANEOUS | Status: AC
  Filled 2022-11-05: qty 28.35

## 2022-11-05 MED ORDER — SUGAMMADEX SODIUM 200 MG/2ML IV SOLN
INTRAVENOUS | Status: DC | PRN
  Administered 2022-11-05: 148.8 mg via INTRAVENOUS

## 2022-11-05 MED ORDER — ACETAMINOPHEN 500 MG PO TABS
1000.0000 mg | ORAL_TABLET | Freq: Once | ORAL | Status: AC
  Administered 2022-11-05: 1000 mg via ORAL
  Filled 2022-11-05: qty 2

## 2022-11-05 MED ORDER — LIDOCAINE-EPINEPHRINE 1 %-1:100000 IJ SOLN
INTRAMUSCULAR | Status: AC
  Filled 2022-11-05: qty 1

## 2022-11-05 MED ORDER — DEXAMETHASONE SODIUM PHOSPHATE 10 MG/ML IJ SOLN
INTRAMUSCULAR | Status: DC | PRN
  Administered 2022-11-05: 5 mg via INTRAVENOUS

## 2022-11-05 MED ORDER — FENTANYL CITRATE (PF) 250 MCG/5ML IJ SOLN
INTRAMUSCULAR | Status: AC
  Filled 2022-11-05: qty 5

## 2022-11-05 SURGICAL SUPPLY — 76 items
ADH SKN CLS APL DERMABOND .7 (GAUZE/BANDAGES/DRESSINGS) ×1
BAG COUNTER SPONGE SURGICOUNT (BAG) ×1 IMPLANT
BAG SPNG CNTER NS LX DISP (BAG) ×2
BIT DRILL NEURO 2X3.1 SFT TUCH (MISCELLANEOUS) ×1 IMPLANT
BNDG GAUZE DERMACEA FLUFF 4 (GAUZE/BANDAGES/DRESSINGS) IMPLANT
BNDG GZE DERMACEA 4 6PLY (GAUZE/BANDAGES/DRESSINGS)
BUR MATCHSTICK NEURO 3.0 LAGG (BURR) IMPLANT
BUR SABER NEURO 2.5 (BURR) IMPLANT
CNTNR URN SCR LID CUP LEK RST (MISCELLANEOUS) ×1 IMPLANT
CONT SPEC 4OZ STRL OR WHT (MISCELLANEOUS) ×1
COVER BACK TABLE 60X90IN (DRAPES) ×1 IMPLANT
COVER MAYO STAND STRL (DRAPES) ×1 IMPLANT
DERMABOND ADVANCED .7 DNX12 (GAUZE/BANDAGES/DRESSINGS) ×1 IMPLANT
DRAIN JACKSON RD 7FR 3/32 (WOUND CARE) IMPLANT
DRAPE C-ARM 42X72 X-RAY (DRAPES) ×1 IMPLANT
DRAPE LAPAROTOMY 100X72X124 (DRAPES) ×1 IMPLANT
DRAPE MICROSCOPE SLANT 54X150 (MISCELLANEOUS) ×1 IMPLANT
DRAPE SURG 17X23 STRL (DRAPES) ×1 IMPLANT
DRILL NEURO 2X3.1 SOFT TOUCH (MISCELLANEOUS) ×1
DRSG OPSITE POSTOP 4X6 (GAUZE/BANDAGES/DRESSINGS) ×1 IMPLANT
DURAPREP 26ML APPLICATOR (WOUND CARE) ×1 IMPLANT
ELECT BLADE INSULATED 4IN (ELECTROSURGICAL) ×1
ELECT BLADE INSULATED 6.5IN (ELECTROSURGICAL)
ELECT COATED BLADE 2.86 ST (ELECTRODE) ×1 IMPLANT
ELECT REM PT RETURN 9FT ADLT (ELECTROSURGICAL) ×1
ELECTRODE BLADE INSULATED 4IN (ELECTROSURGICAL) IMPLANT
ELECTRODE BLDE INSULATED 6.5IN (ELECTROSURGICAL) IMPLANT
ELECTRODE REM PT RTRN 9FT ADLT (ELECTROSURGICAL) ×1 IMPLANT
GAUZE 4X4 16PLY ~~LOC~~+RFID DBL (SPONGE) IMPLANT
GAUZE SPONGE 4X4 12PLY STRL (GAUZE/BANDAGES/DRESSINGS) IMPLANT
GLOVE BIO SURGEON STRL SZ7 (GLOVE) ×1 IMPLANT
GLOVE BIOGEL PI IND STRL 7.5 (GLOVE) ×1 IMPLANT
GLOVE BIOGEL PI IND STRL 8 (GLOVE) ×1 IMPLANT
GLOVE ECLIPSE 8.0 STRL XLNG CF (GLOVE) ×1 IMPLANT
GOWN STRL REUS W/ TWL LRG LVL3 (GOWN DISPOSABLE) IMPLANT
GOWN STRL REUS W/ TWL XL LVL3 (GOWN DISPOSABLE) ×2 IMPLANT
GOWN STRL REUS W/TWL 2XL LVL3 (GOWN DISPOSABLE) IMPLANT
GOWN STRL REUS W/TWL LRG LVL3 (GOWN DISPOSABLE)
GOWN STRL REUS W/TWL XL LVL3 (GOWN DISPOSABLE) ×2
HEMOSTAT POWDER KIT SURGIFOAM (HEMOSTASIS) ×1 IMPLANT
KIT BASIN OR (CUSTOM PROCEDURE TRAY) ×1 IMPLANT
KIT TURNOVER KIT B (KITS) ×1 IMPLANT
MARKER SKIN DUAL TIP RULER LAB (MISCELLANEOUS) ×1 IMPLANT
NDL BLUNT 18X1 FOR OR ONLY (NEEDLE) IMPLANT
NDL HYPO 25X1 1.5 SAFETY (NEEDLE) ×1 IMPLANT
NDL SPNL 18GX3.5 QUINCKE PK (NEEDLE) ×2 IMPLANT
NEEDLE BLUNT 18X1 FOR OR ONLY (NEEDLE) IMPLANT
NEEDLE HYPO 25X1 1.5 SAFETY (NEEDLE) ×1 IMPLANT
NEEDLE SPNL 18GX3.5 QUINCKE PK (NEEDLE) ×2 IMPLANT
NS IRRIG 1000ML POUR BTL (IV SOLUTION) ×1 IMPLANT
PACK LAMINECTOMY NEURO (CUSTOM PROCEDURE TRAY) ×1 IMPLANT
PAD ARMBOARD 7.5X6 YLW CONV (MISCELLANEOUS) ×3 IMPLANT
PATTIES SURGICAL .5 X1 (DISPOSABLE) IMPLANT
PUTTY BONE 100 VESUVIUS 2.5CC (Putty) IMPLANT
ROD CONT YUKON 3.5X35 (Rod) IMPLANT
SCREW PA FIX YUKON 4.5X28 (Screw) IMPLANT
SCREW PA YUKON 3.5X12 (Screw) IMPLANT
SCREW SET SPINAL YUKON (Set) IMPLANT
SOL ELECTROSURG ANTI STICK (MISCELLANEOUS)
SOLUTION ELECTROSURG ANTI STCK (MISCELLANEOUS) ×1 IMPLANT
SPIKE FLUID TRANSFER (MISCELLANEOUS) ×1 IMPLANT
SPONGE SURGIFOAM ABS GEL 100 (HEMOSTASIS) ×1 IMPLANT
SPONGE T-LAP 4X18 ~~LOC~~+RFID (SPONGE) IMPLANT
STAPLER VISISTAT 35W (STAPLE) IMPLANT
SUT VIC AB 1 CT1 18XBRD ANBCTR (SUTURE) ×1 IMPLANT
SUT VIC AB 1 CT1 8-18 (SUTURE) ×1
SUT VIC AB 2-0 CP2 18 (SUTURE) ×1 IMPLANT
SUT VIC AB 3-0 SH 8-18 (SUTURE) ×1 IMPLANT
SYR 3ML LL SCALE MARK (SYRINGE) ×1 IMPLANT
TAP YUKON 3.0 DISP (TAP) IMPLANT
TAP YUKON 3.5 DISP (TAP) IMPLANT
TAP YUKON 4.0 DISP (TAP) IMPLANT
TOWEL GREEN STERILE (TOWEL DISPOSABLE) ×1 IMPLANT
TOWEL GREEN STERILE FF (TOWEL DISPOSABLE) ×1 IMPLANT
TRAY FOLEY MTR SLVR 16FR STAT (SET/KITS/TRAYS/PACK) ×1 IMPLANT
WATER STERILE IRR 1000ML POUR (IV SOLUTION) ×1 IMPLANT

## 2022-11-05 NOTE — Anesthesia Procedure Notes (Addendum)
Procedure Name: Intubation Date/Time: 11/05/2022 7:40 AM  Performed by: Cy Blamer, CRNAPre-anesthesia Checklist: Patient identified, Emergency Drugs available, Suction available and Patient being monitored Patient Re-evaluated:Patient Re-evaluated prior to induction Oxygen Delivery Method: Circle system utilized Preoxygenation: Pre-oxygenation with 100% oxygen Induction Type: IV induction Ventilation: Mask ventilation without difficulty Laryngoscope Size: Glidescope and 3 Grade View: Grade I Tube type: Oral Tube size: 7.0 mm Number of attempts: 1 Airway Equipment and Method: Video-laryngoscopy and Rigid stylet Placement Confirmation: ETT inserted through vocal cords under direct vision, positive ETCO2 and breath sounds checked- equal and bilateral Secured at: 22 cm Tube secured with: Tape Dental Injury: Teeth and Oropharynx as per pre-operative assessment  Comments: 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

## 2022-11-05 NOTE — Transfer of Care (Signed)
Immediate Anesthesia Transfer of Care Note  Patient: Angela [name]  Procedure(s) Performed: POSTERIOR CERVICAL INSTRUMENTATION AND FUSION CERVICAL SIX-THORACIC ONE (Back)  Patient Location: PACU  Anesthesia Type:General  Level of Consciousness: awake, alert , and oriented  Airway & Oxygen Therapy: Patient Spontanous Breathing and Patient connected to face mask oxygen  Post-op Assessment: Report given to RN, Post -op Vital signs reviewed and stable, Patient moving all extremities X 4, and Patient able to stick tongue midline  Post vital signs: Reviewed  Last Vitals:  Vitals Value Taken Time  BP 152-95   Temp 98.5   Pulse 87 11/05/22 0959  Resp 14   SpO2 97 % 11/05/22 0959  Vitals shown include unvalidated device data.  Last Pain:  Vitals:   11/05/22 0606  TempSrc:   PainSc: 8       Patients Stated Pain Goal: 3 (11/05/22 0606)  Complications: No notable events documented.

## 2022-11-05 NOTE — Anesthesia Postprocedure Evaluation (Signed)
Anesthesia Post Note  Patient: Angela [name]  Procedure(s) Performed: POSTERIOR CERVICAL INSTRUMENTATION AND FUSION CERVICAL SIX-THORACIC ONE (Back)     Patient location during evaluation: PACU Anesthesia Type: General Level of consciousness: awake and alert Pain management: satisfactory to patient Vital Signs Assessment: post-procedure vital signs reviewed and stable Respiratory status: spontaneous breathing, nonlabored ventilation and respiratory function stable Cardiovascular status: stable and blood pressure returned to baseline Anesthetic complications: no   No notable events documented.  Last Vitals:  Vitals:   11/05/22 1130 11/05/22 1145  BP: (!) 135/100 (!) 143/96  Pulse: 89 89  Resp: 13 12  Temp:    SpO2: 99% 97%                    Beryle Lathe

## 2022-11-05 NOTE — Progress Notes (Signed)
Spoke with Shari Prows with [employer] about pacemaker needing reprogramming this morning prior to 730am surgery start time. He will be here prior to surgery start time.

## 2022-11-05 NOTE — Op Note (Signed)
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 Kendell Bane C. Manisha Cancel, DO  ASSISTANT: Patrici Ranks, PA  ANESTHESIA: General Endotracheal  EBL: 50 cc  SPECIMENS: None  DRAINS: None  COMPLICATIONS: None  CONDITION: Hemodynamically stable  HISTORY: Angela [name] is a 52 y.o. female with a history of a C4-7 ACDF, complicated by C6-7 nonunion and fractured hardware of the bilateral screws at C7.  She had progressively worsening axial neck pain that failed conservative measures.  Given the nonunion and her worsening neck pain I recommended surgical intervention in the form of a posterior instrumentation and fusion C6-T1.  All risks, benefits and expected outcomes were discussed agreed upon.  PROCEDURE IN DETAIL: The patient was brought to the operating room. After induction of general anesthesia, the patient's head was placed in a Sugita head holder, the patient was positioned on the operative table in the prone position. All pressure points were meticulously padded. Skin incision was then marked out and prepped and draped in the usual sterile fashion. Physician driven timeout was performed.  Local anesthetic was injected into the planned incision.  Midline incision was performed sharply with a 10 blade over the C6-T1 spinous processes.  Sharp dissection was performed to the dorsal fascia.  Using Bovie electrocautery, bilateral subperiosteal dissection was performed to expose the C6 lamina,  lateral mass, C7 lamina lateral mass, T1 lamina and transverse processes bilaterally.  Lateral fluoroscopy confirmed the appropriate levels.  Using perforating towel clamps, I distracted on the C6 and C7 spinous processes and there was clear mobility consistent with nonunion.  Using a high-speed drill, pilot holes were created bilaterally in the C6 and C7 lateral masses and a superior lateral trajectory.  A 3.0 mm tap was used to a depth of 12 mm.  Ball-tipped probe confirmed bony borders in all directions and a bony floor.  The above listed lateral mass screws were placed with appropriate bony purchase.  Lateral fluoroscopy confirmed appropriate placement.  Using high-speed drill I then created a pilot hole for the T1 pedicle screws bilaterally.  Using pedicle finder, the pedicles were accessed to a depth of approximately 28 mm.  A 3.5 followed by a 4.0 mm tap was used.  Ball-tipped probe confirmed appropriate bony borders in all directions and a bony floor.  The above listed pedicle screws were placed and appropriate bony purchase was obtained.  AP and lateral fluoroscopy confirmed appropriate placement.  I then used a high-speed drill to decorticate the C6-7 facet joint, C7-T1 facet joints bilaterally.  The C7 and T1 spinous processes were removed and kept for autograft, mixed with allograft and placed in the lateral gutters bilaterally.  Appropriate size rods were measured, contoured and placed.  Setscrews were placed and final tightened to the manufacturer's recommendation.  Retractors were taken of the wound.  Soft tissue hemostasis was achieved with monopolar cautery.  Long-acting anesthetic was placed in the muscle and soft tissue.  Wound was noted to be excellently hemostatic.  The wound was then closed in layers with muscle and fascia with 1-0 Vicryl suture,  dermis was closed with 2-0 and 3-0 Vicryl suture.  Skin was closed with skin glue.  At the end of the case all sponge, needle, and instrument counts  were correct. The patient was then transferred to the stretcher, Sugita head holder removed, extubated, and taken to the post-anesthesia care unit in stable hemodynamic condition.

## 2022-11-05 NOTE — H&P (Signed)
Providing Compassionate, Quality Care - Together  NEUROSURGERY HISTORY & PHYSICAL   Angela [name] is an 52 y.o. female.   Chief Complaint: C6-7 nonunion HPI: This is a pleasant 52 year old female, with a history of a C4-7 ACDF, complicated by C6-7 nonunion with fractured hardware at C7.  Her pain had progressively worsened leading to workup revealing the nonunion.  She has had worsening neck pain and intermittent popping in her neck and therefore presents today for posterior cervical instrumentation and fusion C6-T1.  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 EF 50-55% normal coronaries   Complete heart block (HCC)    Depression    Dysautonomia (HCC)    Dyspnea    CT negative for PE, 2007   Hypertension    Insomnia    OCD (obsessive compulsive disorder)    SVT (supraventricular tachycardia)    s/p ablation a. c/b AV nod ablation requiring pacemaker    Past Surgical History:  Procedure Laterality Date   ANTERIOR CERVICAL DECOMP/DISCECTOMY FUSION N/A 09/20/2021   Procedure: Anterior Cervical Discectomy Fusion  Cervical Four-Five, Cervical Five-Six, Cervical Six-Seven;  Surgeon: Tyjae Shvartsman, Alan Mulder, DO;  Location: [facility] OR;  Service: Neurosurgery;  Laterality: N/A;  3C   BREAST BIOPSY Right 1998   BREAST BIOPSY Right 2004   BREAST LUMPECTOMY     RIGHT -BREAST    COLONOSCOPY WITH PROPOFOL N/A 09/07/2019   Procedure: COLONOSCOPY WITH PROPOFOL;  Surgeon: Midge Minium, MD;  Location: [facility] ENDOSCOPY;  Service: Endoscopy;  Laterality: N/A;   ENDOMETRIAL ABLATION  2005   INSERT / REPLACE / REMOVE PACEMAKER  2007   PELVIC LAPAROSCOPY  1990   PERMANENT PACEMAKER GENERATOR CHANGE N/A 07/13/2014   MDT MRI compatible dual chamber pacemaker implanted by Dr Farrel Gordon  2005   TUBAL LIGATION  2003    Family History  Problem Relation Age of Onset   Anxiety disorder  Mother    Arthritis Mother    Obesity Mother    Cervical cancer Mother    Hypertension Father    Cancer Father 6       COLON   Diabetes Father    Anxiety disorder Sister    Breast cancer Maternal Aunt        mastectomy   Cancer Maternal Aunt    Breast cancer Paternal Aunt    Cancer Paternal Aunt    Breast cancer Paternal Aunt    Dementia Maternal Grandmother    Hypertension Maternal Grandfather    Diabetes Paternal Grandmother    Breast cancer Paternal Grandmother    Arthritis Paternal Grandmother    Cancer Paternal Grandmother    Diabetes Paternal Grandfather    Cancer Paternal Grandfather    Social History:  reports that she has never smoked. She has never used smokeless tobacco. She reports that she does not drink alcohol and does not use drugs.  Allergies:  Allergies  Allergen Reactions   Toprol Xl [Metoprolol Succinate] Photosensitivity    Medications Prior to Admission  Medication Sig Dispense Refill   aspirin EC 81 MG tablet Take 81 mg by mouth daily. Swallow whole.     celecoxib (CELEBREX) 100 MG capsule Take 100 mg by mouth 2 (two) times daily.     diltiazem (CARDIZEM CD) 120 MG 24 hr capsule Take 1 capsule (120 mg total) by  mouth daily. 90 capsule 3   FIBER ADULT GUMMIES PO Take 2 each by mouth daily. Vita Fusion Fiberwell     hydroxychloroquine (PLAQUENIL) 200 MG tablet Take 400 mg by mouth daily.     LORazepam (ATIVAN) 0.5 MG tablet TAKE 1 TO 2 TABLETS(0.5 TO 1 MG) BY MOUTH DAILY AS NEEDED FOR ANXIETY 45 tablet 1   methocarbamol (ROBAXIN) 500 MG tablet Take 500 mg by mouth at bedtime.     Multiple Vitamins-Minerals (MULTIVITAMIN WITH MINERALS) tablet Take 1 tablet by mouth daily.     traMADol (ULTRAM) 50 MG tablet Take 50 mg by mouth every 6 (six) hours as needed for moderate pain.     venlafaxine XR (EFFEXOR XR) 75 MG 24 hr capsule Take 1 tab daily with her 150mg  for a total of 225mg  daily 90 capsule 1   venlafaxine XR (EFFEXOR-XR) 150 MG 24 hr capsule TAKE 1  CAPSULE(150 MG) BY MOUTH DAILY WITH BREAKFAST 90 capsule 1    Results for orders placed or performed during the hospital encounter of 11/05/22 (from the past 48 hour(s))  Pregnancy, urine POC     Status: None   Collection Time: 11/05/22  6:12 AM  Result Value Ref Range   Preg Test, Ur NEGATIVE NEGATIVE    Comment:        THE SENSITIVITY OF THIS METHODOLOGY IS >24 mIU/mL    No results found.  ROS All pertinent positives and negatives are listed HPI above  Blood pressure (!) 156/98, pulse 81, temperature 98.2 F (36.8 C), temperature source Oral, resp. rate 18, height 5\' 4"  (1.626 m), weight 74.4 kg, SpO2 100 %. Physical Exam  Awake alert oriented x 3, no acute distress PERRLA Speech fluent and appropriate Bilateral upper/lower extremities full strength throughout Nonlabored breathing Silt Cranial nerves II through XII intact  Assessment/Plan 52 year old female with  C6-7 nonunion with hardware failure  -OR today for posterior cervical instrumentation and fusion C6-7, C7-T1.  We discussed all risks, benefits and expected outcomes as well as alternatives to treatment.  Informed consent was obtained and witnessed.  Answered all of her questions.  Thank you for allowing me to participate in this patient's care.  Please do not hesitate to call with questions or concerns.   Monia Pouch, DO Neurosurgeon [facility] Cell: [phone]

## 2022-11-06 ENCOUNTER — Encounter (HOSPITAL_COMMUNITY): Payer: Self-pay | Admitting: Neurological Surgery

## 2022-11-06 MED ORDER — OXYCODONE HCL 10 MG PO TABS: 10.0000 mg | ORAL_TABLET | ORAL | 0 refills | Status: DC | PRN

## 2022-11-06 MED ORDER — METHOCARBAMOL 500 MG PO TABS: 500.0000 mg | ORAL_TABLET | Freq: Four times a day (QID) | ORAL | 3 refills | Status: DC | PRN

## 2022-11-06 NOTE — Evaluation (Signed)
Occupational Therapy Evaluation Patient Details Name: Angela [name] MRN: [id number] DOB: May 03, 1971 Today's Date: 11/06/2022   History of Present Illness Pt is a 52 y.o. female s/p Posterior instrumentation and fusion C6-T1. PMH significant for arthritis, ACDF C4-7, complete heart block, dysautonomia, HTN, insomnia, OCD, SVT.   Clinical Impression   PTA, pt lived with her husband and was independent. Upon eval, pt requiring Mod I for ADL. Pt educated and demonstrating compensatory techniques for bed mobility, UB ADL, LB ADL, brace application, toileting, shower transfers, and grooming within precautions. Pt with POTS per her report and several "mishaps" related to the bathroom, thus, recommending BSC to decr falls associated with trips to restroom. All education provided and questions answered. Recommending discharge home with no OT follow at this time. Husband available to help as needed. OT to sign off. Please re-consult if change in status.       Recommendations for follow up therapy are one component of a multi-disciplinary discharge planning process, led by the attending physician.  Recommendations may be updated based on patient status, additional functional criteria and insurance authorization.   Assistance Recommended at Discharge Intermittent Supervision/Assistance  Patient can return home with the following Help with stairs or ramp for entrance;Assist for transportation;Assistance with cooking/housework    Functional Status Assessment  Patient has had a recent decline in their functional status and demonstrates the ability to make significant improvements in function in a reasonable and predictable amount of time.  Equipment Recommendations  BSC/3in1    Recommendations for Other Services       Precautions / Restrictions Precautions Precautions: Cervical Precaution Booklet Issued: Yes (comment) Precaution Comments: all brecautions reviewed during ADL Required Braces or  Orthoses: Cervical Brace Cervical Brace: Hard collar Restrictions Weight Bearing Restrictions: No      Mobility Bed Mobility Overal bed mobility: Modified Independent             General bed mobility comments: increased time for use of log roll    Transfers Overall transfer level: Modified independent Equipment used: None                      Balance Overall balance assessment: Modified Independent                                         ADL either performed or assessed with clinical judgement   ADL Overall ADL's : Modified independent                                       General ADL Comments: Reviewed compensatort strategies for ADL.     Vision Ability to See in Adequate Light: 0 Adequate Patient Visual Report: No change from baseline Vision Assessment?: No apparent visual deficits     Perception Perception Perception Tested?: No   Praxis Praxis Praxis tested?: Not tested    Pertinent Vitals/Pain Pain Assessment Pain Assessment: Faces Faces Pain Scale: Hurts a little bit Pain Location: operative site Pain Descriptors / Indicators: Operative site guarding Pain Intervention(s): Limited activity within patient's tolerance, Monitored during session     Hand Dominance     Extremity/Trunk Assessment Upper Extremity Assessment Upper Extremity Assessment: Generalized weakness;RUE deficits/detail RUE Deficits / Details: pain, decr strength as compared to L   Lower Extremity Assessment Lower Extremity  Assessment: Overall WFL for tasks assessed   Cervical / Trunk Assessment Cervical / Trunk Assessment: Neck Surgery   Communication Communication Communication: No difficulties   Cognition Arousal/Alertness: Awake/alert Behavior During Therapy: WFL for tasks assessed/performed Overall Cognitive Status: Within Functional Limits for tasks assessed                                       General  Comments  Pt with POTS per her report; reviewing compensatory strategies for safety during ADL. Pt reports some "mishaps" with certain transfers. Due to POTS, pt demonstrates incresaed fall risk especially at night time when first waking, thus recommending BSC for use in her bedroom to decr likelihood of falls during night time trips to restroom.    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        OT Problem List: Decreased strength;Decreased activity tolerance;Decreased knowledge of precautions;Decreased knowledge of use of DME or AE;Impaired UE functional use      OT Treatment/Interventions:      OT Goals(Current goals can be found in the care plan section) Acute Rehab OT Goals Patient Stated Goal: no more neck surgery OT Goal Formulation: With patient  OT Frequency:      Co-evaluation              AM-PAC OT "6 Clicks" Daily Activity     Outcome Measure Help from another person eating meals?: None Help from another person taking care of personal grooming?: None Help from another person toileting, which includes using toliet, bedpan, or urinal?: None Help from another person bathing (including washing, rinsing, drying)?: None Help from another person to put on and taking off regular upper body clothing?: None Help from another person to put on and taking off  regular lower body clothing?: None 6 Click Score: 24   End of Session Equipment Utilized During Treatment: Cervical collar Nurse Communication: Mobility status  Activity Tolerance: Patient tolerated treatment well Patient left: in bed;with call bell/phone within reach  OT Visit Diagnosis: Unsteadiness on feet (R26.81);Muscle weakness (generalized) (M62.81)                Time: 1610-9604 OT Time Calculation (min): 22 min Charges:  OT General Charges $OT Visit: 1 Visit OT Evaluation $OT Eval Low Complexity: 1 Low  11/06/2022 Myrla Halsted, OTD, OTR/L [facility] Acute Rehabilitation Office: [phone]  Myrla Halsted 11/06/2022, 8:54 AM

## 2022-11-06 NOTE — Discharge Instructions (Signed)
Wound Care Remove outer dressing in 3 days Leave incision open to air. You may shower. Do not scrub directly on incision.  Do not put any creams, lotions, or ointments on incision. Activity Walk each and every day, increasing distance each day. No lifting greater than 8 lbs.  Avoid excessive neck motion. No driving for 2 weeks; may ride as a passenger locally. Wear neck brace .  If provided soft collar, may wear for comfort unless otherwise instructed. Diet Resume your normal diet.  Return to Work Will be discussed at you follow up appointment. Call Your Doctor If Any of These Occur Redness, drainage, or swelling at the wound.  Temperature greater than 101 degrees. Severe pain not relieved by pain medication. Increased difficulty swallowing. Incision starts to come apart. Follow Up Appt Call [phone])  for problems.  If you have any hardware placed in your spine, you will need an x-ray before your appointment.

## 2022-11-06 NOTE — Plan of Care (Signed)

## 2022-11-06 NOTE — Progress Notes (Signed)
PT Cancellation Note and Discharge  Patient Details Name: Angela [name] MRN: [id number] DOB: 07/14/1970   Cancelled Treatment:    Reason Eval/Treat Not Completed: PT screened, no needs identified, will sign off. Discussed pt case with OT who reports pt is currently mobilizing at a modified independent level and does not require a formal PT evaluation at this time. PT signing off. If needs change, please reconsult.     Marylynn Pearson 11/06/2022, 9:52 AM  Conni Slipper, PT, DPT Acute Rehabilitation Services Secure Chat Preferred Office: [phone]

## 2022-11-06 NOTE — Discharge Summary (Signed)
  Discharge Summary   Date of Admission: 11/05/22   Date of Discharge: 11/06/22   Attending Physician:  [facility] Course: Patient was admitted following an uncomplicated PSF C6-T1. They were recovered in PACU and transferred to [facility]. Their preop symptoms were improved. Pt to f/u in office in 2 weeks for post op visit. Pt is in agreement w/ plan.    Neurologic exam at discharge:  Strength 5/5 x4.  SILTx4.  Incision c/d/I.      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

## 2022-11-06 NOTE — Progress Notes (Signed)
Patient alert and oriented, mae's well, voiding adequate amount of urine, swallowing without difficulty, c/o mild pain at time of discharge. Patient discharged home with family. Script and discharged instructions given to patient. Patient and husband stated understanding of instructions given. Patient has an appointment with Dr. Jake Samples.

## 2022-11-07 ENCOUNTER — Telehealth: Payer: Self-pay | Admitting: *Deleted

## 2022-11-07 NOTE — Transitions of Care (Post Inpatient/ED Visit) (Signed)
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 Incision starts to come apart. Per pat tested + for Stap with PCR. Wanted to know if she be on Atbx. Advised to call Dr Dawley office. Patient Questions/Concerns Addressed: Other:  Items Reviewed: Did you receive and understand the discharge instructions provided?: Yes Medications obtained,verified, and reconciled?: Yes (Medications Reviewed) Any new allergies since your discharge?: No Dietary orders reviewed?: No Do you have support at home?: Yes People in Home: spouse Name of Support/Comfort Primary Source: Darin  Medications Reviewed Today: Medications Reviewed Today     Reviewed by Luella Cook, RN (Case Manager) on 11/07/22 at 1422  Med List Status: <None>   Medication Order Taking? Sig Documenting Provider Last Dose Status Informant  diltiazem (CARDIZEM CD) 120 MG 24 hr capsule [id number] Yes Take 1 capsule (120 mg total) by mouth daily. Duke Salvia, MD Taking Active Self  FIBER ADULT GUMMIES PO [id number] Yes Take 2 each by mouth daily. Vita Fusion Fiberwell [provider] Taking Active Self  hydroxychloroquine (PLAQUENIL) 200 MG tablet [id number]  Take 2 tablets (400 mg total) by mouth daily. Patrici Ranks [place], PA-C  Active   LORazepam (ATIVAN) 0.5 MG tablet [id number] Yes TAKE  1 TO 2 TABLETS(0.5 TO 1 MG) BY MOUTH DAILY AS NEEDED FOR ANXIETY Johnson, Megan P, DO Taking Active Self  methocarbamol (ROBAXIN) 500 MG tablet [id number] Yes Take 1 tablet (500 mg total) by mouth every 6 (six) hours as needed for muscle spasms. Patrici Ranks [place], PA-C Taking Active   Multiple Vitamins-Minerals (MULTIVITAMIN WITH MINERALS) tablet [id number] Yes Take 1 tablet by mouth daily. [provider] Taking Active Self  oxyCODONE 10 MG TABS [id number] Yes Take 1 tablet (10 mg total) by mouth every 4 (four) hours as needed for severe pain ((score 7 to 10)). Patrici Ranks [place], PA-C Taking Active   traMADol Janean Sark) 50 MG tablet [id number] Yes Take 50 mg by mouth every 6 (six) hours as needed for moderate pain. [provider] Taking Active Self  venlafaxine XR (EFFEXOR-XR) 150 MG 24 hr capsule [id number] Yes TAKE 1 CAPSULE(150 MG) BY MOUTH DAILY WITH BREAKFAST  Patient taking differently: 150 mg daily with breakfast. TAKE 1 CAPSULE(150 MG) BY MOUTH DAILY WITH BREAKFAST   Johnson, Megan P, DO Taking Active Self            Home Care and Equipment/Supplies: Were Home Health Services Ordered?: NA Any new equipment or medical supplies ordered?: NA  Functional Questionnaire: Do you need assistance with bathing/showering or dressing?: Yes Do you need assistance with meal preparation?: Yes Do you need assistance with eating?: No Do you have difficulty maintaining continence: No Do you need assistance with getting out of bed/getting out of a chair/moving?: No Do you have difficulty managing or taking your medications?: No  Follow up appointments reviewed: PCP Follow-up appointment confirmed?: NA Specialist Hospital Follow-up appointment confirmed?: Yes Date of Specialist follow-up appointment?: 11/19/22 Follow-Up Specialty  Provider:: Dr Dawley Do you need transportation to your follow-up appointment?: No Do you understand care options if your condition(s) worsen?:  Yes-patient verbalized understanding   Interventions Today    Flowsheet Row Most Recent Value  General Interventions   General Interventions Discussed/Reviewed General Interventions Discussed, General Interventions Reviewed, Doctor Visits  Doctor Visits Discussed/Reviewed Doctor Visits Discussed, Doctor Visits Reviewed  Pharmacy Interventions   Pharmacy Dicussed/Reviewed Pharmacy Topics Discussed, Pharmacy Topics Reviewed      TOC Interventions Today    Flowsheet Row Most Recent Value  TOC Interventions   TOC Interventions Discussed/Reviewed TOC Interventions Discussed, TOC Interventions Reviewed, Post op wound/incision care, S/S of infection       Gean Maidens BSN RN [facility] [phone]

## 2022-11-08 ENCOUNTER — Ambulatory Visit (INDEPENDENT_AMBULATORY_CARE_PROVIDER_SITE_OTHER): Payer: Medicare PPO

## 2022-11-08 DIAGNOSIS — I442 Atrioventricular block, complete: Secondary | ICD-10-CM | POA: Diagnosis not present

## 2022-11-08 LAB — CUP PACEART REMOTE DEVICE CHECK
Battery Remaining Longevity: 11 mo
Battery Voltage: 2.89 V
Brady Statistic AP VP Percent: 0.06 %
Brady Statistic AP VS Percent: 0 %
Brady Statistic AS VP Percent: 99.94 %
Brady Statistic AS VS Percent: 0 %
Brady Statistic RA Percent Paced: 0.06 %
Brady Statistic RV Percent Paced: 100 %
Date Time Interrogation Session: 20240607081809
Implantable Lead Connection Status: 753985
Implantable Lead Connection Status: 753985
Implantable Lead Implant Date: 20070319
Implantable Lead Implant Date: 20070319
Implantable Lead Location: 753859
Implantable Lead Location: 753860
Implantable Lead Model: 5076
Implantable Lead Model: 5076
Implantable Pulse Generator Implant Date: 20160210
Lead Channel Impedance Value: 285 Ohm
Lead Channel Impedance Value: 399 Ohm
Lead Channel Impedance Value: 456 Ohm
Lead Channel Impedance Value: 456 Ohm
Lead Channel Pacing Threshold Amplitude: 0.625 V
Lead Channel Pacing Threshold Amplitude: 1 V
Lead Channel Pacing Threshold Pulse Width: 0.4 ms
Lead Channel Pacing Threshold Pulse Width: 0.4 ms
Lead Channel Sensing Intrinsic Amplitude: 1.875 mV
Lead Channel Sensing Intrinsic Amplitude: 1.875 mV
Lead Channel Sensing Intrinsic Amplitude: 5 mV
Lead Channel Sensing Intrinsic Amplitude: 5.125 mV
Lead Channel Setting Pacing Amplitude: 2 V
Lead Channel Setting Pacing Amplitude: 2.5 V
Lead Channel Setting Pacing Pulse Width: 0.4 ms
Lead Channel Setting Sensing Sensitivity: 2 mV
Zone Setting Status: 755011
Zone Setting Status: 755011

## 2022-11-20 DIAGNOSIS — Z6827 Body mass index (BMI) 27.0-27.9, adult: Secondary | ICD-10-CM | POA: Diagnosis not present

## 2022-11-20 DIAGNOSIS — M4802 Spinal stenosis, cervical region: Secondary | ICD-10-CM | POA: Diagnosis not present

## 2022-11-25 NOTE — Progress Notes (Signed)
Remote pacemaker transmission.   

## 2022-12-16 ENCOUNTER — Ambulatory Visit: Payer: Medicare PPO | Admitting: Family Medicine

## 2022-12-20 DIAGNOSIS — M25551 Pain in right hip: Secondary | ICD-10-CM | POA: Diagnosis not present

## 2022-12-26 ENCOUNTER — Encounter: Payer: Self-pay | Admitting: Internal Medicine

## 2022-12-26 NOTE — Telephone Encounter (Signed)
Reviewed manual transmission.  Normal Device Function No Episodes  AT/AF burden:  less than 0.1% (1, 3 sec AT/AF episode, controlled V rate). Nothing that would correlate with symptoms.  Histograms/Cardiac Compass appropriate.   Battery:  10months until RRT  Spoke with patient and informed her of above. I recommended she check her blood pressure and will forward back to general cardiology if any further recommendations.  She has been "afraid:" to check her BP because senses it may be really high.  Nothing correlates electrically with device. She denies any chest pain, acute SOB, dizziness or near syncopal events at present.    She has ER precautions if sx return or worsen.  Patient reports being under extreme amounts stress, we discussed how this could negatively impact her cardiac health and she should not overlook any concerning symptoms.   Patient verbalizes understanding.

## 2022-12-29 ENCOUNTER — Other Ambulatory Visit: Payer: Self-pay | Admitting: Internal Medicine

## 2022-12-30 ENCOUNTER — Ambulatory Visit: Payer: Medicare PPO | Attending: Cardiology | Admitting: Cardiology

## 2022-12-30 ENCOUNTER — Encounter: Payer: Self-pay | Admitting: Cardiology

## 2022-12-30 VITALS — BP 164/118 | HR 90 | Ht 64.0 in | Wt 168.4 lb

## 2022-12-30 DIAGNOSIS — I471 Supraventricular tachycardia, unspecified: Secondary | ICD-10-CM

## 2022-12-30 DIAGNOSIS — Z950 Presence of cardiac pacemaker: Secondary | ICD-10-CM | POA: Diagnosis not present

## 2022-12-30 DIAGNOSIS — R030 Elevated blood-pressure reading, without diagnosis of hypertension: Secondary | ICD-10-CM | POA: Diagnosis not present

## 2022-12-30 NOTE — Progress Notes (Signed)
Cardiology Office Note:    Date:  12/30/2022   ID:  Angela [name], DOB 12-27-1970, MRN [id number]  PCP:  Dorcas Carrow, DO   [facility] Providers Cardiologist:  Debbe Odea, MD     Referring MD: Dorcas Carrow, DO   Chief Complaint  Patient presents with   Acute Visit    Patient concerned with elevated blood pressure with intermittent chest pain, SOBr, and headache.  Does bring in home bp readings.    History of Present Illness:    Angela [name] is a 52 y.o. female with a hx of SVT s/p ablation complicated by AV node ablation requiring pacemaker, Raynaud's, dysautonomia presenting due to elevated BP.  Patient has noticed her blood pressure becoming elevated over the past month or so initially thought her symptoms were secondary to neck pain.  She underwent cervical fusion surgery last month 11/2022 with improvement in neck pain but her blood pressures have stayed elevated.  Endorse having a bursitis flareup involving her right hip.  Also has a history of anxiety, ran out of her Ativan.  Has a history of dysautonomia, with symptoms of dizziness, currently adequately controlled, has not had any symptoms over the past 7 years.  Takes Cardizem 120 mg daily due to history of SVT.  Her blood pressures at home range around systolic 140s to 956O.  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 EF 50-55% normal coronaries   Complete heart block (HCC)    Depression    Dysautonomia (HCC)    Dyspnea    CT negative for PE, 2007   Hypertension    Insomnia    OCD (obsessive compulsive disorder)    SVT (supraventricular tachycardia)    s/p ablation a. c/b AV nod ablation requiring pacemaker    Past Surgical History:   Procedure Laterality Date   ANTERIOR CERVICAL DECOMP/DISCECTOMY FUSION N/A 09/20/2021   Procedure: Anterior Cervical Discectomy Fusion  Cervical Four-Five, Cervical Five-Six, Cervical Six-Seven;  Surgeon: Dawley, Alan Mulder, DO;  Location: [facility] OR;  Service: Neurosurgery;  Laterality: N/A;  3C   BREAST BIOPSY Right 1998   BREAST BIOPSY Right 2004   BREAST LUMPECTOMY     RIGHT -BREAST    COLONOSCOPY WITH PROPOFOL N/A 09/07/2019   Procedure: COLONOSCOPY WITH PROPOFOL;  Surgeon: Midge Minium, MD;  Location: [facility] ENDOSCOPY;  Service: Endoscopy;  Laterality: N/A;   ENDOMETRIAL ABLATION  2005   INSERT / REPLACE / REMOVE PACEMAKER  2007   PELVIC LAPAROSCOPY  1990   PERMANENT PACEMAKER GENERATOR CHANGE N/A 07/13/2014   MDT MRI compatible dual chamber pacemaker implanted by Dr Graciela Husbands   POSTERIOR CERVICAL FUSION/FORAMINOTOMY N/A 11/05/2022   Procedure: POSTERIOR CERVICAL INSTRUMENTATION AND FUSION CERVICAL SIX-THORACIC ONE;  Surgeon: Bethann Goo, DO;  Location: [facility] OR;  Service: Neurosurgery;  Laterality: N/A;  3C   SPARK  2005   TUBAL LIGATION  2003    Current Medications: Current Meds  Medication Sig   diltiazem (CARDIZEM CD) 120 MG 24 hr capsule Take 1 capsule (120 mg total) by mouth daily.   FIBER ADULT GUMMIES PO Take 2 each by mouth daily. Vita Fusion Fiberwell   hydroxychloroquine (PLAQUENIL) 200 MG tablet Take 2  tablets (400 mg total) by mouth daily.   LORazepam (ATIVAN) 0.5 MG tablet TAKE 1 TO 2 TABLETS(0.5 TO 1 MG) BY MOUTH DAILY AS NEEDED FOR ANXIETY   methocarbamol (ROBAXIN) 500 MG tablet Take 1 tablet (500 mg total) by mouth every 6 (six) hours as needed for muscle spasms.   Multiple Vitamins-Minerals (MULTIVITAMIN WITH MINERALS) tablet Take 1 tablet by mouth daily.   oxyCODONE 10 MG TABS Take 1 tablet (10 mg total) by mouth every 4 (four) hours as needed for severe pain ((score 7 to 10)).   traMADol (ULTRAM) 50 MG tablet Take 50 mg by mouth every 6 (six) hours as needed for moderate  pain.   venlafaxine XR (EFFEXOR-XR) 150 MG 24 hr capsule TAKE 1 CAPSULE(150 MG) BY MOUTH DAILY WITH BREAKFAST (Patient taking differently: 150 mg daily with breakfast. TAKE 1 CAPSULE(150 MG) BY MOUTH DAILY WITH BREAKFAST)     Allergies:   Toprol xl [metoprolol succinate]   Social History   Socioeconomic History   Marital status: Married    Spouse name: Darin   Number of children: 2   Years of education: Not on file   Highest education level: Bachelor's degree (e.g., BA, AB, BS)  Occupational History   Occupation: disability   Tobacco Use   Smoking status: Never   Smokeless tobacco: Never  Vaping Use   Vaping status: Never Used  Substance and Sexual Activity   Alcohol use: No   Drug use: No   Sexual activity: Yes    Birth control/protection: Surgical    Comment: Uterine ablarion 2002  Other Topics Concern   Not on file  Social History Narrative   Work with daughters school    Social Determinants of Health   Financial Resource Strain: Low Risk  (07/31/2021)   Overall Financial Resource Strain (CARDIA)    Difficulty of Paying Living Expenses: Not hard at all  Food Insecurity: No Food Insecurity (07/31/2021)   Hunger Vital Sign    Worried About Running Out of Food in the Last Year: Never true    Ran Out of Food in the Last Year: Never true  Transportation Needs: No Transportation Needs (07/31/2021)   PRAPARE - Administrator, Civil Service (Medical): No    Lack of Transportation (Non-Medical): No  Physical Activity: Insufficiently Active (07/31/2021)   Exercise Vital Sign    Days of Exercise per Week: 2 days    Minutes of Exercise per Session: 20 min  Stress: No Stress Concern Present (07/31/2021)   [employer] of Occupational Health - Occupational Stress Questionnaire    Feeling of Stress : Only a little  Social Connections: Socially Integrated (07/31/2021)   Social Connection and Isolation Panel [NHANES]    Frequency of Communication with Friends and  Family: Three times a week    Frequency of Social Gatherings with Friends and Family: Three times a week    Attends Religious Services: More than 4 times per year    Active Member of Clubs or Organizations: Yes    Attends Engineer, structural: More than 4 times per year    Marital Status: Married     Family History: The patient's family history includes Anxiety disorder in her mother and sister; Arthritis in her mother and paternal grandmother; Breast cancer in her maternal aunt, paternal aunt, paternal aunt, and paternal grandmother; Cancer in her maternal aunt, paternal aunt, paternal grandfather, and paternal grandmother; Cancer (age of onset: 62) in her father; Cervical cancer in her mother;  Dementia in her maternal grandmother; Diabetes in her father, paternal grandfather, and paternal grandmother; Hypertension in her father and maternal grandfather; Obesity in her mother.  ROS:   Please see the history of present illness.     All other systems reviewed and are negative.  EKGs/Labs/Other Studies Reviewed:    The following studies were reviewed today:  EKG Interpretation Date/Time:  Monday December 30 2022 13:40:57 EDT Ventricular Rate:  90 PR Interval:  184 QRS Duration:  148 QT Interval:  416 QTC Calculation: 508 R Axis:   84  Text Interpretation: Atrial-sensed ventricular-paced rhythm Confirmed by Debbe Odea ([id number]) on 12/30/2022 1:53:28 PM    Recent Labs: 08/08/2022: ALT 22; TSH 1.110 10/29/2022: BUN 11; Creatinine, Ser 0.82; Hemoglobin 15.0; Platelets 255; Potassium 4.1; Sodium 136  Recent Lipid Panel    Component Value Date/Time   CHOL 196 08/08/2022 1038   TRIG 106 08/08/2022 1038   HDL 75 08/08/2022 1038   CHOLHDL 3.0 03/19/2019 1337   LDLCALC 102 (H) 08/08/2022 1038     Risk Assessment/Calculations:    HYPERTENSION CONTROL Vitals:   12/30/22 1334 12/30/22 1344  BP: (!) 168/112 (!) 164/118    The patient's blood pressure is elevated above target  today.  In order to address the patient's elevated BP: Blood pressure will be monitored at home to determine if medication changes need to be made.            Physical Exam:    VS:  BP (!) 164/118 (BP Location: Left Arm, Patient Position: Sitting, Cuff Size: Normal)   Pulse 90   Ht 5\' 4"  (1.626 m)   Wt 168 lb 6.4 oz (76.4 kg)   SpO2 99%   BMI 28.91 kg/m     Wt Readings from Last 3 Encounters:  12/30/22 168 lb 6.4 oz (76.4 kg)  11/05/22 164 lb (74.4 kg)  10/29/22 169 lb (76.7 kg)     GEN:  Well nourished, well developed in no acute distress HEENT: Normal NECK: No JVD; No carotid bruits CARDIAC: RRR, no murmurs, rubs, gallops RESPIRATORY:  Clear to auscultation without rales, wheezing or rhonchi  ABDOMEN: Soft, non-tender, non-distended MUSCULOSKELETAL:  No edema; No deformity  SKIN: Warm and dry NEUROLOGIC:  Alert and oriented x 3 PSYCHIATRIC:  Normal affect   ASSESSMENT:    1. Elevated BP without diagnosis of hypertension   2. SVT (supraventricular tachycardia)   3. Pacemaker    PLAN:    In order of problems listed above:  Elevated BP lately, BP usually controlled.  If recent bursitis flareup, lack of sleep/restless leg is contributing.  Advised her to take Ativan as prescribed, have her bursitis managed adequately per PCP.  Check BP at home when not anxious and also not in pain to see if elevated BP persists.  Will avoid adding BP medications at this time due to her strong history of dysautonomia which is currently well-controlled. History of SVT, continue Cardizem CD 120 mg daily.  Obtain echocardiogram. Pacemaker, appears to be functioning normally, continue monitoring as per device clinic.   Follow-up after echocardiogram.      Medication Adjustments/Labs and Tests Ordered: Current medicines are reviewed at length with the patient today.  Concerns regarding medicines are outlined above.  Orders Placed This Encounter  Procedures   EKG 12-Lead    ECHOCARDIOGRAM COMPLETE   No orders of the defined types were placed in this encounter.   Patient Instructions  Medication Instructions:   Your physician recommends that you continue  on your current medications as directed. Please refer to the Current Medication list given to you today.  *If you need a refill on your cardiac medications before your next appointment, please call your pharmacy*   Lab Work:  None Ordered  If you have labs (blood work) drawn today and your tests are completely normal, you will receive your results only by: MyChart Message (if you have MyChart) OR A paper copy in the mail If you have any lab test that is abnormal or we need to change your treatment, we will call you to review the results.   Testing/Procedures:  Your physician has requested that you have an echocardiogram. Echocardiography is a painless test that uses sound waves to create images of your heart. It provides your doctor with information about the size and shape of your heart and how well your heart's chambers and valves are working. This procedure takes approximately one hour. There are no restrictions for this procedure. Please do NOT wear cologne, perfume, aftershave, or lotions (deodorant is allowed). Please arrive 15 minutes prior to your appointment time.    Follow-Up: At [facility], you and your health needs are our priority.  As part of our continuing mission to provide you with exceptional heart care, we have created designated Provider Care Teams.  These Care Teams include your primary Cardiologist (physician) and Advanced Practice Providers (APPs -  Physician Assistants and Nurse Practitioners) who all work together to provide you with the care you need, when you need it.  We recommend signing up for the patient portal called "MyChart".  Sign up information is provided on this After Visit Summary.  MyChart is used to connect with patients for Virtual Visits  (Telemedicine).  Patients are able to view lab/test results, encounter notes, upcoming appointments, etc.  Non-urgent messages can be sent to your provider as well.   To learn more about what you can do with MyChart, go to [email or web address].    Your next appointment:    After Echocardiogram  Provider:   You may see Debbe Odea, MD or one of the following Advanced Practice Providers on your designated Care Team:   Nicolasa Ducking, NP Eula Listen, PA-C Cadence Fransico Michael, PA-C Charlsie Quest, NP   Signed, Debbe Odea, MD  12/30/2022 3:10 PM    [facility]

## 2022-12-30 NOTE — Patient Instructions (Signed)
Medication Instructions:   Your physician recommends that you continue on your current medications as directed. Please refer to the Current Medication list given to you today.  *If you need a refill on your cardiac medications before your next appointment, please call your pharmacy*   Lab Work:  None Ordered  If you have labs (blood work) drawn today and your tests are completely normal, you will receive your results only by: MyChart Message (if you have MyChart) OR A paper copy in the mail If you have any lab test that is abnormal or we need to change your treatment, we will call you to review the results.   Testing/Procedures:  Your physician has requested that you have an echocardiogram. Echocardiography is a painless test that uses sound waves to create images of your heart. It provides your doctor with information about the size and shape of your heart and how well your heart's chambers and valves are working. This procedure takes approximately one hour. There are no restrictions for this procedure. Please do NOT wear cologne, perfume, aftershave, or lotions (deodorant is allowed). Please arrive 15 minutes prior to your appointment time.    Follow-Up: At [facility], you and your health needs are our priority.  As part of our continuing mission to provide you with exceptional heart care, we have created designated Provider Care Teams.  These Care Teams include your primary Cardiologist (physician) and Advanced Practice Providers (APPs -  Physician Assistants and Nurse Practitioners) who all work together to provide you with the care you need, when you need it.  We recommend signing up for the patient portal called "MyChart".  Sign up information is provided on this After Visit Summary.  MyChart is used to connect with patients for Virtual Visits (Telemedicine).  Patients are able to view lab/test results, encounter notes, upcoming appointments, etc.  Non-urgent messages can  be sent to your provider as well.   To learn more about what you can do with MyChart, go to [email or web address].    Your next appointment:    After Echocardiogram  Provider:   You may see Brian Agbor-Etang, MD or one of the following Advanced Practice Providers on your designated Care Team:   Christopher Berge, NP Ryan Dunn, PA-C Cadence Furth, PA-C Sheri Hammock, NP 

## 2023-01-17 ENCOUNTER — Ambulatory Visit: Payer: Medicare PPO | Admitting: Family Medicine

## 2023-01-17 ENCOUNTER — Encounter: Payer: Self-pay | Admitting: Family Medicine

## 2023-01-17 VITALS — BP 122/82 | HR 94 | Temp 98.2°F | Wt 169.8 lb

## 2023-01-17 DIAGNOSIS — I10 Essential (primary) hypertension: Secondary | ICD-10-CM

## 2023-01-17 DIAGNOSIS — F339 Major depressive disorder, recurrent, unspecified: Secondary | ICD-10-CM | POA: Diagnosis not present

## 2023-01-17 MED ORDER — VENLAFAXINE HCL ER 150 MG PO CP24
ORAL_CAPSULE | ORAL | 1 refills | Status: DC
Start: 2023-01-17 — End: 2023-08-11

## 2023-01-17 MED ORDER — LORAZEPAM 0.5 MG PO TABS
ORAL_TABLET | ORAL | 1 refills | Status: DC
Start: 2023-01-17 — End: 2023-08-18

## 2023-01-17 NOTE — Patient Instructions (Signed)
Evening Primrose oil Black Cohash

## 2023-01-17 NOTE — Assessment & Plan Note (Signed)
Under good control on current regimen. Continue current regimen. Continue to monitor. Call with any concerns. Refills given.   

## 2023-01-17 NOTE — Assessment & Plan Note (Signed)
Continue to follow with cardiology- they will adjust doses as needed. Call with any concerns.

## 2023-01-17 NOTE — Progress Notes (Signed)
BP 122/82   Pulse 94   Temp 98.2 F (36.8 C) (Oral)   Wt 169 lb 12.8 oz (77 kg)   SpO2 98%   BMI 29.15 kg/m    Subjective:    Patient ID: Angela [name], female    DOB: 06-03-1971, 52 y.o.   MRN: [id number]  HPI: Angela [name] is a 52 y.o. female  Chief Complaint  Patient presents with   Hypertension   Has been having issues with burtitis in her R hip. Has had 3 shots in it. She saw ortho about a month ago. She is off anti-inflammatories due to her hip. She has not done any PT.   She is doing better with her neck but continues with some nerve pain. She has not been sleeping well because of that.  HYPERTENSION- has been following with cardiology and they have been holding off on changing her medicine if they don't have to. To have repeat ECHO done at the end of the month Hypertension status: stable  Satisfied with current treatment? yes Duration of hypertension: chronic BP monitoring frequency:  a few times a week BP range:  BP medication side effects:  no Medication compliance: excellent compliance Previous BP meds: diltiazem Aspirin: no Recurrent headaches: no Visual changes: no Palpitations: yes Dyspnea: no Chest pain: no Lower extremity edema: no Dizzy/lightheaded: yes  ANXIETY/DEPRESSION Duration: chronic Status:stable Anxious mood: yes  Excessive worrying: no Irritability: no  Sweating: yes Nausea: no Palpitations:yes Hyperventilation: no Panic attacks: no Agoraphobia: no  Obscessions/compulsions: no Depressed mood: yes    01/17/2023    3:13 PM 08/08/2022    9:31 AM 01/08/2022    9:04 AM 07/31/2021    9:27 AM 07/11/2021    9:34 AM  Depression screen PHQ 2/9  Decreased Interest 2 1 1 1 1   Down, Depressed, Hopeless 1 1 1 1 1   PHQ - 2 Score 3 2 2 2 2   Altered sleeping 3 3 2 1 3   Tired, decreased energy 2 2 1 2 2   Change in appetite 1 2 0 0 0  Feeling bad or failure about yourself  1 0 0 0 0  Trouble concentrating 2 2 1  0 2  Moving slowly or  fidgety/restless 1 2 0 0 0  Suicidal thoughts 0 0 0 0 0  PHQ-9 Score 13 13 6 5 9   Difficult doing work/chores Very difficult Very difficult Somewhat difficult Not difficult at all Not difficult at all   Anhedonia: no Weight changes: no Insomnia: yes   Hypersomnia: no Fatigue/loss of energy: yes Feelings of worthlessness: no Feelings of guilt: no Impaired concentration/indecisiveness: no Suicidal ideations: no  Crying spells: no Recent Stressors/Life Changes: no   Relationship problems: no   Family stress: no     Financial stress: no    Job stress: no    Recent death/loss: no   Relevant past medical, surgical, family and social history reviewed and updated as indicated. Interim medical history since our last visit reviewed. Allergies and medications reviewed and updated.  Review of Systems  Constitutional:  Positive for diaphoresis. Negative for activity change, appetite change, chills, fatigue, fever and unexpected weight change.  Respiratory: Negative.    Cardiovascular: Negative.   Gastrointestinal: Negative.   Musculoskeletal:  Positive for arthralgias, myalgias, neck pain and neck stiffness. Negative for back pain, gait problem and joint swelling.  Skin: Negative.   Neurological: Negative.   Psychiatric/Behavioral: Negative.      Per HPI unless specifically indicated above  Objective:    BP 122/82   Pulse 94   Temp 98.2 F (36.8 C) (Oral)   Wt 169 lb 12.8 oz (77 kg)   SpO2 98%   BMI 29.15 kg/m   Wt Readings from Last 3 Encounters:  01/17/23 169 lb 12.8 oz (77 kg)  12/30/22 168 lb 6.4 oz (76.4 kg)  11/05/22 164 lb (74.4 kg)    Physical Exam Vitals and nursing note reviewed.  Constitutional:      General: She is not in acute distress.    Appearance: Normal appearance. She is not ill-appearing, toxic-appearing or diaphoretic.  HENT:     Head: Normocephalic and atraumatic.     Right Ear: External ear normal.     Left Ear: External ear normal.      Nose: Nose normal.     Mouth/Throat:     Mouth: Mucous membranes are moist.     Pharynx: Oropharynx is clear.  Eyes:     General: No scleral icterus.       Right eye: No discharge.        Left eye: No discharge.     Extraocular Movements: Extraocular movements intact.     Conjunctiva/sclera: Conjunctivae normal.     Pupils: Pupils are equal, round, and reactive to light.  Cardiovascular:     Rate and Rhythm: Normal rate and regular rhythm.     Pulses: Normal pulses.     Heart sounds: Normal heart sounds. No murmur heard.    No friction rub. No gallop.  Pulmonary:     Effort: Pulmonary effort is normal. No respiratory distress.     Breath sounds: Normal breath sounds. No stridor. No wheezing, rhonchi or rales.  Chest:     Chest wall: No tenderness.  Musculoskeletal:        General: Normal range of motion.     Cervical back: Normal range of motion and neck supple.  Skin:    General: Skin is warm and dry.     Capillary Refill: Capillary refill takes less than 2 seconds.     Coloration: Skin is not jaundiced or pale.     Findings: No bruising, erythema, lesion or rash.  Neurological:     General: No focal deficit present.     Mental Status: She is alert and oriented to person, place, and time. Mental status is at baseline.  Psychiatric:        Mood and Affect: Mood normal.        Behavior: Behavior normal.        Thought Content: Thought content normal.        Judgment: Judgment normal.     Results for orders placed or performed in visit on 11/08/22  CUP PACEART REMOTE DEVICE CHECK  Result Value Ref Range   Date Time Interrogation Session [id number]    Pulse Generator Manufacturer MERM    Pulse Gen Model A2DR01 Advisa DR MRI    Pulse Gen Serial Number [id number]    Clinic Name [facility]    Implantable Pulse Generator Type Implantable Pulse Generator    Implantable Pulse Generator Implant Date [id number]    Implantable Lead Manufacturer [facility]    Implantable Lead  Model 5076 CapSureFix Novus    Implantable Lead Serial Number [id number]    Implantable Lead Implant Date [id number]    Implantable Lead Location Detail 1 APPENDAGE    Implantable Lead Location [id number]    Implantable Lead Connection Status [id number]    Implantable Lead Manufacturer MERM  Implantable Lead Model 5076 CapSureFix Novus    Implantable Lead Serial Number [id number]    Implantable Lead Implant Date [id number]    Implantable Lead Location Detail 1 APEX    Implantable Lead Location [id number]    Implantable Lead Connection Status [id number]    Lead Channel Setting Sensing Sensitivity 2 mV   Lead Channel Setting Pacing Amplitude 2 V   Lead Channel Setting Pacing Pulse Width 0.4 ms   Lead Channel Setting Pacing Amplitude 2.5 V   Zone Setting Status 755011    Zone Setting Status [phone]    Lead Channel Impedance Value 456 ohm   Lead Channel Impedance Value 285 ohm   Lead Channel Sensing Intrinsic Amplitude 1.875 mV   Lead Channel Sensing Intrinsic Amplitude 1.875 mV   Lead Channel Pacing Threshold Amplitude 1 V   Lead Channel Pacing Threshold Pulse Width 0.4 ms   Lead Channel Impedance Value 456 ohm   Lead Channel Impedance Value 399 ohm   Lead Channel Sensing Intrinsic Amplitude 5.125 mV   Lead Channel Sensing Intrinsic Amplitude 5 mV   Lead Channel Pacing Threshold Amplitude 0.625 V   Lead Channel Pacing Threshold Pulse Width 0.4 ms   Battery Status OK    Battery Remaining Longevity 11 mo   Battery Voltage 2.89 V   Brady Statistic RA Percent Paced 0.06 %   Brady Statistic RV Percent Paced 100 %   Brady Statistic AP VP Percent 0.06 %   Brady Statistic AS VP Percent 99.94 %   Brady Statistic AP VS Percent 0 %   Brady Statistic AS VS Percent 0 %      Assessment & Plan:   Problem List Items Addressed This Visit       Cardiovascular and Mediastinum   Hypertension - Primary    Continue to follow with cardiology- they will adjust doses as needed. Call with any concerns.          Other   Depression, recurrent (HCC)    Under good control on current regimen. Continue current regimen. Continue to monitor. Call with any concerns. Refills given.        Relevant Medications   LORazepam (ATIVAN) 0.5 MG tablet   venlafaxine XR (EFFEXOR-XR) 150 MG 24 hr capsule     Follow up plan: Return in about 7 months (around 08/11/2023) for Physical/wellness with me.

## 2023-01-20 ENCOUNTER — Other Ambulatory Visit: Payer: Self-pay | Admitting: Cardiology

## 2023-01-20 DIAGNOSIS — M7061 Trochanteric bursitis, right hip: Secondary | ICD-10-CM | POA: Diagnosis not present

## 2023-01-20 DIAGNOSIS — R030 Elevated blood-pressure reading, without diagnosis of hypertension: Secondary | ICD-10-CM

## 2023-01-20 DIAGNOSIS — I471 Supraventricular tachycardia, unspecified: Secondary | ICD-10-CM

## 2023-01-20 DIAGNOSIS — Z6828 Body mass index (BMI) 28.0-28.9, adult: Secondary | ICD-10-CM | POA: Diagnosis not present

## 2023-01-20 DIAGNOSIS — Z950 Presence of cardiac pacemaker: Secondary | ICD-10-CM

## 2023-01-20 DIAGNOSIS — M4802 Spinal stenosis, cervical region: Secondary | ICD-10-CM | POA: Diagnosis not present

## 2023-01-24 ENCOUNTER — Other Ambulatory Visit: Payer: Self-pay | Admitting: Medical Genetics

## 2023-01-24 DIAGNOSIS — Z006 Encounter for examination for normal comparison and control in clinical research program: Secondary | ICD-10-CM

## 2023-01-29 ENCOUNTER — Ambulatory Visit: Payer: Medicare PPO

## 2023-01-29 DIAGNOSIS — I471 Supraventricular tachycardia, unspecified: Secondary | ICD-10-CM

## 2023-01-29 DIAGNOSIS — R030 Elevated blood-pressure reading, without diagnosis of hypertension: Secondary | ICD-10-CM | POA: Diagnosis not present

## 2023-01-29 DIAGNOSIS — I088 Other rheumatic multiple valve diseases: Secondary | ICD-10-CM | POA: Diagnosis not present

## 2023-01-29 DIAGNOSIS — Z950 Presence of cardiac pacemaker: Secondary | ICD-10-CM

## 2023-01-29 LAB — ECHOCARDIOGRAM COMPLETE: S' Lateral: 3.3 cm

## 2023-02-07 ENCOUNTER — Ambulatory Visit (INDEPENDENT_AMBULATORY_CARE_PROVIDER_SITE_OTHER): Payer: Medicare PPO

## 2023-02-07 DIAGNOSIS — I442 Atrioventricular block, complete: Secondary | ICD-10-CM

## 2023-02-07 LAB — CUP PACEART REMOTE DEVICE CHECK
Battery Remaining Longevity: 8 mo
Battery Voltage: 2.87 V
Brady Statistic AP VP Percent: 2.11 %
Brady Statistic AP VS Percent: 0 %
Brady Statistic AS VP Percent: 97.88 %
Brady Statistic AS VS Percent: 0.01 %
Brady Statistic RA Percent Paced: 2.11 %
Brady Statistic RV Percent Paced: 99.98 %
Date Time Interrogation Session: 20240906082423
Implantable Lead Connection Status: 753985
Implantable Lead Connection Status: 753985
Implantable Lead Implant Date: 20070319
Implantable Lead Implant Date: 20070319
Implantable Lead Location: 753859
Implantable Lead Location: 753860
Implantable Lead Model: 5076
Implantable Lead Model: 5076
Implantable Pulse Generator Implant Date: 20160210
Lead Channel Impedance Value: 285 Ohm
Lead Channel Impedance Value: 399 Ohm
Lead Channel Impedance Value: 456 Ohm
Lead Channel Impedance Value: 475 Ohm
Lead Channel Pacing Threshold Amplitude: 0.75 V
Lead Channel Pacing Threshold Amplitude: 1 V
Lead Channel Pacing Threshold Pulse Width: 0.4 ms
Lead Channel Pacing Threshold Pulse Width: 0.4 ms
Lead Channel Sensing Intrinsic Amplitude: 3 mV
Lead Channel Sensing Intrinsic Amplitude: 3 mV
Lead Channel Sensing Intrinsic Amplitude: 5.75 mV
Lead Channel Sensing Intrinsic Amplitude: 5.75 mV
Lead Channel Setting Pacing Amplitude: 2 V
Lead Channel Setting Pacing Amplitude: 2.5 V
Lead Channel Setting Pacing Pulse Width: 0.4 ms
Lead Channel Setting Sensing Sensitivity: 2 mV
Zone Setting Status: 755011
Zone Setting Status: 755011

## 2023-02-11 NOTE — Progress Notes (Signed)
Remote pacemaker transmission.   

## 2023-02-12 ENCOUNTER — Encounter: Payer: Self-pay | Admitting: Cardiology

## 2023-02-12 ENCOUNTER — Ambulatory Visit: Payer: Medicare PPO | Attending: Cardiology | Admitting: Cardiology

## 2023-02-12 VITALS — BP 152/100 | HR 87 | Ht 64.0 in | Wt 168.0 lb

## 2023-02-12 DIAGNOSIS — I10 Essential (primary) hypertension: Secondary | ICD-10-CM

## 2023-02-12 DIAGNOSIS — I471 Supraventricular tachycardia, unspecified: Secondary | ICD-10-CM

## 2023-02-12 DIAGNOSIS — Z950 Presence of cardiac pacemaker: Secondary | ICD-10-CM

## 2023-02-12 MED ORDER — LOSARTAN POTASSIUM 25 MG PO TABS: 25.0000 mg | ORAL_TABLET | Freq: Every day | ORAL | 3 refills | Status: DC

## 2023-02-12 NOTE — Progress Notes (Signed)
Cardiology Office Note:    Date:  02/12/2023   ID:  Angela [name], DOB 1970-06-05, MRN [id number]  PCP:  Dorcas Carrow, DO   [facility] Providers Cardiologist:  Debbe Odea, MD     Referring MD: Dorcas Carrow, DO   Chief Complaint  Patient presents with   Follow-up    Follow up post echo. Patient c/o of fatigue, SOB, elevated BP making Right ear fill full. Meds reviewed verbally with patient.     History of Present Illness:    Angela [name] is a 52 y.o. female with a hx of SVT s/p ablation complicated by AV node ablation requiring pacemaker 2007, Raynaud's, dysautonomia presenting for follow-up.    Last seen with elevated BP, echocardiogram also obtained due to history of SVT.  Blood pressures have been elevated at home, associated with ear fullness.  Otherwise doing okay.  States pacemaker is nearing end-of-life, wondering if he could have leadless pacemaker.  Has appointment with EP next month.   Prior notes/tests Coronary calcium score 10/2022 normal at 0 Echo 03/2020 EF 55 to 60% Cardiac cath 2010 normal coronary arteries Has a history of dysautonomia, with symptoms of dizziness, currently adequately controlled, has not had any symptoms over the past 7 years as of 12/2022.  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 EF 50-55% normal coronaries   Complete heart block (HCC)    Depression    Dysautonomia (HCC)    Dyspnea    CT negative for PE, 2007   Hypertension    Insomnia    OCD (obsessive compulsive disorder)    SVT (supraventricular tachycardia)    s/p ablation a. c/b AV nod ablation requiring pacemaker    Past Surgical History:  Procedure Laterality Date   ANTERIOR CERVICAL DECOMP/DISCECTOMY FUSION N/A 09/20/2021   Procedure: Anterior Cervical Discectomy Fusion  Cervical Four-Five, Cervical Five-Six, Cervical  Six-Seven;  Surgeon: Dawley, Alan Mulder, DO;  Location: [facility] OR;  Service: Neurosurgery;  Laterality: N/A;  3C   BREAST BIOPSY Right 1998   BREAST BIOPSY Right 2004   BREAST LUMPECTOMY     RIGHT -BREAST    COLONOSCOPY WITH PROPOFOL N/A 09/07/2019   Procedure: COLONOSCOPY WITH PROPOFOL;  Surgeon: Midge Minium, MD;  Location: [facility] END[facility]COPY;  Service: Endoscopy;  Laterality: N/A;   ENDOMETRIAL ABLATION  2005   INSERT / REPLACE / REMOVE PACEMAKER  2007   PELVIC LAPAROSCOPY  1990   PERMANENT PACEMAKER GENERATOR CHANGE N/A 07/13/2014   MDT MRI compatible dual chamber pacemaker implanted by Dr Graciela Husbands   POSTERIOR CERVICAL FUSION/FORAMINOTOMY N/A 11/05/2022   Procedure: POSTERIOR CERVICAL INSTRUMENTATION AND FUSION CERVICAL SIX-THORACIC ONE;  Surgeon: Bethann Goo, DO;  Location: [facility] OR;  Service: Neurosurgery;  Laterality: N/A;  3C   SPARK  2005   TUBAL LIGATION  2003    Current Medications: Current Meds  Medication Sig   celecoxib (CELEBREX) 100 MG capsule Take 100 mg by mouth 2 (two) times daily.   diltiazem (CARDIZEM CD) 120 MG 24 hr capsule TAKE 1 CAPSULE(120 MG) BY MOUTH DAILY   FIBER ADULT GUMMIES PO Take 2 each by mouth daily. Vita Fusion Fiberwell   hydroxychloroquine (PLAQUENIL) 200 MG tablet Take 2 tablets (400 mg total) by mouth daily.   LORazepam (ATIVAN) 0.5 MG tablet TAKE 1 TO 2 TABLETS(0.5 TO 1  MG) BY MOUTH DAILY AS NEEDED FOR ANXIETY   losartan (COZAAR) 25 MG tablet Take 1 tablet (25 mg total) by mouth daily.   methocarbamol (ROBAXIN) 500 MG tablet Take 1 tablet (500 mg total) by mouth every 6 (six) hours as needed for muscle spasms.   Multiple Vitamins-Minerals (MULTIVITAMIN WITH MINERALS) tablet Take 1 tablet by mouth daily.   oxyCODONE 10 MG TABS Take 1 tablet (10 mg total) by mouth every 4 (four) hours as needed for severe pain ((score 7 to 10)).   traMADol (ULTRAM) 50 MG tablet Take 50 mg by mouth every 6 (six) hours as needed for moderate pain.   venlafaxine XR (EFFEXOR-XR) 150  MG 24 hr capsule TAKE 1 CAPSULE(150 MG) BY MOUTH DAILY WITH BREAKFAST     Allergies:   Toprol xl [metoprolol succinate]   Social History   Socioeconomic History   Marital status: Married    Spouse name: Darin   Number of children: 2   Years of education: Not on file   Highest education level: Bachelor's degree (e.g., BA, AB, BS)  Occupational History   Occupation: disability   Tobacco Use   Smoking status: Never   Smokeless tobacco: Never  Vaping Use   Vaping status: Never Used  Substance and Sexual Activity   Alcohol use: No   Drug use: No   Sexual activity: Yes    Birth control/protection: Surgical    Comment: Uterine ablarion 2002  Other Topics Concern   Not on file  Social History Narrative   Work with daughters school    Social Determinants of Health   Financial Resource Strain: Low Risk  (07/31/2021)   Overall Financial Resource Strain (CARDIA)    Difficulty of Paying Living Expenses: Not hard at all  Food Insecurity: No Food Insecurity (07/31/2021)   Hunger Vital Sign    Worried About Running Out of Food in the Last Year: Never true    Ran Out of Food in the Last Year: Never true  Transportation Needs: No Transportation Needs (07/31/2021)   PRAPARE - Administrator, Civil Service (Medical): No    Lack of Transportation (Non-Medical): No  Physical Activity: Insufficiently Active (07/31/2021)   Exercise Vital Sign    Days of Exercise per Week: 2 days    Minutes of Exercise per Session: 20 min  Stress: No Stress Concern Present (07/31/2021)   [employer] of Occupational Health - Occupational Stress Questionnaire    Feeling of Stress : Only a little  Social Connections: Socially Integrated (07/31/2021)   Social Connection and Isolation Panel [NHANES]    Frequency of Communication with Friends and Family: Three times a week    Frequency of Social Gatherings with Friends and Family: Three times a week    Attends Religious Services: More than 4 times  per year    Active Member of Clubs or Organizations: Yes    Attends Engineer, structural: More than 4 times per year    Marital Status: Married     Family History: The patient's family history includes Anxiety disorder in her mother and sister; Arthritis in her mother and paternal grandmother; Breast cancer in her maternal aunt, paternal aunt, paternal aunt, and paternal grandmother; Cancer in her maternal aunt, paternal aunt, paternal grandfather, and paternal grandmother; Cancer (age of onset: 42) in her father; Cervical cancer in her mother; Dementia in her maternal grandmother; Diabetes in her father, paternal grandfather, and paternal grandmother; Hypertension in her father and maternal grandfather; Obesity  in her mother.  ROS:   Please see the history of present illness.     All other systems reviewed and are negative.  EKGs/Labs/Other Studies Reviewed:    The following studies were reviewed today:       Recent Labs: 08/08/2022: ALT 22; TSH 1.110 10/29/2022: BUN 11; Creatinine, Ser 0.82; Hemoglobin 15.0; Platelets 255; Potassium 4.1; Sodium 136  Recent Lipid Panel    Component Value Date/Time   CHOL 196 08/08/2022 1038   TRIG 106 08/08/2022 1038   HDL 75 08/08/2022 1038   CHOLHDL 3.0 03/19/2019 1337   LDLCALC 102 (H) 08/08/2022 1038     Risk Assessment/Calculations:         Physical Exam:    VS:  BP (!) 152/100 (BP Location: Left Arm, Patient Position: Sitting, Cuff Size: Normal)   Pulse 87   Ht 5\' 4"  (1.626 m)   Wt 168 lb (76.2 kg)   SpO2 98%   BMI 28.84 kg/m     Wt Readings from Last 3 Encounters:  02/12/23 168 lb (76.2 kg)  01/17/23 169 lb 12.8 oz (77 kg)  12/30/22 168 lb 6.4 oz (76.4 kg)     GEN:  Well nourished, well developed in no acute distress HEENT: Normal NECK: No JVD; No carotid bruits CARDIAC: RRR, no murmurs, rubs, gallops RESPIRATORY:  Clear to auscultation without rales, wheezing or rhonchi  ABDOMEN: Soft, non-tender,  non-distended MUSCULOSKELETAL:  No edema; No deformity  SKIN: Warm and dry NEUROLOGIC:  Alert and oriented x 3 PSYCHIATRIC:  Normal affect   ASSESSMENT:    1. Primary hypertension   2. SVT (supraventricular tachycardia)   3. Pacemaker    PLAN:    In order of problems listed above:  Hypertension, BP elevated.  Start losartan 25 mg daily, continue Cardizem CD 120 mg daily. History of SVT s/p ablation 2007, continue Cardizem CD 120 mg daily.  Echo 8/24 EF 55 to 60% Complete heart block, complication from SVT ablation s/p pacemaker, monitoring as per device clinic.  Keep appointment with EP next month.  Follow-up in 3 months     Medication Adjustments/Labs and Tests Ordered: Current medicines are reviewed at length with the patient today.  Concerns regarding medicines are outlined above.  No orders of the defined types were placed in this encounter.  Meds ordered this encounter  Medications   losartan (COZAAR) 25 MG tablet    Sig: Take 1 tablet (25 mg total) by mouth daily.    Dispense:  90 tablet    Refill:  3    Patient Instructions  Medication Instructions:   START Losartan - Take one tablet ( 25mg ) by mouth daily.   *If you need a refill on your cardiac medications before your next appointment, please call your pharmacy*   Lab Work:  None Ordered  If you have labs (blood work) drawn today and your tests are completely normal, you will receive your results only by: MyChart Message (if you have MyChart) OR A paper copy in the mail If you have any lab test that is abnormal or we need to change your treatment, we will call you to review the results.   Testing/Procedures:  None Ordered   Follow-Up: At [facility], you and your health needs are our priority.  As part of our continuing mission to provide you with exceptional heart care, we have created designated Provider Care Teams.  These Care Teams include your primary Cardiologist (physician) and  Advanced Practice Providers (APPs -  Physician Assistants and Nurse Practitioners) who all work together to provide you with the care you need, when you need it.  We recommend signing up for the patient portal called "MyChart".  Sign up information is provided on this After Visit Summary.  MyChart is used to connect with patients for Virtual Visits (Telemedicine).  Patients are able to view lab/test results, encounter notes, upcoming appointments, etc.  Non-urgent messages can be sent to your provider as well.   To learn more about what you can do with MyChart, go to [email or web address].    Your next appointment:   3 month(s)  Provider:   You may see Debbe Odea, MD or one of the following Advanced Practice Providers on your designated Care Team:   Nicolasa Ducking, NP Eula Listen, PA-C Cadence Fransico Michael, PA-C Charlsie Quest, NP   Signed, Debbe Odea, MD  02/12/2023 11:57 AM    [facility]

## 2023-02-12 NOTE — Patient Instructions (Signed)
Medication Instructions:   START Losartan - Take one tablet ( 25mg ) by mouth daily.   *If you need a refill on your cardiac medications before your next appointment, please call your pharmacy*   Lab Work:  None Ordered  If you have labs (blood work) drawn today and your tests are completely normal, you will receive your results only by: MyChart Message (if you have MyChart) OR A paper copy in the mail If you have any lab test that is abnormal or we need to change your treatment, we will call you to review the results.   Testing/Procedures:  None Ordered   Follow-Up: At [facility], you and your health needs are our priority.  As part of our continuing mission to provide you with exceptional heart care, we have created designated Provider Care Teams.  These Care Teams include your primary Cardiologist (physician) and Advanced Practice Providers (APPs -  Physician Assistants and Nurse Practitioners) who all work together to provide you with the care you need, when you need it.  We recommend signing up for the patient portal called "MyChart".  Sign up information is provided on this After Visit Summary.  MyChart is used to connect with patients for Virtual Visits (Telemedicine).  Patients are able to view lab/test results, encounter notes, upcoming appointments, etc.  Non-urgent messages can be sent to your provider as well.   To learn more about what you can do with MyChart, go to [email or web address].    Your next appointment:   3 month(s)  Provider:   You may see Debbe Odea, MD or one of the following Advanced Practice Providers on your designated Care Team:   Nicolasa Ducking, NP Eula Listen, PA-C Cadence Fransico Michael, PA-C Charlsie Quest, NP

## 2023-02-15 ENCOUNTER — Encounter: Payer: Self-pay | Admitting: Internal Medicine

## 2023-02-17 ENCOUNTER — Other Ambulatory Visit: Payer: Self-pay | Admitting: Internal Medicine

## 2023-02-17 MED ORDER — DILTIAZEM HCL ER COATED BEADS 120 MG PO CP24: 120.0000 mg | ORAL_CAPSULE | Freq: Every day | ORAL | 3 refills | Status: DC

## 2023-02-17 NOTE — Addendum Note (Signed)
Addended by: Alois Cliche on: 02/17/2023 02:19 PM   Modules accepted: Orders

## 2023-02-24 DIAGNOSIS — H11042 Peripheral pterygium, stationary, left eye: Secondary | ICD-10-CM | POA: Diagnosis not present

## 2023-02-24 DIAGNOSIS — M0560 Rheumatoid arthritis of unspecified site with involvement of other organs and systems: Secondary | ICD-10-CM | POA: Diagnosis not present

## 2023-02-24 DIAGNOSIS — Z79899 Other long term (current) drug therapy: Secondary | ICD-10-CM | POA: Diagnosis not present

## 2023-02-24 DIAGNOSIS — H5213 Myopia, bilateral: Secondary | ICD-10-CM | POA: Diagnosis not present

## 2023-03-04 DIAGNOSIS — Z79899 Other long term (current) drug therapy: Secondary | ICD-10-CM | POA: Diagnosis not present

## 2023-03-04 DIAGNOSIS — M79643 Pain in unspecified hand: Secondary | ICD-10-CM | POA: Diagnosis not present

## 2023-03-04 DIAGNOSIS — M25551 Pain in right hip: Secondary | ICD-10-CM | POA: Diagnosis not present

## 2023-03-04 DIAGNOSIS — M2550 Pain in unspecified joint: Secondary | ICD-10-CM | POA: Diagnosis not present

## 2023-03-04 DIAGNOSIS — M7060 Trochanteric bursitis, unspecified hip: Secondary | ICD-10-CM | POA: Diagnosis not present

## 2023-03-04 DIAGNOSIS — M1990 Unspecified osteoarthritis, unspecified site: Secondary | ICD-10-CM | POA: Diagnosis not present

## 2023-03-19 DIAGNOSIS — M25551 Pain in right hip: Secondary | ICD-10-CM | POA: Diagnosis not present

## 2023-03-25 ENCOUNTER — Ambulatory Visit: Payer: Medicare PPO | Attending: Internal Medicine | Admitting: Internal Medicine

## 2023-03-25 VITALS — BP 152/101 | HR 90

## 2023-03-25 DIAGNOSIS — I442 Atrioventricular block, complete: Secondary | ICD-10-CM

## 2023-03-25 DIAGNOSIS — I4719 Other supraventricular tachycardia: Secondary | ICD-10-CM

## 2023-03-25 DIAGNOSIS — Z950 Presence of cardiac pacemaker: Secondary | ICD-10-CM | POA: Diagnosis not present

## 2023-03-25 DIAGNOSIS — G90A Postural orthostatic tachycardia syndrome (POTS): Secondary | ICD-10-CM

## 2023-03-25 MED ORDER — LOSARTAN POTASSIUM-HCTZ 50-12.5 MG PO TABS: 1.0000 | ORAL_TABLET | Freq: Every day | ORAL | 1 refills | Status: DC

## 2023-03-25 NOTE — Progress Notes (Signed)
Patient Care Team: Angela Carrow, DO as PCP - General (Family Medicine) Debbe Odea, MD as PCP - Cardiology (Cardiology)   HPI  Angela [name] is a 52 y.o. 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   Because of some degree of atrial tachycardia/sinus tachycardia we have tried reprogrammed her device DDD--DDI. She did not tolerate this at all. We subsequently programmed her back with a lower upper tracking rate   Arthritis has been a problem.  On Celebrex and Plaquenil the latter with improvement. Because of this has gained the weight back.  Continues to struggle with some dyspnea.  Blood pressures also become increasingly problematic.  Now seeing Dr. Corrin [name] in [place].  Mental health is much improved.  Son has graduated from [employer] Daughter has graduated from [employer]   DATE TEST EF   10/21 Echo  55-60%         Date Cr K Hgb  8/23 0.83 4.3 14.9           Past Medical History:  Diagnosis Date   Arthritis 2016   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 EF 50-55% normal coronaries   Complete heart block (HCC)    Depression    Dysautonomia (HCC)    Dyspnea    CT negative for PE, 2007   Hypertension    Insomnia    OCD (obsessive compulsive disorder)    SVT (supraventricular tachycardia)    s/p ablation a. c/b AV nod ablation requiring pacemaker    Past Surgical History:  Procedure Laterality Date   ANTERIOR CERVICAL DECOMP/DISCECTOMY FUSION N/A 09/20/2021   Procedure: Anterior Cervical Discectomy Fusion  Cervical Four-Five, Cervical Five-Six, Cervical Six-Seven;  Surgeon: Dawley, Alan Mulder, DO;  Location: [facility] OR;  Service: Neurosurgery;  Laterality: N/A;  3C   BREAST BIOPSY Right 1998   BREAST BIOPSY Right 2004    BREAST LUMPECTOMY     RIGHT -BREAST    COLONOSCOPY WITH PROPOFOL N/A 09/07/2019   Procedure: COLONOSCOPY WITH PROPOFOL;  Surgeon: Midge Minium, MD;  Location: [facility] ENDOSCOPY;  Service: Endoscopy;  Laterality: N/A;   ENDOMETRIAL ABLATION  2005   INSERT / REPLACE / REMOVE PACEMAKER  2007   PELVIC LAPAROSCOPY  1990   PERMANENT PACEMAKER GENERATOR CHANGE N/A 07/13/2014   MDT MRI compatible dual chamber pacemaker implanted by Dr Graciela Husbands   POSTERIOR CERVICAL FUSION/FORAMINOTOMY N/A 11/05/2022   Procedure: POSTERIOR CERVICAL INSTRUMENTATION AND FUSION CERVICAL SIX-THORACIC ONE;  Surgeon: Bethann Goo, DO;  Location: [facility] OR;  Service: Neurosurgery;  Laterality: N/A;  3C   SPARK  2005   TUBAL LIGATION  2003    Current Outpatient Medications  Medication Sig Dispense Refill   celecoxib (CELEBREX) 100 MG capsule Take 100 mg by mouth 2 (two) times daily.     diltiazem (CARDIZEM CD) 120 MG 24 hr capsule Take 1 capsule (120 mg total) by mouth daily. 90 capsule 3   FIBER ADULT GUMMIES PO Take 2 each by mouth daily. Vita Fusion Fiberwell     hydroxychloroquine (PLAQUENIL) 200 MG tablet Take 2 tablets (400 mg total) by mouth daily.     LORazepam (ATIVAN) 0.5 MG tablet TAKE 1 TO 2 TABLETS(0.5 TO 1 MG) BY  MOUTH DAILY AS NEEDED FOR ANXIETY 45 tablet 1   losartan (COZAAR) 25 MG tablet Take 1 tablet (25 mg total) by mouth daily. 90 tablet 3   methocarbamol (ROBAXIN) 500 MG tablet Take 1 tablet (500 mg total) by mouth every 6 (six) hours as needed for muscle spasms. 120 tablet 3   Multiple Vitamins-Minerals (MULTIVITAMIN WITH MINERALS) tablet Take 1 tablet by mouth daily.     traMADol (ULTRAM) 50 MG tablet Take 50 mg by mouth every 6 (six) hours as needed for moderate pain.     venlafaxine XR (EFFEXOR-XR) 150 MG 24 hr capsule TAKE 1 CAPSULE(150 MG) BY MOUTH DAILY WITH BREAKFAST 90 capsule 1   No current facility-administered medications for this visit.    Allergies  Allergen Reactions   Toprol Xl [Metoprolol  Succinate] Photosensitivity    Review of Systems negative except from HPI and PMH BP (!) 152/101 (Patient Position: Standing)   Pulse 90   Well developed and well nourished in no acute distress HENT normal Neck supple with JVP-flat Clear Device pocket well healed; without hematoma or erythema.  There is no tethering  Regular rate and rhythm, no  gallop No  murmur Abd-soft with active BS No Clubbing cyanosis tr edema Skin-warm and dry A & Oriented  Grossly normal sensory and motor function  ECG sinus with P-synchronous/ AV  pacing   Device function is normal. Programming changes none  See Paceart for details    Assessment and  Plan  Complete heart block  Pacemaker Medtronic     Chest pain-exertional-atypical  POTS  Obesity  Anxiety/depression    Raynaud's  Atrial tachycardia/  Hypertension  Ventricular sensed events question mechanism    Chest pain is better on the diltiazem.  Blood pressure is poorly controlled.  Will increase her losartan 25--losartan/HCT 50/12.5.  Will need a metabolic profile in about 3 weeks.  Device approaching ERI about 7 months away.  Will see her again in 3 months.  Arthritis is improved with her Plaquenil.  She is also on Celebrex.  I have encouraged her to follow-up with her primary care doctor about getting her off the Celebrex if possible as this is likely aggravating her hypertension

## 2023-03-25 NOTE — Patient Instructions (Addendum)
Medication Instructions:  Your physician has recommended you make the following change in your medication:   ** Stop Losartan 25mg   ** Begin Losartan-hydrochlorothiazide 50/12.5mg  -1 tablet by mouth daily  *If you need a refill on your cardiac medications before your next appointment, please call your pharmacy*   Lab Work: None ordered.  If you have labs (blood work) drawn today and your tests are completely normal, you will receive your results only by: MyChart Message (if you have MyChart) OR A paper copy in the mail If you have any lab test that is abnormal or we need to change your treatment, we will call you to review the results.   Testing/Procedures: None ordered.    Follow-Up: At [facility], you and your health needs are our priority.  As part of our continuing mission to provide you with exceptional heart care, we have created designated Provider Care Teams.  These Care Teams include your primary Cardiologist (physician) and Advanced Practice Providers (APPs -  Physician Assistants and Nurse Practitioners) who all work together to provide you with the care you need, when you need it.  We recommend signing up for the patient portal called "MyChart".  Sign up information is provided on this After Visit Summary.  MyChart is used to connect with patients for Virtual Visits (Telemedicine).  Patients are able to view lab/test results, encounter notes, upcoming appointments, etc.  Non-urgent messages can be sent to your provider as well.   To learn more about what you can do with MyChart, go to [email or web address].    Your next appointment:   6 months with Dr Odessa Fleming PA

## 2023-03-30 ENCOUNTER — Other Ambulatory Visit: Payer: Medicare PPO

## 2023-03-30 ENCOUNTER — Other Ambulatory Visit: Payer: Self-pay

## 2023-03-31 ENCOUNTER — Other Ambulatory Visit
Admission: RE | Admit: 2023-03-31 | Discharge: 2023-03-31 | Disposition: A | Discharge status: Home / Self Care | Payer: Self-pay | Source: Ambulatory Visit | Attending: Medical Genetics | Admitting: Medical Genetics

## 2023-03-31 DIAGNOSIS — Z006 Encounter for examination for normal comparison and control in clinical research program: Secondary | ICD-10-CM | POA: Insufficient documentation

## 2023-04-08 LAB — HELIX MOLECULAR SCREEN: Genetic Analysis Overall Interpretation: NEGATIVE

## 2023-04-08 LAB — GENECONNECT MOLECULAR SCREEN

## 2023-04-09 DIAGNOSIS — M25551 Pain in right hip: Secondary | ICD-10-CM | POA: Diagnosis not present

## 2023-05-07 DIAGNOSIS — M25551 Pain in right hip: Secondary | ICD-10-CM | POA: Diagnosis not present

## 2023-05-09 ENCOUNTER — Ambulatory Visit (INDEPENDENT_AMBULATORY_CARE_PROVIDER_SITE_OTHER): Payer: Medicare PPO

## 2023-05-09 DIAGNOSIS — I442 Atrioventricular block, complete: Secondary | ICD-10-CM | POA: Diagnosis not present

## 2023-05-11 LAB — CUP PACEART REMOTE DEVICE CHECK
Battery Remaining Longevity: 5 mo
Battery Voltage: 2.86 V
Brady Statistic AP VP Percent: 2.77 %
Brady Statistic AP VS Percent: 0 %
Brady Statistic AS VP Percent: 97.22 %
Brady Statistic AS VS Percent: 0.01 %
Brady Statistic RA Percent Paced: 2.77 %
Brady Statistic RV Percent Paced: 99.99 %
Date Time Interrogation Session: 20241206101649
Implantable Lead Connection Status: 753985
Implantable Lead Connection Status: 753985
Implantable Lead Implant Date: 20070319
Implantable Lead Implant Date: 20070319
Implantable Lead Location: 753859
Implantable Lead Location: 753860
Implantable Lead Model: 5076
Implantable Lead Model: 5076
Implantable Pulse Generator Implant Date: 20160210
Lead Channel Impedance Value: 304 Ohm
Lead Channel Impedance Value: 418 Ohm
Lead Channel Impedance Value: 475 Ohm
Lead Channel Impedance Value: 475 Ohm
Lead Channel Pacing Threshold Amplitude: 0.75 V
Lead Channel Pacing Threshold Amplitude: 1.125 V
Lead Channel Pacing Threshold Pulse Width: 0.4 ms
Lead Channel Pacing Threshold Pulse Width: 0.4 ms
Lead Channel Sensing Intrinsic Amplitude: 2.5 mV
Lead Channel Sensing Intrinsic Amplitude: 2.5 mV
Lead Channel Sensing Intrinsic Amplitude: 6.25 mV
Lead Channel Sensing Intrinsic Amplitude: 6.25 mV
Lead Channel Setting Pacing Amplitude: 2.5 V
Lead Channel Setting Pacing Amplitude: 2.5 V
Lead Channel Setting Pacing Pulse Width: 0.4 ms
Lead Channel Setting Sensing Sensitivity: 2 mV
Zone Setting Status: 755011
Zone Setting Status: 755011

## 2023-05-12 ENCOUNTER — Other Ambulatory Visit: Payer: Self-pay | Admitting: Orthopedic Surgery

## 2023-05-12 DIAGNOSIS — M25551 Pain in right hip: Secondary | ICD-10-CM

## 2023-05-14 ENCOUNTER — Ambulatory Visit: Payer: Medicare PPO | Attending: Cardiology | Admitting: Cardiology

## 2023-05-14 ENCOUNTER — Encounter: Payer: Self-pay | Admitting: Cardiology

## 2023-05-14 VITALS — BP 122/78 | HR 86 | Ht 64.0 in | Wt 177.4 lb

## 2023-05-14 DIAGNOSIS — Z950 Presence of cardiac pacemaker: Secondary | ICD-10-CM | POA: Diagnosis not present

## 2023-05-14 DIAGNOSIS — I10 Essential (primary) hypertension: Secondary | ICD-10-CM

## 2023-05-14 DIAGNOSIS — I471 Supraventricular tachycardia, unspecified: Secondary | ICD-10-CM | POA: Diagnosis not present

## 2023-05-14 NOTE — Patient Instructions (Signed)

## 2023-05-14 NOTE — Progress Notes (Signed)
Cardiology Office Note:    Date:  05/14/2023   ID:  Angela [name], DOB Mar 07, 1971, MRN [id number]  PCP:  Dorcas Carrow, DO   [facility] Providers Cardiologist:  Debbe Odea, MD     Referring MD: Dorcas Carrow, DO   Chief Complaint  Patient presents with   Follow-up    Patient denies new or acute cardiac problems/concerns today.      History of Present Illness:    Angela [name] is a 52 y.o. female with a hx of SVT s/p ablation complicated by AV node ablation requiring pacemaker 2007, Raynaud's, dysautonomia presenting for follow-up.    BP was previously elevated, started on losartan which was eventually titrated at EP visit to 50 mg daily.  Blood pressures have been controlled, she is tolerating medicines with no adverse effects.  Denies palpitations.  Pacemaker being monitored closely at pacemaker clinic, battery is nearing end-of-life about 5 months.   Prior notes/tests Coronary calcium score 10/2022 normal at 0 Echo 03/2020 EF 55 to 60% Cardiac cath 2010 normal coronary arteries Has a history of dysautonomia, with symptoms of dizziness, currently adequately controlled, has not had any symptoms over the past 7 years as of 12/2022.  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 EF 50-55% normal coronaries   Complete heart block (HCC)    Depression    Dysautonomia (HCC)    Dyspnea    CT negative for PE, 2007   Hypertension    Insomnia    OCD (obsessive compulsive disorder)    SVT (supraventricular tachycardia) (HCC)    s/p ablation a. c/b AV nod ablation requiring pacemaker    Past Surgical History:  Procedure Laterality Date   ANTERIOR CERVICAL DECOMP/DISCECTOMY FUSION N/A 09/20/2021   Procedure: Anterior Cervical Discectomy Fusion  Cervical Four-Five, Cervical Five-Six, Cervical Six-Seven;  Surgeon: Dawley, Alan Mulder, DO;   Location: [facility] OR;  Service: Neurosurgery;  Laterality: N/A;  3C   BREAST BIOPSY Right 1998   BREAST BIOPSY Right 2004   BREAST LUMPECTOMY     RIGHT -BREAST    COLONOSCOPY WITH PROPOFOL N/A 09/07/2019   Procedure: COLONOSCOPY WITH PROPOFOL;  Surgeon: Midge Minium, MD;  Location: [facility] ENDOSCOPY;  Service: Endoscopy;  Laterality: N/A;   ENDOMETRIAL ABLATION  2005   INSERT / REPLACE / REMOVE PACEMAKER  2007   PELVIC LAPAROSCOPY  1990   PERMANENT PACEMAKER GENERATOR CHANGE N/A 07/13/2014   MDT MRI compatible dual chamber pacemaker implanted by Dr Graciela Husbands   POSTERIOR CERVICAL FUSION/FORAMINOTOMY N/A 11/05/2022   Procedure: POSTERIOR CERVICAL INSTRUMENTATION AND FUSION CERVICAL SIX-THORACIC ONE;  Surgeon: Bethann Goo, DO;  Location: [facility] OR;  Service: Neurosurgery;  Laterality: N/A;  3C   SPARK  2005   TUBAL LIGATION  2003    Current Medications: Current Meds  Medication Sig   celecoxib (CELEBREX) 100 MG capsule Take 100 mg by mouth 2 (two) times daily.   diltiazem (CARDIZEM CD) 120 MG 24 hr capsule Take 1 capsule (120 mg total) by mouth daily.   FIBER ADULT GUMMIES PO Take 2 each by mouth daily. Vita Fusion Fiberwell   hydroxychloroquine (PLAQUENIL) 200 MG tablet Take 2 tablets (400 mg total) by mouth daily.   LORazepam (ATIVAN) 0.5 MG tablet TAKE 1 TO 2 TABLETS(0.5 TO 1 MG) BY MOUTH DAILY AS NEEDED FOR ANXIETY  losartan-hydrochlorothiazide (HYZAAR) 50-12.5 MG tablet Take 1 tablet by mouth daily.   methocarbamol (ROBAXIN) 500 MG tablet Take 1 tablet (500 mg total) by mouth every 6 (six) hours as needed for muscle spasms.   Multiple Vitamins-Minerals (MULTIVITAMIN WITH MINERALS) tablet Take 1 tablet by mouth daily.   traMADol (ULTRAM) 50 MG tablet Take 50 mg by mouth every 6 (six) hours as needed for moderate pain.   venlafaxine XR (EFFEXOR-XR) 150 MG 24 hr capsule TAKE 1 CAPSULE(150 MG) BY MOUTH DAILY WITH BREAKFAST     Allergies:   Toprol xl [metoprolol succinate]   Social History    Socioeconomic History   Marital status: Married    Spouse name: Darin   Number of children: 2   Years of education: Not on file   Highest education level: Bachelor's degree (e.g., BA, AB, BS)  Occupational History   Occupation: disability   Tobacco Use   Smoking status: Never   Smokeless tobacco: Never  Vaping Use   Vaping status: Never Used  Substance and Sexual Activity   Alcohol use: No   Drug use: No   Sexual activity: Yes    Birth control/protection: Surgical    Comment: Uterine ablarion 2002  Other Topics Concern   Not on file  Social History Narrative   Work with daughters school    Social Determinants of Health   Financial Resource Strain: Low Risk  (07/31/2021)   Overall Financial Resource Strain (CARDIA)    Difficulty of Paying Living Expenses: Not hard at all  Food Insecurity: No Food Insecurity (07/31/2021)   Hunger Vital Sign    Worried About Running Out of Food in the Last Year: Never true    Ran Out of Food in the Last Year: Never true  Transportation Needs: No Transportation Needs (07/31/2021)   PRAPARE - Administrator, Civil Service (Medical): No    Lack of Transportation (Non-Medical): No  Physical Activity: Insufficiently Active (07/31/2021)   Exercise Vital Sign    Days of Exercise per Week: 2 days    Minutes of Exercise per Session: 20 min  Stress: No Stress Concern Present (07/31/2021)   [employer] of Occupational Health - Occupational Stress Questionnaire    Feeling of Stress : Only a little  Social Connections: Socially Integrated (07/31/2021)   Social Connection and Isolation Panel [NHANES]    Frequency of Communication with Friends and Family: Three times a week    Frequency of Social Gatherings with Friends and Family: Three times a week    Attends Religious Services: More than 4 times per year    Active Member of Clubs or Organizations: Yes    Attends Engineer, structural: More than 4 times per year    Marital  Status: Married     Family History: The patient's family history includes Anxiety disorder in her mother and sister; Arthritis in her mother and paternal grandmother; Breast cancer in her maternal aunt, paternal aunt, paternal aunt, and paternal grandmother; Cancer in her maternal aunt, paternal aunt, paternal grandfather, and paternal grandmother; Cancer (age of onset: 77) in her father; Cervical cancer in her mother; Dementia in her maternal grandmother; Diabetes in her father, paternal grandfather, and paternal grandmother; Hypertension in her father and maternal grandfather; Obesity in her mother.  ROS:   Please see the history of present illness.     All other systems reviewed and are negative.  EKGs/Labs/Other Studies Reviewed:    The following studies were reviewed today:  Recent Labs: 08/08/2022: ALT 22; TSH 1.110 10/29/2022: BUN 11; Creatinine, Ser 0.82; Hemoglobin 15.0; Platelets 255; Potassium 4.1; Sodium 136  Recent Lipid Panel    Component Value Date/Time   CHOL 196 08/08/2022 1038   TRIG 106 08/08/2022 1038   HDL 75 08/08/2022 1038   CHOLHDL 3.0 03/19/2019 1337   LDLCALC 102 (H) 08/08/2022 1038     Risk Assessment/Calculations:         Physical Exam:    VS:  BP 122/78 (BP Location: Left Arm, Patient Position: Sitting, Cuff Size: Normal)   Pulse 86   Ht 5\' 4"  (1.626 m)   Wt 177 lb 6.4 oz (80.5 kg)   SpO2 98%   BMI 30.45 kg/m     Wt Readings from Last 3 Encounters:  05/14/23 177 lb 6.4 oz (80.5 kg)  02/12/23 168 lb (76.2 kg)  01/17/23 169 lb 12.8 oz (77 kg)     GEN:  Well nourished, well developed in no acute distress HEENT: Normal NECK: No JVD; No carotid bruits CARDIAC: RRR, no murmurs, rubs, gallops RESPIRATORY:  Clear to auscultation without rales, wheezing or rhonchi  ABDOMEN: Soft, non-tender, non-distended MUSCULOSKELETAL:  No edema; No deformity  SKIN: Warm and dry NEUROLOGIC:  Alert and oriented x 3 PSYCHIATRIC:  Normal affect    ASSESSMENT:    1. Primary hypertension   2. SVT (supraventricular tachycardia) (HCC)   3. Pacemaker    PLAN:    In order of problems listed above:  Hypertension, BP controlled.  Continue losartan-HCTZ 50 mg - 12.5 mg daily, Cardizem CD 120 mg daily. History of SVT s/p ablation 2007, continue Cardizem CD 120 mg daily.  Echo 8/24 EF 55 to 60% Complete heart block, complication from SVT ablation s/p pacemaker, monitoring as per device clinic.  Battery is nearing end-of-life in about 5 months.  Follow-up in 12 months     Medication Adjustments/Labs and Tests Ordered: Current medicines are reviewed at length with the patient today.  Concerns regarding medicines are outlined above.  No orders of the defined types were placed in this encounter.  No orders of the defined types were placed in this encounter.   Patient Instructions  Medication Instructions:   Your physician recommends that you continue on your current medications as directed. Please refer to the Current Medication list given to you today.  *If you need a refill on your cardiac medications before your next appointment, please call your pharmacy*   Lab Work:  None Ordered  If you have labs (blood work) drawn today and your tests are completely normal, you will receive your results only by: MyChart Message (if you have MyChart) OR A paper copy in the mail If you have any lab test that is abnormal or we need to change your treatment, we will call you to review the results.   Testing/Procedures:  None Ordered   Follow-Up: At [facility], you and your health needs are our priority.  As part of our continuing mission to provide you with exceptional heart care, we have created designated Provider Care Teams.  These Care Teams include your primary Cardiologist (physician) and Advanced Practice Providers (APPs -  Physician Assistants and Nurse Practitioners) who all work together to provide you with the care  you need, when you need it.  We recommend signing up for the patient portal called "MyChart".  Sign up information is provided on this After Visit Summary.  MyChart is used to connect with patients for Virtual Visits (Telemedicine).  Patients are able to view lab/test results, encounter notes, upcoming appointments, etc.  Non-urgent messages can be sent to your provider as well.   To learn more about what you can do with MyChart, go to [email or web address].    Your next appointment:   12 month(s)  Provider:   You may see Debbe Odea, MD or one of the following Advanced Practice Providers on your designated Care Team:   Nicolasa Ducking, NP Eula Listen, PA-C Cadence Fransico Michael, PA-C Charlsie Quest, NP Carlos Levering, NP    Signed, Debbe Odea, MD  05/14/2023 10:45 AM    [facility]

## 2023-05-23 DIAGNOSIS — M960 Pseudarthrosis after fusion or arthrodesis: Secondary | ICD-10-CM | POA: Diagnosis not present

## 2023-05-23 DIAGNOSIS — M47812 Spondylosis without myelopathy or radiculopathy, cervical region: Secondary | ICD-10-CM | POA: Diagnosis not present

## 2023-06-11 ENCOUNTER — Ambulatory Visit (INDEPENDENT_AMBULATORY_CARE_PROVIDER_SITE_OTHER): Payer: Medicare PPO

## 2023-06-11 DIAGNOSIS — I442 Atrioventricular block, complete: Secondary | ICD-10-CM | POA: Diagnosis not present

## 2023-06-11 LAB — CUP PACEART REMOTE DEVICE CHECK
Battery Remaining Longevity: 5 mo
Battery Voltage: 2.85 V
Brady Statistic AP VP Percent: 3.62 %
Brady Statistic AP VS Percent: 0 %
Brady Statistic AS VP Percent: 96.37 %
Brady Statistic AS VS Percent: 0 %
Brady Statistic RA Percent Paced: 3.62 %
Brady Statistic RV Percent Paced: 99.99 %
Date Time Interrogation Session: 20250108111719
Implantable Lead Connection Status: 753985
Implantable Lead Connection Status: 753985
Implantable Lead Implant Date: 20070319
Implantable Lead Implant Date: 20070319
Implantable Lead Location: 753859
Implantable Lead Location: 753860
Implantable Lead Model: 5076
Implantable Lead Model: 5076
Implantable Pulse Generator Implant Date: 20160210
Lead Channel Impedance Value: 285 Ohm
Lead Channel Impedance Value: 418 Ohm
Lead Channel Impedance Value: 475 Ohm
Lead Channel Impedance Value: 475 Ohm
Lead Channel Pacing Threshold Amplitude: 0.75 V
Lead Channel Pacing Threshold Amplitude: 1.125 V
Lead Channel Pacing Threshold Pulse Width: 0.4 ms
Lead Channel Pacing Threshold Pulse Width: 0.4 ms
Lead Channel Sensing Intrinsic Amplitude: 2.25 mV
Lead Channel Sensing Intrinsic Amplitude: 2.25 mV
Lead Channel Sensing Intrinsic Amplitude: 3.125 mV
Lead Channel Sensing Intrinsic Amplitude: 3.125 mV
Lead Channel Setting Pacing Amplitude: 2.25 V
Lead Channel Setting Pacing Amplitude: 2.5 V
Lead Channel Setting Pacing Pulse Width: 0.4 ms
Lead Channel Setting Sensing Sensitivity: 2 mV
Zone Setting Status: 755011
Zone Setting Status: 755011

## 2023-06-27 ENCOUNTER — Telehealth: Payer: Medicare PPO | Admitting: Family Medicine

## 2023-06-27 ENCOUNTER — Other Ambulatory Visit: Payer: Self-pay | Admitting: Internal Medicine

## 2023-06-27 DIAGNOSIS — J101 Influenza due to other identified influenza virus with other respiratory manifestations: Secondary | ICD-10-CM

## 2023-06-27 MED ORDER — BENZONATATE 200 MG PO CAPS: 200.0000 mg | ORAL_CAPSULE | Freq: Two times a day (BID) | ORAL | 0 refills | Status: DC | PRN

## 2023-06-27 MED ORDER — OSELTAMIVIR PHOSPHATE 75 MG PO CAPS: 75.0000 mg | ORAL_CAPSULE | Freq: Two times a day (BID) | ORAL | 0 refills | Status: AC

## 2023-06-27 NOTE — Progress Notes (Signed)

## 2023-07-03 ENCOUNTER — Ambulatory Visit (HOSPITAL_COMMUNITY)
Admission: RE | Admit: 2023-07-03 | Discharge: 2023-07-03 | Disposition: A | Discharge status: Home / Self Care | Payer: Medicare PPO | Source: Ambulatory Visit | Attending: Orthopedic Surgery | Admitting: Orthopedic Surgery

## 2023-07-03 DIAGNOSIS — M7601 Gluteal tendinitis, right hip: Secondary | ICD-10-CM | POA: Diagnosis not present

## 2023-07-03 DIAGNOSIS — M25551 Pain in right hip: Secondary | ICD-10-CM | POA: Diagnosis not present

## 2023-07-14 ENCOUNTER — Ambulatory Visit (INDEPENDENT_AMBULATORY_CARE_PROVIDER_SITE_OTHER): Payer: Medicare PPO

## 2023-07-14 DIAGNOSIS — I442 Atrioventricular block, complete: Secondary | ICD-10-CM

## 2023-07-15 LAB — CUP PACEART REMOTE DEVICE CHECK
Battery Remaining Longevity: 4 mo
Battery Voltage: 2.85 V
Brady Statistic AP VP Percent: 3.19 %
Brady Statistic AP VS Percent: 0 %
Brady Statistic AS VP Percent: 96.8 %
Brady Statistic AS VS Percent: 0.01 %
Brady Statistic RA Percent Paced: 3.19 %
Brady Statistic RV Percent Paced: 99.99 %
Date Time Interrogation Session: 20250210101519
Implantable Lead Connection Status: 753985
Implantable Lead Connection Status: 753985
Implantable Lead Implant Date: 20070319
Implantable Lead Implant Date: 20070319
Implantable Lead Location: 753859
Implantable Lead Location: 753860
Implantable Lead Model: 5076
Implantable Lead Model: 5076
Implantable Pulse Generator Implant Date: 20160210
Lead Channel Impedance Value: 304 Ohm
Lead Channel Impedance Value: 456 Ohm
Lead Channel Impedance Value: 494 Ohm
Lead Channel Impedance Value: 494 Ohm
Lead Channel Pacing Threshold Amplitude: 0.875 V
Lead Channel Pacing Threshold Amplitude: 1.25 V
Lead Channel Pacing Threshold Pulse Width: 0.4 ms
Lead Channel Pacing Threshold Pulse Width: 0.4 ms
Lead Channel Sensing Intrinsic Amplitude: 1.75 mV
Lead Channel Sensing Intrinsic Amplitude: 1.75 mV
Lead Channel Sensing Intrinsic Amplitude: 4.875 mV
Lead Channel Sensing Intrinsic Amplitude: 4.875 mV
Lead Channel Setting Pacing Amplitude: 2.5 V
Lead Channel Setting Pacing Amplitude: 2.5 V
Lead Channel Setting Pacing Pulse Width: 0.4 ms
Lead Channel Setting Sensing Sensitivity: 2 mV
Zone Setting Status: 755011
Zone Setting Status: 755011

## 2023-07-23 NOTE — Progress Notes (Signed)
 Remote pacemaker transmission.

## 2023-07-28 DIAGNOSIS — Z6827 Body mass index (BMI) 27.0-27.9, adult: Secondary | ICD-10-CM | POA: Diagnosis not present

## 2023-07-28 DIAGNOSIS — R208 Other disturbances of skin sensation: Secondary | ICD-10-CM | POA: Diagnosis not present

## 2023-07-30 ENCOUNTER — Other Ambulatory Visit (HOSPITAL_COMMUNITY): Payer: Self-pay | Admitting: Surgery

## 2023-07-30 ENCOUNTER — Encounter: Payer: Self-pay | Admitting: Internal Medicine

## 2023-07-30 DIAGNOSIS — R208 Other disturbances of skin sensation: Secondary | ICD-10-CM

## 2023-08-04 ENCOUNTER — Encounter: Payer: Self-pay | Admitting: Internal Medicine

## 2023-08-08 ENCOUNTER — Ambulatory Visit: Payer: Medicare PPO

## 2023-08-09 ENCOUNTER — Other Ambulatory Visit: Payer: Self-pay | Admitting: Family Medicine

## 2023-08-09 DIAGNOSIS — F339 Major depressive disorder, recurrent, unspecified: Secondary | ICD-10-CM

## 2023-08-11 NOTE — Telephone Encounter (Signed)
 Requested medication (s) are due for refill today: yes  Requested medication (s) are on the active medication list: yes  Last refill:  01/17/23 #90 1 RF  Future visit scheduled: yes  Notes to clinic:  overdue labs    Requested Prescriptions  Pending Prescriptions Disp Refills   venlafaxine XR (EFFEXOR-XR) 150 MG 24 hr capsule [Pharmacy Med Name: VENLAFAXINE ER 150MG  CAPSULES] 90 capsule 1    Sig: TAKE 1 CAPSULE(150 MG) BY MOUTH DAILY WITH BREAKFAST     Psychiatry: Antidepressants - SNRI - desvenlafaxine & venlafaxine Failed - 08/11/2023  3:18 PM      Failed - Valid encounter within last 6 months    Recent Outpatient Visits           6 months ago Primary hypertension   [facility] Cath[place], Megan P, DO   1 year ago Encounter for [employer] annual wellness exam   [place] [facility] [place], [place], DO   1 year ago Primary hypertension   [place] [facility] [place], [place] P, DO   2 years ago Routine general medical examination at a health care facility   [facility] [place], [place] P, DO   2 years ago Breast lump on left side at 2 o'clock position   [facility] [place], Oralia Rud, DO       Future Appointments             In 1 week Dorcas Carrow, DO [facility]            Failed - Lipid Panel in normal range within the last 12 months    Cholesterol, Total  Date Value Ref Range Status  08/08/2022 196 100 - 199 mg/dL Final   LDL Chol Calc (NIH)  Date Value Ref Range Status  08/08/2022 102 (H) 0 - 99 mg/dL Final   HDL  Date Value Ref Range Status  08/08/2022 75 >39 mg/dL Final   Triglycerides  Date Value Ref Range Status  08/08/2022 106 0 - 149 mg/dL Final         Passed - Cr in normal range and within 360 days    Creatinine  Date Value Ref Range Status  04/03/2012 0.97 0.60 - 1.30 mg/dL Final   Creatinine, Ser  Date  Value Ref Range Status  10/29/2022 0.82 0.44 - 1.00 mg/dL Final         Passed - Completed PHQ-2 or PHQ-9 in the last 360 days      Passed - Last BP in normal range    BP Readings from Last 1 Encounters:  05/14/23 122/78

## 2023-08-11 NOTE — Telephone Encounter (Signed)
 Refill needed before appointment next week.

## 2023-08-14 ENCOUNTER — Ambulatory Visit: Payer: Medicare PPO

## 2023-08-14 DIAGNOSIS — I442 Atrioventricular block, complete: Secondary | ICD-10-CM

## 2023-08-14 LAB — CUP PACEART REMOTE DEVICE CHECK
Battery Remaining Longevity: 3 mo
Battery Voltage: 2.85 V
Brady Statistic AP VP Percent: 2.54 %
Brady Statistic AP VS Percent: 0 %
Brady Statistic AS VP Percent: 97.45 %
Brady Statistic AS VS Percent: 0.01 %
Brady Statistic RA Percent Paced: 2.54 %
Brady Statistic RV Percent Paced: 99.98 %
Date Time Interrogation Session: 20250313110917
Implantable Lead Connection Status: 753985
Implantable Lead Connection Status: 753985
Implantable Lead Implant Date: 20070319
Implantable Lead Implant Date: 20070319
Implantable Lead Location: 753859
Implantable Lead Location: 753860
Implantable Lead Model: 5076
Implantable Lead Model: 5076
Implantable Pulse Generator Implant Date: 20160210
Lead Channel Impedance Value: 285 Ohm
Lead Channel Impedance Value: 418 Ohm
Lead Channel Impedance Value: 456 Ohm
Lead Channel Impedance Value: 475 Ohm
Lead Channel Pacing Threshold Amplitude: 0.875 V
Lead Channel Pacing Threshold Amplitude: 1.125 V
Lead Channel Pacing Threshold Pulse Width: 0.4 ms
Lead Channel Pacing Threshold Pulse Width: 0.4 ms
Lead Channel Sensing Intrinsic Amplitude: 2.625 mV
Lead Channel Sensing Intrinsic Amplitude: 2.625 mV
Lead Channel Sensing Intrinsic Amplitude: 3.625 mV
Lead Channel Sensing Intrinsic Amplitude: 3.625 mV
Lead Channel Setting Pacing Amplitude: 2.25 V
Lead Channel Setting Pacing Amplitude: 2.5 V
Lead Channel Setting Pacing Pulse Width: 0.4 ms
Lead Channel Setting Sensing Sensitivity: 2 mV
Zone Setting Status: 755011
Zone Setting Status: 755011

## 2023-08-18 ENCOUNTER — Encounter: Payer: Self-pay | Admitting: Family Medicine

## 2023-08-18 ENCOUNTER — Ambulatory Visit (INDEPENDENT_AMBULATORY_CARE_PROVIDER_SITE_OTHER): Payer: Medicare PPO | Admitting: Family Medicine

## 2023-08-18 VITALS — BP 117/88 | HR 102 | Ht 64.0 in | Wt 156.6 lb

## 2023-08-18 DIAGNOSIS — G4700 Insomnia, unspecified: Secondary | ICD-10-CM | POA: Diagnosis not present

## 2023-08-18 DIAGNOSIS — Z1231 Encounter for screening mammogram for malignant neoplasm of breast: Secondary | ICD-10-CM | POA: Diagnosis not present

## 2023-08-18 DIAGNOSIS — G901 Familial dysautonomia [Riley-Day]: Secondary | ICD-10-CM

## 2023-08-18 DIAGNOSIS — I4891 Unspecified atrial fibrillation: Secondary | ICD-10-CM

## 2023-08-18 DIAGNOSIS — F339 Major depressive disorder, recurrent, unspecified: Secondary | ICD-10-CM | POA: Diagnosis not present

## 2023-08-18 DIAGNOSIS — Z0000 Encounter for general adult medical examination without abnormal findings: Secondary | ICD-10-CM

## 2023-08-18 DIAGNOSIS — I10 Essential (primary) hypertension: Secondary | ICD-10-CM

## 2023-08-18 DIAGNOSIS — Z1283 Encounter for screening for malignant neoplasm of skin: Secondary | ICD-10-CM

## 2023-08-18 LAB — MICROALBUMIN, URINE WAIVED
Creatinine, Urine Waived: 300 mg/dL (ref 10–300)
Microalb, Ur Waived: 150 mg/L — ABNORMAL HIGH (ref 0–19)

## 2023-08-18 MED ORDER — VENLAFAXINE HCL ER 150 MG PO CP24
ORAL_CAPSULE | ORAL | 1 refills | Status: DC
Start: 2023-08-18 — End: 2024-02-19

## 2023-08-18 MED ORDER — TRAZODONE HCL 50 MG PO TABS: 25.0000 mg | ORAL_TABLET | Freq: Every evening | ORAL | 3 refills | Status: DC | PRN

## 2023-08-18 MED ORDER — LORAZEPAM 0.5 MG PO TABS: ORAL_TABLET | ORAL | 1 refills | Status: DC

## 2023-08-18 NOTE — Assessment & Plan Note (Signed)
 Will start her on trazodone and check in about 2 months. Call with any concerns.

## 2023-08-18 NOTE — Assessment & Plan Note (Signed)
 Under good control on current regimen. Continue current regimen. Continue to monitor. Call with any concerns. Refills given. Labs drawn today.

## 2023-08-18 NOTE — Progress Notes (Signed)
 Remote pacemaker transmission.

## 2023-08-18 NOTE — Addendum Note (Signed)
 Addended by: Geralyn Flash D on: 08/18/2023 01:52 PM   Modules accepted: Orders, Level of Service

## 2023-08-18 NOTE — Assessment & Plan Note (Signed)
 Continue to follow with cardiology. Call with any concerns. Continue to monitor.

## 2023-08-18 NOTE — Progress Notes (Signed)
 BP 117/88 (BP Location: Left Arm, Patient Position: Sitting, Cuff Size: Large)   Pulse (!) 102   Ht 5\' 4"  (1.626 m)   Wt 156 lb 9.6 oz (71 kg)   SpO2 99%   BMI 26.88 kg/m    Subjective:    Patient ID: Angela [name], female    DOB: [birth year], 53 y.o.   MRN: [id number]  HPI: Angela [name] is a 53 y.o. female presenting on 08/18/2023 for comprehensive medical examination. Current medical complaints include:  HYPERTENSION  Hypertension status: controlled  Satisfied with current treatment? yes Duration of hypertension: chronic BP monitoring frequency:  a few times a month BP medication side effects:  no Medication compliance: excellent compliance Previous BP meds:losartan-hydrochlorothiazide, diltiazem Aspirin: no Recurrent headaches: no Visual changes: no Palpitations: no Dyspnea: no Chest pain: no Lower extremity edema: no Dizzy/lightheaded: no  DEPRESSION Mood status: controlled Satisfied with current treatment?: yes Symptom severity: mild  Duration of current treatment : chronic Side effects: no Medication compliance: excellent compliance Psychotherapy/counseling: no  Previous psychiatric medications: effexor Depressed mood: no Anxious mood: no Anhedonia: no Significant weight loss or gain: no Insomnia: yes hard to stay asleep Fatigue: yes Feelings of worthlessness or guilt: no Impaired concentration/indecisiveness: no Suicidal ideations: no Hopelessness: no Crying spells: no    08/18/2023    9:12 AM 01/17/2023    3:13 PM 08/08/2022    9:31 AM 01/08/2022    9:04 AM 07/31/2021    9:27 AM  Depression screen PHQ 2/9  Decreased Interest 1 2 1 1 1   Down, Depressed, Hopeless 1 1 1 1 1   PHQ - 2 Score 2 3 2 2 2   Altered sleeping 3 3 3 2 1   Tired, decreased energy 2 2 2 1 2   Change in appetite 0 1 2 0 0  Feeling bad or failure about yourself  0 1 0 0 0  Trouble concentrating 2 2 2 1  0  Moving slowly or fidgety/restless 2 1 2  0 0  Suicidal thoughts 0 0 0  0 0  PHQ-9 Score 11 13 13 6 5   Difficult doing work/chores  Very difficult Very difficult Somewhat difficult Not difficult at all   INSOMNIA Duration: chronic Satisfied with sleep quality: no Difficulty falling asleep: yes Difficulty staying asleep: yes Waking a few hours after sleep onset: yes Early morning awakenings: yes Daytime hypersomnolence: yes Wakes feeling refreshed: no Good sleep hygiene: yes Apnea: no Snoring: no Depressed/anxious mood: no Recent stress: no Restless legs/nocturnal leg cramps: no Chronic pain/arthritis: no History of sleep study: yes Treatments attempted: ambien (sleep activities), lorazepam, melatonin   She currently lives with: husband Menopausal Symptoms: yes- much better with the GLP-1  Depression Screen done today and results listed below:     08/18/2023    9:12 AM 01/17/2023    3:13 PM 08/08/2022    9:31 AM 01/08/2022    9:04 AM 07/31/2021    9:27 AM  Depression screen PHQ 2/9  Decreased Interest 1 2 1 1 1   Down, Depressed, Hopeless 1 1 1 1 1   PHQ - 2 Score 2 3 2 2 2   Altered sleeping 3 3 3 2 1   Tired, decreased energy 2 2 2 1 2   Change in appetite 0 1 2 0 0  Feeling bad or failure about yourself  0 1 0 0 0  Trouble concentrating 2 2 2 1  0  Moving slowly or fidgety/restless 2 1 2  0 0  Suicidal thoughts 0 0 0 0  0  PHQ-9 Score 11 13 13 6 5   Difficult doing work/chores  Very difficult Very difficult Somewhat difficult Not difficult at all    Past Medical History:  Past Medical History:  Diagnosis Date   Arthritis 2016   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 EF 50-55% normal coronaries   Complete heart block (HCC)    Depression    Dysautonomia (HCC)    Dyspnea    CT negative for PE, 2007   Hypertension    Insomnia    OCD (obsessive compulsive disorder)    SVT (supraventricular tachycardia) (HCC)    s/p ablation a. c/b AV nod ablation requiring pacemaker     Surgical History:  Past Surgical History:  Procedure Laterality Date   ANTERIOR CERVICAL DECOMP/DISCECTOMY FUSION N/A 09/20/2021   Procedure: Anterior Cervical Discectomy Fusion  Cervical Four-Five, Cervical Five-Six, Cervical Six-Seven;  Surgeon: Dawley, Alan Mulder, DO;  Location: [facility] OR;  Service: Neurosurgery;  Laterality: N/A;  3C   BREAST BIOPSY Right 1998   BREAST BIOPSY Right 2004   BREAST LUMPECTOMY     RIGHT -BREAST    COLONOSCOPY WITH PROPOFOL N/A 09/07/2019   Procedure: COLONOSCOPY WITH PROPOFOL;  Surgeon: Midge Minium, MD;  Location: [facility] ENDOSCOPY;  Service: Endoscopy;  Laterality: N/A;   ENDOMETRIAL ABLATION  2005   INSERT / REPLACE / REMOVE PACEMAKER  2007   PELVIC LAPAROSCOPY  1990   PERMANENT PACEMAKER GENERATOR CHANGE N/A 07/13/2014   MDT MRI compatible dual chamber pacemaker implanted by Dr Graciela Husbands   POSTERIOR CERVICAL FUSION/FORAMINOTOMY N/A 11/05/2022   Procedure: POSTERIOR CERVICAL INSTRUMENTATION AND FUSION CERVICAL SIX-THORACIC ONE;  Surgeon: Bethann Goo, DO;  Location: [facility] OR;  Service: Neurosurgery;  Laterality: N/A;  3C   SPARK  2005   TUBAL LIGATION  2003    Medications:  Current Outpatient Medications on File Prior to Visit  Medication Sig   celecoxib (CELEBREX) 100 MG capsule Take 100 mg by mouth 2 (two) times daily.   diltiazem (CARDIZEM CD) 120 MG 24 hr capsule Take 1 capsule (120 mg total) by mouth daily.   FIBER ADULT GUMMIES PO Take 2 each by mouth daily. Vita Fusion Fiberwell   gabapentin (NEURONTIN) 100 MG capsule Take 1 capsule (100 mg total) by mouth 3 (three) times daily.   hydroxychloroquine (PLAQUENIL) 200 MG tablet Take 2 tablets (400 mg total) by mouth daily.   losartan-hydrochlorothiazide (HYZAAR) 50-12.5 MG tablet TAKE 1 TABLET BY MOUTH DAILY   methocarbamol (ROBAXIN) 500 MG tablet Take 1 tablet (500 mg total) by mouth every 6 (six) hours as needed for muscle spasms.   Multiple Vitamins-Minerals (MULTIVITAMIN WITH MINERALS) tablet Take 1  tablet by mouth daily.   tirzepatide 7.5 MG/0.5ML injection vial Inject into the skin.   traMADol (ULTRAM) 50 MG tablet Take 50 mg by mouth every 6 (six) hours as needed for moderate pain.   No current facility-administered medications on file prior to visit.    Allergies:  Allergies  Allergen Reactions   Toprol Xl [Metoprolol Succinate] Photosensitivity    Social History:  Social History   Socioeconomic History   Marital status: Married    Spouse name: Darin   Number of children: 2   Years of education: Not on file   Highest education level: Bachelor's degree (e.g., BA, AB, BS)  Occupational History   Occupation: disability   Tobacco Use   Smoking status: Never  Smokeless tobacco: Never  Vaping Use   Vaping status: Never Used  Substance and Sexual Activity   Alcohol use: No   Drug use: No   Sexual activity: Yes    Birth control/protection: Surgical    Comment: Uterine ablarion 2002  Other Topics Concern   Not on file  Social History Narrative   Work with daughters school    Social Drivers of Health   Financial Resource Strain: Low Risk  (08/17/2023)   Overall Financial Resource Strain (CARDIA)    Difficulty of Paying Living Expenses: Not very hard  Food Insecurity: No Food Insecurity (08/17/2023)   Hunger Vital Sign    Worried About Running Out of Food in the Last Year: Never true    Ran Out of Food in the Last Year: Never true  Transportation Needs: No Transportation Needs (08/17/2023)   PRAPARE - Administrator, Civil Service (Medical): No    Lack of Transportation (Non-Medical): No  Physical Activity: Insufficiently Active (08/17/2023)   Exercise Vital Sign    Days of Exercise per Week: 2 days    Minutes of Exercise per Session: 20 min  Stress: Stress Concern Present (08/17/2023)   [employer] of Occupational Health - Occupational Stress Questionnaire    Feeling of Stress : Very much  Social Connections: Socially Integrated (08/17/2023)    Social Connection and Isolation Panel [NHANES]    Frequency of Communication with Friends and Family: Three times a week    Frequency of Social Gatherings with Friends and Family: Once a week    Attends Religious Services: More than 4 times per year    Active Member of [employer] or Organizations: Yes    Attends Engineer, structural: More than 4 times per year    Marital Status: Married  Catering manager Violence: Not At Risk (07/31/2021)   Humiliation, Afraid, Rape, and Kick questionnaire    Fear of Current or Ex-Partner: No    Emotionally Abused: No    Physically Abused: No    Sexually Abused: No   Social History   Tobacco Use  Smoking Status Never  Smokeless Tobacco Never   Social History   Substance and Sexual Activity  Alcohol Use No    Family History:  Family History  Problem Relation Age of Onset   Anxiety disorder Mother    Arthritis Mother    Obesity Mother    Cervical cancer Mother    Hypertension Father    Cancer Father 9       COLON   Diabetes Father    Anxiety disorder Sister    Breast cancer Maternal Aunt        mastectomy   Cancer Maternal Aunt    Breast cancer Paternal Aunt    Cancer Paternal Aunt    Breast cancer Paternal Aunt    Dementia Maternal Grandmother    Hypertension Maternal Grandfather    Diabetes Paternal Grandmother    Breast cancer Paternal Grandmother    Arthritis Paternal Grandmother    Cancer Paternal Grandmother    Diabetes Paternal Grandfather    Cancer Paternal Grandfather     Past medical history, surgical history, medications, allergies, family history and social history reviewed with patient today and changes made to appropriate areas of the chart.   Review of Systems  Constitutional: Negative.   HENT: Negative.    Eyes: Negative.   Respiratory:  Positive for shortness of breath. Negative for cough, hemoptysis, sputum production and wheezing.   Cardiovascular:  Positive for palpitations. Negative for chest pain,  orthopnea, claudication, leg swelling and PND.  Gastrointestinal:  Positive for constipation. Negative for abdominal pain, blood in stool, diarrhea, heartburn, melena, nausea and vomiting.  Genitourinary: Negative.   Musculoskeletal:  Positive for back pain. Negative for falls, joint pain, myalgias and neck pain.  Skin: Negative.   Neurological:  Positive for dizziness. Negative for tingling, tremors, sensory change, speech change, focal weakness, seizures, loss of consciousness, weakness and headaches.  Endo/Heme/Allergies:  Positive for polydipsia. Negative for environmental allergies. Does not bruise/bleed easily.  Psychiatric/Behavioral:  Negative for depression, hallucinations, memory loss, substance abuse and suicidal ideas. The patient has insomnia. The patient is not nervous/anxious.    All other ROS negative except what is listed above and in the HPI.      Objective:    BP 117/88 (BP Location: Left Arm, Patient Position: Sitting, Cuff Size: Large)   Pulse (!) 102   Ht 5\' 4"  (1.626 m)   Wt 156 lb 9.6 oz (71 kg)   SpO2 99%   BMI 26.88 kg/m   Wt Readings from Last 3 Encounters:  08/18/23 156 lb 9.6 oz (71 kg)  05/14/23 177 lb 6.4 oz (80.5 kg)  02/12/23 168 lb (76.2 kg)    Physical Exam Vitals and nursing note reviewed.  Constitutional:      General: She is not in acute distress.    Appearance: Normal appearance. She is not ill-appearing, toxic-appearing or diaphoretic.  HENT:     Head: Normocephalic and atraumatic.     Right Ear: Tympanic membrane, ear canal and external ear normal. There is no impacted cerumen.     Left Ear: Tympanic membrane, ear canal and external ear normal. There is no impacted cerumen.     Nose: Nose normal. No congestion or rhinorrhea.     Mouth/Throat:     Mouth: Mucous membranes are moist.     Pharynx: Oropharynx is clear. No oropharyngeal exudate or posterior oropharyngeal erythema.  Eyes:     General: No scleral icterus.       Right eye: No  discharge.        Left eye: No discharge.     Extraocular Movements: Extraocular movements intact.     Conjunctiva/sclera: Conjunctivae normal.     Pupils: Pupils are equal, round, and reactive to light.  Neck:     Vascular: No carotid bruit.  Cardiovascular:     Rate and Rhythm: Normal rate and regular rhythm.     Pulses: Normal pulses.     Heart sounds: No murmur heard.    No friction rub. No gallop.  Pulmonary:     Effort: Pulmonary effort is normal. No respiratory distress.     Breath sounds: Normal breath sounds. No stridor. No wheezing, rhonchi or rales.  Chest:     Chest wall: No tenderness.  Abdominal:     General: Abdomen is flat. Bowel sounds are normal. There is no distension.     Palpations: Abdomen is soft. There is no mass.     Tenderness: There is no abdominal tenderness. There is no right CVA tenderness, left CVA tenderness, guarding or rebound.     Hernia: No hernia is present.  Genitourinary:    Comments: Breast and pelvic exams deferred with shared decision making Musculoskeletal:        General: No swelling, tenderness, deformity or signs of injury.     Cervical back: Normal range of motion and neck supple. No rigidity. No muscular tenderness.  Right lower leg: No edema.     Left lower leg: No edema.  Lymphadenopathy:     Cervical: No cervical adenopathy.  Skin:    General: Skin is warm and dry.     Capillary Refill: Capillary refill takes less than 2 seconds.     Coloration: Skin is not jaundiced or pale.     Findings: No bruising, erythema, lesion or rash.  Neurological:     General: No focal deficit present.     Mental Status: She is alert and oriented to person, place, and time. Mental status is at baseline.     Cranial Nerves: No cranial nerve deficit.     Sensory: No sensory deficit.     Motor: No weakness.     Coordination: Coordination normal.     Gait: Gait normal.     Deep Tendon Reflexes: Reflexes normal.  Psychiatric:        Mood and  Affect: Mood normal.        Behavior: Behavior normal.        Thought Content: Thought content normal.        Judgment: Judgment normal.     Results for orders placed or performed in visit on 08/18/23  Microalbumin, Urine Waived   Collection Time: 08/18/23  9:13 AM  Result Value Ref Range   Microalb, Ur Waived 150 (H) 0 - 19 mg/L   Creatinine, Urine Waived 300 10 - 300 mg/dL   Microalb/Creat Ratio 30-300 (H) <30 mg/g      Assessment & Plan:   Problem List Items Addressed This Visit       Cardiovascular and Mediastinum   Atrial fibrillation (HCC)   Continue to follow with cardiology. Call with any concerns. Continue to monitor.       Hypertension   Under good control on current regimen. Continue current regimen. Continue to monitor. Call with any concerns. Refills given. Labs drawn today.      Relevant Orders   Microalbumin, Urine Waived (Completed)     Nervous and Auditory   Dysautonomia (HCC)   Continue to follow with cardiology. Call with any concerns. Continue to monitor.         Other   Depression, recurrent (HCC)   Under good control on current regimen. Continue current regimen. Continue to monitor. Call with any concerns. Refills given. Labs drawn today.       Relevant Medications   traZODone (DESYREL) 50 MG tablet   LORazepam (ATIVAN) 0.5 MG tablet   venlafaxine XR (EFFEXOR-XR) 150 MG 24 hr capsule   Insomnia   Will start her on trazodone and check in about 2 months. Call with any concerns.       Other Visit Diagnoses       Routine general medical examination at a health care facility    -  Primary   Vaccines up to date. Screening labs checked today. Pap up to date. Mammo ordered. Colonoscopy up to date. Continue diet and exercise.   Relevant Orders   CBC with Differential/Platelet   Comprehensive metabolic panel   Lipid Panel w/o Chol/HDL Ratio   TSH     Screening for skin cancer       Referral to dermatology. Call with any concerns.   Relevant  Orders   Ambulatory referral to Dermatology     Encounter for screening mammogram for malignant neoplasm of breast       Mammo ordered today.   Relevant Orders   MM 3D SCREENING MAMMOGRAM  BILATERAL BREAST        Follow up plan: Return for ASAP with Gunnar Fusi for AWV, 2 months with me.   LABORATORY TESTING:  - Pap smear: Up to date  IMMUNIZATIONS:   - Tdap: Tetanus vaccination status reviewed: last tetanus booster within 10 years. - Influenza: Refused - Pneumovax: Refused - Prevnar: Refused - COVID: Refused - HPV: Refused - Shingrix vaccine: will get at pharmacy  SCREENING: -Mammogram: Ordered today  - Colonoscopy: Up to date   PATIENT COUNSELING:   Advised to take 1 mg of folate supplement per day if capable of pregnancy.   Sexuality: Discussed sexually transmitted diseases, partner selection, use of condoms, avoidance of unintended pregnancy  and contraceptive alternatives.   Advised to avoid cigarette smoking.  I discussed with the patient that most people either abstain from alcohol or drink within safe limits (<=14/week and <=4 drinks/occasion for males, <=7/weeks and <= 3 drinks/occasion for females) and that the risk for alcohol disorders and other health effects rises proportionally with the number of drinks per week and how often a drinker exceeds daily limits.  Discussed cessation/primary prevention of drug use and availability of treatment for abuse.   Diet: Encouraged to adjust caloric intake to maintain  or achieve ideal body weight, to reduce intake of dietary saturated fat and total fat, to limit sodium intake by avoiding high sodium foods and not adding table salt, and to maintain adequate dietary potassium and calcium preferably from fresh fruits, vegetables, and low-fat dairy products.    stressed the importance of regular exercise  Injury prevention: Discussed safety belts, safety helmets, smoke detector, smoking near bedding or upholstery.   Dental  health: Discussed importance of regular tooth brushing, flossing, and dental visits.    NEXT PREVENTATIVE PHYSICAL DUE IN 1 YEAR. Return for ASAP with Gunnar Fusi for AWV, 2 months with me.

## 2023-08-19 ENCOUNTER — Encounter: Payer: Self-pay | Admitting: Family Medicine

## 2023-08-19 LAB — COMPREHENSIVE METABOLIC PANEL
ALT: 15 IU/L (ref 0–32)
AST: 22 IU/L (ref 0–40)
Albumin: 4.6 g/dL (ref 3.8–4.9)
Alkaline Phosphatase: 67 IU/L (ref 44–121)
BUN/Creatinine Ratio: 14 (ref 9–23)
BUN: 13 mg/dL (ref 6–24)
Bilirubin Total: 0.4 mg/dL (ref 0.0–1.2)
CO2: 23 mmol/L (ref 20–29)
Calcium: 9.8 mg/dL (ref 8.7–10.2)
Chloride: 101 mmol/L (ref 96–106)
Creatinine, Ser: 0.94 mg/dL (ref 0.57–1.00)
Globulin, Total: 1.8 g/dL (ref 1.5–4.5)
Glucose: 73 mg/dL (ref 70–99)
Potassium: 4.2 mmol/L (ref 3.5–5.2)
Sodium: 140 mmol/L (ref 134–144)
Total Protein: 6.4 g/dL (ref 6.0–8.5)
eGFR: 73 mL/min/{1.73_m2} (ref >59)

## 2023-08-19 LAB — CBC WITH DIFFERENTIAL/PLATELET
Basophils Absolute: 0.1 10*3/uL (ref 0.0–0.2)
Basos: 1 %
EOS (ABSOLUTE): 0.1 10*3/uL (ref 0.0–0.4)
Eos: 1 %
Hematocrit: 46.6 % (ref 34.0–46.6)
Hemoglobin: 15.4 g/dL (ref 11.1–15.9)
Immature Grans (Abs): 0 10*3/uL (ref 0.0–0.1)
Immature Granulocytes: 0 %
Lymphocytes Absolute: 2.1 10*3/uL (ref 0.7–3.1)
Lymphs: 42 %
MCH: 31.4 pg (ref 26.6–33.0)
MCHC: 33 g/dL (ref 31.5–35.7)
MCV: 95 fL (ref 79–97)
Monocytes Absolute: 0.4 10*3/uL (ref 0.1–0.9)
Monocytes: 8 %
Neutrophils Absolute: 2.4 10*3/uL (ref 1.4–7.0)
Neutrophils: 48 %
Platelets: 249 10*3/uL (ref 150–450)
RBC: 4.9 x10E6/uL (ref 3.77–5.28)
RDW: 12.2 % (ref 11.7–15.4)
WBC: 5.1 10*3/uL (ref 3.4–10.8)

## 2023-08-19 LAB — TSH: TSH: 1.21 u[IU]/mL (ref 0.450–4.500)

## 2023-08-19 LAB — LIPID PANEL W/O CHOL/HDL RATIO
Cholesterol, Total: 177 mg/dL (ref 100–199)
HDL: 68 mg/dL (ref >39)
LDL Chol Calc (NIH): 90 mg/dL (ref 0–99)
Triglycerides: 105 mg/dL (ref 0–149)
VLDL Cholesterol Cal: 19 mg/dL (ref 5–40)

## 2023-09-02 ENCOUNTER — Ambulatory Visit (HOSPITAL_COMMUNITY)
Admission: RE | Admit: 2023-09-02 | Discharge: 2023-09-02 | Disposition: A | Discharge status: Home / Self Care | Payer: Medicare PPO | Source: Ambulatory Visit | Attending: Surgery | Admitting: Surgery

## 2023-09-02 DIAGNOSIS — R208 Other disturbances of skin sensation: Secondary | ICD-10-CM | POA: Diagnosis present

## 2023-09-02 NOTE — Progress Notes (Signed)
  Device system confirmed to be MRI conditional, with implant date > 6 weeks ago, and no evidence of abandoned or epicardial leads in review of most recent CXR  Device last cleared by EP Provider: Canary Brim NP    Clearance is good through for 1 year as long as parameters remain stable at time of check. If pt undergoes a cardiac device procedure during that time, they should be re-cleared.   Tachy-therapies to be programmed off if applicable with device back to pre-MRI settings after completion of exam.  Medtronic - Programming recommendation received through Medtronic App/Tablet  Terri Piedra  09/02/2023 10:29 AM

## 2023-09-13 ENCOUNTER — Encounter: Payer: Self-pay | Admitting: Internal Medicine

## 2023-09-15 ENCOUNTER — Encounter: Payer: Medicare PPO | Admitting: Student

## 2023-09-15 ENCOUNTER — Ambulatory Visit (INDEPENDENT_AMBULATORY_CARE_PROVIDER_SITE_OTHER): Payer: Medicare PPO

## 2023-09-15 DIAGNOSIS — I442 Atrioventricular block, complete: Secondary | ICD-10-CM

## 2023-09-15 NOTE — Progress Notes (Unsigned)
  Electrophysiology Office Note:   Date:  09/16/2023  ID:  Angela [name], DOB 1971/05/02, MRN [id number]  Primary Cardiologist: Constancia Delton, MD Primary Heart Failure: None Electrophysiologist: Richardo Chandler, MD       History of Present Illness:   Angela [name] is a 53 y.o. female with h/o CHB s/p PPM post ablation, SVT/AVNRT, POTS, Orthostatic intolerance, AT / ST, arthritis, obesity seen today for routine electrophysiology followup.   Since last being seen in our clinic the patient reports she is anxious to have her battery changed.  She reports she wants to make a trip to see her daughter in [place]  but does not want to do until she has her generator change completed.  She needs to help her do a few things in regards to car, drivers license etc after graduating college. She has anxiety around [place] the [place]. She knows she is going to have to change EP MD's with Dr. Doyle Generous retirement and would like to have someone that prays with her.   She denies chest pain, palpitations, dyspnea, PND, orthopnea, nausea, vomiting, dizziness, syncope, edema, weight gain, or early satiety.   Review of systems complete and found to be negative unless listed in HPI.   EP Information / Studies Reviewed:    EKG is not ordered today. EKG from 03/25/2023 reviewed which showed ASVP 75 bpm      PPM Interrogation-  reviewed in detail today,  See PACEART report.  Device History: Medtronic Dual Chamber PPM implanted 08/19/05 for CHB  Studies:  ECHO 03/2020 > LVEF 55-60%   Arrhythmia / AAD AT  CHB           Physical Exam:   VS:  BP 112/74   Pulse 92   Ht 5\' 4"  (1.626 m)   Wt 150 lb 3.2 oz (68.1 kg)   SpO2 98%   BMI 25.78 kg/m    Wt Readings from Last 3 Encounters:  09/16/23 150 lb 3.2 oz (68.1 kg)  08/18/23 156 lb 9.6 oz (71 kg)  05/14/23 177 lb 6.4 oz (80.5 kg)     GEN: Well nourished, well developed in no acute distress NECK: No JVD; No carotid bruits CARDIAC: Regular  rate and rhythm, no murmurs, rubs, gallops, site well healed without tethering RESPIRATORY:  Clear to auscultation without rales, wheezing or rhonchi  ABDOMEN: Soft, non-tender, non-distended EXTREMITIES:  No edema; No deformity   ASSESSMENT AND PLAN:    CHB s/p Medtronic PPM  SVT AVNRT  POTS / Orthostatic Intolerance  -intolerant of prior device programming of DDD - DDI -recent ECHO wnl / normal EF -Normal PPM function -See Pace Art report -No changes today -plan for visit in one month, anticipate she will be at [facility].  Will schedule a remote in 2 weeks to follow battery.   Hypertension  -well controlled on current regimen    Disposition:   Follow up with EP APP in 4 weeks  Signed, [name], NP-C, AGACNP-BC [facility] - Electrophysiology  09/16/2023, 9:53 AM

## 2023-09-16 ENCOUNTER — Encounter: Payer: Self-pay | Admitting: Pulmonary Disease

## 2023-09-16 ENCOUNTER — Ambulatory Visit: Attending: Pulmonary Disease | Admitting: Pulmonary Disease

## 2023-09-16 VITALS — BP 112/74 | HR 92 | Ht 64.0 in | Wt 150.2 lb

## 2023-09-16 DIAGNOSIS — I471 Supraventricular tachycardia, unspecified: Secondary | ICD-10-CM

## 2023-09-16 DIAGNOSIS — I442 Atrioventricular block, complete: Secondary | ICD-10-CM | POA: Diagnosis not present

## 2023-09-16 DIAGNOSIS — I4719 Other supraventricular tachycardia: Secondary | ICD-10-CM

## 2023-09-16 DIAGNOSIS — Z950 Presence of cardiac pacemaker: Secondary | ICD-10-CM | POA: Diagnosis not present

## 2023-09-16 DIAGNOSIS — G90A Postural orthostatic tachycardia syndrome (POTS): Secondary | ICD-10-CM

## 2023-09-16 DIAGNOSIS — I10 Essential (primary) hypertension: Secondary | ICD-10-CM

## 2023-09-16 LAB — CUP PACEART INCLINIC DEVICE CHECK
Date Time Interrogation Session: 20250415095922
Implantable Lead Connection Status: 753985
Implantable Lead Connection Status: 753985
Implantable Lead Implant Date: 20070319
Implantable Lead Implant Date: 20070319
Implantable Lead Location: 753859
Implantable Lead Location: 753860
Implantable Lead Model: 5076
Implantable Lead Model: 5076
Implantable Pulse Generator Implant Date: 20160210

## 2023-09-16 LAB — CUP PACEART REMOTE DEVICE CHECK
Battery Remaining Longevity: 2 mo
Battery Voltage: 2.84 V
Brady Statistic AP VP Percent: 3.14 %
Brady Statistic AP VS Percent: 0.01 %
Brady Statistic AS VP Percent: 96.8 %
Brady Statistic AS VS Percent: 0.04 %
Brady Statistic RA Percent Paced: 3.15 %
Brady Statistic RV Percent Paced: 99.91 %
Date Time Interrogation Session: 20250414073751
Implantable Lead Connection Status: 753985
Implantable Lead Connection Status: 753985
Implantable Lead Implant Date: 20070319
Implantable Lead Implant Date: 20070319
Implantable Lead Location: 753859
Implantable Lead Location: 753860
Implantable Lead Model: 5076
Implantable Lead Model: 5076
Implantable Pulse Generator Implant Date: 20160210
Lead Channel Impedance Value: 285 Ohm
Lead Channel Impedance Value: 418 Ohm
Lead Channel Impedance Value: 475 Ohm
Lead Channel Impedance Value: 475 Ohm
Lead Channel Pacing Threshold Amplitude: 1.25 V
Lead Channel Pacing Threshold Amplitude: 1.5 V
Lead Channel Pacing Threshold Pulse Width: 0.4 ms
Lead Channel Pacing Threshold Pulse Width: 0.4 ms
Lead Channel Sensing Intrinsic Amplitude: 3 mV
Lead Channel Sensing Intrinsic Amplitude: 3 mV
Lead Channel Sensing Intrinsic Amplitude: 4.5 mV
Lead Channel Sensing Intrinsic Amplitude: 4.5 mV
Lead Channel Setting Pacing Amplitude: 2.5 V
Lead Channel Setting Pacing Amplitude: 3 V
Lead Channel Setting Pacing Pulse Width: 0.4 ms
Lead Channel Setting Sensing Sensitivity: 2 mV
Zone Setting Status: 755011
Zone Setting Status: 755011

## 2023-09-16 NOTE — Patient Instructions (Signed)
 Medication Instructions:  Your physician recommends that you continue on your current medications as directed. Please refer to the Current Medication list given to you today.  *If you need a refill on your cardiac medications before your next appointment, please call your pharmacy*  Lab Work: None ordered If you have labs (blood work) drawn today and your tests are completely normal, you will receive your results only by: MyChart Message (if you have MyChart) OR A paper copy in the mail If you have any lab test that is abnormal or we need to change your treatment, we will call you to review the results.  Follow-Up: At [facility], you and your health needs are our priority.  As part of our continuing mission to provide you with exceptional heart care, our providers are all part of one team.  This team includes your primary Cardiologist (physician) and Advanced Practice Providers or APPs (Physician Assistants and Nurse Practitioners) who all work together to provide you with the care you need, when you need it.  Your next appointment:   1 month(s)  Provider:   Canary Brim, NP       1st Floor: - Lobby - Registration  - Pharmacy  - Lab - Cafe  2nd Floor: - PV Lab - Diagnostic Testing (echo, CT, nuclear med)  3rd Floor: - Vacant  4th Floor: - TCTS (cardiothoracic surgery) - AFib Clinic - Structural Heart Clinic - Vascular Surgery  - Vascular Ultrasound  5th Floor: - HeartCare Cardiology (general and EP) - Clinical Pharmacy for coumadin, hypertension, lipid, weight-loss medications, and med management appointments    Valet parking services will be available as well.

## 2023-09-25 ENCOUNTER — Encounter: Payer: Self-pay | Admitting: Internal Medicine

## 2023-09-26 ENCOUNTER — Ambulatory Visit
Admission: RE | Admit: 2023-09-26 | Discharge: 2023-09-26 | Disposition: A | Discharge status: Home / Self Care | Source: Ambulatory Visit | Attending: Family Medicine | Admitting: Family Medicine

## 2023-09-26 DIAGNOSIS — Z1231 Encounter for screening mammogram for malignant neoplasm of breast: Secondary | ICD-10-CM | POA: Diagnosis present

## 2023-09-26 NOTE — Progress Notes (Signed)
 Remote pacemaker transmission.

## 2023-09-29 ENCOUNTER — Telehealth: Payer: Self-pay | Admitting: Cardiology

## 2023-09-29 NOTE — Telephone Encounter (Signed)
   Pre-operative Risk Assessment    Patient Name: Angela [name]  DOB: 06-22-1970 MRN: [id number]   Date of last office visit: unknown Date of next office visit: unknown   Request for Surgical Clearance    Procedure:   NeuroSurgery and spine   Date of Surgery:  Clearance TBD                                Surgeon:  Dr. Arvilla [place] Surgeon's Group or Practice Name:  [facility] Phone number:  [phone] exy [phone] Fax number:  [phone]   Type of Clearance Requested:   - Medical    Type of Anesthesia:  General    Additional requests/questions:    SignedGeroldine Kotyk   09/29/2023, 10:44 AM

## 2023-09-29 NOTE — Telephone Encounter (Signed)
 Preoperative team, please contact requesting office and asked them for details surrounding procedure.  Once we know planned procedure and details surrounding surgery we will be able to provide recommendations from a cardiac standpoint.  Thank you for your help.  Chet Cota. Kapri Nero NP-C     09/29/2023, 11:37 AM [facility] [address] Office [phone] Fax [phone]

## 2023-09-30 ENCOUNTER — Encounter: Payer: Self-pay | Admitting: Family Medicine

## 2023-09-30 ENCOUNTER — Telehealth: Payer: Self-pay

## 2023-09-30 NOTE — Telephone Encounter (Signed)
   Pre-operative Risk Assessment    Patient Name: Angela [name]  DOB: 08/12/1970 MRN: [id number]   Date of last office visit: 09/16/2023, [name], NP Date of next office visit: 10/16/2023, [name], NP   Request for Surgical Clearance    Procedure:   Lumbar Laminectomy  Date of Surgery:  Clearance TBD                                Surgeon: Dr. Arvilla Birmingham Surgeon's Group or Practice Name: [facility] Phone number: [phone] Fax number: [phone]   Type of Clearance Requested:   - Medical  - Pharmacy:  Hold        Type of Anesthesia:  General    Additional requests/questions:    SignedDelroy Fields   09/30/2023, 10:45 AM

## 2023-10-06 NOTE — Telephone Encounter (Signed)
 Hi Brandi,  Patient was seen by you on 09/16/23 and is now pending lumbar laminectomy. The device team advised that she has time to have this procedure completed prior to ERI. Could you comment on medical clearance for upcoming surgery?  Please route your clearance to p cv div preop.  Thank you, Moira Andrews

## 2023-10-07 ENCOUNTER — Encounter: Payer: Self-pay | Admitting: Internal Medicine

## 2023-10-07 NOTE — Progress Notes (Signed)
 PERIOPERATIVE PRESCRIPTION FOR IMPLANTED CARDIAC DEVICE PROGRAMMING  Patient Information: Name:  Angela [name]  DOB:  Oct 18, 1970  MRN:  [id number]   Procedure:   Lumbar Laminectomy   Date of Surgery:  Clearance TBD                                  Surgeon: Dr. Arvilla Birmingham Surgeon's Group or Practice Name: [facility] Phone number: [phone] Fax number: [phone]  Device Information:  Clinic EP Physician:  Richardo Chandler, MD   Device Type:  Pacemaker Manufacturer and Phone #:  Medtronic: [id number] Pacemaker Dependent?:  Yes.   Date of Last Device Check:  09/16/2023 in office Normal Device Function?:  Yes.     *Patient is <1 month to ERI (with a guaranteed 3 months at that time remaining) [name], NP reviewed and clears patient for moving forward with procedure.   Electrophysiologist's Recommendations:  Have magnet available. Provide continuous ECG monitoring when magnet is used or reprogramming is to be performed.  Procedure will likely interfere with device function.  Device should be programmed:  Asynchronous pacing during procedure and returned to normal programming after procedure  Per Device Clinic Standing Orders, Candace Cerise, RN  5:05 PM 10/07/2023

## 2023-10-07 NOTE — Telephone Encounter (Signed)
 I have officially completed the preop clearance from Device.  Fax routed through [employer].

## 2023-10-08 ENCOUNTER — Other Ambulatory Visit: Payer: Self-pay | Admitting: Neurosurgery

## 2023-10-10 ENCOUNTER — Encounter: Payer: Self-pay | Admitting: Internal Medicine

## 2023-10-10 ENCOUNTER — Encounter: Payer: Self-pay | Admitting: *Deleted

## 2023-10-10 NOTE — Progress Notes (Signed)
 PERIOPERATIVE PRESCRIPTION FOR IMPLANTED CARDIAC DEVICE PROGRAMMING  Patient Information: Name:  Angela [name]  DOB:  [birth year]  MRN:  [id number]  Planned Procedure:  Laminectomy for facet/synovial cyst resection bilateral foraminotomies, lumbar 4- lumbar 5  Surgeon:  Dr. Arvilla Birmingham  Date of Procedure:  10/21/23  Cautery will be used.  Position during surgery:  Prone   Please send documentation back to:  Arlin Benes (Fax # [phone])   Device Information:  Clinic EP Physician:  Clinton Danas, MD  Device Type:  Pacemaker Manufacturer and Phone #:  Medtronic: [id number] Pacemaker Dependent?:  No. Date of Last Device Check:  09/30/23 Normal Device Function?:  Yes.    Electrophysiologist's Recommendations:  Have magnet available. Provide continuous ECG monitoring when magnet is used or reprogramming is to be performed.  Procedure will likely interfere with device function.  Device should be programmed:  Asynchronous pacing during procedure and returned to normal programming after procedure  Per Device Clinic Standing Orders, Mariella Shore, RN  1:22 PM 10/10/2023

## 2023-10-10 NOTE — Progress Notes (Signed)
 Surgical Instructions   Your procedure is scheduled on Tuesday, May 20th, 2025. Report to [facility] Main Entrance "A" at 10:15 A.M., then check in with the Admitting office. Any questions or running late day of surgery: call [phone]  Questions prior to your surgery date: call [phone], Monday-Friday, 8am-4pm. If you experience any cold or flu symptoms such as cough, fever, chills, shortness of breath, etc. between now and your scheduled surgery, please notify us  at the above number.     Remember:  Do not eat or drink after midnight the night before your surgery    Take these medicines the morning of surgery with A SIP OF WATER: Diltiazem  (Cardizem ) Docusate Sodium  (Colace) Gabapentin (Neurontin) Venlafaxine  XR (Effexor -XR)   May take these medicines IF NEEDED: Cyclobenzaprine (Flexeril) Lorazepam  (Ativan ) Tramadol (Ultram)    Tirzepatide should be held for 7 days prior to your surgery.  Your last dose should be on or before Monday, May 12th.    One week prior to surgery, STOP taking any Aspirin  (unless otherwise instructed by your surgeon) Aleve, Naproxen, Ibuprofen, Motrin, Advil, Goody's, BC's, all herbal medications, fish oil, and non-prescription vitamins.                     Do NOT Smoke (Tobacco/Vaping) for 24 hours prior to your procedure.  If you use a CPAP at night, you may bring your mask/headgear for your overnight stay.   You will be asked to remove any contacts, glasses, piercing's, hearing aid's, dentures/partials prior to surgery. Please bring cases for these items if needed.    Patients discharged the day of surgery will not be allowed to drive home, and someone needs to stay with them for 24 hours.  SURGICAL WAITING ROOM VISITATION Patients may have no more than 2 support people in the waiting area - these visitors may rotate.   Pre-op nurse will coordinate an appropriate time for 1 ADULT support person, who may not rotate, to accompany patient  in pre-op.  Children under the age of 7 must have an adult with them who is not the patient and must remain in the main waiting area with an adult.  If the patient needs to stay at the hospital during part of their recovery, the visitor guidelines for inpatient rooms apply.  Please refer to the [facility] website for the visitor guidelines for any additional information.   If you received a COVID test during your pre-op visit  it is requested that you wear a mask when out in public, stay away from anyone that may not be feeling well and notify your surgeon if you develop symptoms. If you have been in contact with anyone that has tested positive in the last 10 days please notify you surgeon.      Pre-operative 5 CHG Bathing Instructions   You can play a key role in reducing the risk of infection after surgery. Your skin needs to be as free of germs as possible. You can reduce the number of germs on your skin by washing with CHG (chlorhexidine  gluconate) soap before surgery. CHG is an antiseptic soap that kills germs and continues to kill germs even after washing.   DO NOT use if you have an allergy to chlorhexidine /CHG or antibacterial soaps. If your skin becomes reddened or irritated, stop using the CHG and notify one of our RNs at [phone].   Please shower with the CHG soap starting 4 days before surgery using the following schedule:  Please keep in mind the following:  DO NOT shave, including legs and underarms, starting the day of your first shower.   You may shave your face at any point before/day of surgery.  Place clean sheets on your bed the day you start using CHG soap. Use a clean washcloth (not used since being washed) for each shower. DO NOT sleep with pets once you start using the CHG.   CHG Shower Instructions:  Wash your face and private area with normal soap. If you choose to wash your hair, wash first with your normal shampoo.  After you use shampoo/soap,  rinse your hair and body thoroughly to remove shampoo/soap residue.  Turn the water OFF and apply about 3 tablespoons (45 ml) of CHG soap to a CLEAN washcloth.  Apply CHG soap ONLY FROM YOUR NECK DOWN TO YOUR TOES (washing for 3-5 minutes)  DO NOT use CHG soap on face, private areas, open wounds, or sores.  Pay special attention to the area where your surgery is being performed.  If you are having back surgery, having someone wash your back for you may be helpful. Wait 2 minutes after CHG soap is applied, then you may rinse off the CHG soap.  Pat dry with a clean towel  Put on clean clothes/pajamas   If you choose to wear lotion, please use ONLY the CHG-compatible lotions that are listed below.  Additional instructions for the day of surgery: DO NOT APPLY any lotions, deodorants, cologne, or perfumes.   Do not bring valuables to the hospital. [facility] is not responsible for any belongings/valuables. Do not wear nail polish, gel polish, artificial nails, or any other type of covering on natural nails (fingers and toes) Do not wear jewelry or makeup Put on clean/comfortable clothes.  Please brush your teeth.  Ask your nurse before applying any prescription medications to the skin.     CHG Compatible Lotions   Aveeno Moisturizing lotion  Cetaphil Moisturizing Cream  Cetaphil Moisturizing Lotion  Clairol Herbal Essence Moisturizing Lotion, Dry Skin  Clairol Herbal Essence Moisturizing Lotion, Extra Dry Skin  Clairol Herbal Essence Moisturizing Lotion, Normal Skin  Curel Age Defying Therapeutic Moisturizing Lotion with Alpha Hydroxy  Curel Extreme Care Body Lotion  Curel Soothing Hands Moisturizing Hand Lotion  Curel Therapeutic Moisturizing Cream, Fragrance-Free  Curel Therapeutic Moisturizing Lotion, Fragrance-Free  Curel Therapeutic Moisturizing Lotion, Original Formula  Eucerin Daily Replenishing Lotion  Eucerin Dry Skin Therapy Plus Alpha Hydroxy Crme  Eucerin Dry Skin  Therapy Plus Alpha Hydroxy Lotion  Eucerin Original Crme  Eucerin Original Lotion  Eucerin Plus Crme Eucerin Plus Lotion  Eucerin TriLipid Replenishing Lotion  Keri Anti-Bacterial Hand Lotion  Keri Deep Conditioning Original Lotion Dry Skin Formula Softly Scented  Keri Deep Conditioning Original Lotion, Fragrance Free Sensitive Skin Formula  Keri Lotion Fast Absorbing Fragrance Free Sensitive Skin Formula  Keri Lotion Fast Absorbing Softly Scented Dry Skin Formula  Keri Original Lotion  Keri Skin Renewal Lotion Keri Silky Smooth Lotion  Keri Silky Smooth Sensitive Skin Lotion  Nivea Body Creamy Conditioning Oil  Nivea Body Extra Enriched Lotion  Nivea Body Original Lotion  Nivea Body Sheer Moisturizing Lotion Nivea Crme  Nivea Skin Firming Lotion  NutraDerm 30 Skin Lotion  NutraDerm Skin Lotion  NutraDerm Therapeutic Skin Cream  NutraDerm Therapeutic Skin Lotion  ProShield Protective Hand Cream  Provon moisturizing lotion  Please read over the following fact sheets that you were given.

## 2023-10-13 ENCOUNTER — Encounter (HOSPITAL_COMMUNITY): Payer: Self-pay

## 2023-10-13 ENCOUNTER — Other Ambulatory Visit: Payer: Self-pay

## 2023-10-13 ENCOUNTER — Encounter (HOSPITAL_COMMUNITY)
Admission: RE | Admit: 2023-10-13 | Discharge: 2023-10-13 | Disposition: A | Discharge status: Home / Self Care | Source: Ambulatory Visit | Attending: Neurosurgery | Admitting: Neurosurgery

## 2023-10-13 VITALS — BP 122/75 | HR 86 | Temp 97.5°F | Resp 16 | Ht 64.0 in | Wt 145.0 lb

## 2023-10-13 DIAGNOSIS — F429 Obsessive-compulsive disorder, unspecified: Secondary | ICD-10-CM | POA: Insufficient documentation

## 2023-10-13 DIAGNOSIS — M1990 Unspecified osteoarthritis, unspecified site: Secondary | ICD-10-CM | POA: Diagnosis not present

## 2023-10-13 DIAGNOSIS — I10 Essential (primary) hypertension: Secondary | ICD-10-CM | POA: Insufficient documentation

## 2023-10-13 DIAGNOSIS — Z79899 Other long term (current) drug therapy: Secondary | ICD-10-CM | POA: Insufficient documentation

## 2023-10-13 DIAGNOSIS — Z981 Arthrodesis status: Secondary | ICD-10-CM | POA: Diagnosis not present

## 2023-10-13 DIAGNOSIS — M7138 Other bursal cyst, other site: Secondary | ICD-10-CM | POA: Diagnosis not present

## 2023-10-13 DIAGNOSIS — Z950 Presence of cardiac pacemaker: Secondary | ICD-10-CM | POA: Insufficient documentation

## 2023-10-13 DIAGNOSIS — Z01812 Encounter for preprocedural laboratory examination: Secondary | ICD-10-CM | POA: Diagnosis present

## 2023-10-13 DIAGNOSIS — G901 Familial dysautonomia [Riley-Day]: Secondary | ICD-10-CM | POA: Diagnosis not present

## 2023-10-13 DIAGNOSIS — Z01818 Encounter for other preprocedural examination: Secondary | ICD-10-CM

## 2023-10-13 HISTORY — DX: Presence of cardiac pacemaker: Z95.0

## 2023-10-13 LAB — CBC
HCT: 43.1 % (ref 36.0–46.0)
Hemoglobin: 15 g/dL (ref 12.0–15.0)
MCH: 31.4 pg (ref 26.0–34.0)
MCHC: 34.8 g/dL (ref 30.0–36.0)
MCV: 90.2 fL (ref 80.0–100.0)
Platelets: 294 10*3/uL (ref 150–400)
RBC: 4.78 MIL/uL (ref 3.87–5.11)
RDW: 11.8 % (ref 11.5–15.5)
WBC: 4.9 10*3/uL (ref 4.0–10.5)
nRBC: 0 % (ref 0.0–0.2)

## 2023-10-13 LAB — BASIC METABOLIC PANEL WITH GFR
Anion gap: 8 (ref 5–15)
BUN: 11 mg/dL (ref 6–20)
CO2: 29 mmol/L (ref 22–32)
Calcium: 9.2 mg/dL (ref 8.9–10.3)
Chloride: 102 mmol/L (ref 98–111)
Creatinine, Ser: 0.91 mg/dL (ref 0.44–1.00)
GFR, Estimated: >60 mL/min (ref >60)
Glucose, Bld: 86 mg/dL (ref 70–99)
Potassium: 3.9 mmol/L (ref 3.5–5.1)
Sodium: 139 mmol/L (ref 135–145)

## 2023-10-13 LAB — SURGICAL PCR SCREEN
MRSA, PCR: NEGATIVE
Staphylococcus aureus: POSITIVE — AB

## 2023-10-13 NOTE — Telephone Encounter (Signed)
 This has already been addressed, see separate encounter.

## 2023-10-13 NOTE — Progress Notes (Signed)
 PCP - Dr. Terre Ferri EP Cardiologist: Dr. Richardo Chandler Cardiologist - Dr. Constancia Delton  PPM/ICD - Pacemaker, Medtronic Device Orders - received and on chart Rep Notified - Yes, Logan and Ivan  Chest x-ray - na EKG - 03/25/2023 Stress Test - 2007 per pateint ECHO - 01/29/2023 Cardiac Cath -   Sleep Study -  denies CPAP - na  Non-diabetic  Last dose of GLP1 agonist-  Tirzepatide for weight loss GLP1 instructions: Hold 7 days, states last dose 10/10/2023  Blood Thinner Instructions:denies Aspirin  Instructions:denies  ERAS Protcol -NPO  Anesthesia review: Yes, pacemaker, HTN, complete heart block  Patient denies shortness of breath, fever, cough and chest pain at PAT appointment   All instructions explained to the patient, with a verbal understanding of the material. Patient agrees to go over the instructions while at home for a better understanding. Patient also instructed to self quarantine after being tested for COVID-19. The opportunity to ask questions was provided.

## 2023-10-13 NOTE — Progress Notes (Signed)
 PERIOPERATIVE PRESCRIPTION FOR IMPLANTED CARDIAC DEVICE PROGRAMMING   Patient Information: Name:  Angela [name]  DOB:  [birth year]  MRN:  [id number]  Planned Procedure:  Laminectomy for facet/synovial cyst resection bilateral foraminotomies, lumbar 4- lumbar 5  Surgeon:  Dr. Arvilla Birmingham  Date of Procedure:  10/21/23  Cautery will be used.  Position during surgery:  Prone   Please send documentation back to:  Arlin Benes (Fax # [phone])    Device Information:   Clinic EP Physician:  Clinton Danas, MD   Device Type:  Pacemaker Manufacturer and Phone #:  Medtronic: [phone] Pacemaker Dependent?:  YES Date of Last Device Check:  09/30/23           Normal Device Function?:  Yes.     Electrophysiologist's Recommendations:   Have magnet available. Provide continuous ECG monitoring when magnet is used or reprogramming is to be performed.  Procedure will likely interfere with device function.  Device should be programmed:  Asynchronous pacing during procedure and returned to normal programming after procedure   Per Device Clinic Standing Orders, Bridgette Campus A. Felipe Horton RN

## 2023-10-14 NOTE — Anesthesia Preprocedure Evaluation (Addendum)
 Anesthesia Evaluation  Patient identified by MRN, date of birth, ID band Patient awake    Reviewed: Allergy & Precautions, H&P , NPO status , Patient's Chart, lab work & pertinent test results  Airway Mallampati: II   Neck ROM: full    Dental   Pulmonary shortness of breath   breath sounds clear to auscultation       Cardiovascular hypertension, + dysrhythmias + pacemaker  Rhythm:regular Rate:Normal  Echo 01/29/23: IMPRESSIONS  1. Left ventricular ejection fraction, by estimation, is 55 to 60%. Left  ventricular ejection fraction by 3D volume is 45 %. The left ventricle has normal function. The left ventricle has no regional wall motion  abnormalities. Left ventricular diastolic parameters are indeterminate.  2. Right ventricular systolic function is normal. The right ventricular  size is normal. There is normal pulmonary artery systolic pressure. The estimated right ventricular systolic pressure is 25.8 mmHg.  3. The mitral valve is normal in structure. No evidence of mitral valve regurgitation. No evidence of mitral stenosis.  4. Tricuspid valve regurgitation is mild to moderate.  5. The aortic valve is tricuspid. Aortic valve regurgitation is not  visualized. No aortic stenosis is present.  6. The inferior vena cava is normal in size with greater than 50%  respiratory variability, suggesting right atrial pressure of 3 mmHg.       Neuro/Psych  PSYCHIATRIC DISORDERS  Depression     Neuromuscular disease    GI/Hepatic   Endo/Other    Renal/GU      Musculoskeletal  (+) Arthritis ,    Abdominal   Peds  Hematology   Anesthesia Other Findings   Reproductive/Obstetrics                             Anesthesia Physical Anesthesia Plan  ASA: 2  Anesthesia Plan: General   Post-op Pain Management:    Induction: Intravenous  PONV Risk Score and Plan: 3 and Ondansetron , Dexamethasone ,  Midazolam  and Treatment may vary due to age or medical condition  Airway Management Planned: Oral ETT  Additional Equipment:   Intra-op Plan:   Post-operative Plan: Extubation in OR  Informed Consent: I have reviewed the patients History and Physical, chart, labs and discussed the procedure including the risks, benefits and alternatives for the proposed anesthesia with the patient or authorized representative who has indicated his/her understanding and acceptance.     Dental advisory given  Plan Discussed with: CRNA, Anesthesiologist and Surgeon  Anesthesia Plan Comments: (PAT note written 10/14/2023 by Allison Zelenak, PA-C. She has a Medtronic PPM and is pacer dependent. PPM near ERI (estimated 2 months longevity as of 09/15/23). Has EP preoperative clearance.   )        Anesthesia Quick Evaluation

## 2023-10-14 NOTE — Progress Notes (Unsigned)
  Electrophysiology Office Note:   Date:  10/16/2023  ID:  ALIVIANA EWY, DOB Jan 11, 1971, MRN [id number]  Primary Cardiologist: Constancia Delton, MD Primary Heart Failure: None Electrophysiologist: Will Cortland Ding, MD       History of Present Illness:   Angela [name] is a 53 y.o. 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   Since last being seen in our clinic the patient reports she is pending her laminectomy and synovial cyst removal per Dr. Andy Bannister.  She is anxious to get her back surgery completed and her generator change done so she can go see her daughter without having to worry about her battery on her PPM. She is sad to change providers as she has been with Dr. Rodolfo Clan for 18 years.    She denies chest pain, palpitations, dyspnea, PND, orthopnea, nausea, vomiting, dizziness, syncope, edema, weight gain, or early satiety.   Review of systems complete and found to be negative unless listed in HPI.   EP Information / Studies Reviewed:    EKG is not ordered today. EKG from 03/25/2023 reviewed which showed ASVP 75 bpm      PPM Interrogation-  reviewed in detail today,  See PACEART report.  Device History: Medtronic Dual Chamber PPM implanted 08/19/05 for CHB Generator Change: 07/13/2014 Dependent  Studies:  ECHO 03/2020 > LVEF 55-60%    Arrhythmia / AAD AT  CHB           Physical Exam:   VS:  BP 110/66   Pulse 85   Ht 5\' 4"  (1.626 m)   Wt 142 lb 12.8 oz (64.8 kg)   LMP 09/10/2021 (Within Days)   SpO2 99%   BMI 24.51 kg/m    Wt Readings from Last 3 Encounters:  10/16/23 142 lb 12.8 oz (64.8 kg)  10/13/23 145 lb (65.8 kg)  09/16/23 150 lb 3.2 oz (68.1 kg)     GEN: Well nourished, well developed in no acute distress NECK: No JVD; No carotid bruits CARDIAC: Regular rate and rhythm, no murmurs, rubs, gallops RESPIRATORY:  Clear to  auscultation without rales, wheezing or rhonchi  ABDOMEN: Soft, non-tender, non-distended EXTREMITIES:  No edema; No deformity   ASSESSMENT AND PLAN:    CHB s/p Medtronic PPM  Intolerant of prior device programming of DDD - DDI  -Normal PPM function -See Pace Art report -No changes today / no episodes on interrogation -at RRT as of 10/11/23, plan for generator change with Dr. Lawana Pray on 10/29/23 -instructions reviewed with patient for procedure > this is her 3rd generator change, she has a few escape beats but not consistent on checks (did not have any on my last check)   Hypertension  -well controlled on current regimen     Disposition:   Follow up with Dr. Lawana Pray (transitioning from Dr. Rodolfo Clan) as planned for generator change.    Signed, [name], NP-C, AGACNP-BC [facility] - Electrophysiology  10/16/2023, 11:08 AM

## 2023-10-14 NOTE — H&P (View-Only) (Signed)
  Electrophysiology Office Note:   Date:  10/16/2023  ID:  Angela [name], DOB Sep 12, 1970, MRN [id number]  Primary Cardiologist: Constancia Delton, MD Primary Heart Failure: None Electrophysiologist: Will Cortland Ding, MD       History of Present Illness:   Angela [name] is a 53 y.o. 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   Since last being seen in our clinic the patient reports she is pending her laminectomy and synovial cyst removal per Dr. Andy Bannister.  She is anxious to get her back surgery completed and her generator change done so she can go see her daughter without having to worry about her battery on her PPM. She is sad to change providers as she has been with Dr. Rodolfo Clan for 18 years.    She denies chest pain, palpitations, dyspnea, PND, orthopnea, nausea, vomiting, dizziness, syncope, edema, weight gain, or early satiety.   Review of systems complete and found to be negative unless listed in HPI.   EP Information / Studies Reviewed:    EKG is not ordered today. EKG from 03/25/2023 reviewed which showed ASVP 75 bpm      PPM Interrogation-  reviewed in detail today,  See PACEART report.  Device History: Medtronic Dual Chamber PPM implanted 08/19/05 for CHB Generator Change: 07/13/2014 Dependent  Studies:  ECHO 03/2020 > LVEF 55-60%    Arrhythmia / AAD AT  CHB           Physical Exam:   VS:  BP 110/66   Pulse 85   Ht 5\' 4"  (1.626 m)   Wt 142 lb 12.8 oz (64.8 kg)   LMP 09/10/2021 (Within Days)   SpO2 99%   BMI 24.51 kg/m    Wt Readings from Last 3 Encounters:  10/16/23 142 lb 12.8 oz (64.8 kg)  10/13/23 145 lb (65.8 kg)  09/16/23 150 lb 3.2 oz (68.1 kg)     GEN: Well nourished, well developed in no acute distress NECK: No JVD; No carotid bruits CARDIAC: Regular rate and rhythm, no murmurs, rubs, gallops RESPIRATORY:  Clear to  auscultation without rales, wheezing or rhonchi  ABDOMEN: Soft, non-tender, non-distended EXTREMITIES:  No edema; No deformity   ASSESSMENT AND PLAN:    CHB s/p Medtronic PPM  Intolerant of prior device programming of DDD - DDI  -Normal PPM function -See Pace Art report -No changes today / no episodes on interrogation -at RRT as of 10/11/23, plan for generator change with Dr. Lawana Pray on 10/29/23 -instructions reviewed with patient for procedure > this is her 3rd generator change, she has a few escape beats but not consistent on checks (did not have any on my last check)   Hypertension  -well controlled on current regimen     Disposition:   Follow up with Dr. Lawana Pray (transitioning from Dr. Rodolfo Clan) as planned for generator change.    Signed, [name], NP-C, AGACNP-BC [facility] - Electrophysiology  10/16/2023, 11:08 AM

## 2023-10-14 NOTE — Progress Notes (Signed)
 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   She had EP follow-up with [name], NP on 09/16/23. Normal PPM function. No changes made. One month follow-up planned as her device is near ERI (2 month longevity as of 09/15/23 transmission). There is subsequent communication by Slater Duncan, NP indicating that the device team advised that patient does have time to undergo lumbar laminectomy in May prior to ERI. [name] added, "Based on patients RCRI of <1%, she can move forward with her laminectomy without further testing."   EP Perioperative PPM Recommendations: Device Information: Clinic EP Physician:  Clinton Danas, MD Device Type:  Pacemaker Manufacturer and Phone #:  Medtronic: [phone] Pacemaker Dependent?:  No. Date of Last Device Check:  09/30/23           Normal Device Function?:  Yes.     Electrophysiologist's Recommendations: Have magnet available. Provide continuous ECG monitoring when magnet is used or reprogramming is to be performed.  Procedure will likely interfere with device function.  Device should be programmed:  Asynchronous  pacing during procedure and returned to normal programming after procedure  Last Tirzepatide 10/10/23 and instructed to hold for 7 days prior to surgery.   Anesthesia team to evaluate on the day of surgery.  VS: BP 122/75   Pulse 86   Temp (!) 36.4 C   Resp 16   Ht 5\' 4"  (1.626 m)   Wt 65.8 kg   LMP 09/10/2021 (Within Days)   SpO2 100%   BMI 24.89 kg/m    PROVIDERS: Solomon Dupre, DO is PCP  - Constancia Delton, MD is cardiologist - Richardo Chandler, MD is EP cardiologist - Larrie Po, MD is an rheumatologist - Mikki Alexander, MD is vascular surgeon. Evaluated 06/30/20 for "blue toes". Normal triphasic ABI's > 1 with somewhat dampened digital tracings felt more suggestive of some component of vasospasm like Raynaud's disease rather than atheroembolic disease. Daily ASA thought to be a "reasonable option" with compression socks, staying active, elevating legs when possible, and avoiding cold stimulation recommended.  As needed vascular surgery follow-up recommended.   LABS: Labs reviewed: Acceptable for surgery. LFTs normal 08/18/23.  (all labs ordered are listed, but only abnormal results are displayed)  Labs Reviewed  SURGICAL PCR SCREEN - Abnormal; Notable for the following components:      Result Value   Staphylococcus aureus POSITIVE (*)    All other components within normal limits  BASIC METABOLIC PANEL WITH GFR  CBC     IMAGES: MRI L-spine 09/02/23: IMPRESSION: 1. L4-5: Bilateral facet arthropathy with facet and ligamentous hypertrophy and edematous change. Small joint effusions. Synovial cyst within the posterior midline of the spinal canal extending over a length of 17 mm with a maximal diameter  of 7 mm. Bulging of the disc. These factors combine to result in multifactorial stenosis that could cause neural compression on either or both sides. The findings in general would likely relate to regional back pain. 2. L5-S1: Endplate osteophytes and bulging of the disc.  Mild facet degeneration left worse than right. No compressive canal or foraminal narrowing. The facet arthritis could possibly be painful. 3. L2-3: Disc bulge more prominent in the right foraminal region but without visible neural compression. 4. T11-12: Shallow right posterolateral disc herniation without visible neural compression.   MRI T-spine 09/02/23: IMPRESSION: 1. No advanced finding. No compression fracture. 2. Disc degeneration throughout the thoracic region as outlined above. No compressive central canal stenosis. No compressive foraminal stenosis. 3. Discogenic endplate marrow edema at T8-9 which could be painful. 4. Facet osteoarthritis in the thoracic region which could contribute to regional pain. This is most severe at T10-11, worse on the right, which could be painful.   EKG: 03/25/23: Atrial-sensed ventricular-paced rhythm When compared with ECG of 30-Dec-2022 13:40, Vent. rate has decreased BY 15 BPM   CV: Echo 01/29/23: IMPRESSIONS   1. Left ventricular ejection fraction, by estimation, is 55 to 60%. Left  ventricular ejection fraction by 3D volume is 45 %. The left ventricle has normal function. The left ventricle has no regional wall motion  abnormalities. Left ventricular diastolic  parameters are indeterminate.   2. Right ventricular systolic function is normal. The right ventricular  size is normal. There is normal pulmonary artery systolic pressure. The estimated right ventricular systolic pressure is 25.8 mmHg.   3. The mitral valve is normal in structure. No evidence of mitral valve regurgitation. No evidence of mitral stenosis.   4. Tricuspid valve regurgitation is mild to moderate.   5. The aortic valve is tricuspid. Aortic valve regurgitation is not  visualized. No aortic stenosis is present.   6. The inferior vena cava is normal in size with greater than 50%  respiratory variability, suggesting right atrial pressure of 3 mmHg.    CT Cardiac  Calcium Scoring 10/04/22: IMPRESSION AND RECOMMENDATION: 1. Normal coronary calcium score of 0. 2. CAC 0, CAC-DRS A0. 3. Continue heart healthy lifestyle and risk factor modification.    Cardiac cath 07/29/08: ASSESSMENT:  1. Normal coronary arteries.  2. Normal left ventricular function. LVEF 50-55 with no regional wall motion abnormalities.     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 EF 50-55% normal coronaries   Complete heart block (HCC)    Depression    Dysautonomia (HCC)    Dyspnea    CT negative for PE, 2007   Hypertension    Insomnia    OCD (obsessive compulsive disorder)    Presence of permanent cardiac pacemaker    SVT (supraventricular tachycardia) (HCC)    s/p ablation a. c/b AV nod ablation requiring pacemaker    Past Surgical History:  Procedure Laterality Date   ANTERIOR CERVICAL DECOMP/DISCECTOMY FUSION N/A 09/20/2021   Procedure: Anterior Cervical Discectomy Fusion  Cervical Four-Five, Cervical Five-Six, Cervical Six-Seven;  Surgeon: Dawley, Colby Daub, DO;  Location: [facility];  Service: Neurosurgery;  Laterality: N/A;  3C   BREAST BIOPSY Right 1998   benign   BREAST BIOPSY Right 2004   benign   BREAST LUMPECTOMY     RIGHT -BREAST    COLONOSCOPY WITH PROPOFOL  N/A  09/07/2019   Procedure: COLONOSCOPY WITH PROPOFOL ;  Surgeon: Marnee Sink, MD;  Location: [facility] ENDOSCOPY;  Service: Endoscopy;  Laterality: N/A;   ENDOMETRIAL ABLATION  2005   INSERT / REPLACE / REMOVE PACEMAKER  2007   PELVIC LAPAROSCOPY  1990   PERMANENT PACEMAKER GENERATOR CHANGE N/A 07/13/2014   MDT MRI compatible dual chamber pacemaker implanted by Dr Rodolfo Clan   POSTERIOR CERVICAL FUSION/FORAMINOTOMY N/A 11/05/2022   Procedure: POSTERIOR CERVICAL INSTRUMENTATION AND FUSION CERVICAL SIX-THORACIC ONE;  Surgeon: Pincus Bridgeman, DO;  Location: [facility];  Service: Neurosurgery;   Laterality: N/A;  3C   SPARK  2005   TUBAL LIGATION  2003    MEDICATIONS:  cyclobenzaprine (FLEXERIL) 5 MG tablet   diltiazem  (CARDIZEM  CD) 120 MG 24 hr capsule   docusate sodium  (COLACE) 100 MG capsule   FIBER ADULT GUMMIES PO   gabapentin (NEURONTIN) 300 MG capsule   hydroxychloroquine  (PLAQUENIL ) 200 MG tablet   LORazepam  (ATIVAN ) 0.5 MG tablet   losartan -hydrochlorothiazide (HYZAAR) 50-12.5 MG tablet   methocarbamol  (ROBAXIN ) 500 MG tablet   Multiple Vitamins-Minerals (MULTIVITAMIN WITH MINERALS) tablet   tirzepatide 7.5 MG/0.5ML injection vial   traMADol (ULTRAM) 50 MG tablet   traZODone  (DESYREL ) 50 MG tablet   venlafaxine  XR (EFFEXOR -XR) 150 MG 24 hr capsule   No current facility-administered medications for this encounter.    Ella Gun, PA-C Surgical Short Stay/Anesthesiology [facility] Phone [phone] [facility] Phone [phone] 10/14/2023 3:33 PM

## 2023-10-16 ENCOUNTER — Ambulatory Visit: Attending: Pulmonary Disease | Admitting: Pulmonary Disease

## 2023-10-16 ENCOUNTER — Encounter: Payer: Self-pay | Admitting: Pulmonary Disease

## 2023-10-16 ENCOUNTER — Ambulatory Visit (INDEPENDENT_AMBULATORY_CARE_PROVIDER_SITE_OTHER): Payer: Medicare PPO

## 2023-10-16 VITALS — BP 110/66 | HR 85 | Ht 64.0 in | Wt 142.8 lb

## 2023-10-16 DIAGNOSIS — I4719 Other supraventricular tachycardia: Secondary | ICD-10-CM | POA: Diagnosis not present

## 2023-10-16 DIAGNOSIS — I442 Atrioventricular block, complete: Secondary | ICD-10-CM

## 2023-10-16 DIAGNOSIS — Z950 Presence of cardiac pacemaker: Secondary | ICD-10-CM | POA: Diagnosis not present

## 2023-10-16 DIAGNOSIS — I10 Essential (primary) hypertension: Secondary | ICD-10-CM | POA: Diagnosis not present

## 2023-10-16 LAB — CUP PACEART INCLINIC DEVICE CHECK
Battery Remaining Longevity: 1 mo
Battery Voltage: 2.83 V
Brady Statistic AP VP Percent: 5.57 %
Brady Statistic AP VS Percent: 0 %
Brady Statistic AS VP Percent: 94.42 %
Brady Statistic AS VS Percent: 0.01 %
Brady Statistic RA Percent Paced: 5.57 %
Brady Statistic RV Percent Paced: 99.97 %
Date Time Interrogation Session: 20250515104121
Implantable Lead Connection Status: 753985
Implantable Lead Connection Status: 753985
Implantable Lead Implant Date: 20070319
Implantable Lead Implant Date: 20070319
Implantable Lead Location: 753859
Implantable Lead Location: 753860
Implantable Lead Model: 5076
Implantable Lead Model: 5076
Implantable Pulse Generator Implant Date: 20160210
Lead Channel Impedance Value: 323 Ohm
Lead Channel Impedance Value: 437 Ohm
Lead Channel Impedance Value: 494 Ohm
Lead Channel Impedance Value: 494 Ohm
Lead Channel Pacing Threshold Amplitude: 1.125 V
Lead Channel Pacing Threshold Amplitude: 1.25 V
Lead Channel Pacing Threshold Pulse Width: 0.4 ms
Lead Channel Pacing Threshold Pulse Width: 0.4 ms
Lead Channel Sensing Intrinsic Amplitude: 17.625 mV
Lead Channel Sensing Intrinsic Amplitude: 2.625 mV
Lead Channel Sensing Intrinsic Amplitude: 2.75 mV
Lead Channel Sensing Intrinsic Amplitude: 3.375 mV
Lead Channel Setting Pacing Amplitude: 2.5 V
Lead Channel Setting Pacing Amplitude: 2.5 V
Lead Channel Setting Pacing Pulse Width: 0.4 ms
Lead Channel Setting Sensing Sensitivity: 2 mV
Zone Setting Status: 755011
Zone Setting Status: 755011

## 2023-10-16 NOTE — Patient Instructions (Signed)
 Medication Instructions:  NO CHANGES *If you need a refill on your cardiac medications before your next appointment, please call your pharmacy*  Lab Work: NO LABS If you have labs (blood work) drawn today and your tests are completely normal, you will receive your results only by: MyChart Message (if you have MyChart) OR A paper copy in the mail If you have any lab test that is abnormal or we need to change your treatment, we will call you to review the results.  Testing/Procedures: NO TESTING  Follow-Up: At [facility], you and your health needs are our priority.  As part of our continuing mission to provide you with exceptional heart care, our providers are all part of one team.  This team includes your primary Cardiologist (physician) and Advanced Practice Providers or APPs (Physician Assistants and Nurse Practitioners) who all work together to provide you with the care you need, when you need it.  Your next appointment:   FOLLOW UP WILL BE SCHEDULED AT LATER TIME

## 2023-10-19 ENCOUNTER — Ambulatory Visit: Payer: Self-pay | Admitting: Cardiology

## 2023-10-21 ENCOUNTER — Ambulatory Visit (HOSPITAL_COMMUNITY)

## 2023-10-21 ENCOUNTER — Encounter (HOSPITAL_COMMUNITY)
Admission: RE | Disposition: A | Discharge status: Home / Self Care | Payer: Self-pay | Source: Home / Self Care | Attending: Neurosurgery

## 2023-10-21 ENCOUNTER — Ambulatory Visit (HOSPITAL_COMMUNITY)
Admission: RE | Admit: 2023-10-21 | Discharge: 2023-10-21 | Disposition: A | Discharge status: Home / Self Care | Attending: Neurosurgery | Admitting: Neurosurgery

## 2023-10-21 ENCOUNTER — Encounter (HOSPITAL_COMMUNITY): Payer: Self-pay

## 2023-10-21 ENCOUNTER — Other Ambulatory Visit: Payer: Self-pay

## 2023-10-21 ENCOUNTER — Ambulatory Visit (HOSPITAL_COMMUNITY): Payer: Self-pay | Admitting: Vascular Surgery

## 2023-10-21 ENCOUNTER — Ambulatory Visit (HOSPITAL_BASED_OUTPATIENT_CLINIC_OR_DEPARTMENT_OTHER): Admitting: Certified Registered Nurse Anesthetist

## 2023-10-21 DIAGNOSIS — Z950 Presence of cardiac pacemaker: Secondary | ICD-10-CM | POA: Insufficient documentation

## 2023-10-21 DIAGNOSIS — Z79899 Other long term (current) drug therapy: Secondary | ICD-10-CM | POA: Insufficient documentation

## 2023-10-21 DIAGNOSIS — M7138 Other bursal cyst, other site: Secondary | ICD-10-CM | POA: Diagnosis present

## 2023-10-21 DIAGNOSIS — I10 Essential (primary) hypertension: Secondary | ICD-10-CM | POA: Diagnosis not present

## 2023-10-21 DIAGNOSIS — F32A Depression, unspecified: Secondary | ICD-10-CM | POA: Diagnosis not present

## 2023-10-21 DIAGNOSIS — I4891 Unspecified atrial fibrillation: Secondary | ICD-10-CM | POA: Diagnosis not present

## 2023-10-21 HISTORY — PX: DECOMPRESSIVE LUMBAR LAMINECTOMY LEVEL 1: SHX5791

## 2023-10-21 SURGERY — DECOMPRESSIVE LUMBAR LAMINECTOMY LEVEL 1
Anesthesia: General | Laterality: Bilateral

## 2023-10-21 MED ORDER — METHYLPREDNISOLONE ACETATE 80 MG/ML IJ SUSP
INTRAMUSCULAR | Status: DC | PRN
  Administered 2023-10-21: 80 mg

## 2023-10-21 MED ORDER — LIDOCAINE 2% (20 MG/ML) 5 ML SYRINGE
INTRAMUSCULAR | Status: DC | PRN
  Administered 2023-10-21: 60 mg via INTRAVENOUS

## 2023-10-21 MED ORDER — ONDANSETRON HCL 4 MG/2ML IJ SOLN
INTRAMUSCULAR | Status: DC | PRN
  Administered 2023-10-21: 4 mg via INTRAVENOUS

## 2023-10-21 MED ORDER — PHENYLEPHRINE HCL-NACL 20-0.9 MG/250ML-% IV SOLN
INTRAVENOUS | Status: DC | PRN
  Administered 2023-10-21: 20 ug/min via INTRAVENOUS

## 2023-10-21 MED ORDER — SUGAMMADEX SODIUM 200 MG/2ML IV SOLN
INTRAVENOUS | Status: DC | PRN
  Administered 2023-10-21: 150 mg via INTRAVENOUS

## 2023-10-21 MED ORDER — CEFAZOLIN SODIUM-DEXTROSE 2-4 GM/100ML-% IV SOLN
2.0000 g | INTRAVENOUS | Status: AC
  Administered 2023-10-21: 2 g via INTRAVENOUS
  Filled 2023-10-21: qty 100

## 2023-10-21 MED ORDER — THROMBIN 5000 UNITS EX SOLR: OROMUCOSAL | Status: DC | PRN

## 2023-10-21 MED ORDER — CHLORHEXIDINE GLUCONATE 0.12 % MT SOLN
15.0000 mL | Freq: Once | OROMUCOSAL | Status: AC
  Administered 2023-10-21: 15 mL via OROMUCOSAL
  Filled 2023-10-21: qty 15

## 2023-10-21 MED ORDER — CHLORHEXIDINE GLUCONATE CLOTH 2 % EX PADS
6.0000 | MEDICATED_PAD | Freq: Once | CUTANEOUS | Status: DC
Start: 2023-10-21 — End: 2023-10-21

## 2023-10-21 MED ORDER — METHYLPREDNISOLONE ACETATE 80 MG/ML IJ SUSP
INTRAMUSCULAR | Status: AC
  Filled 2023-10-21: qty 1

## 2023-10-21 MED ORDER — DOCUSATE SODIUM 100 MG PO CAPS
100.0000 mg | ORAL_CAPSULE | Freq: Two times a day (BID) | ORAL | 0 refills | Status: AC | PRN
Start: 2023-10-21 — End: 2024-10-20

## 2023-10-21 MED ORDER — FENTANYL CITRATE (PF) 250 MCG/5ML IJ SOLN
INTRAMUSCULAR | Status: DC | PRN
Start: 2023-10-21 — End: 2023-10-21
  Administered 2023-10-21: 100 ug via INTRAVENOUS

## 2023-10-21 MED ORDER — THROMBIN 5000 UNITS EX KIT
PACK | CUTANEOUS | Status: AC
Start: 2023-10-21 — End: ?
  Filled 2023-10-21: qty 1

## 2023-10-21 MED ORDER — FENTANYL CITRATE (PF) 250 MCG/5ML IJ SOLN
INTRAMUSCULAR | Status: AC
  Filled 2023-10-21: qty 5

## 2023-10-21 MED ORDER — MIDAZOLAM HCL 2 MG/2ML IJ SOLN
INTRAMUSCULAR | Status: AC
  Filled 2023-10-21: qty 2

## 2023-10-21 MED ORDER — PROPOFOL 10 MG/ML IV BOLUS
INTRAVENOUS | Status: DC | PRN
  Administered 2023-10-21: 150 mg via INTRAVENOUS

## 2023-10-21 MED ORDER — ROCURONIUM BROMIDE 10 MG/ML (PF) SYRINGE
PREFILLED_SYRINGE | INTRAVENOUS | Status: DC | PRN
  Administered 2023-10-21: 50 mg via INTRAVENOUS
  Administered 2023-10-21: 10 mg via INTRAVENOUS

## 2023-10-21 MED ORDER — CYCLOBENZAPRINE HCL 5 MG PO TABS: 5.0000 mg | ORAL_TABLET | Freq: Three times a day (TID) | ORAL | 0 refills | Status: AC | PRN

## 2023-10-21 MED ORDER — LIDOCAINE-EPINEPHRINE 1 %-1:100000 IJ SOLN
INTRAMUSCULAR | Status: DC | PRN
  Administered 2023-10-21: 8 mL

## 2023-10-21 MED ORDER — OXYCODONE HCL 5 MG/5ML PO SOLN: 5.0000 mg | Freq: Once | ORAL | Status: AC | PRN

## 2023-10-21 MED ORDER — OXYCODONE HCL 5 MG PO TABS
ORAL_TABLET | ORAL | Status: AC
  Filled 2023-10-21: qty 1

## 2023-10-21 MED ORDER — DEXAMETHASONE SODIUM PHOSPHATE 10 MG/ML IJ SOLN
INTRAMUSCULAR | Status: DC | PRN
  Administered 2023-10-21: 10 mg via INTRAVENOUS

## 2023-10-21 MED ORDER — BUPIVACAINE HCL 0.5 % IJ SOLN
INTRAMUSCULAR | Status: DC | PRN
  Administered 2023-10-21: 30 mL

## 2023-10-21 MED ORDER — OXYCODONE-ACETAMINOPHEN 5-325 MG PO TABS: 1.0000 | ORAL_TABLET | ORAL | 0 refills | Status: DC | PRN

## 2023-10-21 MED ORDER — 0.9 % SODIUM CHLORIDE (POUR BTL) OPTIME
TOPICAL | Status: DC | PRN
  Administered 2023-10-21: 1000 mL

## 2023-10-21 MED ORDER — PHENYLEPHRINE 80 MCG/ML (10ML) SYRINGE FOR IV PUSH (FOR BLOOD PRESSURE SUPPORT)
PREFILLED_SYRINGE | INTRAVENOUS | Status: DC | PRN
  Administered 2023-10-21 (×2): 160 ug via INTRAVENOUS
  Administered 2023-10-21: 80 ug via INTRAVENOUS

## 2023-10-21 MED ORDER — ORAL CARE MOUTH RINSE: 15.0000 mL | Freq: Once | OROMUCOSAL | Status: AC

## 2023-10-21 MED ORDER — MIDAZOLAM HCL 2 MG/2ML IJ SOLN
INTRAMUSCULAR | Status: DC | PRN
  Administered 2023-10-21: 2 mg via INTRAVENOUS

## 2023-10-21 MED ORDER — CHLORHEXIDINE GLUCONATE CLOTH 2 % EX PADS: 6.0000 | MEDICATED_PAD | Freq: Once | CUTANEOUS | Status: DC

## 2023-10-21 MED ORDER — BUPIVACAINE HCL (PF) 0.5 % IJ SOLN
INTRAMUSCULAR | Status: AC
  Filled 2023-10-21: qty 30

## 2023-10-21 MED ORDER — LACTATED RINGERS IV SOLN: INTRAVENOUS | Status: DC

## 2023-10-21 MED ORDER — OXYCODONE HCL 5 MG PO TABS
5.0000 mg | ORAL_TABLET | Freq: Once | ORAL | Status: AC | PRN
  Administered 2023-10-21: 5 mg via ORAL

## 2023-10-21 MED ORDER — LIDOCAINE-EPINEPHRINE 1 %-1:100000 IJ SOLN
INTRAMUSCULAR | Status: AC
  Filled 2023-10-21: qty 1

## 2023-10-21 SURGICAL SUPPLY — 48 items
BAG COUNTER SPONGE SURGICOUNT (BAG) ×1 IMPLANT
BAND RUBBER #18 3X1/16 STRL (MISCELLANEOUS) ×2 IMPLANT
BENZOIN TINCTURE PRP APPL 2/3 (GAUZE/BANDAGES/DRESSINGS) ×1 IMPLANT
BLADE CLIPPER SURG (BLADE) IMPLANT
BUR CARBIDE MATCH 3.0 (BURR) IMPLANT
BUR PRECISION FLUTE 5.0 (BURR) IMPLANT
CANISTER SUCTION 3000ML PPV (SUCTIONS) ×1 IMPLANT
DERMABOND ADVANCED .7 DNX12 (GAUZE/BANDAGES/DRESSINGS) IMPLANT
DRAPE LAPAROTOMY 100X72X124 (DRAPES) ×1 IMPLANT
DRAPE MICROSCOPE SLANT 54X150 (MISCELLANEOUS) ×1 IMPLANT
DRAPE SURG 17X23 STRL (DRAPES) ×1 IMPLANT
DRESSING MEPILEX FLEX 4X4 (GAUZE/BANDAGES/DRESSINGS) IMPLANT
DRSG OPSITE POSTOP 3X4 (GAUZE/BANDAGES/DRESSINGS) ×1 IMPLANT
DURAPREP 26ML APPLICATOR (WOUND CARE) ×1 IMPLANT
ELECTRODE BLADE INSULATED 4IN (ELECTROSURGICAL) ×1 IMPLANT
ELECTRODE REM PT RTRN 9FT ADLT (ELECTROSURGICAL) ×1 IMPLANT
GAUZE 4X4 16PLY ~~LOC~~+RFID DBL (SPONGE) IMPLANT
GLOVE BIO SURGEON STRL SZ7 (GLOVE) ×2 IMPLANT
GLOVE BIOGEL PI IND STRL 7.5 (GLOVE) ×3 IMPLANT
GLOVE ECLIPSE 7.5 STRL STRAW (GLOVE) ×1 IMPLANT
GLOVE EXAM NITRILE XL STR (GLOVE) IMPLANT
GOWN STRL REUS W/ TWL LRG LVL3 (GOWN DISPOSABLE) ×2 IMPLANT
GOWN STRL REUS W/ TWL XL LVL3 (GOWN DISPOSABLE) ×1 IMPLANT
GOWN STRL REUS W/TWL 2XL LVL3 (GOWN DISPOSABLE) IMPLANT
HEMOSTAT POWDER KIT SURGIFOAM (HEMOSTASIS) ×1 IMPLANT
KIT BASIN OR (CUSTOM PROCEDURE TRAY) ×1 IMPLANT
KIT TURNOVER KIT B (KITS) ×1 IMPLANT
NDL HYPO 22X1.5 SAFETY MO (MISCELLANEOUS) ×1 IMPLANT
NDL SPNL 18GX3.5 QUINCKE PK (NEEDLE) IMPLANT
NEEDLE HYPO 22X1.5 SAFETY MO (MISCELLANEOUS) ×1 IMPLANT
NEEDLE SPNL 18GX3.5 QUINCKE PK (NEEDLE) IMPLANT
NS IRRIG 1000ML POUR BTL (IV SOLUTION) ×1 IMPLANT
PACK LAMINECTOMY NEURO (CUSTOM PROCEDURE TRAY) ×1 IMPLANT
PAD ARMBOARD POSITIONER FOAM (MISCELLANEOUS) ×3 IMPLANT
PATTIES SURGICAL .5 X.5 (GAUZE/BANDAGES/DRESSINGS) ×1 IMPLANT
PATTIES SURGICAL 1X1 (DISPOSABLE) ×1 IMPLANT
SPIKE FLUID TRANSFER (MISCELLANEOUS) ×1 IMPLANT
SPONGE SURGIFOAM ABS GEL SZ50 (HEMOSTASIS) IMPLANT
SPONGE T-LAP 4X18 ~~LOC~~+RFID (SPONGE) IMPLANT
STRIP CLOSURE SKIN 1/2X4 (GAUZE/BANDAGES/DRESSINGS) ×1 IMPLANT
SUT MNCRL AB 4-0 PS2 18 (SUTURE) ×1 IMPLANT
SUT VIC AB 0 CT1 18XCR BRD8 (SUTURE) ×1 IMPLANT
SUT VIC AB 2-0 CP2 18 (SUTURE) ×1 IMPLANT
SYR 30ML SLIP (SYRINGE) ×1 IMPLANT
SYR 3ML LL SCALE MARK (SYRINGE) IMPLANT
TOWEL GREEN STERILE (TOWEL DISPOSABLE) ×1 IMPLANT
TOWEL GREEN STERILE FF (TOWEL DISPOSABLE) ×1 IMPLANT
WATER STERILE IRR 1000ML POUR (IV SOLUTION) ×1 IMPLANT

## 2023-10-21 NOTE — Anesthesia Procedure Notes (Addendum)
 Procedure Name: Intubation Date/Time: 10/21/2023 12:15 PM  Performed by: Rochelle Chu, CRNAPre-anesthesia Checklist: Patient identified, Emergency Drugs available, Suction available and Patient being monitored Patient Re-evaluated:Patient Re-evaluated prior to induction Oxygen Delivery Method: Circle system utilized Preoxygenation: Pre-oxygenation with 100% oxygen Induction Type: IV induction Ventilation: Mask ventilation without difficulty Laryngoscope Size: Glidescope and 3 Grade View: Grade I Tube type: Oral Tube size: 7.0 mm Number of attempts: 1 Airway Equipment and Method: Oral airway, Rigid stylet and Video-laryngoscopy Placement Confirmation: ETT inserted through vocal cords under direct vision, positive ETCO2 and breath sounds checked- equal and bilateral Secured at: 20 cm Tube secured with: Tape Dental Injury: Teeth and Oropharynx as per pre-operative assessment  Comments: Previous cervical fusion

## 2023-10-21 NOTE — Progress Notes (Addendum)
 Called medtronic, informed of surgery time.  1056: Cornelius Dill called from [employer],stated Guidell will be coming to see pt.

## 2023-10-21 NOTE — Transfer of Care (Signed)
 Immediate Anesthesia Transfer of Care Note  Patient: Angela [name]  Procedure(s) Performed: DECOMPRESSIVE LUMBAR LAMINECTOMY LUMBAR FOUR-FIVE (Bilateral)  Patient Location: PACU  Anesthesia Type:General  Level of Consciousness: drowsy and patient cooperative  Airway & Oxygen Therapy: Patient Spontanous Breathing  Post-op Assessment: Report given to RN, Post -op Vital signs reviewed and stable, and Patient moving all extremities X 4  Post vital signs: Reviewed and stable  Last Vitals:  Vitals Value Taken Time  BP 110/74 10/21/23 1358  Temp    Pulse 89 10/21/23 1359  Resp 17 10/21/23 1359  SpO2 98 % 10/21/23 1359  Vitals shown include unfiled device data.  Last Pain:  Vitals:   10/21/23 1043  TempSrc:   PainSc: 6          Complications: No notable events documented.

## 2023-10-21 NOTE — H&P (Signed)
 CC: leg pain  HPI:     Patient is a 53 y.o. female 1 year ago, was developing pain in her right posterior hip. Went to [facility], diagnosed with IT band syndrome. Had shots and PT. MRI hip did not show much degenerative disease. She has pain in her back, sometimes radiates to knees. Raising right leg up to get into the car, now favoring right leg. Gabapentin helped significantly with burning pain and pins and needles from lower back up to bra line. Taking 100 mg BID. have some numbness in right buttock. At this point, both legs are affected equally, and can barely climb up a flight of stairs. used to walk 4-5 miles per day.     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/2022   Cervical radiculopathy at C6 09/20/2021   Hypertension 03/02/2021   Blue toes 06/27/2020   Cervical spondylosis 05/01/2020   Primary osteoarthritis involving multiple joints 05/01/2020   Rectal polyp    Chronic constipation 10/14/2018   Mucous cyst of digit of left hand 08/13/2018   Low back pain 03/17/2017   OCD (obsessive compulsive disorder) 01/24/2016   Atrial fibrillation (HCC) 07/27/2015   Depression, recurrent (HCC) 11/23/2014   POTS (postural orthostatic tachycardia syndrome) 08/17/2013   Atrial tachycardia/SINUS tachycardia 06/24/2013   Dysautonomia (HCC)    Mitral valve disease 12/04/2011   AV BLOCK, COMPLETE 08/25/2008   Pacemaker-Medtronic 08/25/2008   Past Medical History:  Diagnosis Date   Arthritis 2016   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 EF 50-55% normal coronaries   Complete heart block (HCC)    Depression    Dysautonomia (HCC)    Dyspnea    CT negative for PE, 2007   Hypertension    Insomnia    OCD (obsessive compulsive disorder)    Presence of  permanent cardiac pacemaker    SVT (supraventricular tachycardia) (HCC)    s/p ablation a. c/b AV nod ablation requiring pacemaker    Past Surgical History:  Procedure Laterality Date   ANTERIOR CERVICAL DECOMP/DISCECTOMY FUSION N/A 09/20/2021   Procedure: Anterior Cervical Discectomy Fusion  Cervical Four-Five, Cervical Five-Six, Cervical Six-Seven;  Surgeon: Dawley, Colby Daub, DO;  Location[facility];  Service: Neurosurgery;  Laterality: N/A;  3C   BREAST BIOPSY Right 1998   benign   BREAST BIOPSY Right 2004   benign   BREAST LUMPECTOMY     RIGHT -BREAST    COLONOSCOPY WITH PROPOFOL  N/A 09/07/2019   Procedure: COLONOSCOPY WITH PROPOFOL ;  Surgeon: Marnee Sink, MD;  Location: [facility] ENDOSCOPY;  Service: Endoscopy;  Laterality: N/A;   ENDOMETRIAL ABLATION  2005   INSERT / REPLACE / REMOVE PACEMAKER  2007   PELVIC LAPAROSCOPY  1990   PERMANENT PACEMAKER GENERATOR CHANGE N/A 07/13/2014   MDT MRI compatible dual chamber pacemaker implanted by Dr Rodolfo Clan   POSTERIOR CERVICAL FUSION/FORAMINOTOMY N/A 11/05/2022   Procedure: POSTERIOR CERVICAL INSTRUMENTATION AND FUSION CERVICAL SIX-THORACIC ONE;  Surgeon: Pincus Bridgeman, DO;  Location[facility];  Service: Neurosurgery;  Laterality: N/A;  3C   SPARK  2005   TUBAL LIGATION  2003    Medications Prior to Admission  Medication Sig Dispense Refill Last Dose/Taking   cyclobenzaprine (FLEXERIL) 5 MG tablet Take 5 mg by mouth 3 (three)  times daily as needed for muscle spasms.   Past Week   diltiazem  (CARDIZEM  CD) 120 MG 24 hr capsule Take 1 capsule (120 mg total) by mouth daily. 90 capsule 3 10/20/2023   docusate sodium  (COLACE) 100 MG capsule Take 100 mg by mouth daily.   10/20/2023   FIBER ADULT GUMMIES PO Take 2 each by mouth daily. V[employer] Fusion Fiberwell   Past Week   gabapentin (NEURONTIN) 300 MG capsule Take 300 mg by mouth 2 (two) times daily.   10/21/2023   hydroxychloroquine  (PLAQUENIL ) 200 MG tablet Take 400 mg by mouth at bedtime.   10/20/2023    LORazepam  (ATIVAN ) 0.5 MG tablet TAKE 1 TO 2 TABLETS(0.5 TO 1 MG) BY MOUTH DAILY AS NEEDED FOR ANXIETY 45 tablet 1 Past Month   losartan -hydrochlorothiazide (HYZAAR) 50-12.5 MG tablet TAKE 1 TABLET BY MOUTH DAILY 90 tablet 1 10/21/2023   methocarbamol  (ROBAXIN ) 500 MG tablet Take 1 tablet (500 mg total) by mouth every 6 (six) hours as needed for muscle spasms. 120 tablet 3 Past Month   Multiple Vitamins-Minerals (MULTIVITAMIN WITH MINERALS) tablet Take 1 tablet by mouth daily.   Past Week   tirzepatide 7.5 MG/0.5ML injection vial Inject 7.5 mg into the skin once a week.   10/13/2023   traMADol (ULTRAM) 50 MG tablet Take 50 mg by mouth every 6 (six) hours as needed for severe pain (pain score 7-10).   Past Month   traZODone  (DESYREL ) 50 MG tablet Take 0.5-1 tablets (25-50 mg total) by mouth at bedtime as needed for sleep. (Patient taking differently: Take 25-50 mg by mouth at bedtime.) 30 tablet 3 10/20/2023   venlafaxine  XR (EFFEXOR -XR) 150 MG 24 hr capsule TAKE 1 CAPSULE(150 MG) BY MOUTH DAILY WITH BREAKFAST 90 capsule 1 10/20/2023   Allergies  Allergen Reactions   Toprol  Xl [Metoprolol  Succinate] Photosensitivity    Social History   Tobacco Use   Smoking status: Never   Smokeless tobacco: Never  Substance Use Topics   Alcohol use: No    Family History  Problem Relation Age of Onset   Anxiety disorder Mother    Arthritis Mother    Obesity Mother    Cervical cancer Mother    Hypertension Father    Cancer Father 79       COLON   Diabetes Father    Anxiety disorder Sister    Breast cancer Maternal Aunt        mastectomy   Cancer Maternal Aunt    Breast cancer Paternal Aunt    Cancer Paternal Aunt    Breast cancer Paternal Aunt    Dementia Maternal Grandmother    Hypertension Maternal Grandfather    Diabetes Paternal Grandmother    Breast cancer Paternal Grandmother    Arthritis Paternal Grandmother    Cancer Paternal Grandmother    Diabetes Paternal Grandfather    Cancer  Paternal Grandfather      Review of Systems Pertinent items are noted in HPI.  Objective:   Patient Vitals for the past 8 hrs:  BP Temp Temp src Pulse Resp SpO2 Height Weight  10/21/23 1016 118/88 97.7 F (36.5 C) Oral 91 18 99 % 5\' 4"  (1.626 m) 63.5 kg   No intake/output data recorded. No intake/output data recorded.      General : Alert, cooperative, no distress, appears stated age   Head:  Normocephalic/atraumatic    Eyes: PERRL, conjunctiva/corneas clear, EOM's intact. Fundi could not be visualized Neck: Supple Chest:  Respirations unlabored Chest wall: no  tenderness or deformity Heart: Regular rate and rhythm Abdomen: Soft, nontender and nondistended Extremities: warm and well-perfused Skin: normal turgor, color and texture Neurologic:  Alert, oriented x 3.  Eyes open spontaneously. PERRL, EOMI, VFC, no facial droop. V1-3 intact.  No dysarthria, tongue protrusion symmetric.  CNII-XII intact. Normal strength, sensation and reflexes throughout.  No pronator drift, full strength in legs.  + L SLR       Data ReviewCBC:  Lab Results  Component Value Date   WBC 4.9 10/13/2023   RBC 4.78 10/13/2023   BMP:  Lab Results  Component Value Date   GLUCOSE 86 10/13/2023   GLUCOSE 89 04/03/2012   CO2 29 10/13/2023   CO2 26 04/03/2012   BUN 11 10/13/2023   BUN 13 08/18/2023   BUN 8 04/03/2012   CREATININE 0.91 10/13/2023   CREATININE 0.97 04/03/2012   CALCIUM 9.2 10/13/2023   CALCIUM 9.4 04/03/2012   Radiology review: see clinic note for details  Assessment:   Active Problems:   * No active hospital problems. *  This is a 53 year old woman with a history of posterior cervical fusion who has a large L4-5 degenerative cyst causing moderate to severe stenosis. She has had radiculopathic symptoms with significant functional loss and difficultly with ambulation.I had a long discussion with the patient. I discussed with her treatment options. She has done physical therapy  for her ambulatory difficulties already so I do not think further physical therapy specifically for her lumbar spine will be of much benefit. Option of injection therapy was discussed but with the thighs persist, I also discussed the option of surgical resection via laminectomy. She does not have spondylolisthesis, severe disc degeneration, or severe facet arthropathy for which I would recommend a fusion at this time. I discussed with the patient the general technique of surgery, as well as risks, benefits, alternatives, and expected convalescence. Risks discussed included, but were not limited to, bleeding, pain, infection, scar, instability, recurrence, damage to nearby organs, spinal fluid leak, neurologic deficit, and death. Informed consent was obtained and the patient wished to proceed with surgery. All questions and concerns were answered.

## 2023-10-21 NOTE — Op Note (Signed)
 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 Arvilla Birmingham, MD  ASSISTANT: Easter Golden, Pa.  Please note, there were no qualified trainees available to assist with the procedure.     ANESTHESIA: General Endotracheal  EBL: 25 ml  SPECIMENS: None  DRAINS: None  COMPLICATIONS: none  CONDITION: Stable to PCAU  HISTORY: Angela [name] is a 53 y.o. female who initially presented to the outpatient clinic with bilateral lumbar radicular symptoms that were worsening . MRI showed a large degenerative/synovial cyst emanating from the left L4-5 joint but causing moderate to severe central stenosis.  . Treatment options were discussed and the patient elected to proceed with surgical decompression by laminectomy and foraminotomies and resection of synovial at L4-5.  PROCEDURE IN DETAIL: After informed consent was obtained and witnessed, the patient was brought to the operating room. After induction of general anesthesia, the patient was positioned on the operative table in the prone position with all pressure points meticulously padded. The skin of the low back was then prepped and draped in the usual sterile fashion.  Using x-ray and spinal needle, the L4-5 levels was identified and marked out on the skin, and after timeout was conducted, the skin was infiltrated with local anesthetic. Skin incision was then made sharply and Bovie electrocautery was used to dissect the subcutaneous tissue until the lumbodorsal fascia was identified. The fascia was then incised using Bovie electrocautery and the lamina at the L4-5 level was identified and dissection was carried out in the subperiosteal plane. Self-retaining retractor was then placed, and intraoperative x-ray was taken to confirm  we were at the correct levels.  Microscope was then introduced into the field to allow for intraoperative microdissection.  Interspinous ligament was removed with Leksell rongeur.  Using a high-speed drill, inferior lamina of L4 and superior lamina of L5  was removed, and bilateral medial facetectomies were performed. The ligamentum flavum was then identified and removed and the thecal sac was identified on the right side, noted to be under significant compression.  Large left synovial cyst with significant neovascularization and adherence to the thecal sac and traversing left L5 nerve root was identified.  The degenerative cyst was painstakingly dissected from the dura using combination of blunt and sharp microdissection. It was particulary adherent to the shoulder of the left L5 nerve root.  After removal of the cyst, the thecal sac appeared decompressed.  Kerrison rongeurs were then used to complete foraminotomies at L4-5 bilaterally. Good decompression was confirmed using a small ball tip dissector.  Hemostasis was then secured using a combination of morcellized Gelfoam and thrombin  and bipolar electrocautery. The wound was irrigated copiously.  Depo medrol  was placed over the thecal sac and nerve roots.  Marcaine  was injected into the paraspinous muscles and soft tissues.  Self-retaining retractor was then removed, and the wound is closed in layers using a combination of interrupted 0 Vicryl and 2-0 Vicryl stitches followed by 4-0 monocryl in subcuticular manner and Dermabond.  A sterile dressing was placed.   At the end of the case all sponge, needle, and instrument counts were correct. The patient was then transferred to the stretcher and taken to the postanesthesia care unit in stable hemodynamic condition.

## 2023-10-22 ENCOUNTER — Encounter (HOSPITAL_COMMUNITY): Payer: Self-pay | Admitting: Neurosurgery

## 2023-10-22 ENCOUNTER — Telehealth (HOSPITAL_COMMUNITY): Payer: Self-pay

## 2023-10-22 LAB — CUP PACEART REMOTE DEVICE CHECK
Battery Remaining Longevity: 1 mo
Battery Voltage: 2.83 V
Brady Statistic AP VP Percent: 5.89 %
Brady Statistic AP VS Percent: 0.01 %
Brady Statistic AS VP Percent: 94.03 %
Brady Statistic AS VS Percent: 0.07 %
Brady Statistic RA Percent Paced: 5.9 %
Brady Statistic RV Percent Paced: 99.89 %
Date Time Interrogation Session: 20250520150831
Implantable Lead Connection Status: 753985
Implantable Lead Connection Status: 753985
Implantable Lead Implant Date: 20070319
Implantable Lead Implant Date: 20070319
Implantable Lead Location: 753859
Implantable Lead Location: 753860
Implantable Lead Model: 5076
Implantable Lead Model: 5076
Implantable Pulse Generator Implant Date: 20160210
Lead Channel Impedance Value: 285 Ohm
Lead Channel Impedance Value: 418 Ohm
Lead Channel Impedance Value: 475 Ohm
Lead Channel Impedance Value: 475 Ohm
Lead Channel Pacing Threshold Amplitude: 1.125 V
Lead Channel Pacing Threshold Amplitude: 1.25 V
Lead Channel Pacing Threshold Pulse Width: 0.4 ms
Lead Channel Pacing Threshold Pulse Width: 0.4 ms
Lead Channel Sensing Intrinsic Amplitude: 18.75 mV
Lead Channel Sensing Intrinsic Amplitude: 2.5 mV
Lead Channel Sensing Intrinsic Amplitude: 2.875 mV
Lead Channel Sensing Intrinsic Amplitude: 3.25 mV
Lead Channel Setting Pacing Amplitude: 2.5 V
Lead Channel Setting Pacing Amplitude: 2.5 V
Lead Channel Setting Pacing Pulse Width: 0.4 ms
Lead Channel Setting Sensing Sensitivity: 2 mV
Zone Setting Status: 755011
Zone Setting Status: 755011

## 2023-10-22 NOTE — Anesthesia Postprocedure Evaluation (Signed)
 Anesthesia Post Note  Patient: Angela [name]  Procedure(s) Performed: DECOMPRESSIVE LUMBAR LAMINECTOMY LUMBAR FOUR-FIVE (Bilateral)     Patient location during evaluation: PACU Anesthesia Type: General Level of consciousness: awake and alert Pain management: pain level controlled Vital Signs Assessment: post-procedure vital signs reviewed and stable Respiratory status: spontaneous breathing, nonlabored ventilation, respiratory function stable and patient connected to nasal cannula oxygen Cardiovascular status: blood pressure returned to baseline and stable Postop Assessment: no apparent nausea or vomiting Anesthetic complications: no   No notable events documented.  Last Vitals:  Vitals:   10/21/23 1415 10/21/23 1430  BP: 111/75 123/83  Pulse: 90 90  Resp: (!) 24 16  Temp:    SpO2: 100% 100%    Last Pain:  Vitals:   10/21/23 1439  TempSrc:   PainSc: 4                  Gavin Faivre S

## 2023-10-22 NOTE — Telephone Encounter (Signed)
 Spoke with patient to discuss upcoming procedure.   Confirmed patient is scheduled for a PPM generator change on Wednesday, May 28 with Dr. Agatha Horsfall. Instructed patient to arrive at the Main Entrance A at [facility]: [address] [place], [place] [address] and check in at Admitting at 1:00 PM.   Labs completed  Any recent signs of acute illness or been started on antibiotics?  No. Lumbar laminectomy performed 5/20.  Any new medications started? Percocet PRN Any medications to hold? Tirzepatide for 7 days- last dose 5/12.   Medication instructions:  On the morning of your procedure you may take all your other morning medications with small sip of water.  No eating or drinking after midnight prior to procedure.   The night before your procedure and the morning of your procedure, wash thoroughly with the CHG surgical soap from the neck down, paying special attention to the area where your procedure will be performed.  Advised of plan to go home the same day and will only stay overnight if medically necessary. You MUST have a responsible adult to drive you home and MUST be with you the first 24 hours after you arrive home.  Patient verbalized understanding to all instructions provided and agreed to proceed with procedure.

## 2023-10-23 ENCOUNTER — Ambulatory Visit

## 2023-10-23 ENCOUNTER — Ambulatory Visit: Payer: Self-pay | Admitting: Cardiology

## 2023-10-28 NOTE — Pre-Procedure Instructions (Signed)
 Instructed patient on the following items: Arrival time 1200- new arrival time Nothing to eat or drink after midnight No meds AM of procedure Responsible person to drive you home and stay with you for 24 hrs Wash with special soap night before and morning of procedure

## 2023-10-29 ENCOUNTER — Encounter (HOSPITAL_COMMUNITY)
Admission: RE | Disposition: A | Discharge status: Home / Self Care | Payer: Self-pay | Source: Home / Self Care | Attending: Cardiology

## 2023-10-29 ENCOUNTER — Ambulatory Visit (HOSPITAL_COMMUNITY)
Admission: RE | Admit: 2023-10-29 | Discharge: 2023-10-29 | Disposition: A | Discharge status: Home / Self Care | Attending: Cardiology | Admitting: Cardiology

## 2023-10-29 ENCOUNTER — Other Ambulatory Visit: Payer: Self-pay

## 2023-10-29 DIAGNOSIS — Z45010 Encounter for checking and testing of cardiac pacemaker pulse generator [battery]: Secondary | ICD-10-CM | POA: Diagnosis present

## 2023-10-29 DIAGNOSIS — I10 Essential (primary) hypertension: Secondary | ICD-10-CM | POA: Diagnosis not present

## 2023-10-29 DIAGNOSIS — Z79899 Other long term (current) drug therapy: Secondary | ICD-10-CM | POA: Insufficient documentation

## 2023-10-29 DIAGNOSIS — R001 Bradycardia, unspecified: Secondary | ICD-10-CM | POA: Diagnosis not present

## 2023-10-29 DIAGNOSIS — I442 Atrioventricular block, complete: Secondary | ICD-10-CM | POA: Insufficient documentation

## 2023-10-29 HISTORY — PX: PPM GENERATOR CHANGEOUT: EP1233

## 2023-10-29 SURGERY — PPM GENERATOR CHANGEOUT

## 2023-10-29 MED ORDER — SODIUM CHLORIDE 0.9 % IV SOLN: INTRAVENOUS | Status: DC

## 2023-10-29 MED ORDER — FENTANYL CITRATE (PF) 100 MCG/2ML IJ SOLN
INTRAMUSCULAR | Status: AC
  Filled 2023-10-29: qty 2

## 2023-10-29 MED ORDER — SODIUM CHLORIDE 0.9 % IV SOLN
INTRAVENOUS | Status: AC
  Administered 2023-10-29: 80 mg
  Filled 2023-10-29: qty 2

## 2023-10-29 MED ORDER — ACETAMINOPHEN 325 MG PO TABS: 325.0000 mg | ORAL_TABLET | ORAL | Status: DC | PRN

## 2023-10-29 MED ORDER — ONDANSETRON HCL 4 MG/2ML IJ SOLN: 4.0000 mg | Freq: Four times a day (QID) | INTRAMUSCULAR | Status: DC | PRN

## 2023-10-29 MED ORDER — FENTANYL CITRATE (PF) 100 MCG/2ML IJ SOLN
INTRAMUSCULAR | Status: DC | PRN
  Administered 2023-10-29 (×3): 25 ug via INTRAVENOUS

## 2023-10-29 MED ORDER — MIDAZOLAM HCL 2 MG/2ML IJ SOLN
INTRAMUSCULAR | Status: AC
Start: 2023-10-29 — End: ?
  Filled 2023-10-29: qty 2

## 2023-10-29 MED ORDER — CHLORHEXIDINE GLUCONATE 4 % EX SOLN
4.0000 | Freq: Once | CUTANEOUS | Status: DC
  Filled 2023-10-29: qty 60

## 2023-10-29 MED ORDER — POVIDONE-IODINE 10 % EX SWAB
2.0000 | Freq: Once | CUTANEOUS | Status: AC
  Administered 2023-10-29: 2 via TOPICAL

## 2023-10-29 MED ORDER — LIDOCAINE HCL (PF) 1 % IJ SOLN
INTRAMUSCULAR | Status: AC
Start: 2023-10-29 — End: ?
  Filled 2023-10-29: qty 60

## 2023-10-29 MED ORDER — CEFAZOLIN SODIUM-DEXTROSE 2-4 GM/100ML-% IV SOLN
INTRAVENOUS | Status: AC
  Administered 2023-10-29: 2 g via INTRAVENOUS
  Filled 2023-10-29: qty 100

## 2023-10-29 MED ORDER — LIDOCAINE HCL (PF) 1 % IJ SOLN
INTRAMUSCULAR | Status: DC | PRN
  Administered 2023-10-29: 60 mL

## 2023-10-29 MED ORDER — MIDAZOLAM HCL 5 MG/5ML IJ SOLN
INTRAMUSCULAR | Status: DC | PRN
  Administered 2023-10-29 (×2): 1 mg via INTRAVENOUS

## 2023-10-29 MED ORDER — CEFAZOLIN SODIUM-DEXTROSE 2-4 GM/100ML-% IV SOLN: 2.0000 g | INTRAVENOUS | Status: AC

## 2023-10-29 MED ORDER — SODIUM CHLORIDE 0.9 % IV SOLN: 80.0000 mg | INTRAVENOUS | Status: AC

## 2023-10-29 SURGICAL SUPPLY — 6 items
CABLE SURGICAL S-101-97-12 (CABLE) ×1 IMPLANT
IPG PACE AZUR XT DR MRI W1DR01 (Pacemaker) IMPLANT
PAD DEFIB RADIO PHYSIO CONN (PAD) ×1 IMPLANT
POUCH AIGIS-R ANTIBACT PPM (Mesh General) ×1 IMPLANT
POUCH AIGIS-R ANTIBACT PPM MED (Mesh General) IMPLANT
TRAY PACEMAKER INSERTION (PACKS) ×1 IMPLANT

## 2023-10-29 NOTE — Interval H&P Note (Signed)
 History and Physical Interval Note:  10/29/2023 2:04 PM  Angela [name]  has presented today for surgery, with the diagnosis of eri.  The various methods of treatment have been discussed with the patient and family. After consideration of risks, benefits and other options for treatment, the patient has consented to  Procedure(s): PPM GENERATOR CHANGEOUT (N/A) as a surgical intervention.  The patient's history has been reviewed, patient examined, no change in status, stable for surgery.  I have reviewed the patient's chart and labs.  Questions were answered to the patient's satisfaction.     Jelisha Weed [employer]

## 2023-10-29 NOTE — Discharge Instructions (Signed)

## 2023-10-30 ENCOUNTER — Encounter: Payer: Self-pay | Admitting: Emergency Medicine

## 2023-10-30 ENCOUNTER — Encounter (HOSPITAL_COMMUNITY): Payer: Self-pay | Admitting: Cardiology

## 2023-10-30 MED FILL — Midazolam HCl Inj 2 MG/2ML (Base Equivalent): INTRAMUSCULAR | Qty: 2 | Status: AC

## 2023-10-30 NOTE — Progress Notes (Signed)
 Remote pacemaker transmission.

## 2023-10-30 NOTE — Addendum Note (Signed)
 Addended by: Edra Govern D on: 10/30/2023 03:20 PM   Modules accepted: Orders, Level of Service

## 2023-11-04 ENCOUNTER — Other Ambulatory Visit: Payer: Self-pay | Admitting: Surgery

## 2023-11-07 ENCOUNTER — Ambulatory Visit: Payer: Medicare PPO

## 2023-11-11 ENCOUNTER — Ambulatory Visit: Attending: Cardiology

## 2023-11-11 ENCOUNTER — Encounter: Payer: Self-pay | Admitting: Emergency Medicine

## 2023-11-11 DIAGNOSIS — M7138 Other bursal cyst, other site: Secondary | ICD-10-CM | POA: Insufficient documentation

## 2023-11-11 DIAGNOSIS — I442 Atrioventricular block, complete: Secondary | ICD-10-CM

## 2023-11-11 DIAGNOSIS — R208 Other disturbances of skin sensation: Secondary | ICD-10-CM | POA: Insufficient documentation

## 2023-11-11 DIAGNOSIS — M25559 Pain in unspecified hip: Secondary | ICD-10-CM | POA: Insufficient documentation

## 2023-11-11 DIAGNOSIS — M5430 Sciatica, unspecified side: Secondary | ICD-10-CM | POA: Insufficient documentation

## 2023-11-11 NOTE — Patient Instructions (Signed)

## 2023-11-11 NOTE — Progress Notes (Signed)
 Normal dual chamber pacemaker wound check. Presenting rhythm: AS/VP. Wound well healed. Routine testing performed. Thresholds, sensing, and impedances consistent. No episodes. Pt enrolled in remote follow-up

## 2023-11-18 ENCOUNTER — Ambulatory Visit: Admitting: Dermatology

## 2023-11-18 ENCOUNTER — Encounter: Payer: Self-pay | Admitting: Dermatology

## 2023-11-18 ENCOUNTER — Ambulatory Visit

## 2023-11-18 ENCOUNTER — Ambulatory Visit: Admitting: Emergency Medicine

## 2023-11-18 VITALS — Ht 64.0 in | Wt 138.0 lb

## 2023-11-18 VITALS — BP 115/81 | HR 89

## 2023-11-18 DIAGNOSIS — Z1283 Encounter for screening for malignant neoplasm of skin: Secondary | ICD-10-CM | POA: Diagnosis not present

## 2023-11-18 DIAGNOSIS — W908XXA Exposure to other nonionizing radiation, initial encounter: Secondary | ICD-10-CM | POA: Diagnosis not present

## 2023-11-18 DIAGNOSIS — L578 Other skin changes due to chronic exposure to nonionizing radiation: Secondary | ICD-10-CM

## 2023-11-18 DIAGNOSIS — D225 Melanocytic nevi of trunk: Secondary | ICD-10-CM | POA: Diagnosis not present

## 2023-11-18 DIAGNOSIS — D492 Neoplasm of unspecified behavior of bone, soft tissue, and skin: Secondary | ICD-10-CM | POA: Diagnosis not present

## 2023-11-18 DIAGNOSIS — D229 Melanocytic nevi, unspecified: Secondary | ICD-10-CM

## 2023-11-18 DIAGNOSIS — L814 Other melanin hyperpigmentation: Secondary | ICD-10-CM

## 2023-11-18 DIAGNOSIS — L720 Epidermal cyst: Secondary | ICD-10-CM

## 2023-11-18 DIAGNOSIS — Z0000 Encounter for general adult medical examination without abnormal findings: Secondary | ICD-10-CM

## 2023-11-18 DIAGNOSIS — D485 Neoplasm of uncertain behavior of skin: Secondary | ICD-10-CM

## 2023-11-18 DIAGNOSIS — D1801 Hemangioma of skin and subcutaneous tissue: Secondary | ICD-10-CM

## 2023-11-18 DIAGNOSIS — Z808 Family history of malignant neoplasm of other organs or systems: Secondary | ICD-10-CM

## 2023-11-18 DIAGNOSIS — L821 Other seborrheic keratosis: Secondary | ICD-10-CM

## 2023-11-18 NOTE — Patient Instructions (Addendum)

## 2023-11-18 NOTE — Progress Notes (Signed)
   New Patient Visit   Subjective  Angela [name] is a 53 y.o. female who presents for the following: Skin Cancer Screening and Full Body Skin Exam. No hx of skin cancer. Family hx of BCC and MM.  The patient presents for Total-Body Skin Exam (TBSE) for skin cancer screening and mole check. The patient has spots, moles and lesions to be evaluated, some may be new or changing.  The following portions of the chart were reviewed this encounter and updated as appropriate: medications, allergies, medical history  Review of Systems:  No other skin or systemic complaints except as noted in HPI or Assessment and Plan.  Objective  Well appearing patient in no apparent distress; mood and affect are within normal limits.  A full examination was performed including scalp, head, eyes, ears, nose, lips, neck, chest, axillae, abdomen, back, buttocks, bilateral upper extremities, bilateral lower extremities, hands, feet, fingers, toes, fingernails, and toenails. All findings within normal limits unless otherwise noted below.   Relevant physical exam findings are noted in the Assessment and Plan.  Left Labium Majus 6 mm pink brown papule    Assessment & Plan   SKIN CANCER SCREENING PERFORMED TODAY.  ACTINIC DAMAGE - Chronic condition, secondary to cumulative UV/sun exposure - diffuse scaly erythematous macules with underlying dyspigmentation - Recommend daily broad spectrum sunscreen SPF 30+ to sun-exposed areas, reapply every 2 hours as needed.  - Staying in the shade or wearing long sleeves, sun glasses (UVA+UVB protection) and wide brim hats (4-inch brim around the entire circumference of the hat) are also recommended for sun protection.  - Call for new or changing lesions.  LENTIGINES, SEBORRHEIC KERATOSES, HEMANGIOMAS - Benign normal skin lesions - Benign-appearing - Call for any changes  MELANOCYTIC NEVI - Tan-brown and/or pink-flesh-colored symmetric macules and papules - Benign  appearing on exam today - Observation - Call clinic for new or changing moles - Recommend daily use of broad spectrum spf 30+ sunscreen to sun-exposed areas.   NEOPLASM OF UNCERTAIN BEHAVIOR OF SKIN Left Labium Majus Skin / nail biopsy Type of biopsy: tangential   Informed consent: discussed and consent obtained   Timeout: patient name, date of birth, surgical site, and procedure verified   Procedure prep:  Patient was prepped and draped in usual sterile fashion Prep type:  Isopropyl alcohol Anesthesia: the lesion was anesthetized in a standard fashion   Anesthetic:  1% lidocaine  w/ epinephrine  1-100,000 local infiltration Instrument used: DermaBlade   Hemostasis achieved with: aluminum chloride   Outcome: patient tolerated procedure well   Post-procedure details: sterile dressing applied   Dressing type: petrolatum and bandage   Specimen 1 - Surgical pathology Differential Diagnosis: r/o SK vs NMSC  Check Margins: No  Return for TBSC.  I, Haig Levan, Surg Tech III, am acting as scribe for Deneise Finlay, MD.   Documentation: I have reviewed the above documentation for accuracy and completeness, and I agree with the above.  Deneise Finlay, MD

## 2023-11-18 NOTE — Progress Notes (Signed)
 Subjective:   Angela [name] is a 53 y.o. who presents for a Medicare Wellness preventive visit.  As a reminder, Annual Wellness Visits don't include a physical exam, and some assessments may be limited, especially if this visit is performed virtually. We may recommend an in-person follow-up visit with your provider if needed.  Visit Complete: Virtual I connected with  [name] on 11/18/23 by a audio enabled telemedicine application and verified that I am speaking with the correct person using two identifiers.  Patient Location: Home  Provider Location: Home Office  I discussed the limitations of evaluation and management by telemedicine. The patient expressed understanding and agreed to proceed.  Vital Signs: Because this visit was a virtual/telehealth visit, some criteria may be missing or patient reported. Any vitals not documented were not able to be obtained and vitals that have been documented are patient reported.  VideoDeclined- This patient declined Librarian, academic. Therefore the visit was completed with audio only.  Persons Participating in Visit: Patient.  AWV Questionnaire: No: Patient Medicare AWV questionnaire was not completed prior to this visit.  Cardiac Risk Factors include: hypertension     Objective:    Today's Vitals   11/18/23 1308  Weight: 138 lb (62.6 kg)  Height: 5' 4 (1.626 m)  PainSc: 4    Body mass index is 23.69 kg/m.     11/18/2023    1:27 PM 10/29/2023   12:48 PM 10/13/2023    9:23 AM 10/29/2022    9:32 AM 01/29/2022   12:40 PM 09/20/2021    6:44 AM 09/14/2021   11:00 AM  Advanced Directives  Does Patient Have a Medical Advance Directive? Yes Yes Yes Yes Yes Yes No  Type of Estate agent of [employer] Power of [place];Living will  Healthcare Power of [place];Living will Healthcare Power of [place];Living will Healthcare Power of [place];Living will   Does patient  want to make changes to medical advance directive? No - Patient declined No - Guardian declined  No - Patient declined Yes (ED - Information included in AVS) No - Patient declined   Copy of Healthcare Power of Attorney in Chart? Yes - validated most recent copy scanned in chart (See row information) No - copy requested    Yes - validated most recent copy scanned in chart (See row information)     Current Medications (verified) Outpatient Encounter Medications as of 11/18/2023  Medication Sig   cyclobenzaprine  (FLEXERIL ) 5 MG tablet Take 1 tablet (5 mg total) by mouth 3 (three) times daily as needed for muscle spasms.   diltiazem  (CARDIZEM  CD) 120 MG 24 hr capsule Take 1 capsule (120 mg total) by mouth daily.   docusate sodium  (COLACE) 100 MG capsule Take 1 capsule (100 mg total) by mouth 2 (two) times daily as needed for mild constipation.   FIBER ADULT GUMMIES PO Take 2 each by mouth daily. Vita Fusion Fiberwell   gabapentin (NEURONTIN) 300 MG capsule Take 300 mg by mouth 2 (two) times daily. (Patient taking differently: Take 300 mg by mouth 2 (two) times daily. 200 mg twice a day)   hydroxychloroquine  (PLAQUENIL ) 200 MG tablet Take 400 mg by mouth at bedtime.   LORazepam  (ATIVAN ) 0.5 MG tablet TAKE 1 TO 2 TABLETS(0.5 TO 1 MG) BY MOUTH DAILY AS NEEDED FOR ANXIETY   losartan -hydrochlorothiazide (HYZAAR) 50-12.5 MG tablet TAKE 1 TABLET BY MOUTH DAILY   Multiple Vitamins-Minerals (MULTIVITAMIN WITH MINERALS) tablet Take 1 tablet by mouth daily.  tirzepatide 7.5 MG/0.5ML injection vial Inject 7.5 mg into the skin once a week. (Patient taking differently: Inject 7.5 mg into the skin once a week. Every 2 weeks)   traZODone  (DESYREL ) 50 MG tablet Take 0.5-1 tablets (25-50 mg total) by mouth at bedtime as needed for sleep. (Patient taking differently: Take 25-50 mg by mouth at bedtime.)   venlafaxine  XR (EFFEXOR -XR) 150 MG 24 hr capsule TAKE 1 CAPSULE(150 MG) BY MOUTH DAILY WITH BREAKFAST   docusate  sodium (COLACE) 100 MG capsule Take 100 mg by mouth daily. (Patient not taking: Reported on 11/18/2023)   oxyCODONE -acetaminophen  (PERCOCET) 5-325 MG tablet Take 1 tablet by mouth every 4 (four) hours as needed for severe pain (pain score 7-10). (Patient not taking: Reported on 11/18/2023)   No facility-administered encounter medications on file as of 11/18/2023.    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 EF 50-55% normal coronaries   Complete heart block (HCC)    Depression    Dysautonomia (HCC)    Dyspnea    CT negative for PE, 2007   Hypertension    Insomnia    OCD (obsessive compulsive disorder)    Presence of permanent cardiac pacemaker    SVT (supraventricular tachycardia) (HCC)    s/p ablation a. c/b AV nod ablation requiring pacemaker   Past Surgical History:  Procedure Laterality Date   ANTERIOR CERVICAL DECOMP/DISCECTOMY FUSION N/A 09/20/2021   Procedure: Anterior Cervical Discectomy Fusion  Cervical Four-Five, Cervical Five-Six, Cervical Six-Seven;  Surgeon: Dawley, Colby Daub, DO;  Location: [facility] OR;  Service: Neurosurgery;  Laterality: N/A;  3C   BREAST BIOPSY Right 1998   benign   BREAST BIOPSY Right 2004   benign   BREAST LUMPECTOMY     RIGHT -BREAST    COLONOSCOPY WITH PROPOFOL  N/A 09/07/2019   Procedure: COLONOSCOPY WITH PROPOFOL ;  Surgeon: Marnee Sink, MD;  Location: [facility] ENDOSCOPY;  Service: Endoscopy;  Laterality: N/A;   DECOMPRESSIVE LUMBAR LAMINECTOMY LEVEL 1 Bilateral 10/21/2023   Procedure: DECOMPRESSIVE LUMBAR LAMINECTOMY LUMBAR FOUR-FIVE;  Surgeon: Van Gelinas, MD;  Location: [facility] OR;  Service: Neurosurgery;  Laterality: Bilateral;  LAMINECTOMY OF FACET/SYNOVIAL CYST RESECTION, BILATERAL FORAMINOTOMIES, L4-L5   ENDOMETRIAL ABLATION  2005   INSERT / REPLACE / REMOVE  PACEMAKER  2007   PELVIC LAPAROSCOPY  1990   PERMANENT PACEMAKER GENERATOR CHANGE N/A 07/13/2014   MDT MRI compatible dual chamber pacemaker implanted by Dr Rodolfo Clan   POSTERIOR CERVICAL FUSION/FORAMINOTOMY N/A 11/05/2022   Procedure: POSTERIOR CERVICAL INSTRUMENTATION AND FUSION CERVICAL SIX-THORACIC ONE;  Surgeon: Pincus Bridgeman, DO;  Location: [facility] OR;  Service: Neurosurgery;  Laterality: N/A;  3C   PPM GENERATOR CHANGEOUT N/A 10/29/2023   Procedure: PPM GENERATOR CHANGEOUT;  Surgeon: Lei Pump, MD;  Location: [facility] INVASIVE CV LAB;  Service: Cardiovascular;  Laterality: N/A;   SPARK  2005   TUBAL LIGATION  2003   Family History  Problem Relation Age of Onset   Anxiety disorder Mother    Arthritis Mother    Obesity Mother    Cervical cancer Mother    Hypertension Father    Cancer Father 72       COLON   Diabetes Father    Anxiety disorder Sister    Breast cancer Maternal Aunt  mastectomy   Cancer Maternal Aunt    Breast cancer Paternal Aunt    Cancer Paternal Aunt    Breast cancer Paternal Aunt    Dementia Maternal Grandmother    Hypertension Maternal Grandfather    Diabetes Paternal Grandmother    Breast cancer Paternal Grandmother    Arthritis Paternal Grandmother    Cancer Paternal Grandmother    Diabetes Paternal Grandfather    Cancer Paternal Grandfather    Social History   Socioeconomic History   Marital status: Married    Spouse name: Darin   Number of children: 2   Years of education: Not on file   Highest education level: Bachelor's degree (e.g., BA, AB, BS)  Occupational History   Occupation: disability   Tobacco Use   Smoking status: Never   Smokeless tobacco: Never  Vaping Use   Vaping status: Never Used  Substance and Sexual Activity   Alcohol use: No   Drug use: No   Sexual activity: Yes    Birth control/protection: Surgical    Comment: Uterine ablarion 2002  Other Topics Concern   Not on file  Social History Narrative   Work with  daughters school    Social Drivers of Health   Financial Resource Strain: Low Risk  (11/18/2023)   Overall Financial Resource Strain (CARDIA)    Difficulty of Paying Living Expenses: Not very hard  Food Insecurity: No Food Insecurity (11/18/2023)   Hunger Vital Sign    Worried About Running Out of Food in the Last Year: Never true    Ran Out of Food in the Last Year: Never true  Transportation Needs: No Transportation Needs (11/18/2023)   PRAPARE - Administrator, Civil Service (Medical): No    Lack of Transportation (Non-Medical): No  Physical Activity: Inactive (11/18/2023)   Exercise Vital Sign    Days of Exercise per Week: 0 days    Minutes of Exercise per Session: 0 min  Stress: Stress Concern Present (11/18/2023)   [employer] of Occupational Health - Occupational Stress Questionnaire    Feeling of Stress: Rather much  Social Connections: Socially Integrated (11/18/2023)   Social Connection and Isolation Panel    Frequency of Communication with Friends and Family: Twice a week    Frequency of Social Gatherings with Friends and Family: Three times a week    Attends Religious Services: More than 4 times per year    Active Member of Clubs or Organizations: Yes    Attends Engineer, structural: More than 4 times per year    Marital Status: Married    Tobacco Counseling Counseling given: Not Answered    Clinical Intake:  Pre-visit preparation completed: Yes  Pain Score: 4  Pain Type: Chronic pain Pain Location: Neck Pain Descriptors / Indicators: Aching     BMI - recorded: 23.69 Nutritional Status: BMI of 19-24  Normal Nutritional Risks: Nausea/ vomitting/ diarrhea (nausea 11/17/23) Diabetes: No  No results found for: HGBA1C   How often do you need to have someone help you when you read instructions, pamphlets, or other written materials from your doctor or pharmacy?: 1 - Never  Interpreter Needed?: No  Information entered by :: Jaunita Messier, CMA   Activities of Daily Living     11/18/2023    1:12 PM 10/13/2023    9:25 AM  In your present state of health, do you have any difficulty performing the following activities:  Hearing? 0   Vision? 0   Difficulty concentrating  or making decisions? 1   Comment OCD and POTS   Walking or climbing stairs? 1   Comment climbing stairs, takes it slow   Dressing or bathing? 0   Doing errands, shopping? 0 0  Preparing Food and eating ? N   Using the Toilet? N   In the past six months, have you accidently leaked urine? Y   Comment due to cyst on spine, has been removed and no problems since then.   Do you have problems with loss of bowel control? N   Managing your Medications? N   Managing your Finances? N   Housekeeping or managing your Housekeeping? N     Patient Care Team: Solomon Dupre, DO as PCP - General (Family Medicine) Constancia Delton, MD as PCP - Cardiology (Cardiology) Lei Pump, MD as PCP - Electrophysiology (Cardiology) Orlene Bjork, OD as Referring Physician (Optometry) Paci, Lenore Rafter, MD (Dermatology) Van Gelinas, MD as Consulting Physician (Neurosurgery) Aryal, Govinda, MD (Rheumatology)  I have updated your Care Teams any recent Medical Services you may have received from other providers in the past year.     Assessment:   This is a routine wellness examination for Trellis.  Hearing/Vision screen Hearing Screening - Comments:: Denies hearing loss Vision Screening - Comments:: Gets routine eye exams, Dr. Orlene Bjork, [place]   Goals Addressed             This Visit's Progress    Patient Stated       Get up an hour earlier and work on muscles       Depression Screen     11/18/2023    1:22 PM 08/18/2023    9:12 AM 01/17/2023    3:13 PM 08/08/2022    9:31 AM 01/08/2022    9:04 AM 07/31/2021    9:27 AM 07/11/2021    9:34 AM  PHQ 2/9 Scores  PHQ - 2 Score 1 2 3 2 2 2 2   PHQ- 9 Score 8 11 13 13 6 5 9     Fall  Risk     11/18/2023    1:29 PM 01/17/2023    3:12 PM 08/08/2022    9:31 AM 07/31/2021    9:20 AM 07/11/2021    9:34 AM  Fall Risk   Falls in the past year? 1 1 1  0 0  Number falls in past yr: 1 1 1  0 0  Injury with Fall? 1 1 1  0 0  Risk for fall due to : History of fall(s);Impaired balance/gait;Orthopedic patient  History of fall(s)  No Fall Risks  Follow up Falls evaluation completed;Education provided  Falls evaluation completed Falls evaluation completed;Falls prevention discussed  Falls evaluation completed      Data saved with a previous flowsheet row definition    MEDICARE RISK AT HOME:  Medicare Risk at Home Any stairs in or around the home?: Yes If so, are there any without handrails?: No Home free of loose throw rugs in walkways, pet beds, electrical cords, etc?: Yes Adequate lighting in your home to reduce risk of falls?: Yes Life alert?: No Use of a cane, walker or w/c?: No Grab bars in the bathroom?: No Shower chair or bench in shower?: No Elevated toilet seat or a handicapped toilet?: Yes  TIMED UP AND GO:  Was the test performed?  No  Cognitive Function: 6CIT completed        11/18/2023    1:31 PM 08/08/2022    9:56 AM 07/21/2020  2:51 PM 07/09/2019    8:44 AM 01/23/2018   10:49 AM  6CIT Screen  What Year? 0 points 0 points 0 points 0 points 0 points  What month? 0 points 0 points 0 points 0 points 0 points  What time? 0 points 0 points 0 points 0 points 0 points  Count back from 20 0 points 0 points 0 points 0 points 0 points  Months in reverse 0 points 0 points 0 points 0 points 0 points  Repeat phrase 4 points 0 points 4 points 2 points 0 points  Total Score 4 points 0 points 4 points 2 points 0 points    Immunizations Immunization History  Administered Date(s) Administered   Influenza,inj,Quad PF,6+ Mos 02/27/2017, 03/18/2018, 03/19/2019, 02/14/2020, 03/08/2021, 08/08/2022   PFIZER(Purple Top)SARS-COV-2 Vaccination 08/30/2019, 09/22/2019, 05/04/2020    Td 07/11/2021   Tdap 08/02/2011    Screening Tests Health Maintenance  Topic Date Due   Zoster Vaccines- Shingrix (1 of 2) Never done   Pneumococcal Vaccine 35-36 Years old (1 of 2 - PCV) 02/18/2024 (Originally 03/20/1990)   INFLUENZA VACCINE  01/02/2024   Colonoscopy  09/06/2024   MAMMOGRAM  09/25/2024   Medicare Annual Wellness (AWV)  11/17/2024   Cervical Cancer Screening (HPV/Pap Cotest)  07/10/2025   DTaP/Tdap/Td (3 - Td or Tdap) 07/12/2031   Hepatitis C Screening  Completed   HIV Screening  Completed   HPV VACCINES  Aged Out   Meningococcal B Vaccine  Aged Out   COVID-19 Vaccine  Discontinued    Health Maintenance  Health Maintenance Due  Topic Date Due   Zoster Vaccines- Shingrix (1 of 2) Never done   Health Maintenance Items Addressed: See Nurse Notes at the end of this note  Additional Screening:  Vision Screening: Recommended annual ophthalmology exams for early detection of glaucoma and other disorders of the eye. Would you like a referral to an eye doctor? No    Dental Screening: Recommended annual dental exams for proper oral hygiene  Community Resource Referral / Chronic Care Management: CRR required this visit?  No   CCM required this visit?  No   Plan:    I have personally reviewed and noted the following in the patient's chart:   Medical and social history Use of alcohol, tobacco or illicit drugs  Current medications and supplements including opioid prescriptions. Patient is not currently taking opioid prescriptions. Functional ability and status Nutritional status Physical activity Advanced directives List of other physicians Hospitalizations, surgeries, and ER visits in previous 12 months Vitals Screenings to include cognitive, depression, and falls Referrals and appointments  In addition, I have reviewed and discussed with patient certain preventive protocols, quality metrics, and best practice recommendations. A written personalized  care plan for preventive services as well as general preventive health recommendations were provided to patient.   Jaunita Messier, CMA   11/18/2023   After Visit Summary: (MyChart) Due to this being a telephonic visit, the after visit summary with patients personalized plan was offered to patient via MyChart   Notes:  6 CIT Score - 4 Shingrix UTD per patient (received at [facility]) Declined Covid vaccine Declined pneumonia vaccine at this time

## 2023-11-18 NOTE — Patient Instructions (Signed)
 Angela [name] , Thank you for taking time out of your busy schedule to complete your Annual Wellness Visit with me. I enjoyed our conversation and look forward to speaking with you again next year. I, as well as your care team,  appreciate your ongoing commitment to your health goals. Please review the following plan we discussed and let me know if I can assist you in the future. Your Game plan/ To Do List    Referrals: None  Follow up Visits: Next Medicare AWV with our clinical staff: 11/23/24 @ 1:10pm (IN PERSON VISIT)    Have you seen your provider in the last 6 months (3 months if uncontrolled diabetes)? Yes Next Office Visit with your provider: Please call the office and schedule an appointment with Dr. Lincoln Renshaw. She had wanted to see you 2 months after your last visit on 08/18/23 after restarting Trazadone.  Clinician Recommendations:  Aim for 30 minutes of exercise or brisk walking, 6-8 glasses of water, and 5 servings of fruits and vegetables each day.       This is a list of the screening recommended for you and due dates:  Health Maintenance  Topic Date Due   Zoster (Shingles) Vaccine (1 of 2) Never done   Pneumococcal Vaccination (1 of 2 - PCV) 02/18/2024*   Flu Shot  01/02/2024   Colon Cancer Screening  09/06/2024   Mammogram  09/25/2024   Medicare Annual Wellness Visit  11/17/2024   Pap with HPV screening  07/10/2025   DTaP/Tdap/Td vaccine (3 - Td or Tdap) 07/12/2031   Hepatitis C Screening  Completed   HIV Screening  Completed   HPV Vaccine  Aged Out   Meningitis B Vaccine  Aged Out   COVID-19 Vaccine  Discontinued  *Topic was postponed. The date shown is not the original due date.    Advanced directives: (In Chart) A copy of your advanced directives are scanned into your chart should your provider ever need it. Advance Care Planning is important because it:  [x]  Makes sure you receive the medical care that is consistent with your values, goals, and preferences  [x]  It  provides guidance to your family and loved ones and reduces their decisional burden about whether or not they are making the right decisions based on your wishes.  Follow the link provided in your after visit summary or read over the paperwork we have mailed to you to help you started getting your Advance Directives in place. If you need assistance in completing these, please reach out to us  so that we can help you!  See attachments for Preventive Care and Fall Prevention Tips.  Fall Prevention in the Home, Adult Falls can cause injuries and affect people of all ages. There are many simple things that you can do to make your home safe and to help prevent falls. If you need it, ask for help making these changes. What actions can I take to prevent falls? General information Use good lighting in all rooms. Make sure to: Replace any light bulbs that burn out. Turn on lights if it is dark and use night-lights. Keep items that you use often in easy-to-reach places. Lower the shelves around your home if needed. Move furniture so that there are clear paths around it. Do not keep throw rugs or other things on the floor that can make you trip. If any of your floors are uneven, fix them. Add color or contrast paint or tape to clearly mark and help you see:  Grab bars or handrails. First and last steps of staircases. Where the edge of each step is. If you use a ladder or stepladder: Make sure that it is fully opened. Do not climb a closed ladder. Make sure the sides of the ladder are locked in place. Have someone hold the ladder while you use it. Know where your pets are as you move through your home. What can I do in the bathroom?     Keep the floor dry. Clean up any water that is on the floor right away. Remove soap buildup in the bathtub or shower. Buildup makes bathtubs and showers slippery. Use non-skid mats or decals on the floor of the bathtub or shower. Attach bath mats securely with  double-sided, non-slip rug tape. If you need to sit down while you are in the shower, use a non-slip stool. Install grab bars by the toilet and in the bathtub and shower. Do not use towel bars as grab bars. What can I do in the bedroom? Make sure that you have a light by your bed that is easy to reach. Do not use any sheets or blankets on your bed that hang to the floor. Have a firm bench or chair with side arms that you can use for support when you get dressed. What can I do in the kitchen? Clean up any spills right away. If you need to reach something above you, use a sturdy step stool that has a grab bar. Keep electrical cables out of the way. Do not use floor polish or wax that makes floors slippery. What can I do with my stairs? Do not leave anything on the stairs. Make sure that you have a light switch at the top and the bottom of the stairs. Have them installed if you do not have them. Make sure that there are handrails on both sides of the stairs. Fix handrails that are broken or loose. Make sure that handrails are as long as the staircases. Install non-slip stair treads on all stairs in your home if they do not have carpet. Avoid having throw rugs at the top or bottom of stairs, or secure the rugs with carpet tape to prevent them from moving. Choose a carpet design that does not hide the edge of steps on the stairs. Make sure that carpet is firmly attached to the stairs. Fix any carpet that is loose or worn. What can I do on the outside of my home? Use bright outdoor lighting. Repair the edges of walkways and driveways and fix any cracks. Clear paths of anything that can make you trip, such as tools or rocks. Add color or contrast paint or tape to clearly mark and help you see high doorway thresholds. Trim any bushes or trees on the main path into your home. Check that handrails are securely fastened and in good repair. Both sides of all steps should have handrails. Install  guardrails along the edges of any raised decks or porches. Have leaves, snow, and ice cleared regularly. Use sand, salt, or ice melt on walkways during winter months if you live where there is ice and snow. In the garage, clean up any spills right away, including grease or oil spills. What other actions can I take? Review your medicines with your health care provider. Some medicines can make you confused or feel dizzy. This can increase your chance of falling. Wear closed-toe shoes that fit well and support your feet. Wear shoes that have rubber soles and low  heels. Use a cane, walker, scooter, or crutches that help you move around if needed. Talk with your provider about other ways that you can decrease your risk of falls. This may include seeing a physical therapist to learn to do exercises to improve movement and strength. Where to find more information Centers for Disease Control and Prevention, STEADI: TonerPromos.no [employer] on Aging: BaseRingTones.pl National Institute on Aging: BaseRingTones.pl Contact a health care provider if: You are afraid of falling at home. You feel weak, drowsy, or dizzy at home. You fall at home. Get help right away if you: Lose consciousness or have trouble moving after a fall. Have a fall that causes a head injury. These symptoms may be an emergency. Get help right away. Call 911. Do not wait to see if the symptoms will go away. Do not drive yourself to the hospital. This information is not intended to replace advice given to you by your health care provider. Make sure you discuss any questions you have with your health care provider. Document Revised: 01/21/2022 Document Reviewed: 01/21/2022 Elsevier Patient Education  2024 [employer].

## 2023-11-20 ENCOUNTER — Ambulatory Visit: Payer: Self-pay | Admitting: Dermatology

## 2023-11-20 LAB — SURGICAL PATHOLOGY

## 2023-11-24 NOTE — Progress Notes (Signed)
 Remote pacemaker transmission.

## 2023-11-24 NOTE — Addendum Note (Signed)
 Addended by: VICCI SELLER A on: 11/24/2023 02:59 PM   Modules accepted: Orders, Level of Service

## 2023-12-02 ENCOUNTER — Encounter: Payer: Self-pay | Admitting: Family Medicine

## 2023-12-02 NOTE — Telephone Encounter (Signed)
 appt

## 2023-12-02 NOTE — Telephone Encounter (Signed)
 Scheduled for 7/29 9am

## 2023-12-30 ENCOUNTER — Ambulatory Visit: Admitting: Family Medicine

## 2023-12-30 ENCOUNTER — Encounter: Payer: Self-pay | Admitting: Family Medicine

## 2023-12-30 VITALS — BP 107/76 | HR 89 | Temp 98.0°F | Ht 64.0 in | Wt 132.6 lb

## 2023-12-30 DIAGNOSIS — M25552 Pain in left hip: Secondary | ICD-10-CM | POA: Diagnosis not present

## 2023-12-30 DIAGNOSIS — M25551 Pain in right hip: Secondary | ICD-10-CM

## 2023-12-30 DIAGNOSIS — N951 Menopausal and female climacteric states: Secondary | ICD-10-CM

## 2023-12-30 MED ORDER — COMBIPATCH 0.05-0.14 MG/DAY TD PTTW
1.0000 | MEDICATED_PATCH | TRANSDERMAL | 12 refills | Status: AC
Start: 2024-01-01 — End: ?

## 2023-12-30 NOTE — Progress Notes (Signed)
 BP 107/76   Pulse 89   Temp 98 F (36.7 C) (Oral)   Ht 5' 4 (1.626 m)   Wt 132 lb 9.6 oz (60.1 kg)   LMP 09/10/2021 (Within Days)   SpO2 97%   BMI 22.76 kg/m    Subjective:    Patient ID: Angela [name], female    DOB: 1971/01/03, 53 y.o.   MRN: [id number]  HPI: Angela [name] is a 53 y.o. female  Chief Complaint  Patient presents with   Hip Pain    Onset started in the right hip about a year ago. Started in the left hip about 6 months ago and worsening. Patient that the hip pain worsens when trying to sleep. Keeps Pt from sleeping. Pain is constant. Worsening with a lot of movement as well as sitting for long periods of time. Heating pads helps some. Is taking Gabapentin PRN which does help.     HIP PAIN Duration: chronic Involved hip: bilateral  Mechanism of injury: unknown Location: diffuse Onset: gradual  Severity: moderate  Quality: aching and sore Frequency: constant Radiation: no Aggravating factors: walking, laying on them   Alleviating factors: nothing  Status: stable Treatments attempted: rest, ice, heat, APAP, ibuprofen, aleve, physical therapy, HEP, and chiropractor   Relief with NSAIDs?: No NSAIDs Taken Weakness with weight bearing: no Weakness with walking: no Paresthesias / decreased sensation: no Swelling: no Redness:no Fevers: no  MENOPAUSAL SYMPTOMS Duration: uncontrolled Symptom severity: mild Hot flashes: no Night sweats: no Sleep disturbances: yes Vaginal dryness: yes Dyspareunia:no Decreased libido: yes Emotional lability: yes Stress incontinence: no Previous HRT/pharmacotherapy: no Hysterectomy: no Absolute Contraindications to Hormonal Therapy:     Undiagnosed vaginal bleeding: no    Breast cancer: no    Endometrial cancer: no    Coronary disease: no    Cerebrovascular disease: no    Venous thromboembolic disease: no   Relevant past medical, surgical, family and social history reviewed and updated as indicated.  Interim medical history since our last visit reviewed. Allergies and medications reviewed and updated.  Review of Systems  Constitutional: Negative.   Respiratory: Negative.    Cardiovascular: Negative.   Musculoskeletal:  Positive for arthralgias and myalgias. Negative for back pain, gait problem, joint swelling, neck pain and neck stiffness.  Skin: Negative.   Neurological: Negative.   Psychiatric/Behavioral: Negative.      Per HPI unless specifically indicated above     Objective:    BP 107/76   Pulse 89   Temp 98 F (36.7 C) (Oral)   Ht 5' 4 (1.626 m)   Wt 132 lb 9.6 oz (60.1 kg)   LMP 09/10/2021 (Within Days)   SpO2 97%   BMI 22.76 kg/m   Wt Readings from Last 3 Encounters:  12/30/23 132 lb 9.6 oz (60.1 kg)  11/18/23 138 lb (62.6 kg)  10/29/23 137 lb (62.1 kg)    Physical Exam Vitals and nursing note reviewed.  Constitutional:      General: She is not in acute distress.    Appearance: Normal appearance. She is not ill-appearing, toxic-appearing or diaphoretic.  HENT:     Head: Normocephalic and atraumatic.     Right Ear: External ear normal.     Left Ear: External ear normal.     Nose: Nose normal.     Mouth/Throat:     Mouth: Mucous membranes are moist.     Pharynx: Oropharynx is clear.  Eyes:     General: No scleral icterus.  Right eye: No discharge.        Left eye: No discharge.     Extraocular Movements: Extraocular movements intact.     Conjunctiva/sclera: Conjunctivae normal.     Pupils: Pupils are equal, round, and reactive to light.  Cardiovascular:     Rate and Rhythm: Normal rate and regular rhythm.     Pulses: Normal pulses.     Heart sounds: Normal heart sounds. No murmur heard.    No friction rub. No gallop.  Pulmonary:     Effort: Pulmonary effort is normal. No respiratory distress.     Breath sounds: Normal breath sounds. No stridor. No wheezing, rhonchi or rales.  Chest:     Chest wall: No tenderness.  Musculoskeletal:         General: Normal range of motion.     Cervical back: Normal range of motion and neck supple.  Skin:    General: Skin is warm and dry.     Capillary Refill: Capillary refill takes less than 2 seconds.     Coloration: Skin is not jaundiced or pale.     Findings: No bruising, erythema, lesion or rash.  Neurological:     General: No focal deficit present.     Mental Status: She is alert and oriented to person, place, and time. Mental status is at baseline.  Psychiatric:        Mood and Affect: Mood normal.        Behavior: Behavior normal.        Thought Content: Thought content normal.        Judgment: Judgment normal.     Results for orders placed or performed in visit on 11/18/23  Surgical pathology   Collection Time: 11/18/23 12:00 AM  Result Value Ref Range   SURGICAL PATHOLOGY      SURGICAL PATHOLOGY [facility] [address] [place], [place] [address] Telephone [phone] or [phone] Fax [phone]  REPORT OF DERMATOPATHOLOGY   Accession #: [id number] Patient Name: Angela [name] Visit # : [id number]  MRN: [id number] Cytotechnologist: Ephriam Rolla Edelman, Dermatopathologist, Electronic Signature DOB/Age 08-07-1970 (Age: 4) Gender: F Collected Date: 11/18/2023 Received Date: 11/18/2023  FINAL DIAGNOSIS       1. Skin, left labium majus :       MELANOCYTIC NEVUS WITH HYPERPIGMENTATION, IRRITATED AND EPIDERMOID CYST       DATE SIGNED OUT: 11/20/2023 ELECTRONIC SIGNATURE : Depcik-Smith Md, Natalie, Dermatopathologist, Electronic Signature  MICROSCOPIC DESCRIPTION 1. There are nests of banal appearing melanocytes primarily within the dermis with no appreciable atypia.  The cytoplasm is characterized by at least focal hyperpigmentation.  There are changes suggesting that this lesion has b een irritated.  Also, there is a cyst lined by squamous epithelium with granular layer, containing loose lamellar  keratin.  CASE COMMENTS STAINS USED IN DIAGNOSIS: H&E    CLINICAL HISTORY  SPECIMEN(S) OBTAINED 1. Skin, Left Labium Majus  SPECIMEN COMMENTS: 1. 6 mm pink brown papule SPECIMEN CLINICAL INFORMATION: 1. Neoplasm of uncertain behavior of skin, R/O SK vs NMSC    Gross Description 1. Formalin fixed specimen received:  6 X 5 X 2 MM, TOTO (2 P) (1 B) ( erh )        Report signed out from the following location(s) [place] [address] [place], [place] [address] CLIA #: [id number]  [facility] [address] [place], [place] [address] CLIA #: [id number]  Assessment & Plan:   Problem List Items Addressed This Visit   None Visit Diagnoses       Perimenopause    -  Primary   Will check labs and start combipatch . Recheck 6 weeks. Call with any concerns.   Relevant Orders   FSH/LH   CBC with Differential/Platelet   Comprehensive metabolic panel with GFR   TSH   VITAMIN D  25 Hydroxy (Vit-D Deficiency, Fractures)   Estradiol    Prolactin   DHEA-sulfate     Bilateral hip pain       Will check hormones. Continue to follow with ortho and pain management. Call with any concerns.        Follow up plan: Return in about 6 weeks (around 02/10/2024).

## 2023-12-31 ENCOUNTER — Ambulatory Visit: Payer: Self-pay | Admitting: Family Medicine

## 2023-12-31 LAB — COMPREHENSIVE METABOLIC PANEL WITH GFR
ALT: 20 IU/L (ref 0–32)
AST: 24 IU/L (ref 0–40)
Albumin: 4.3 g/dL (ref 3.8–4.9)
Alkaline Phosphatase: 66 IU/L (ref 44–121)
BUN/Creatinine Ratio: 19 (ref 9–23)
BUN: 15 mg/dL (ref 6–24)
Bilirubin Total: 0.3 mg/dL (ref 0.0–1.2)
CO2: 23 mmol/L (ref 20–29)
Calcium: 9.8 mg/dL (ref 8.7–10.2)
Chloride: 103 mmol/L (ref 96–106)
Creatinine, Ser: 0.81 mg/dL (ref 0.57–1.00)
Globulin, Total: 1.7 g/dL (ref 1.5–4.5)
Glucose: 70 mg/dL (ref 70–99)
Potassium: 4.3 mmol/L (ref 3.5–5.2)
Sodium: 140 mmol/L (ref 134–144)
Total Protein: 6 g/dL (ref 6.0–8.5)
eGFR: 87 mL/min/1.73 (ref >59)

## 2023-12-31 LAB — CBC WITH DIFFERENTIAL/PLATELET
Basophils Absolute: 0 x10E3/uL (ref 0.0–0.2)
Basos: 1 %
EOS (ABSOLUTE): 0 x10E3/uL (ref 0.0–0.4)
Eos: 1 %
Hematocrit: 46.8 % — ABNORMAL HIGH (ref 34.0–46.6)
Hemoglobin: 14.9 g/dL (ref 11.1–15.9)
Immature Grans (Abs): 0 x10E3/uL (ref 0.0–0.1)
Immature Granulocytes: 0 %
Lymphocytes Absolute: 1.7 x10E3/uL (ref 0.7–3.1)
Lymphs: 38 %
MCH: 31.4 pg (ref 26.6–33.0)
MCHC: 31.8 g/dL (ref 31.5–35.7)
MCV: 99 fL — ABNORMAL HIGH (ref 79–97)
Monocytes Absolute: 0.3 x10E3/uL (ref 0.1–0.9)
Monocytes: 8 %
Neutrophils Absolute: 2.4 x10E3/uL (ref 1.4–7.0)
Neutrophils: 52 %
Platelets: 258 x10E3/uL (ref 150–450)
RBC: 4.75 x10E6/uL (ref 3.77–5.28)
RDW: 12 % (ref 11.7–15.4)
WBC: 4.5 x10E3/uL (ref 3.4–10.8)

## 2023-12-31 LAB — ESTRADIOL: Estradiol: 41.9 pg/mL

## 2023-12-31 LAB — FSH/LH
FSH: 53.3 m[IU]/mL
LH: 32.2 m[IU]/mL

## 2023-12-31 LAB — TSH: TSH: 1.1 u[IU]/mL (ref 0.450–4.500)

## 2023-12-31 LAB — PROLACTIN: Prolactin: 15 ng/mL (ref 3.6–25.2)

## 2023-12-31 LAB — DHEA-SULFATE: DHEA-SO4: 13.3 ug/dL — ABNORMAL LOW (ref 41.2–243.7)

## 2023-12-31 LAB — VITAMIN D 25 HYDROXY (VIT D DEFICIENCY, FRACTURES): Vit D, 25-Hydroxy: 149 ng/mL — ABNORMAL HIGH (ref 30.0–100.0)

## 2024-01-28 ENCOUNTER — Ambulatory Visit (INDEPENDENT_AMBULATORY_CARE_PROVIDER_SITE_OTHER)

## 2024-01-28 DIAGNOSIS — I442 Atrioventricular block, complete: Secondary | ICD-10-CM

## 2024-01-29 ENCOUNTER — Ambulatory Visit: Payer: Self-pay | Admitting: Cardiology

## 2024-01-29 LAB — CUP PACEART REMOTE DEVICE CHECK
Battery Remaining Longevity: 147 mo
Battery Voltage: 3.2 V
Brady Statistic AP VP Percent: 3.13 %
Brady Statistic AP VS Percent: 0 %
Brady Statistic AS VP Percent: 96.85 %
Brady Statistic AS VS Percent: 0.01 %
Brady Statistic RA Percent Paced: 3.13 %
Brady Statistic RV Percent Paced: 99.98 %
Date Time Interrogation Session: 20250826210203
Implantable Lead Connection Status: 753985
Implantable Lead Connection Status: 753985
Implantable Lead Implant Date: 20070319
Implantable Lead Implant Date: 20070319
Implantable Lead Location: 753859
Implantable Lead Location: 753860
Implantable Lead Model: 5076
Implantable Lead Model: 5076
Implantable Pulse Generator Implant Date: 20250528
Lead Channel Impedance Value: 285 Ohm
Lead Channel Impedance Value: 399 Ohm
Lead Channel Impedance Value: 456 Ohm
Lead Channel Impedance Value: 513 Ohm
Lead Channel Pacing Threshold Amplitude: 1 V
Lead Channel Pacing Threshold Amplitude: 1.125 V
Lead Channel Pacing Threshold Pulse Width: 0.4 ms
Lead Channel Pacing Threshold Pulse Width: 0.4 ms
Lead Channel Sensing Intrinsic Amplitude: 3 mV
Lead Channel Sensing Intrinsic Amplitude: 3 mV
Lead Channel Sensing Intrinsic Amplitude: 4.125 mV
Lead Channel Sensing Intrinsic Amplitude: 5.125 mV
Lead Channel Setting Pacing Amplitude: 1.75 V
Lead Channel Setting Pacing Amplitude: 2 V
Lead Channel Setting Pacing Pulse Width: 0.4 ms
Lead Channel Setting Sensing Sensitivity: 1.2 mV
Zone Setting Status: 755011

## 2024-01-29 NOTE — Progress Notes (Unsigned)
 Electrophysiology Clinic Note    Date:  01/30/2024  Patient ID:  Angela [name], Angela [name] 03/22/1971, MRN [id number] PCP:  Vicci Duwaine SQUIBB, DO  Cardiologist:  Redell Cave, MD   Electrophysiologist:  Soyla Gladis Norton, MD   Discussed the use of AI scribe software for clinical note transcription with the patient, who gave verbal consent to proceed.   Patient Profile    Chief Complaint: PPM gen change  History of Present Illness: Angela [name] is a 53 y.o. female with PMH notable for CHB s/p PPM, SVT / AVNRT, POTS, orthostatic intolerance, Atach, ; seen today for Will Gladis Norton, MD for routine electrophysiology follow-up s/p Pacemaker gen change.  She is s/p gen change 10/2023 by Dr. Norton, previously Dr. Fernande patient.   On follow-up today, she is doing well from a cardiac standpoint. She denies chest pain, chest pressure, palpitations. She drinks copious amounts of water to manage her POTS.  Her main complaint is ongoing sciatica   Arrhythmia/Device History Medtronic Dual Chamber PPM implanted 08/19/05 for CHB - gen change 2016, 2025    ROS:  Please see the history of present illness. All other systems are reviewed and otherwise negative.    Physical Exam    VS:  BP 122/62 (BP Location: Left Arm, Patient Position: Sitting, Cuff Size: Normal)   Pulse 77   Wt 132 lb 3.2 oz (60 kg)   LMP 09/10/2021 (Within Days)   SpO2 100%   BMI 22.69 kg/m  BMI: Body mass index is 22.69 kg/m.      Wt Readings from Last 3 Encounters:  01/30/24 132 lb 3.2 oz (60 kg)  12/30/23 132 lb 9.6 oz (60.1 kg)  11/18/23 138 lb (62.6 kg)     GEN- The patient is well appearing, alert and oriented x 3 today.   Lungs- Clear to ausculation bilaterally, normal work of breathing.  Heart- Regular rate and rhythm, no murmurs, rubs or gallops Extremities- No peripheral edema, warm, dry Skin-  device pocket well-healed, no tethering   Device interrogation done today and reviewed  by myself:  Battery 11 years Lead thresholds, impedence, sensing stable  No episodes No changes made today   Studies Reviewed   Previous EP, cardiology notes.    EKG is ordered. Personal review of EKG from today shows:    EKG Interpretation Date/Time:  Friday January 30 2024 13:38:25 EDT Ventricular Rate:  77 PR Interval:  198 QRS Duration:  154 QT Interval:  432 QTC Calculation: 488 R Axis:   89  Text Interpretation: Atrial-sensed ventricular-paced rhythm Confirmed by Miasha Emmons [phone]) on 01/30/2024 1:43:02 PM    TTE, 01/29/2023  1. Left ventricular ejection fraction, by estimation, is 55 to 60%. Left ventricular ejection fraction by 3D volume is 45 %. The left ventricle has normal function. The left ventricle has no regional wall motion abnormalities. Left ventricular diastolic parameters are indeterminate.   2. Right ventricular systolic function is normal. The right ventricular size is normal. There is normal pulmonary artery systolic pressure. The estimated right ventricular systolic pressure is 25.8 mmHg.   3. The mitral valve is normal in structure. No evidence of mitral valve regurgitation. No evidence of mitral stenosis.   4. Tricuspid valve regurgitation is mild to moderate.   5. The aortic valve is tricuspid. Aortic valve regurgitation is not visualized. No aortic stenosis is present.   6. The inferior vena cava is normal in size with greater than 50% respiratory variability, suggesting right  atrial pressure of 3 mmHg.    TTE, 03/03/2020 1. Left ventricular ejection fraction, by estimation, is 55 to 60%. The left ventricle has normal function. The left ventricle has no regional wall motion abnormalities. Left ventricular diastolic parameters are indeterminate.   2. Right ventricular systolic function is normal. The right ventricular size is normal. There is normal pulmonary artery systolic pressure.   3. Right atrial size was mildly dilated.   4. The mitral valve is  normal in structure. Trivial mitral valve regurgitation.   5. The tricuspid valve is abnormal. Tricuspid valve regurgitation is moderate.   6. The aortic valve is tricuspid. Aortic valve regurgitation is not visualized. No aortic stenosis is present.   7. The inferior vena cava is normal in size with greater than 50% respiratory variability, suggesting right atrial pressure of 3 mmHg.   Comparison(s): No significant change from prior study.    Assessment and Plan     #) CHB s/p PPM Device functioning well, see paceart for details High VP  #) POTS Symptoms well-managed with high sodium and increased PO fluid Continue physical activity as tolerated  #) SVT No recent episodes      Current medicines are reviewed at length with the patient today.   The patient does not have concerns regarding her medicines.  The following changes were made today:  none  Labs/ tests ordered today include:  Orders Placed This Encounter  Procedures   EKG 12-Lead     Disposition: Follow up with Dr. Inocencio or EP APP in 1-2 years . Continue remote monitoring   Signed, Tabria Steines, NP  01/30/24  2:06 PM  Electrophysiology [facility]

## 2024-01-30 ENCOUNTER — Encounter: Payer: Self-pay | Admitting: Cardiology

## 2024-01-30 ENCOUNTER — Encounter: Admitting: Pulmonary Disease

## 2024-01-30 ENCOUNTER — Ambulatory Visit: Attending: Cardiology | Admitting: Cardiology

## 2024-01-30 VITALS — BP 122/62 | HR 77 | Wt 132.2 lb

## 2024-01-30 DIAGNOSIS — Z950 Presence of cardiac pacemaker: Secondary | ICD-10-CM | POA: Diagnosis not present

## 2024-01-30 DIAGNOSIS — I471 Supraventricular tachycardia, unspecified: Secondary | ICD-10-CM

## 2024-01-30 DIAGNOSIS — I442 Atrioventricular block, complete: Secondary | ICD-10-CM | POA: Diagnosis not present

## 2024-01-30 DIAGNOSIS — G90A Postural orthostatic tachycardia syndrome (POTS): Secondary | ICD-10-CM | POA: Diagnosis not present

## 2024-01-30 LAB — CUP PACEART INCLINIC DEVICE CHECK
Date Time Interrogation Session: 20250829140948
Implantable Lead Connection Status: 753985
Implantable Lead Connection Status: 753985
Implantable Lead Implant Date: 20070319
Implantable Lead Implant Date: 20070319
Implantable Lead Location: 753859
Implantable Lead Location: 753860
Implantable Lead Model: 5076
Implantable Lead Model: 5076
Implantable Pulse Generator Implant Date: 20250528

## 2024-01-30 NOTE — Patient Instructions (Signed)
 Medication Instructions:  Your physician recommends that you continue on your current medications as directed. Please refer to the Current Medication list given to you today.   *If you need a refill on your cardiac medications before your next appointment, please call your pharmacy*  Lab Work: No labs ordered today  If you have labs (blood work) drawn today and your tests are completely normal, you will receive your results only by: MyChart Message (if you have MyChart) OR A paper copy in the mail If you have any lab test that is abnormal or we need to change your treatment, we will call you to review the results.  Testing/Procedures: No test ordered today   Follow-Up: At [facility], you and your health needs are our priority.  As part of our continuing mission to provide you with exceptional heart care, our providers are all part of one team.  This team includes your primary Cardiologist (physician) and Advanced Practice Providers or APPs (Physician Assistants and Nurse Practitioners) who all work together to provide you with the care you need, when you need it.  Your next appointment:   1 year(s)  Provider:   Suzann Riddle, NP or Dr Inocencio   We recommend signing up for the patient portal called MyChart.  Sign up information is provided on this After Visit Summary.  MyChart is used to connect with patients for Virtual Visits (Telemedicine).  Patients are able to view lab/test results, encounter notes, upcoming appointments, etc.  Non-urgent messages can be sent to your provider as well.   To learn more about what you can do with MyChart, go to [email or web address].

## 2024-02-05 ENCOUNTER — Ambulatory Visit: Payer: Self-pay | Admitting: Cardiology

## 2024-02-11 DIAGNOSIS — M5412 Radiculopathy, cervical region: Secondary | ICD-10-CM | POA: Diagnosis not present

## 2024-02-11 DIAGNOSIS — M5416 Radiculopathy, lumbar region: Secondary | ICD-10-CM | POA: Diagnosis not present

## 2024-02-12 ENCOUNTER — Other Ambulatory Visit: Payer: Self-pay | Admitting: Internal Medicine

## 2024-02-13 ENCOUNTER — Ambulatory Visit: Admitting: Family Medicine

## 2024-02-13 DIAGNOSIS — Z5321 Procedure and treatment not carried out due to patient leaving prior to being seen by health care provider: Secondary | ICD-10-CM

## 2024-02-13 NOTE — Progress Notes (Signed)
 Patient arrived on time for her appointment, but provider was running late and she had to leave before being seen. Appointment rescheduled for next week.

## 2024-02-16 NOTE — Progress Notes (Signed)
 Remote PPM Transmission

## 2024-02-19 ENCOUNTER — Encounter: Payer: Self-pay | Admitting: Family Medicine

## 2024-02-19 ENCOUNTER — Ambulatory Visit (INDEPENDENT_AMBULATORY_CARE_PROVIDER_SITE_OTHER): Admitting: Family Medicine

## 2024-02-19 VITALS — BP 110/75 | HR 91 | Temp 98.0°F | Ht 64.0 in | Wt 131.2 lb

## 2024-02-19 DIAGNOSIS — N951 Menopausal and female climacteric states: Secondary | ICD-10-CM | POA: Diagnosis not present

## 2024-02-19 DIAGNOSIS — Z23 Encounter for immunization: Secondary | ICD-10-CM

## 2024-02-19 DIAGNOSIS — F339 Major depressive disorder, recurrent, unspecified: Secondary | ICD-10-CM | POA: Diagnosis not present

## 2024-02-19 DIAGNOSIS — E559 Vitamin D deficiency, unspecified: Secondary | ICD-10-CM | POA: Diagnosis not present

## 2024-02-19 DIAGNOSIS — Z789 Other specified health status: Secondary | ICD-10-CM | POA: Diagnosis not present

## 2024-02-19 MED ORDER — VENLAFAXINE HCL ER 150 MG PO CP24
ORAL_CAPSULE | ORAL | 1 refills | Status: AC
Start: 2024-02-19 — End: ?

## 2024-02-19 NOTE — Progress Notes (Signed)
 Scheduled

## 2024-02-19 NOTE — Assessment & Plan Note (Signed)
 Under good control on current regimen. Continue current regimen. Continue to monitor. Call with any concerns. Refills given.

## 2024-02-19 NOTE — Progress Notes (Signed)
 BP 110/75 (BP Location: Left Arm, Patient Position: Sitting, Cuff Size: Normal)   Pulse 91   Temp 98 F (36.7 C) (Oral)   Ht 5' 4 (1.626 m)   Wt 131 lb 3.2 oz (59.5 kg)   LMP 09/10/2021 (Within Days)   SpO2 100%   BMI 22.52 kg/m    Subjective:    Patient ID: Angela [name], female    DOB: 1970/11/20, 53 y.o.   MRN: [id number]  HPI: Angela [name] is a 53 y.o. female  Chief Complaint  Patient presents with   perimenopause   MENOPAUSAL SYMPTOMS Duration: better Symptom severity: mild Hot flashes: no Night sweats: no Sleep disturbances: yes- having a lot of hip pain, seems to be the bigger issue Vaginal dryness: no Dyspareunia:no Decreased libido: yes Emotional lability: better Stress incontinence: no Previous HRT/pharmacotherapy: no Hysterectomy: no Absolute Contraindications to Hormonal Therapy:     Undiagnosed vaginal bleeding: no    Breast cancer: no    Endometrial cancer: no    Coronary disease: no    Cerebrovascular disease: no    Venous thromboembolic disease: no  DEPRESSION Mood status: controlled Satisfied with current treatment?: yes Symptom severity: mild  Duration of current treatment : chronic Side effects: no Medication compliance: excellent compliance Psychotherapy/counseling: no  Previous psychiatric medications: effexor  Depressed mood: no Anxious mood: no Anhedonia: no Significant weight loss or gain: no Insomnia: yes hard to stay asleep Fatigue: yes Feelings of worthlessness or guilt: no Impaired concentration/indecisiveness: no Suicidal ideations: no Hopelessness: no Crying spells: no    02/19/2024    8:22 AM 12/30/2023    9:15 AM 11/18/2023    1:22 PM 08/18/2023    9:12 AM 01/17/2023    3:13 PM  Depression screen PHQ 2/9  Decreased Interest 0 0 0 1 2  Down, Depressed, Hopeless 0 0 1 1 1   PHQ - 2 Score 0 0 1 2 3   Altered sleeping 3 2 1 3 3   Tired, decreased energy 1 1 3 2 2   Change in appetite 0 0 1 0 1  Feeling bad or  failure about yourself  0 0 0 0 1  Trouble concentrating 2 2 2 2 2   Moving slowly or fidgety/restless 2 1 0 2 1  Suicidal thoughts 0 0 0 0 0  PHQ-9 Score 8 6 8 11 13   Difficult doing work/chores Somewhat difficult Somewhat difficult Somewhat difficult  Very difficult    Relevant past medical, surgical, family and social history reviewed and updated as indicated. Interim medical history since our last visit reviewed. Allergies and medications reviewed and updated.  Review of Systems  Constitutional: Negative.   Respiratory: Negative.    Cardiovascular: Negative.   Musculoskeletal: Negative.   Skin: Negative.   Neurological: Negative.   Psychiatric/Behavioral: Negative.      Per HPI unless specifically indicated above     Objective:    BP 110/75 (BP Location: Left Arm, Patient Position: Sitting, Cuff Size: Normal)   Pulse 91   Temp 98 F (36.7 C) (Oral)   Ht 5' 4 (1.626 m)   Wt 131 lb 3.2 oz (59.5 kg)   LMP 09/10/2021 (Within Days)   SpO2 100%   BMI 22.52 kg/m   Wt Readings from Last 3 Encounters:  02/19/24 131 lb 3.2 oz (59.5 kg)  01/30/24 132 lb 3.2 oz (60 kg)  12/30/23 132 lb 9.6 oz (60.1 kg)    Physical Exam Vitals and nursing note reviewed.  Constitutional:  General: She is not in acute distress.    Appearance: Normal appearance. She is not ill-appearing, toxic-appearing or diaphoretic.  HENT:     Head: Normocephalic and atraumatic.     Right Ear: External ear normal.     Left Ear: External ear normal.     Nose: Nose normal.     Mouth/Throat:     Mouth: Mucous membranes are moist.     Pharynx: Oropharynx is clear.  Eyes:     General: No scleral icterus.       Right eye: No discharge.        Left eye: No discharge.     Extraocular Movements: Extraocular movements intact.     Conjunctiva/sclera: Conjunctivae normal.     Pupils: Pupils are equal, round, and reactive to light.  Cardiovascular:     Rate and Rhythm: Normal rate and regular rhythm.      Pulses: Normal pulses.     Heart sounds: Normal heart sounds. No murmur heard.    No friction rub. No gallop.  Pulmonary:     Effort: Pulmonary effort is normal. No respiratory distress.     Breath sounds: Normal breath sounds. No stridor. No wheezing, rhonchi or rales.  Chest:     Chest wall: No tenderness.  Musculoskeletal:        General: Normal range of motion.     Cervical back: Normal range of motion and neck supple.  Skin:    General: Skin is warm and dry.     Capillary Refill: Capillary refill takes less than 2 seconds.     Coloration: Skin is not jaundiced or pale.     Findings: No bruising, erythema, lesion or rash.  Neurological:     General: No focal deficit present.     Mental Status: She is alert and oriented to person, place, and time. Mental status is at baseline.  Psychiatric:        Mood and Affect: Mood normal.        Behavior: Behavior normal.        Thought Content: Thought content normal.        Judgment: Judgment normal.     Results for orders placed or performed in visit on 01/30/24  CUP PACEART [facility] DEVICE CHECK   Collection Time: 01/30/24  2:09 PM  Result Value Ref Range   Pulse Generator Manufacturer MERM    Date Time Interrogation Session [id number]    Pulse Gen Model [id number] Angela [name] DR MRI    Pulse Gen Serial Number [id number]    Clinic Name [facility]    Implantable Pulse Generator Type Implantable Pulse Generator    Implantable Pulse Generator Implant Date 79749471    Implantable Lead Manufacturer [facility]    Implantable Lead Model 5076 CapSureFix Novus    Implantable Lead Serial Number [id number]    Implantable Lead Implant Date 79929680    Implantable Lead Location Detail 1 APPENDAGE    Implantable Lead Location [id number]    Implantable Lead Connection Status [id number]    Implantable Lead Manufacturer [facility]    Implantable Lead Model 5076 CapSureFix Novus    Implantable Lead Serial Number [id number]    Implantable Lead Implant Date  79929680    Implantable Lead Location Detail 1 APEX    Implantable Lead Location [id number]    Implantable Lead Connection Status [id number]       Assessment & Plan:   Problem List Items Addressed This Visit       Other  Depression, recurrent (HCC)   Under good control on current regimen. Continue current regimen. Continue to monitor. Call with any concerns. Refills given.        Relevant Medications   venlafaxine  XR (EFFEXOR -XR) 150 MG 24 hr capsule   Perimenopause - Primary   Doing great on her combipatch ! Continue combipatch . Call with any concerns. Continue to monitor.       Other Visit Diagnoses       Vitamin D  deficiency       Rechecking labs today. Await resuts.   Relevant Orders   VITAMIN D  25 Hydroxy (Vit-D Deficiency, Fractures)     Hepatitis B vaccination status unknown       Will check labs. Await results.   Relevant Orders   Hepatitis B surface antibody,quantitative     Needs flu shot       Flu shot given today.   Relevant Orders   Flu vaccine trivalent PF, 6mos and older(Flulaval,Afluria,Fluarix,Fluzone) (Completed)        Follow up plan: Return in about 6 months (around 08/18/2024) for physical.

## 2024-02-19 NOTE — Assessment & Plan Note (Signed)
 Doing great on her combipatch ! Continue combipatch . Call with any concerns. Continue to monitor.

## 2024-02-20 LAB — VITAMIN D 25 HYDROXY (VIT D DEFICIENCY, FRACTURES): Vit D, 25-Hydroxy: 127 ng/mL — ABNORMAL HIGH (ref 30.0–100.0)

## 2024-02-20 LAB — HEPATITIS B SURFACE ANTIBODY, QUANTITATIVE: Hepatitis B Surf Ab Quant: <3.5 m[IU]/mL — ABNORMAL LOW

## 2024-02-23 ENCOUNTER — Ambulatory Visit: Payer: Self-pay | Admitting: Family Medicine

## 2024-02-24 DIAGNOSIS — M5416 Radiculopathy, lumbar region: Secondary | ICD-10-CM | POA: Diagnosis not present

## 2024-02-25 ENCOUNTER — Encounter: Payer: Self-pay | Admitting: Family Medicine

## 2024-02-25 DIAGNOSIS — Z79899 Other long term (current) drug therapy: Secondary | ICD-10-CM | POA: Diagnosis not present

## 2024-02-25 DIAGNOSIS — H11042 Peripheral pterygium, stationary, left eye: Secondary | ICD-10-CM | POA: Diagnosis not present

## 2024-02-25 DIAGNOSIS — M0560 Rheumatoid arthritis of unspecified site with involvement of other organs and systems: Secondary | ICD-10-CM | POA: Diagnosis not present

## 2024-02-25 DIAGNOSIS — H5213 Myopia, bilateral: Secondary | ICD-10-CM | POA: Diagnosis not present

## 2024-03-08 ENCOUNTER — Other Ambulatory Visit: Payer: Self-pay | Admitting: Family Medicine

## 2024-03-10 NOTE — Telephone Encounter (Signed)
 Requested Prescriptions  Pending Prescriptions Disp Refills   traZODone  (DESYREL ) 50 MG tablet [Pharmacy Med Name: TRAZODONE  50MG  TABLETS] 90 tablet 1    Sig: Take 0.5-1 tablets (25-50 mg total) by mouth at bedtime.     Psychiatry: Antidepressants - Serotonin Modulator Passed - 03/10/2024 12:51 PM      Passed - Completed PHQ-2 or PHQ-9 in the last 360 days      Passed - Valid encounter within last 6 months    Recent Outpatient Visits           2 weeks ago Perimenopause   [place] [facility] [place], Megan P, DO   3 weeks ago Patient left without being seen   [facility] [place], Megan P, DO   2 months ago Perimenopause   [place] [facility] [place], Megan P, DO   6 months ago Routine general medical examination at a health care facility   [facility], Megan P, DO

## 2024-03-19 ENCOUNTER — Other Ambulatory Visit: Payer: Self-pay | Admitting: Family Medicine

## 2024-03-19 DIAGNOSIS — F339 Major depressive disorder, recurrent, unspecified: Secondary | ICD-10-CM

## 2024-03-22 NOTE — Telephone Encounter (Signed)
 Requested medication (s) are due for refill today: yes  Requested medication (s) are on the active medication list: yes  Last refill:  08/13/23  Future visit scheduled: yes  Notes to clinic:  Unable to refill per protocol, cannot delegate.      Requested Prescriptions  Pending Prescriptions Disp Refills   LORazepam  (ATIVAN ) 0.5 MG tablet [Pharmacy Med Name: LORAZEPAM  0.5MG  TABLETS] 45 tablet     Sig: TAKE 1 TO 2 TABLETS(0.5 TO 1 MG) BY MOUTH DAILY AS NEEDED FOR ANXIETY     Not Delegated - Psychiatry: Anxiolytics/Hypnotics 2 Failed - 03/22/2024  4:21 PM      Failed - This refill cannot be delegated      Failed - Urine Drug Screen completed in last 360 days      Passed - Patient is not pregnant      Passed - Valid encounter within last 6 months    Recent Outpatient Visits           1 month ago Perimenopause   [place] [facility] [place], Megan P, DO   1 month ago Patient left without being seen   [facility] [place], Megan P, DO   2 months ago Perimenopause   [place] [facility] [place], Megan P, DO   7 months ago Routine general medical examination at a health care facility   [facility], Megan P, DO

## 2024-04-08 ENCOUNTER — Encounter: Payer: Self-pay | Admitting: Family Medicine

## 2024-04-08 ENCOUNTER — Telehealth: Payer: Self-pay | Admitting: Family Medicine

## 2024-04-08 ENCOUNTER — Encounter: Payer: Self-pay | Admitting: Cardiology

## 2024-04-11 NOTE — Telephone Encounter (Signed)
 Noted. Will not change combipatch  to pill

## 2024-04-28 ENCOUNTER — Ambulatory Visit

## 2024-04-28 DIAGNOSIS — I471 Supraventricular tachycardia, unspecified: Secondary | ICD-10-CM

## 2024-04-30 LAB — CUP PACEART REMOTE DEVICE CHECK
Battery Remaining Longevity: 144 mo
Battery Voltage: 3.17 V
Brady Statistic AP VP Percent: 1.33 %
Brady Statistic AP VS Percent: 0 %
Brady Statistic AS VP Percent: 98.66 %
Brady Statistic AS VS Percent: 0.01 %
Brady Statistic RA Percent Paced: 1.33 %
Brady Statistic RV Percent Paced: 99.99 %
Date Time Interrogation Session: 20251125224231
Implantable Lead Connection Status: 753985
Implantable Lead Connection Status: 753985
Implantable Lead Implant Date: 20070319
Implantable Lead Implant Date: 20070319
Implantable Lead Location: 753859
Implantable Lead Location: 753860
Implantable Lead Model: 5076
Implantable Lead Model: 5076
Implantable Pulse Generator Implant Date: 20250528
Lead Channel Impedance Value: 285 Ohm
Lead Channel Impedance Value: 399 Ohm
Lead Channel Impedance Value: 456 Ohm
Lead Channel Impedance Value: 513 Ohm
Lead Channel Pacing Threshold Amplitude: 1 V
Lead Channel Pacing Threshold Amplitude: 1 V
Lead Channel Pacing Threshold Pulse Width: 0.4 ms
Lead Channel Pacing Threshold Pulse Width: 0.4 ms
Lead Channel Sensing Intrinsic Amplitude: 2.25 mV
Lead Channel Sensing Intrinsic Amplitude: 2.25 mV
Lead Channel Sensing Intrinsic Amplitude: 3.125 mV
Lead Channel Sensing Intrinsic Amplitude: 3.125 mV
Lead Channel Setting Pacing Amplitude: 1.5 V
Lead Channel Setting Pacing Amplitude: 2 V
Lead Channel Setting Pacing Pulse Width: 0.4 ms
Lead Channel Setting Sensing Sensitivity: 1.2 mV
Zone Setting Status: 755011

## 2024-05-04 NOTE — Progress Notes (Signed)
 Remote PPM Transmission

## 2024-05-06 ENCOUNTER — Ambulatory Visit: Payer: Self-pay | Admitting: Cardiology

## 2024-05-10 DIAGNOSIS — M1990 Unspecified osteoarthritis, unspecified site: Secondary | ICD-10-CM | POA: Diagnosis not present

## 2024-05-10 DIAGNOSIS — Z79899 Other long term (current) drug therapy: Secondary | ICD-10-CM | POA: Diagnosis not present

## 2024-05-24 ENCOUNTER — Telehealth: Admitting: Family Medicine

## 2024-05-24 DIAGNOSIS — J019 Acute sinusitis, unspecified: Secondary | ICD-10-CM | POA: Diagnosis not present

## 2024-05-24 DIAGNOSIS — B9689 Other specified bacterial agents as the cause of diseases classified elsewhere: Secondary | ICD-10-CM

## 2024-05-24 MED ORDER — AMOXICILLIN-POT CLAVULANATE 875-125 MG PO TABS: 1.0000 | ORAL_TABLET | Freq: Two times a day (BID) | ORAL | 0 refills | Status: AC

## 2024-05-24 NOTE — Progress Notes (Signed)

## 2024-06-04 NOTE — Progress Notes (Signed)
 "  Cardiology Office Note    Date:  06/07/2024   ID:  Angela [name], Angela [name] 07-13-1970, MRN [id number]  PCP:  Vicci Duwaine SQUIBB, DO  Cardiologist:  Redell Cave, MD  Electrophysiologist:  Soyla Gladis Norton, MD   Chief Complaint: Follow up  History of Present Illness:   Angela [name] is a 54 y.o. female with history of PSVT/AVNRT s/p ablation, CHB s/p PPM, POTS, and hypertension who presents for follow-up on SVT and hypertension.    Patient had an ablation for PSVT/AVNRT which was complicated by complete heart block for which she underwent PPM and 2007.  She had a generator change 07/23/2014 and 10/21/2023.  Most recent echo 01/2023 with EF 55 to 60%, no RWMA, and mild to moderate tricuspid regurgitation.   Patient presents today overall doing well from a cardiac perspective.  She reports being more tired/fatigued recently but she suspects this is due to being busier around the holiday season.  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  She is followed out of the routine of exercising regularly but is motivated to start going to the gym again.  She has POTS and has intermittent lightheadedness but reports that she feels the best that she has not several years.  She denies exertional chest pain and dyspnea.  No palpitations or lower extremity swelling.  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 EF 50-55% normal coronaries   Complete heart block (HCC)    Depression    Dysautonomia (HCC)    Dyspnea    CT negative for PE, 2007   Hypertension    Insomnia    OCD (obsessive compulsive  disorder)    Presence of permanent cardiac pacemaker    SVT (supraventricular tachycardia)    s/p ablation a. c/b AV nod ablation requiring pacemaker    Current Medications: Active Medications[1]  Allergies:   Toprol  xl [metoprolol  succinate]   Social History   Socioeconomic History   Marital status: Married    Spouse name: Darin   Number of children: 2   Years of education: Not on file   Highest education level: Bachelor's degree (e.g., BA, AB, BS)  Occupational History   Occupation: disability   Tobacco Use   Smoking status: Never   Smokeless tobacco: Never  Vaping Use   Vaping status: Never Used  Substance and Sexual Activity   Alcohol use: No   Drug use: No   Sexual activity: Yes    Birth control/protection: Surgical    Comment: Uterine ablarion 2002  Other Topics Concern   Not on file  Social History Narrative   Work with daughters school    Social Drivers of Health   Tobacco Use: Low Risk (06/07/2024)   Patient History    Smoking Tobacco Use: Never    Smokeless Tobacco Use: Never    Passive Exposure: Not on file  Financial Resource Strain: Low Risk (02/09/2024)   Overall Financial Resource Strain (CARDIA)    Difficulty of Paying Living Expenses: Not very hard  Food Insecurity: No  Food Insecurity (02/09/2024)   Epic    Worried About Programme Researcher, Broadcasting/film/video in the Last Year: Never true    Ran Out of Food in the Last Year: Never true  Transportation Needs: No Transportation Needs (02/09/2024)   Epic    Lack of Transportation (Medical): No    Lack of Transportation (Non-Medical): No  Physical Activity: Inactive (02/09/2024)   Exercise Vital Sign    Days of Exercise per Week: 0 days    Minutes of Exercise per Session: Not on file  Stress: Stress Concern Present (02/09/2024)   [employer] of Occupational Health - Occupational Stress Questionnaire    Feeling of Stress: To some extent  Social Connections: Socially Integrated (02/09/2024)   Social Connection and  Isolation Panel    Frequency of Communication with Friends and Family: More than three times a week    Frequency of Social Gatherings with Friends and Family: Once a week    Attends Religious Services: More than 4 times per year    Active Member of Clubs or Organizations: Yes    Attends Banker Meetings: More than 4 times per year    Marital Status: Married  Depression (PHQ2-9): Medium Risk (02/19/2024)   Depression (PHQ2-9)    PHQ-2 Score: 8  Alcohol Screen: Low Risk (11/18/2023)   Alcohol Screen    Last Alcohol Screening Score (AUDIT): 0  Housing: Low Risk (02/09/2024)   Epic    Unable to Pay for Housing in the Last Year: No    Number of Times Moved in the Last Year: 0    Homeless in the Last Year: No  Utilities: Not At Risk (11/18/2023)   Epic    Threatened with loss of utilities: No  Health Literacy: Adequate Health Literacy (11/18/2023)   B1300 Health Literacy    Frequency of need for help with medical instructions: Never     Family History:  The patient's family history includes Anxiety disorder in her mother, sister, and sister; Arthritis in her mother and paternal grandmother; Breast cancer in her maternal aunt, paternal aunt, paternal aunt, and paternal grandmother; Cancer in her maternal aunt, mother, paternal aunt, paternal grandfather, and paternal grandmother; Cancer (age of onset: 71) in her father; Cervical cancer in her mother; Dementia in her maternal grandmother; Diabetes in her father, paternal grandfather, and paternal grandmother; Hypertension in her father and maternal grandfather; Obesity in her mother.  ROS:   12-point review of systems is negative unless otherwise noted in the HPI.  EKGs/Other Studies Reviewed:    Studies reviewed were summarized above. The additional studies were reviewed today:  01/2023 2D echo 1. Left ventricular ejection fraction, by estimation, is 55 to 60%. Left  ventricular ejection fraction by 3D volume is 45 %. The left  ventricle has  normal function. The left ventricle has no regional wall motion  abnormalities. Left ventricular diastolic   parameters are indeterminate.   2. Right ventricular systolic function is normal. The right ventricular  size is normal. There is normal pulmonary artery systolic pressure. The  estimated right ventricular systolic pressure is 25.8 mmHg.   3. The mitral valve is normal in structure. No evidence of mitral valve  regurgitation. No evidence of mitral stenosis.   4. Tricuspid valve regurgitation is mild to moderate.   5. The aortic valve is tricuspid. Aortic valve regurgitation is not  visualized. No aortic stenosis is present.   6. The inferior vena cava is normal in size with greater than 50%  respiratory variability, suggesting right atrial pressure of 3 mmHg.   EKG:  EKG personally reviewed by me today    PHYSICAL EXAM:    VS:  BP 126/88 (BP Location: Left Arm, Patient Position: Sitting, Cuff Size: Normal)   Pulse 83   Ht 5' 4 (1.626 m)   Wt 127 lb 2 oz (57.7 kg)   LMP 09/10/2021   SpO2 96%   BMI 21.82 kg/m   BMI: Body mass index is 21.82 kg/m.  GEN: Well nourished, well developed in no acute distress NECK: No JVD; No carotid bruits CARDIAC: RRR, no murmurs, rubs, gallops RESPIRATORY:  Clear to auscultation without rales, wheezing or rhonchi  ABDOMEN: Soft, non-tender, non-distended EXTREMITIES: No edema; No deformity  Wt Readings from Last 3 Encounters:  06/07/24 127 lb 2 oz (57.7 kg)  02/19/24 131 lb 3.2 oz (59.5 kg)  01/30/24 132 lb 3.2 oz (60 kg)                  ASSESSMENT & PLAN:   Hypertension - BP mildly elevated today.  Well-controlled per at home readings.  She is continued on Cardizem  CD 120 mg daily.  POTS - Good symptomatic management.  Recommend regular physical activity.  Paroxysmal SVT - Asymptomatic on current dose of Cardizem .  Complete heart block s/p PPM - Device functioning well on last check.  Follows with  EP.    Disposition: F/u with Dr. Darliss or an APP in 1 year.   Medication Adjustments/Labs and Tests Ordered: Current medicines are reviewed at length with the patient today.  Concerns regarding medicines are outlined above. Medication changes, Labs and Tests ordered today are summarized above and listed in the Patient Instructions accessible in Encounters.   Bonney Lesley Maffucci, PA-C 06/07/2024 10:47 AM     [facility] [address] [place], [place] [address] [phone]      [1]  Current Meds  Medication Sig   cyclobenzaprine  (FLEXERIL ) 5 MG tablet Take 1 tablet (5 mg total) by mouth 3 (three) times daily as needed for muscle spasms.   diltiazem  (CARDIZEM  CD) 120 MG 24 hr capsule TAKE 1 CAPSULE(120 MG) BY MOUTH DAILY   docusate sodium  (COLACE) 100 MG capsule Take 1 capsule (100 mg total) by mouth 2 (two) times daily as needed for mild constipation.   estradiol -norethindrone (COMBIPATCH ) 0.05-0.14 MG/DAY Place 1 patch onto the skin 2 (two) times a week.   FIBER ADULT GUMMIES PO Take 2 each by mouth daily. Vita Fusion Fiberwell   gabapentin (NEURONTIN) 300 MG capsule Take 300 mg by mouth 2 (two) times daily. (Patient taking differently: Take 300 mg by mouth 2 (two) times daily. 200 mg twice a day)   hydroxychloroquine  (PLAQUENIL ) 200 MG tablet Take 150 mg by mouth daily.   LORazepam  (ATIVAN ) 0.5 MG tablet TAKE 1 TO 2 TABLETS(0.5 TO 1 MG) BY MOUTH DAILY AS NEEDED FOR ANXIETY   Multiple Vitamins-Minerals (MULTIVITAMIN WITH MINERALS) tablet Take 1 tablet by mouth daily.   tirzepatide 7.5 MG/0.5ML injection vial Inject 7.5 mg into the skin once a week. (Patient taking differently: Inject 7.5 mg into the skin once a week. Taking every 12 days)   traZODone  (DESYREL ) 50 MG tablet Take 0.5-1 tablets (25-50 mg total) by mouth at bedtime.   venlafaxine  XR (EFFEXOR -XR) 150 MG 24 hr capsule TAKE 1 CAPSULE(150 MG) BY MOUTH DAILY WITH BREAKFAST   "

## 2024-06-07 ENCOUNTER — Ambulatory Visit: Attending: Physician Assistant | Admitting: Physician Assistant

## 2024-06-07 ENCOUNTER — Encounter: Payer: Self-pay | Admitting: Physician Assistant

## 2024-06-07 VITALS — BP 126/88 | HR 83 | Ht 64.0 in | Wt 127.1 lb

## 2024-06-07 DIAGNOSIS — Z950 Presence of cardiac pacemaker: Secondary | ICD-10-CM | POA: Diagnosis not present

## 2024-06-07 DIAGNOSIS — I442 Atrioventricular block, complete: Secondary | ICD-10-CM | POA: Diagnosis not present

## 2024-06-07 DIAGNOSIS — I10 Essential (primary) hypertension: Secondary | ICD-10-CM

## 2024-06-07 DIAGNOSIS — I471 Supraventricular tachycardia, unspecified: Secondary | ICD-10-CM

## 2024-06-07 DIAGNOSIS — G90A Postural orthostatic tachycardia syndrome (POTS): Secondary | ICD-10-CM

## 2024-06-07 NOTE — Patient Instructions (Signed)

## 2024-07-02 ENCOUNTER — Ambulatory Visit: Admitting: Family Medicine

## 2024-07-02 ENCOUNTER — Encounter: Payer: Self-pay | Admitting: Family Medicine

## 2024-07-02 ENCOUNTER — Other Ambulatory Visit: Payer: Self-pay | Admitting: Family Medicine

## 2024-07-02 VITALS — Resp 17 | Ht 64.02 in | Wt 128.8 lb

## 2024-07-02 DIAGNOSIS — R41840 Attention and concentration deficit: Secondary | ICD-10-CM | POA: Diagnosis not present

## 2024-07-02 DIAGNOSIS — F339 Major depressive disorder, recurrent, unspecified: Secondary | ICD-10-CM

## 2024-07-05 ENCOUNTER — Ambulatory Visit: Admitting: Family Medicine

## 2024-07-05 NOTE — Telephone Encounter (Signed)
 Requested medication (s) are due for refill today - yes  Requested medication (s) are on the active medication list -yes  Future visit scheduled -yes  Last refill: 03/23/24 #45  Notes to clinic: non delegated Rx  Requested Prescriptions  Pending Prescriptions Disp Refills   LORazepam  (ATIVAN ) 0.5 MG tablet [Pharmacy Med Name: LORAZEPAM  0.5MG  TABLETS] 45 tablet     Sig: TAKE 1 TO 2 TABLETS(0.5 TO 1 MG) BY MOUTH DAILY AS NEEDED FOR ANXIETY     Not Delegated - Psychiatry: Anxiolytics/Hypnotics 2 Failed - 07/05/2024  2:05 PM      Failed - This refill cannot be delegated      Failed - Urine Drug Screen completed in last 360 days      Passed - Patient is not pregnant      Passed - Valid encounter within last 6 months    Recent Outpatient Visits           3 days ago Attention and concentration deficit   [place] [facility] [place], Megan P, DO   4 months ago Perimenopause   [place] [facility] [place], Megan P, DO   4 months ago Patient left without being seen   [place] [facility] [place], Megan P, DO   6 months ago Perimenopause   [place]  [facility], Megan P, DO   10 months ago Routine general medical examination at a health care facility   [facility] [place] P, DO                 Requested Prescriptions  Pending Prescriptions Disp Refills   LORazepam  (ATIVAN ) 0.5 MG tablet [Pharmacy Med Name: LORAZEPAM  0.5MG  TABLETS] 45 tablet     Sig: TAKE 1 TO 2 TABLETS(0.5 TO 1 MG) BY MOUTH DAILY AS NEEDED FOR ANXIETY     Not Delegated - Psychiatry: Anxiolytics/Hypnotics 2 Failed - 07/05/2024  2:05 PM      Failed - This refill cannot be delegated      Failed - Urine Drug Screen completed in last 360 days      Passed - Patient is not pregnant      Passed - Valid encounter within last 6 months    Recent Outpatient Visits           3 days ago Attention and concentration  deficit   [place] [facility] [place], Megan P, DO   4 months ago Perimenopause   [place] [facility] [place], [place], DO   4 months ago Patient left without being seen   [facility] [place], Megan P, DO   6 months ago Perimenopause   [place] [facility] [place], Megan P, DO   10 months ago Routine general medical examination at a health care facility   [facility] [place], Megan P, DO

## 2024-07-13 ENCOUNTER — Ambulatory Visit: Admitting: Family Medicine

## 2024-07-28 ENCOUNTER — Encounter

## 2024-08-23 ENCOUNTER — Encounter: Admitting: Family Medicine

## 2024-08-30 ENCOUNTER — Ambulatory Visit: Admitting: Family Medicine

## 2024-10-27 ENCOUNTER — Encounter

## 2024-11-23 ENCOUNTER — Ambulatory Visit

## 2025-01-26 ENCOUNTER — Encounter

## 2025-04-27 ENCOUNTER — Encounter

## 2025-07-27 ENCOUNTER — Encounter

## 2025-10-26 ENCOUNTER — Encounter
